# Patient Record
Sex: Female | Born: 1940 | Race: White | Hispanic: No | Smoking: Never smoker
Health system: Southern US, Community
[De-identification: ages and names within clinical notes are randomized; demographics above are authoritative.]

## PROBLEM LIST (undated history)

## (undated) DIAGNOSIS — M1990 Unspecified osteoarthritis, unspecified site: Secondary | ICD-10-CM

## (undated) DIAGNOSIS — R519 Headache, unspecified: Secondary | ICD-10-CM

## (undated) DIAGNOSIS — M519 Unspecified thoracic, thoracolumbar and lumbosacral intervertebral disc disorder: Secondary | ICD-10-CM

## (undated) DIAGNOSIS — M5432 Sciatica, left side: Secondary | ICD-10-CM

## (undated) DIAGNOSIS — E041 Nontoxic single thyroid nodule: Secondary | ICD-10-CM

## (undated) DIAGNOSIS — J309 Allergic rhinitis, unspecified: Secondary | ICD-10-CM

## (undated) DIAGNOSIS — Z87440 Personal history of urinary (tract) infections: Secondary | ICD-10-CM

## (undated) DIAGNOSIS — Z8042 Family history of malignant neoplasm of prostate: Secondary | ICD-10-CM

## (undated) DIAGNOSIS — R0600 Dyspnea, unspecified: Secondary | ICD-10-CM

## (undated) DIAGNOSIS — E785 Hyperlipidemia, unspecified: Secondary | ICD-10-CM

## (undated) DIAGNOSIS — Z803 Family history of malignant neoplasm of breast: Secondary | ICD-10-CM

## (undated) DIAGNOSIS — K589 Irritable bowel syndrome without diarrhea: Secondary | ICD-10-CM

## (undated) DIAGNOSIS — G2581 Restless legs syndrome: Secondary | ICD-10-CM

## (undated) DIAGNOSIS — IMO0002 Reserved for concepts with insufficient information to code with codable children: Secondary | ICD-10-CM

## (undated) DIAGNOSIS — I8392 Asymptomatic varicose veins of left lower extremity: Secondary | ICD-10-CM

## (undated) DIAGNOSIS — G8929 Other chronic pain: Secondary | ICD-10-CM

## (undated) DIAGNOSIS — Z808 Family history of malignant neoplasm of other organs or systems: Secondary | ICD-10-CM

## (undated) DIAGNOSIS — M419 Scoliosis, unspecified: Secondary | ICD-10-CM

## (undated) DIAGNOSIS — J45909 Unspecified asthma, uncomplicated: Secondary | ICD-10-CM

## (undated) DIAGNOSIS — C801 Malignant (primary) neoplasm, unspecified: Secondary | ICD-10-CM

## (undated) DIAGNOSIS — M5431 Sciatica, right side: Secondary | ICD-10-CM

## (undated) DIAGNOSIS — M5136 Other intervertebral disc degeneration, lumbar region: Secondary | ICD-10-CM

## (undated) DIAGNOSIS — R51 Headache: Secondary | ICD-10-CM

## (undated) DIAGNOSIS — Z9109 Other allergy status, other than to drugs and biological substances: Secondary | ICD-10-CM

## (undated) DIAGNOSIS — M810 Age-related osteoporosis without current pathological fracture: Secondary | ICD-10-CM

## (undated) DIAGNOSIS — R42 Dizziness and giddiness: Secondary | ICD-10-CM

## (undated) DIAGNOSIS — F419 Anxiety disorder, unspecified: Secondary | ICD-10-CM

## (undated) DIAGNOSIS — K219 Gastro-esophageal reflux disease without esophagitis: Secondary | ICD-10-CM

## (undated) DIAGNOSIS — J189 Pneumonia, unspecified organism: Secondary | ICD-10-CM

## (undated) DIAGNOSIS — J40 Bronchitis, not specified as acute or chronic: Secondary | ICD-10-CM

## (undated) DIAGNOSIS — N189 Chronic kidney disease, unspecified: Secondary | ICD-10-CM

## (undated) DIAGNOSIS — I10 Essential (primary) hypertension: Secondary | ICD-10-CM

## (undated) DIAGNOSIS — R1310 Dysphagia, unspecified: Secondary | ICD-10-CM

## (undated) DIAGNOSIS — M4800 Spinal stenosis, site unspecified: Secondary | ICD-10-CM

## (undated) DIAGNOSIS — Z972 Presence of dental prosthetic device (complete) (partial): Secondary | ICD-10-CM

## (undated) DIAGNOSIS — R7881 Bacteremia: Secondary | ICD-10-CM

## (undated) DIAGNOSIS — M51369 Other intervertebral disc degeneration, lumbar region without mention of lumbar back pain or lower extremity pain: Secondary | ICD-10-CM

## (undated) DIAGNOSIS — Z923 Personal history of irradiation: Secondary | ICD-10-CM

## (undated) DIAGNOSIS — K635 Polyp of colon: Secondary | ICD-10-CM

## (undated) DIAGNOSIS — B9620 Unspecified Escherichia coli [E. coli] as the cause of diseases classified elsewhere: Secondary | ICD-10-CM

## (undated) DIAGNOSIS — Z77120 Contact with and (suspected) exposure to mold (toxic): Secondary | ICD-10-CM

## (undated) DIAGNOSIS — D649 Anemia, unspecified: Secondary | ICD-10-CM

## (undated) DIAGNOSIS — E039 Hypothyroidism, unspecified: Secondary | ICD-10-CM

## (undated) DIAGNOSIS — Z800 Family history of malignant neoplasm of digestive organs: Secondary | ICD-10-CM

## (undated) DIAGNOSIS — M549 Dorsalgia, unspecified: Secondary | ICD-10-CM

## (undated) DIAGNOSIS — Z8041 Family history of malignant neoplasm of ovary: Secondary | ICD-10-CM

## (undated) HISTORY — DX: Unspecified osteoarthritis, unspecified site: M19.90

## (undated) HISTORY — DX: Other chronic pain: G89.29

## (undated) HISTORY — DX: Nontoxic single thyroid nodule: E04.1

## (undated) HISTORY — DX: Age-related osteoporosis without current pathological fracture: M81.0

## (undated) HISTORY — DX: Family history of malignant neoplasm of ovary: Z80.41

## (undated) HISTORY — PX: BREAST CYST ASPIRATION: SHX578

## (undated) HISTORY — DX: Essential (primary) hypertension: I10

## (undated) HISTORY — PX: CARDIOVASCULAR STRESS TEST: SHX262

## (undated) HISTORY — DX: Family history of malignant neoplasm of other organs or systems: Z80.8

## (undated) HISTORY — DX: Reserved for concepts with insufficient information to code with codable children: IMO0002

## (undated) HISTORY — DX: Gastro-esophageal reflux disease without esophagitis: K21.9

## (undated) HISTORY — PX: BIOPSY THYROID: PRO38

## (undated) HISTORY — DX: Polyp of colon: K63.5

## (undated) HISTORY — DX: Family history of malignant neoplasm of prostate: Z80.42

## (undated) HISTORY — PX: OVARIAN CYST SURGERY: SHX726

## (undated) HISTORY — DX: Presence of dental prosthetic device (complete) (partial): Z97.2

## (undated) HISTORY — DX: Dorsalgia, unspecified: M54.9

## (undated) HISTORY — PX: TOTAL THYROIDECTOMY: SHX2547

## (undated) HISTORY — PX: APPENDECTOMY: SHX54

## (undated) HISTORY — DX: Allergic rhinitis, unspecified: J30.9

## (undated) HISTORY — PX: EYE SURGERY: SHX253

## (undated) HISTORY — DX: Personal history of urinary (tract) infections: Z87.440

## (undated) HISTORY — DX: Family history of malignant neoplasm of breast: Z80.3

## (undated) HISTORY — PX: BACK SURGERY: SHX140

## (undated) HISTORY — DX: Unspecified Escherichia coli (E. coli) as the cause of diseases classified elsewhere: B96.20

## (undated) HISTORY — DX: Irritable bowel syndrome, unspecified: K58.9

## (undated) HISTORY — DX: Unspecified asthma, uncomplicated: J45.909

## (undated) HISTORY — DX: Hyperlipidemia, unspecified: E78.5

## (undated) HISTORY — DX: Dizziness and giddiness: R42

## (undated) HISTORY — DX: Unspecified thoracic, thoracolumbar and lumbosacral intervertebral disc disorder: M51.9

## (undated) HISTORY — DX: Family history of malignant neoplasm of digestive organs: Z80.0

## (undated) HISTORY — DX: Bronchitis, not specified as acute or chronic: J40

## (undated) HISTORY — DX: Bacteremia: R78.81

---

## 1975-03-18 HISTORY — PX: ABDOMINAL HYSTERECTOMY: SHX81

## 1976-03-17 DIAGNOSIS — J189 Pneumonia, unspecified organism: Secondary | ICD-10-CM

## 1976-03-17 HISTORY — DX: Pneumonia, unspecified organism: J18.9

## 1992-03-17 HISTORY — PX: OTHER SURGICAL HISTORY: SHX169

## 2000-03-17 DIAGNOSIS — Z77120 Contact with and (suspected) exposure to mold (toxic): Secondary | ICD-10-CM

## 2000-03-17 HISTORY — DX: Contact with and (suspected) exposure to mold (toxic): Z77.120

## 2001-03-17 HISTORY — PX: ANKLE FRACTURE SURGERY: SHX122

## 2004-06-20 ENCOUNTER — Ambulatory Visit: Payer: Self-pay | Admitting: Unknown Physician Specialty

## 2005-07-24 ENCOUNTER — Ambulatory Visit: Payer: Self-pay | Admitting: Unknown Physician Specialty

## 2005-08-18 ENCOUNTER — Ambulatory Visit: Payer: Self-pay | Admitting: Gastroenterology

## 2006-08-13 ENCOUNTER — Ambulatory Visit: Payer: Self-pay | Admitting: Unknown Physician Specialty

## 2006-10-20 ENCOUNTER — Ambulatory Visit: Payer: Self-pay | Admitting: Internal Medicine

## 2006-12-23 ENCOUNTER — Ambulatory Visit: Payer: Self-pay | Admitting: Internal Medicine

## 2007-01-06 ENCOUNTER — Ambulatory Visit: Payer: Self-pay | Admitting: Physician Assistant

## 2007-01-14 ENCOUNTER — Ambulatory Visit: Payer: Self-pay | Admitting: Anesthesiology

## 2007-02-24 ENCOUNTER — Ambulatory Visit: Payer: Self-pay | Admitting: Anesthesiology

## 2007-04-13 ENCOUNTER — Ambulatory Visit: Payer: Self-pay | Admitting: Anesthesiology

## 2007-04-20 ENCOUNTER — Encounter: Payer: Self-pay | Admitting: General Practice

## 2007-05-26 ENCOUNTER — Ambulatory Visit: Payer: Self-pay | Admitting: Anesthesiology

## 2007-05-27 ENCOUNTER — Ambulatory Visit: Payer: Self-pay | Admitting: Unknown Physician Specialty

## 2007-08-02 ENCOUNTER — Ambulatory Visit: Payer: Self-pay | Admitting: Anesthesiology

## 2007-08-16 ENCOUNTER — Ambulatory Visit: Payer: Self-pay | Admitting: Unknown Physician Specialty

## 2007-10-07 ENCOUNTER — Ambulatory Visit: Payer: Self-pay | Admitting: Unknown Physician Specialty

## 2008-03-17 HISTORY — PX: TRANSTHORACIC ECHOCARDIOGRAM: SHX275

## 2008-03-17 HISTORY — PX: COLONOSCOPY: SHX174

## 2008-03-17 HISTORY — PX: OTHER SURGICAL HISTORY: SHX169

## 2008-07-20 ENCOUNTER — Ambulatory Visit: Payer: Self-pay | Admitting: Unknown Physician Specialty

## 2008-08-02 ENCOUNTER — Ambulatory Visit: Payer: Self-pay | Admitting: Unknown Physician Specialty

## 2008-08-24 ENCOUNTER — Ambulatory Visit: Payer: Self-pay | Admitting: Unknown Physician Specialty

## 2009-06-28 ENCOUNTER — Ambulatory Visit: Payer: Self-pay | Admitting: Unknown Physician Specialty

## 2009-08-20 ENCOUNTER — Ambulatory Visit (HOSPITAL_COMMUNITY): Admission: RE | Admit: 2009-08-20 | Discharge: 2009-08-21 | Payer: Self-pay | Admitting: Surgery

## 2009-08-20 ENCOUNTER — Encounter (INDEPENDENT_AMBULATORY_CARE_PROVIDER_SITE_OTHER): Payer: Self-pay | Admitting: Surgery

## 2009-10-25 ENCOUNTER — Ambulatory Visit: Payer: Self-pay | Admitting: Unknown Physician Specialty

## 2009-11-05 ENCOUNTER — Ambulatory Visit: Payer: Self-pay | Admitting: Internal Medicine

## 2009-11-13 ENCOUNTER — Ambulatory Visit: Payer: Self-pay | Admitting: Internal Medicine

## 2010-06-03 LAB — CBC
HCT: 42.3 % (ref 36.0–46.0)
Hemoglobin: 14.1 g/dL (ref 12.0–15.0)
MCHC: 33.4 g/dL (ref 30.0–36.0)
MCV: 88.1 fL (ref 78.0–100.0)
Platelets: 180 10*3/uL (ref 150–400)
RBC: 4.8 MIL/uL (ref 3.87–5.11)
RDW: 13.9 % (ref 11.5–15.5)
WBC: 5.6 10*3/uL (ref 4.0–10.5)

## 2010-06-03 LAB — URINALYSIS, ROUTINE W REFLEX MICROSCOPIC
Bilirubin Urine: NEGATIVE
Glucose, UA: NEGATIVE mg/dL
Hgb urine dipstick: NEGATIVE
Ketones, ur: NEGATIVE mg/dL
Nitrite: NEGATIVE
Protein, ur: NEGATIVE mg/dL
Specific Gravity, Urine: 1.012 (ref 1.005–1.030)
Urobilinogen, UA: 0.2 mg/dL (ref 0.0–1.0)
pH: 6 (ref 5.0–8.0)

## 2010-06-03 LAB — DIFFERENTIAL
Basophils Absolute: 0.1 10*3/uL (ref 0.0–0.1)
Basophils Relative: 1 % (ref 0–1)
Eosinophils Absolute: 0.2 10*3/uL (ref 0.0–0.7)
Eosinophils Relative: 3 % (ref 0–5)
Lymphocytes Relative: 30 % (ref 12–46)
Lymphs Abs: 1.7 10*3/uL (ref 0.7–4.0)
Monocytes Absolute: 0.5 10*3/uL (ref 0.1–1.0)
Monocytes Relative: 9 % (ref 3–12)
Neutro Abs: 3.2 10*3/uL (ref 1.7–7.7)
Neutrophils Relative %: 57 % (ref 43–77)

## 2010-06-03 LAB — BASIC METABOLIC PANEL
BUN: 19 mg/dL (ref 6–23)
CO2: 28 mEq/L (ref 19–32)
Calcium: 9.4 mg/dL (ref 8.4–10.5)
Chloride: 109 mEq/L (ref 96–112)
Creatinine, Ser: 0.83 mg/dL (ref 0.4–1.2)
GFR calc Af Amer: >60 mL/min (ref >60)
GFR calc non Af Amer: >60 mL/min (ref >60)
Glucose, Bld: 91 mg/dL (ref 70–99)
Potassium: 3.9 mEq/L (ref 3.5–5.1)
Sodium: 144 mEq/L (ref 135–145)

## 2010-06-03 LAB — PROTIME-INR
INR: 1.11 (ref 0.00–1.49)
Prothrombin Time: 14.2 seconds (ref 11.6–15.2)

## 2010-06-03 LAB — CALCIUM
Calcium: 8.3 mg/dL — ABNORMAL LOW (ref 8.4–10.5)
Calcium: 8.5 mg/dL (ref 8.4–10.5)

## 2010-07-05 ENCOUNTER — Ambulatory Visit: Payer: Self-pay | Admitting: Internal Medicine

## 2010-07-16 ENCOUNTER — Ambulatory Visit: Payer: Self-pay | Admitting: Internal Medicine

## 2010-08-22 ENCOUNTER — Encounter (INDEPENDENT_AMBULATORY_CARE_PROVIDER_SITE_OTHER): Payer: Self-pay | Admitting: Surgery

## 2010-10-31 ENCOUNTER — Ambulatory Visit: Payer: Self-pay | Admitting: Unknown Physician Specialty

## 2010-11-04 ENCOUNTER — Ambulatory Visit: Payer: Self-pay | Admitting: Internal Medicine

## 2010-12-05 ENCOUNTER — Ambulatory Visit: Payer: Self-pay | Admitting: Internal Medicine

## 2011-03-12 ENCOUNTER — Ambulatory Visit: Payer: Self-pay | Admitting: Internal Medicine

## 2011-05-01 ENCOUNTER — Encounter (INDEPENDENT_AMBULATORY_CARE_PROVIDER_SITE_OTHER): Payer: Self-pay | Admitting: Surgery

## 2011-08-13 ENCOUNTER — Ambulatory Visit: Payer: Self-pay | Admitting: Unknown Physician Specialty

## 2011-10-30 ENCOUNTER — Other Ambulatory Visit: Payer: Self-pay | Admitting: Neurosurgery

## 2011-11-04 ENCOUNTER — Encounter (HOSPITAL_COMMUNITY): Payer: Self-pay | Admitting: Respiratory Therapy

## 2011-11-07 ENCOUNTER — Ambulatory Visit: Payer: Self-pay | Admitting: Unknown Physician Specialty

## 2011-11-12 ENCOUNTER — Encounter (HOSPITAL_COMMUNITY)
Admission: RE | Admit: 2011-11-12 | Discharge: 2011-11-12 | Disposition: A | Discharge status: Home / Self Care | Payer: Medicare Other | Source: Ambulatory Visit | Attending: Neurosurgery | Admitting: Neurosurgery

## 2011-11-12 ENCOUNTER — Encounter (HOSPITAL_COMMUNITY): Payer: Self-pay

## 2011-11-12 HISTORY — DX: Hypothyroidism, unspecified: E03.9

## 2011-11-12 HISTORY — DX: Malignant (primary) neoplasm, unspecified: C80.1

## 2011-11-12 LAB — CBC
HCT: 39.9 % (ref 36.0–46.0)
Hemoglobin: 13.2 g/dL (ref 12.0–15.0)
MCH: 30.6 pg (ref 26.0–34.0)
MCHC: 33.1 g/dL (ref 30.0–36.0)
MCV: 92.6 fL (ref 78.0–100.0)
Platelets: 178 10*3/uL (ref 150–400)
RBC: 4.31 MIL/uL (ref 3.87–5.11)
RDW: 14.1 % (ref 11.5–15.5)
WBC: 6.9 10*3/uL (ref 4.0–10.5)

## 2011-11-12 LAB — BASIC METABOLIC PANEL
BUN: 17 mg/dL (ref 6–23)
CO2: 27 mEq/L (ref 19–32)
Calcium: 9.5 mg/dL (ref 8.4–10.5)
Chloride: 107 mEq/L (ref 96–112)
Creatinine, Ser: 0.83 mg/dL (ref 0.50–1.10)
GFR calc Af Amer: 82 mL/min — ABNORMAL LOW (ref >90)
GFR calc non Af Amer: 70 mL/min — ABNORMAL LOW (ref >90)
Glucose, Bld: 81 mg/dL (ref 70–99)
Potassium: 3.5 mEq/L (ref 3.5–5.1)
Sodium: 142 mEq/L (ref 135–145)

## 2011-11-12 LAB — SURGICAL PCR SCREEN
MRSA, PCR: NEGATIVE
Staphylococcus aureus: POSITIVE — AB

## 2011-11-12 NOTE — Pre-Procedure Instructions (Signed)
20 LAIBA FUERTE  11/12/2011   Your procedure is scheduled on:  Tuesday September 3  Report to [facility] Stay [facility] at 6:45 AM.  Call this number if you have problems the morning of surgery: [phone]   Remember:   Do not eat or drink:After Midnight.    Take these medicines the morning of surgery with A SIP OF WATER: Amlodipine (Norvasc), Cymbalta (duloxetine), Synthroid (levothyroxine), Robaxin (methocarbamol), Priloxec (omeprazole).   Do not wear jewelry, make-up or nail polish.  Do not wear lotions, powders, or perfumes. You may wear deodorant.  Do not shave 48 hours prior to surgery. Men may shave face and neck.  Do not bring valuables to the hospital.  Contacts, dentures or bridgework may not be worn into surgery.  Leave suitcase in the car. After surgery it may be brought to your room.  For patients admitted to the hospital, checkout time is 11:00 AM the day of discharge.   Patients discharged the day of surgery will not be allowed to drive home.  Name and phone number of your driver: NA  Special Instructions: CHG Shower Use Special Wash: 1/2 bottle night before surgery and 1/2 bottle morning of surgery.   Please read over the following fact sheets that you were given: Pain Booklet, Coughing and Deep Breathing and Surgical Site Infection Prevention

## 2011-11-16 NOTE — H&P (Signed)
NEUROSURGICAL CONSULTATION   Latoya Stevenson   DOB:  08-21-1940 #[id number]    October 30, 2011   HISTORY:     Latoya Stevenson is a 71 year old retired woman who says that she retired as of July of this year because of low back problems, who presents with low back pain and bilateral lower extremity pain.  She currently describes low back pain ranging from 5 to 7/10 in severity.  She notes that it goes into both of her legs.  She has the most pain in the mornings, but she says she has pain in both of her feet and she says that she has been through physical therapy three times without any sustained relief. She had a Worker's Comp injury in 2001 when she was thrown from a machine and had epidural steroid injections, and had an additional work-related injury in 2008 and also had epidural steroid injections and physical therapy.  She has recently had three epidural steroid injections and left hip injection scheduled for Monday.  She currently says that both legs bother her and they are achy and the pain alternates between the two legs.  She has been taking Nabumetone 500 mg. twice daily, Tylenol Arthritis 650 mg. two twice daily, Robaxin 500 mg. twice daily, Cymbalta 30 mg. daily. She is limited in terms of walking any distance and says that she gets cramps in her legs when she tries to do so.    REVIEW OF SYSTEMS:   A detailed Review of Systems sheet was reviewed with the patient.  Pertinent positives include under cardiovascular - she notes high blood pressure, high cholesterol, leg pain with walking, under musculoskeletal - she notes back pain, leg pain, joint pain and swelling, arthritis, under endocrine - she notes thyroid disease.  All other systems are negative; this includes Constitutional symptoms, Eyes, Ears, nose, mouth, throat, Respiratory, Gastrointestinal, Genitourinary, Integumentary & Breast, Neurologic, Psychiatric, Hematologic/Lymphatic, Allergic/Immunologic.    PAST MEDICAL HISTORY:      Current  Medical Conditions:    She has a history of hypertension, hypothyroidism, elevated cholesterol, and thyroid cancer.      Prior Operations and Hospitalizations:   She had a hysterectomy in 1977, thyroid surgery in 2011 and in the 1960s and 1970s she had surgery for ovarian cysts.      Medications and Allergies:  Current medications - Nabumetone 500 mg. b.i.d., Fluticasone qd, Omeprazole 20 mg. qd, Amlodipine 5 mg. qd, Atorvastatin 40 mg. qd, Irbesartan 300 mg. qd, Synthroid 137 mcg. qd, Allegra 180 mg. qd, Methocarbamol 500 mg. b.i.d., Cymbalta 30 mg. qd and 60 mg. next week, Calcium 1200 mg. qd, Glucosamine, and Vitamin E 400.  MEDICATION ALLERGIES - MACRODANTIN, PENICILLIN, FLAGYL, AND CELEBREX.      Height and Weight:     She is currently 5'4" tall, 170 lbs.    FAMILY HISTORY:    Both parents are deceased.  Her mother died at age 87 of cancer.  Her father died at age 9, cause unspecified.    SOCIAL HISTORY:    She denies tobacco, alcohol or drug use.    DIAGNOSTIC STUDIES:   She had an MRI of her lumbar spine which was performed through [facility] on 08/13/2011 which shows lumbar stenosis at L4-5 and L5-S1 levels.  Of note, she has a preserved S1-S2 disc space.    She has mild dextroconvex scoliosis on lumbar radiographs without spondylolisthesis and sacralized L5 vertebral body.    PHYSICAL EXAMINATION:  General Appearance:   On examination today, Latoya Stevenson is a pleasant and cooperative woman in no acute distress.      Blood Pressure, Pulse:     Her blood pressure is 140/84.  Heart rate is 76 and regular.  Respiratory rate is 18.      HEENT - normocephalic, atraumatic.  The pupils are equal, round and reactive to light.  The extraocular muscles are intact.  Sclerae - white.  Conjunctiva - pink.  Oropharynx benign.  Uvula midline.     Neck - there are no masses, meningismus, deformities, tracheal deviation, jugular vein distention or carotid bruits.  There is  normal cervical range of motion.  Spurlings' test is negative without reproducible radicular pain turning the patient's head to either side.  Lhermitte's sign is not present with axial compression.      Respiratory - there is normal respiratory effort with good intercostal function.  Lungs are clear to auscultation.  There are no rales, rhonchi or wheezes.      Cardiovascular - the heart has regular rate and rhythm to auscultation.  No murmurs are appreciated.  There is no extremity edema, cyanosis or clubbing.  There are palpable pedal pulses.      Abdomen - soft, nontender, no hepatosplenomegaly appreciated or masses.  There are active bowel sounds.  No guarding or rebound.      Musculoskeletal Examination - she has bunions on both great toes and significant arthritic changes in both hands.  She has pain at the lumbosacral junction to palpation and bilateral sciatic notch discomfort to palpation. She is able to walk on heels and toes. She is able to bend to touch her toes. She has a negative straight leg raise and negative Patrick's test bilaterally.    NEUROLOGICAL EXAMINATION: The patient is oriented to time, person and place and has good recall of both recent and remote memory with normal attention span and concentration.  The patient speaks with clear and fluent speech and exhibits normal language function and appropriate fund of knowledge.      Cranial Nerve Examination - pupils are equal, round and reactive to light.  Extraocular movements are full.  Visual fields are full to confrontational testing.  Facial sensation and facial movement are symmetric and intact.  Hearing is intact to finger rub.  Palate is upgoing.  Shoulder shrug is symmetric.  Tongue protrudes in the midline.      Motor Examination - motor strength is 5/5 in the bilateral deltoids, biceps, triceps, handgrips, wrist extensors, interosseous.  In the lower extremities motor strength is 5/5 in hip flexion, extension, quadriceps,  hamstrings, plantar flexion, dorsiflexion and extensor hallucis longus.      Sensory Examination - normal to light touch and pinprick sensation in the upper and lower extremities.     Deep Tendon Reflexes - 1 in the biceps, triceps, and brachioradialis, 1 in the knees, 1 in the ankles.  The great toes are downgoing to plantar stimulation.      Cerebellar Examination - normal coordination in upper and lower extremities and normal rapid alternating movements.  Romberg test is negative.    IMPRESSION AND RECOMMENDATIONS: Latoya Stevenson is a 71 year old woman with significant low back pain and lumbar stenosis with neurogenic claudication. She has lumbar stenosis most marked at the L3-4 and L4-5 levels.  I talked with her about treatment options.  She has already engaged in multiple courses of physical therapy without a great deal of relief and also has had injections,  and a variety of other treatments including greater than six weeks of anti-inflammatory medications, and despite these interventions she is no better. She wishes to go ahead with surgery. This will consist of an L3 through L5 decompressive lumbar laminectomy for spinal stenosis.  Risks and benefits were discussed in detail with the patient and she wishes to proceed.    [facility]    Danae Orleans. Venetia Maxon, M.D.  JDS:aft cc: Dr. Merri Ray   Dr. Silver Huguenin

## 2011-11-17 MED ORDER — VANCOMYCIN HCL IN DEXTROSE 1-5 GM/200ML-% IV SOLN
1000.0000 mg | INTRAVENOUS | Status: AC
  Administered 2011-11-18: 1000 mg via INTRAVENOUS
  Filled 2011-11-17: qty 200

## 2011-11-18 ENCOUNTER — Encounter (HOSPITAL_COMMUNITY): Payer: Self-pay | Admitting: Certified Registered"

## 2011-11-18 ENCOUNTER — Inpatient Hospital Stay (HOSPITAL_COMMUNITY)
Admission: RE | Admit: 2011-11-18 | Discharge: 2011-11-19 | DRG: 491 | Disposition: A | Discharge status: Home / Self Care | Payer: Medicare Other | Source: Ambulatory Visit | Attending: Neurosurgery | Admitting: Neurosurgery

## 2011-11-18 ENCOUNTER — Inpatient Hospital Stay (HOSPITAL_COMMUNITY): Payer: Medicare Other | Admitting: Certified Registered"

## 2011-11-18 ENCOUNTER — Inpatient Hospital Stay (HOSPITAL_COMMUNITY): Payer: Medicare Other

## 2011-11-18 ENCOUNTER — Encounter (HOSPITAL_COMMUNITY)
Admission: RE | Disposition: A | Discharge status: Home / Self Care | Payer: Self-pay | Source: Ambulatory Visit | Attending: Neurosurgery

## 2011-11-18 DIAGNOSIS — I10 Essential (primary) hypertension: Secondary | ICD-10-CM | POA: Diagnosis present

## 2011-11-18 DIAGNOSIS — Z79899 Other long term (current) drug therapy: Secondary | ICD-10-CM

## 2011-11-18 DIAGNOSIS — E7800 Pure hypercholesterolemia, unspecified: Secondary | ICD-10-CM | POA: Diagnosis present

## 2011-11-18 DIAGNOSIS — Z01812 Encounter for preprocedural laboratory examination: Secondary | ICD-10-CM

## 2011-11-18 DIAGNOSIS — M51379 Other intervertebral disc degeneration, lumbosacral region without mention of lumbar back pain or lower extremity pain: Secondary | ICD-10-CM | POA: Diagnosis present

## 2011-11-18 DIAGNOSIS — M5137 Other intervertebral disc degeneration, lumbosacral region: Secondary | ICD-10-CM | POA: Diagnosis present

## 2011-11-18 DIAGNOSIS — Z85850 Personal history of malignant neoplasm of thyroid: Secondary | ICD-10-CM

## 2011-11-18 DIAGNOSIS — E039 Hypothyroidism, unspecified: Secondary | ICD-10-CM | POA: Diagnosis present

## 2011-11-18 DIAGNOSIS — M48062 Spinal stenosis, lumbar region with neurogenic claudication: Secondary | ICD-10-CM | POA: Diagnosis present

## 2011-11-18 DIAGNOSIS — M47817 Spondylosis without myelopathy or radiculopathy, lumbosacral region: Principal | ICD-10-CM | POA: Diagnosis present

## 2011-11-18 HISTORY — PX: LUMBAR LAMINECTOMY/DECOMPRESSION MICRODISCECTOMY: SHX5026

## 2011-11-18 SURGERY — LUMBAR LAMINECTOMY/DECOMPRESSION MICRODISCECTOMY 2 LEVELS
Anesthesia: General | Wound class: Clean

## 2011-11-18 MED ORDER — LEVOTHYROXINE SODIUM 137 MCG PO TABS: 205.5000 ug | ORAL_TABLET | ORAL | Status: DC

## 2011-11-18 MED ORDER — PHENOL 1.4 % MT LIQD: 1.0000 | OROMUCOSAL | Status: DC | PRN

## 2011-11-18 MED ORDER — OXYCODONE-ACETAMINOPHEN 5-325 MG PO TABS
1.0000 | ORAL_TABLET | ORAL | Status: DC | PRN
  Administered 2011-11-18 – 2011-11-19 (×3): 2 via ORAL
  Filled 2011-11-18 (×3): qty 2

## 2011-11-18 MED ORDER — FLUTICASONE PROPIONATE 50 MCG/ACT NA SUSP
2.0000 | Freq: Every day | NASAL | Status: DC
  Filled 2011-11-18: qty 16

## 2011-11-18 MED ORDER — LEVOTHYROXINE SODIUM 137 MCG PO TABS: 137.0000 ug | ORAL_TABLET | ORAL | Status: DC

## 2011-11-18 MED ORDER — 0.9 % SODIUM CHLORIDE (POUR BTL) OPTIME
TOPICAL | Status: DC | PRN
  Administered 2011-11-18: 1000 mL

## 2011-11-18 MED ORDER — SODIUM CHLORIDE 0.9 % IR SOLN
Status: DC | PRN
  Administered 2011-11-18: 12:00:00

## 2011-11-18 MED ORDER — SENNA 8.6 MG PO TABS
1.0000 | ORAL_TABLET | Freq: Two times a day (BID) | ORAL | Status: DC
  Filled 2011-11-18 (×2): qty 1

## 2011-11-18 MED ORDER — DIPHENHYDRAMINE HCL (SLEEP) 25 MG PO TABS: 50.0000 mg | ORAL_TABLET | Freq: Every evening | ORAL | Status: DC | PRN

## 2011-11-18 MED ORDER — BUPIVACAINE HCL (PF) 0.25 % IJ SOLN
INTRAMUSCULAR | Status: DC | PRN
  Administered 2011-11-18: 5 mL

## 2011-11-18 MED ORDER — LEVOTHYROXINE SODIUM 137 MCG PO TABS: 137.0000 ug | ORAL_TABLET | Freq: Every day | ORAL | Status: DC

## 2011-11-18 MED ORDER — ACETAMINOPHEN 325 MG PO TABS: 650.0000 mg | ORAL_TABLET | ORAL | Status: DC | PRN

## 2011-11-18 MED ORDER — ZOLPIDEM TARTRATE 5 MG PO TABS: 5.0000 mg | ORAL_TABLET | Freq: Every evening | ORAL | Status: DC | PRN

## 2011-11-18 MED ORDER — ACETAMINOPHEN 325 MG PO TABS: 650.0000 mg | ORAL_TABLET | Freq: Four times a day (QID) | ORAL | Status: DC | PRN

## 2011-11-18 MED ORDER — MENTHOL 3 MG MT LOZG: 1.0000 | LOZENGE | OROMUCOSAL | Status: DC | PRN

## 2011-11-18 MED ORDER — GLYCOPYRROLATE 0.2 MG/ML IJ SOLN
INTRAMUSCULAR | Status: DC | PRN
  Administered 2011-11-18: 0.4 mg via INTRAVENOUS

## 2011-11-18 MED ORDER — HYDROCODONE-ACETAMINOPHEN 5-325 MG PO TABS: 1.0000 | ORAL_TABLET | ORAL | Status: DC | PRN

## 2011-11-18 MED ORDER — LACTATED RINGERS IV SOLN
INTRAVENOUS | Status: DC | PRN
  Administered 2011-11-18 (×2): via INTRAVENOUS

## 2011-11-18 MED ORDER — DIAZEPAM 5 MG PO TABS
5.0000 mg | ORAL_TABLET | Freq: Four times a day (QID) | ORAL | Status: DC | PRN
  Administered 2011-11-18: 5 mg via ORAL
  Filled 2011-11-18 (×2): qty 1

## 2011-11-18 MED ORDER — SODIUM CHLORIDE 0.9 % IJ SOLN
3.0000 mL | Freq: Two times a day (BID) | INTRAMUSCULAR | Status: DC
  Administered 2011-11-18 – 2011-11-19 (×3): 3 mL via INTRAVENOUS

## 2011-11-18 MED ORDER — IRBESARTAN 300 MG PO TABS
300.0000 mg | ORAL_TABLET | Freq: Every day | ORAL | Status: DC
  Filled 2011-11-18 (×2): qty 1

## 2011-11-18 MED ORDER — VITAMIN E 180 MG (400 UNIT) PO CAPS
400.0000 [IU] | ORAL_CAPSULE | Freq: Every day | ORAL | Status: DC
  Administered 2011-11-18 – 2011-11-19 (×2): 400 [IU] via ORAL
  Filled 2011-11-18 (×2): qty 1

## 2011-11-18 MED ORDER — ADULT MULTIVITAMIN W/MINERALS CH
1.0000 | ORAL_TABLET | Freq: Every day | ORAL | Status: DC
  Administered 2011-11-18 – 2011-11-19 (×2): 1 via ORAL
  Filled 2011-11-18 (×2): qty 1

## 2011-11-18 MED ORDER — METHOCARBAMOL 500 MG PO TABS
500.0000 mg | ORAL_TABLET | Freq: Every morning | ORAL | Status: DC
  Administered 2011-11-18 – 2011-11-19 (×2): 500 mg via ORAL
  Filled 2011-11-18 (×3): qty 1

## 2011-11-18 MED ORDER — PANTOPRAZOLE SODIUM 40 MG IV SOLR
40.0000 mg | Freq: Every day | INTRAVENOUS | Status: DC
  Filled 2011-11-18: qty 40

## 2011-11-18 MED ORDER — HYDROMORPHONE HCL PF 1 MG/ML IJ SOLN
INTRAMUSCULAR | Status: AC
  Filled 2011-11-18: qty 1

## 2011-11-18 MED ORDER — LEVOTHYROXINE SODIUM 137 MCG PO TABS
205.5000 ug | ORAL_TABLET | ORAL | Status: DC
  Administered 2011-11-19: 205.5 ug via ORAL
  Filled 2011-11-18: qty 1.5

## 2011-11-18 MED ORDER — DIPHENHYDRAMINE HCL 25 MG PO CAPS: 50.0000 mg | ORAL_CAPSULE | Freq: Every evening | ORAL | Status: DC | PRN

## 2011-11-18 MED ORDER — LIDOCAINE-EPINEPHRINE 1 %-1:100000 IJ SOLN
INTRAMUSCULAR | Status: DC | PRN
  Administered 2011-11-18: 5 mL

## 2011-11-18 MED ORDER — KCL IN DEXTROSE-NACL 20-5-0.45 MEQ/L-%-% IV SOLN
INTRAVENOUS | Status: DC
  Filled 2011-11-18 (×3): qty 1000

## 2011-11-18 MED ORDER — SODIUM CHLORIDE 0.9 % IJ SOLN: 3.0000 mL | INTRAMUSCULAR | Status: DC | PRN

## 2011-11-18 MED ORDER — DULOXETINE HCL 60 MG PO CPEP
60.0000 mg | ORAL_CAPSULE | Freq: Every day | ORAL | Status: DC
  Administered 2011-11-19: 60 mg via ORAL
  Filled 2011-11-18: qty 1

## 2011-11-18 MED ORDER — ROCURONIUM BROMIDE 100 MG/10ML IV SOLN
INTRAVENOUS | Status: DC | PRN
  Administered 2011-11-18: 50 mg via INTRAVENOUS

## 2011-11-18 MED ORDER — PHENYLEPHRINE HCL 10 MG/ML IJ SOLN
INTRAMUSCULAR | Status: DC | PRN
  Administered 2011-11-18: 40 ug via INTRAVENOUS
  Administered 2011-11-18 (×2): 80 ug via INTRAVENOUS
  Administered 2011-11-18: 40 ug via INTRAVENOUS
  Administered 2011-11-18 (×2): 80 ug via INTRAVENOUS

## 2011-11-18 MED ORDER — PROPOFOL 10 MG/ML IV EMUL
INTRAVENOUS | Status: DC | PRN
  Administered 2011-11-18: 150 mg via INTRAVENOUS

## 2011-11-18 MED ORDER — MORPHINE SULFATE 2 MG/ML IJ SOLN
1.0000 mg | INTRAMUSCULAR | Status: DC | PRN
  Administered 2011-11-18 – 2011-11-19 (×3): 2 mg via INTRAVENOUS
  Filled 2011-11-18 (×3): qty 1

## 2011-11-18 MED ORDER — DOCUSATE SODIUM 100 MG PO CAPS
100.0000 mg | ORAL_CAPSULE | Freq: Two times a day (BID) | ORAL | Status: DC
  Administered 2011-11-18 (×2): 100 mg via ORAL
  Filled 2011-11-18 (×2): qty 1

## 2011-11-18 MED ORDER — SODIUM CHLORIDE 0.9 % IV SOLN
INTRAVENOUS | Status: AC
  Filled 2011-11-18: qty 500

## 2011-11-18 MED ORDER — ONDANSETRON HCL 4 MG/2ML IJ SOLN
INTRAMUSCULAR | Status: DC | PRN
  Administered 2011-11-18: 4 mg via INTRAVENOUS

## 2011-11-18 MED ORDER — ACETAMINOPHEN 650 MG RE SUPP: 650.0000 mg | RECTAL | Status: DC | PRN

## 2011-11-18 MED ORDER — THROMBIN 5000 UNITS EX SOLR
CUTANEOUS | Status: DC | PRN
  Administered 2011-11-18 (×2): 5000 [IU] via TOPICAL

## 2011-11-18 MED ORDER — ATORVASTATIN CALCIUM 40 MG PO TABS
40.0000 mg | ORAL_TABLET | Freq: Every day | ORAL | Status: DC
  Administered 2011-11-18: 40 mg via ORAL
  Filled 2011-11-18 (×2): qty 1

## 2011-11-18 MED ORDER — LORATADINE 10 MG PO TABS
10.0000 mg | ORAL_TABLET | Freq: Every day | ORAL | Status: DC
  Administered 2011-11-18 – 2011-11-19 (×2): 10 mg via ORAL
  Filled 2011-11-18 (×2): qty 1

## 2011-11-18 MED ORDER — PANTOPRAZOLE SODIUM 40 MG PO TBEC
40.0000 mg | DELAYED_RELEASE_TABLET | Freq: Every day | ORAL | Status: DC
  Administered 2011-11-19: 40 mg via ORAL
  Filled 2011-11-18: qty 1

## 2011-11-18 MED ORDER — ONDANSETRON HCL 4 MG/2ML IJ SOLN: 4.0000 mg | INTRAMUSCULAR | Status: DC | PRN

## 2011-11-18 MED ORDER — AMLODIPINE BESYLATE 5 MG PO TABS
5.0000 mg | ORAL_TABLET | Freq: Every day | ORAL | Status: DC
  Administered 2011-11-19: 5 mg via ORAL
  Filled 2011-11-18: qty 1

## 2011-11-18 MED ORDER — VITAMIN D 1000 UNITS PO TABS
5000.0000 [IU] | ORAL_TABLET | Freq: Every day | ORAL | Status: DC
  Administered 2011-11-19: 5000 [IU] via ORAL
  Filled 2011-11-18: qty 5

## 2011-11-18 MED ORDER — LIDOCAINE HCL (CARDIAC) 20 MG/ML IV SOLN
INTRAVENOUS | Status: DC | PRN
  Administered 2011-11-18: 70 mg via INTRAVENOUS

## 2011-11-18 MED ORDER — HYDROMORPHONE HCL PF 1 MG/ML IJ SOLN
0.2500 mg | INTRAMUSCULAR | Status: DC | PRN
  Administered 2011-11-18 (×4): 0.25 mg via INTRAVENOUS

## 2011-11-18 MED ORDER — NABUMETONE 500 MG PO TABS
500.0000 mg | ORAL_TABLET | Freq: Two times a day (BID) | ORAL | Status: DC
  Administered 2011-11-18 – 2011-11-19 (×3): 500 mg via ORAL
  Filled 2011-11-18 (×4): qty 1

## 2011-11-18 MED ORDER — MIDAZOLAM HCL 5 MG/5ML IJ SOLN
INTRAMUSCULAR | Status: DC | PRN
  Administered 2011-11-18: 2 mg via INTRAVENOUS

## 2011-11-18 MED ORDER — BACITRACIN 50000 UNITS IM SOLR
INTRAMUSCULAR | Status: AC
  Filled 2011-11-18: qty 1

## 2011-11-18 MED ORDER — HEMOSTATIC AGENTS (NO CHARGE) OPTIME
TOPICAL | Status: DC | PRN
  Administered 2011-11-18: 1 via TOPICAL

## 2011-11-18 MED ORDER — FENTANYL CITRATE 0.05 MG/ML IJ SOLN
INTRAMUSCULAR | Status: DC | PRN
  Administered 2011-11-18: 100 ug via INTRAVENOUS

## 2011-11-18 MED ORDER — NEOSTIGMINE METHYLSULFATE 1 MG/ML IJ SOLN
INTRAMUSCULAR | Status: DC | PRN
  Administered 2011-11-18: 3 mg via INTRAVENOUS

## 2011-11-18 SURGICAL SUPPLY — 56 items
BAG DECANTER FOR FLEXI CONT (MISCELLANEOUS) ×2 IMPLANT
BENZOIN TINCTURE PRP APPL 2/3 (GAUZE/BANDAGES/DRESSINGS) IMPLANT
BIT DRILL NEURO 2X3.1 SFT TUCH (MISCELLANEOUS) ×1 IMPLANT
BLADE SURG ROTATE 9660 (MISCELLANEOUS) ×2 IMPLANT
BUR ROUND FLUTED 5 RND (BURR) ×2 IMPLANT
CANISTER SUCTION 2500CC (MISCELLANEOUS) ×2 IMPLANT
CLOTH BEACON ORANGE TIMEOUT ST (SAFETY) ×2 IMPLANT
CONT SPEC 4OZ CLIKSEAL STRL BL (MISCELLANEOUS) ×2 IMPLANT
DERMABOND ADVANCED (GAUZE/BANDAGES/DRESSINGS) ×1
DERMABOND ADVANCED .7 DNX12 (GAUZE/BANDAGES/DRESSINGS) ×1 IMPLANT
DRAPE LAPAROTOMY 100X72X124 (DRAPES) ×2 IMPLANT
DRAPE MICROSCOPE LEICA (MISCELLANEOUS) IMPLANT
DRAPE POUCH INSTRU U-SHP 10X18 (DRAPES) ×2 IMPLANT
DRAPE SURG 17X23 STRL (DRAPES) ×2 IMPLANT
DRESSING TELFA 8X3 (GAUZE/BANDAGES/DRESSINGS) IMPLANT
DRILL NEURO 2X3.1 SOFT TOUCH (MISCELLANEOUS) ×2
DURAPREP 26ML APPLICATOR (WOUND CARE) ×2 IMPLANT
ELECT REM PT RETURN 9FT ADLT (ELECTROSURGICAL) ×2
ELECTRODE REM PT RTRN 9FT ADLT (ELECTROSURGICAL) ×1 IMPLANT
GAUZE SPONGE 4X4 16PLY XRAY LF (GAUZE/BANDAGES/DRESSINGS) IMPLANT
GLOVE BIO SURGEON STRL SZ8 (GLOVE) ×4 IMPLANT
GLOVE BIOGEL PI IND STRL 8 (GLOVE) IMPLANT
GLOVE BIOGEL PI IND STRL 8.5 (GLOVE) ×2 IMPLANT
GLOVE BIOGEL PI INDICATOR 8 (GLOVE)
GLOVE BIOGEL PI INDICATOR 8.5 (GLOVE) ×2
GLOVE ECLIPSE 6.5 STRL STRAW (GLOVE) ×2 IMPLANT
GLOVE ECLIPSE 8.0 STRL XLNG CF (GLOVE) IMPLANT
GLOVE EXAM NITRILE LRG STRL (GLOVE) IMPLANT
GLOVE EXAM NITRILE MD LF STRL (GLOVE) IMPLANT
GLOVE EXAM NITRILE XL STR (GLOVE) IMPLANT
GLOVE EXAM NITRILE XS STR PU (GLOVE) IMPLANT
GLOVE SURG SS PI 8.0 STRL IVOR (GLOVE) ×4 IMPLANT
GOWN BRE IMP SLV AUR LG STRL (GOWN DISPOSABLE) ×2 IMPLANT
GOWN BRE IMP SLV AUR XL STRL (GOWN DISPOSABLE) ×2 IMPLANT
GOWN STRL REIN 2XL LVL4 (GOWN DISPOSABLE) ×2 IMPLANT
KIT BASIN OR (CUSTOM PROCEDURE TRAY) ×2 IMPLANT
KIT ROOM TURNOVER OR (KITS) ×2 IMPLANT
NEEDLE HYPO 18GX1.5 BLUNT FILL (NEEDLE) IMPLANT
NEEDLE HYPO 25X1 1.5 SAFETY (NEEDLE) ×2 IMPLANT
NS IRRIG 1000ML POUR BTL (IV SOLUTION) ×2 IMPLANT
PACK LAMINECTOMY NEURO (CUSTOM PROCEDURE TRAY) ×2 IMPLANT
PAD ARMBOARD 7.5X6 YLW CONV (MISCELLANEOUS) ×6 IMPLANT
RUBBERBAND STERILE (MISCELLANEOUS) IMPLANT
SPONGE GAUZE 4X4 12PLY (GAUZE/BANDAGES/DRESSINGS) IMPLANT
SPONGE SURGIFOAM ABS GEL SZ50 (HEMOSTASIS) ×2 IMPLANT
STAPLER SKIN PROX WIDE 3.9 (STAPLE) IMPLANT
STRIP CLOSURE SKIN 1/2X4 (GAUZE/BANDAGES/DRESSINGS) IMPLANT
SUT VIC AB 0 CT1 18XCR BRD8 (SUTURE) ×1 IMPLANT
SUT VIC AB 0 CT1 8-18 (SUTURE) ×1
SUT VIC AB 2-0 CT1 18 (SUTURE) ×2 IMPLANT
SUT VIC AB 3-0 SH 8-18 (SUTURE) ×4 IMPLANT
SYR 20ML ECCENTRIC (SYRINGE) ×2 IMPLANT
SYR 5ML LL (SYRINGE) IMPLANT
TOWEL OR 17X24 6PK STRL BLUE (TOWEL DISPOSABLE) ×2 IMPLANT
TOWEL OR 17X26 10 PK STRL BLUE (TOWEL DISPOSABLE) ×2 IMPLANT
WATER STERILE IRR 1000ML POUR (IV SOLUTION) ×2 IMPLANT

## 2011-11-18 NOTE — Anesthesia Procedure Notes (Signed)
Procedure Name: Intubation Date/Time: 11/18/2011 10:47 AM Performed by: Jefm Miles E Pre-anesthesia Checklist: Patient identified, Timeout performed, Emergency Drugs available, Suction available and Patient being monitored Patient Re-evaluated:Patient Re-evaluated prior to inductionOxygen Delivery Method: Circle system utilized Preoxygenation: Pre-oxygenation with 100% oxygen Intubation Type: IV induction Ventilation: Mask ventilation without difficulty Laryngoscope Size: Mac and 3 Grade View: Grade I Tube type: Oral Tube size: 7.0 mm Number of attempts: 1 Airway Equipment and Method: Stylet Placement Confirmation: ETT inserted through vocal cords under direct vision,  breath sounds checked- equal and bilateral and positive ETCO2 Secured at: 22 cm Tube secured with: Tape Dental Injury: Teeth and Oropharynx as per pre-operative assessment

## 2011-11-18 NOTE — Plan of Care (Signed)
Problem: Consults Goal: Diagnosis - Spinal Surgery Outcome: Completed/Met Date Met:  11/18/11 Lumbar Laminectomy (Complex)     

## 2011-11-18 NOTE — Anesthesia Postprocedure Evaluation (Signed)
  Anesthesia Post-op Note  Patient: Latoya Stevenson  Procedure(s) Performed: Procedure(s) (LRB) with comments: LUMBAR LAMINECTOMY/DECOMPRESSION MICRODISCECTOMY 2 LEVELS (N/A) - Lumbar Three-Four,  Lumbar Four-Five Decompressive Laminectomy  Patient Location: PACU  Anesthesia Type: General  Level of Consciousness: awake  Airway and Oxygen Therapy: Patient Spontanous Breathing  Post-op Pain: mild  Post-op Assessment: Post-op Vital signs reviewed  Post-op Vital Signs: Reviewed  Complications: No apparent anesthesia complications

## 2011-11-18 NOTE — Transfer of Care (Signed)
Immediate Anesthesia Transfer of Care Note  Patient: Latoya Stevenson  Procedure(s) Performed: Procedure(s) (LRB) with comments: LUMBAR LAMINECTOMY/DECOMPRESSION MICRODISCECTOMY 2 LEVELS (N/A) - Lumbar Three-Four,  Lumbar Four-Five Decompressive Laminectomy  Patient Location: PACU  Anesthesia Type: General  Level of Consciousness: awake  Airway & Oxygen Therapy: Patient Spontanous Breathing  Post-op Assessment: Report given to PACU RN  Post vital signs: Reviewed  Complications: No apparent anesthesia complications

## 2011-11-18 NOTE — Progress Notes (Signed)
Awake, alert, conversant.  MAEW with good strength bilateral PF, DF, EHL.  Doing well.

## 2011-11-18 NOTE — Interval H&P Note (Signed)
History and Physical Interval Note:  11/18/2011 6:57 AM  Latoya Stevenson  has presented today for surgery, with the diagnosis of lumbar stenosis lumbar spondylosis lumbar degenerative disc disease lumbar radiculopathy  The various methods of treatment have been discussed with the patient and family. After consideration of risks, benefits and other options for treatment, the patient has consented to  Procedure(s) (LRB): LUMBAR LAMINECTOMY/DECOMPRESSION MICRODISCECTOMY 2 LEVELS (N/A) as a surgical intervention .  [name] history has been reviewed, patient examined, no change in status, stable for surgery.  I have reviewed the patient's chart and labs.  Questions were answered to the patient's satisfaction.     Cathlene Gardella D  Date of Initial H&P: 11/16/2011  History reviewed, patient examined, no change in status, stable for surgery.

## 2011-11-18 NOTE — Op Note (Signed)
11/18/2011  12:46 PM  PATIENT:  Latoya Stevenson  71 y.o. 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 lumbar radiculopathy L3-5  POST-OPERATIVE DIAGNOSIS:  lumbar stenosis lumbar spondylosis lumbar degenerative disc disease lumbar radiculopathy L3-5  PROCEDURE:  Procedure(s) (LRB) with comments: LUMBAR LAMINECTOMY/DECOMPRESSION MICRODISCECTOMY 2 LEVELS (N/A) - Lumbar Three-Four,  Lumbar Four-Five Decompressive Laminectomy  SURGEON:  Surgeon(s) and Role:    * Maeola Harman, MD - Primary    * Carmela Hurt, MD - Assisting  PHYSICIAN ASSISTANT:   ASSISTANTS: Cabbell   ANESTHESIA:   general  EBL:  Total I/O In: 1000 [I.V.:1000] Out: 175 [Blood:175]  BLOOD ADMINISTERED:none  DRAINS: none   LOCAL MEDICATIONS USED:  LIDOCAINE   SPECIMEN:  No Specimen  DISPOSITION OF SPECIMEN:  N/A  COUNTS:  YES  TOURNIQUET:  * No tourniquets in log *  DICTATION: Patient has severe spinal stenosis at L 3/4 and L4-5 with significant bilateral leg weakness and intractable pain. It was elected to take her to surgery for L3 through L5 laminectomy.  Procedure: Patient was brought to the operating room and following the smooth and uncomplicated induction of general endotracheal anesthesia she was placed in a prone position on the Wilson frame. Low back was prepped and draped in the usual sterile fashion with DuraPrep. Area of planned incision was infiltrated with local lidocaine. Incision was made in the midline and carried to the lumbodorsal fascia which was incised bilaterally. Subperiosteal dissection was performed exposing what was felt to be L 34 and L45 levels. Intraoperative x-ray demonstrated marker probes at L 3-4 and L 4-5 levels. Subsequent X-ray was obtained with marker probes at the L3/4 and L4/5 levels. A total laminectomy of L3, L4 and L5 was performed with leksell, then a high-speed drill and completed with Kerrison rongeurs and  generous foraminotomies were performed to decompress the L3, L4, and L5 nerves bilaterally. Ligamentum flavum was detached and removed in a piecemeal fashion and the nerve roots were decompressed laterally with removal of the superior aspect of the facet and ligamentum causing nerve root compression. Angled curettes were used to detatch and then remove thickened compressive ligamentous material.  At this point it was felt that all neural elements were well decompressed. The wound was then irrigated with bacitracin saline. Hemostasis was assured with bipolar electrocautery. The lumbodorsal fascia was closed with 0 Vicryl sutures the subcutaneous tissues reapproximated 2-0 Vicryl inverted sutures and the skin edges were reapproximated with 3-0 Vicryl subcuticular stitch. The wound is dressed with Dermabond. Patient was extubated in the operating room and taken to recovery in stable and satisfactory condition having tolerated his operation well counts were correct at the end of the case.   PLAN OF CARE: Admit for overnight observation  PATIENT DISPOSITION:  PACU - hemodynamically stable.   Delay start of Pharmacological VTE agent (>24hrs) due to surgical blood loss or risk of bleeding: yes

## 2011-11-18 NOTE — Anesthesia Preprocedure Evaluation (Addendum)
Anesthesia Evaluation  Patient identified by MRN, date of birth, ID band Patient awake    Reviewed: Allergy & Precautions, H&P , NPO status   Airway Mallampati: II      Dental  (+) Edentulous Upper, Edentulous Lower and Dental Advisory Given   Pulmonary asthma ,  History of bronchitis breath sounds clear to auscultation        Cardiovascular hypertension, Rhythm:Regular Rate:Normal     Neuro/Psych    GI/Hepatic Neg liver ROS, GERD-  ,  Endo/Other  Hypothyroidism History of thyroid cancer.  Renal/GU negative Renal ROS     Musculoskeletal   Abdominal   Peds  Hematology   Anesthesia Other Findings   Reproductive/Obstetrics                         Anesthesia Physical Anesthesia Plan  ASA: III  Anesthesia Plan: General   Post-op Pain Management:    Induction: Intravenous  Airway Management Planned: Oral ETT  Additional Equipment:   Intra-op Plan:   Post-operative Plan: Extubation in OR  Informed Consent:   Plan Discussed with: Anesthesiologist, Surgeon and CRNA  Anesthesia Plan Comments:         Anesthesia Quick Evaluation

## 2011-11-18 NOTE — Transfer of Care (Signed)
Immediate Anesthesia Transfer of Care Note  Patient: Latoya Stevenson  Procedure(s) Performed: Procedure(s) (LRB) with comments: LUMBAR LAMINECTOMY/DECOMPRESSION MICRODISCECTOMY 2 LEVELS (N/A) - Lumbar Three-Four,  Lumbar Four-Five Decompressive Laminectomy  Patient Location: PACU  Anesthesia Type: General  Level of Consciousness: awake, oriented, lethargic and responds to stimulation  Airway & Oxygen Therapy: Patient Spontanous Breathing and Patient connected to nasal cannula oxygen  Post-op Assessment: Report given to PACU RN and Patient moving all extremities X 4  Post vital signs: Reviewed and stable  Complications: No apparent anesthesia complications

## 2011-11-18 NOTE — Preoperative (Signed)
Beta Blockers   Reason not to administer Beta Blockers:Not Applicable 

## 2011-11-19 ENCOUNTER — Encounter (HOSPITAL_COMMUNITY): Payer: Self-pay | Admitting: Neurosurgery

## 2011-11-19 NOTE — Progress Notes (Signed)
DC home

## 2011-11-19 NOTE — Progress Notes (Signed)
Alert, conversant, ambulating. Good strength BLE. Incision with Dermabond. No erythema, swelling, or drainage. VSS, AF. Pt verbalizes understanding of d/c instructions and agrees to call office to schedule 3-4wk f/u visit.   Georgiann Cocker, RN, BSN

## 2011-11-19 NOTE — Discharge Summary (Signed)
Physician Discharge Summary  Patient ID: DESIREE DAISE MRN: [id number] DOB/AGE: [birth year] 71 y.o.  Admit date: 11/18/2011 Discharge date: 11/19/2011  Admission Diagnoses:  Discharge Diagnoses:  Active Problems:  * No active hospital problems. *    Discharged Condition: good  Hospital Course: Uncomplicated decompression L3-5  Consults: None  Significant Diagnostic Studies: None  Treatments: surgery:  Uncomplicated decompression L3-5   Discharge Exam: Blood pressure 137/90, pulse 62, temperature 98.5 F (36.9 C), temperature source Oral, resp. rate 18, height 5\' 2"  (1.575 m), weight 77.111 kg (170 lb), SpO2 99.00%. Neurologic: Alert and oriented X 3, normal strength and tone. Normal symmetric reflexes. Normal coordination and gait Wound:CDI  Disposition: Home   Medication List  As of 11/19/2011  8:48 AM   TAKE these medications         acetaminophen 325 MG tablet   Commonly known as: TYLENOL   Take 650 mg by mouth every 6 (six) hours as needed. For pain      amLODipine 5 MG tablet   Commonly known as: NORVASC   Take 5 mg by mouth daily.      atorvastatin 40 MG tablet   Commonly known as: LIPITOR   Take 40 mg by mouth daily.      CALCIUM 1200 PO   Take 1 tablet by mouth daily.      diphenhydrAMINE 25 MG tablet   Commonly known as: SOMINEX   Take 50 mg by mouth at bedtime as needed. For sleep      DOXYLAMINE SUCCINATE PO   Take 25 mg by mouth at bedtime.      DULoxetine 60 MG capsule   Commonly known as: CYMBALTA   Take 60 mg by mouth daily.      fexofenadine 180 MG tablet   Commonly known as: ALLEGRA   Take 180 mg by mouth daily.      fluticasone 50 MCG/ACT nasal spray   Commonly known as: FLONASE   Place 2 sprays into the nose daily.      irbesartan 300 MG tablet   Commonly known as: AVAPRO   Take 300 mg by mouth at bedtime.      levothyroxine 137 MCG tablet   Commonly known as: SYNTHROID, LEVOTHROID   Take 137-205.5 mcg by mouth daily. Take 1.5  tablets on wednesdays and sundays. Take 1 tablet the rest of the week      methocarbamol 500 MG tablet   Commonly known as: ROBAXIN   Take 500 mg by mouth every morning.      multivitamin with minerals Tabs   Take 1 tablet by mouth daily.      nabumetone 500 MG tablet   Commonly known as: RELAFEN   Take 500 mg by mouth 2 (two) times daily.      omeprazole 20 MG capsule   Commonly known as: PRILOSEC   Take 20 mg by mouth daily.      OSTEO BI-FLEX REGULAR STRENGTH PO   Take 1 tablet by mouth daily.      Vitamin D-3 5000 UNITS Tabs   Take 5,000 Units by mouth daily.      vitamin E 400 UNIT capsule   Take 400 Units by mouth daily.             Signed: Dorian Heckle, MD 11/19/2011, 8:48 AM

## 2012-01-20 ENCOUNTER — Ambulatory Visit: Payer: Self-pay | Admitting: Ophthalmology

## 2012-01-20 DIAGNOSIS — I10 Essential (primary) hypertension: Secondary | ICD-10-CM

## 2012-01-28 ENCOUNTER — Ambulatory Visit: Payer: Self-pay | Admitting: Ophthalmology

## 2012-03-15 ENCOUNTER — Ambulatory Visit: Payer: Self-pay | Admitting: Ophthalmology

## 2012-11-08 ENCOUNTER — Ambulatory Visit: Payer: Self-pay | Admitting: Unknown Physician Specialty

## 2013-07-21 ENCOUNTER — Ambulatory Visit: Payer: Self-pay | Admitting: Gastroenterology

## 2013-07-21 HISTORY — PX: COLONOSCOPY: SHX174

## 2013-09-29 DIAGNOSIS — M5431 Sciatica, right side: Secondary | ICD-10-CM | POA: Insufficient documentation

## 2013-11-09 ENCOUNTER — Ambulatory Visit: Payer: Self-pay | Admitting: Internal Medicine

## 2013-11-11 DIAGNOSIS — G4700 Insomnia, unspecified: Secondary | ICD-10-CM | POA: Insufficient documentation

## 2013-11-11 DIAGNOSIS — J45909 Unspecified asthma, uncomplicated: Secondary | ICD-10-CM | POA: Insufficient documentation

## 2014-03-17 HISTORY — PX: POSTERIOR FUSION LUMBAR SPINE: SUR632

## 2014-06-29 DIAGNOSIS — E782 Mixed hyperlipidemia: Secondary | ICD-10-CM | POA: Insufficient documentation

## 2014-06-29 DIAGNOSIS — I10 Essential (primary) hypertension: Secondary | ICD-10-CM | POA: Insufficient documentation

## 2014-06-29 DIAGNOSIS — N1832 Chronic kidney disease, stage 3b: Secondary | ICD-10-CM | POA: Insufficient documentation

## 2014-07-04 NOTE — Op Note (Signed)
PATIENT NAME:  Latoya Stevenson, Latoya Stevenson MR#:  [id number] DATE OF BIRTH:  1940-11-13  DATE OF PROCEDURE:  01/28/2012  PREOPERATIVE DIAGNOSIS:  Cataract, left eye.    POSTOPERATIVE DIAGNOSIS:  Cataract, left eye.  PROCEDURE PERFORMED:  Extracapsular cataract extraction using phacoemulsification with placement of an Alcon SN60CWS, 22.5-diopter posterior chamber lens, serial # [id number].  SURGEON:  Latoya Back. Kein Carlberg, MD  ASSISTANT:  None.  ANESTHESIA:  4% lidocaine and 0.75% Marcaine in a 50/50 mixture with 10 units/mL of Hylenex added, given as a peribulbar.  ANESTHESIOLOGIST:  Vashti Hey, MD   COMPLICATIONS:  None.  ESTIMATED BLOOD LOSS:  Less than 1 mL.  DESCRIPTION OF PROCEDURE:  The patient was brought to the operating room and given a peribulbar block.  The patient was then prepped and draped in the usual fashion.  The vertical rectus muscles were imbricated using 5-0 silk sutures.  These sutures were then clamped to the sterile drapes as bridle sutures.  A limbal peritomy was performed extending two clock hours and hemostasis was obtained with cautery.  A partial thickness scleral groove was made at the surgical limbus and dissected anteriorly in a lamellar dissection using an Alcon crescent knife.  The anterior chamber was entered supero-temporally with a Superblade and through the lamellar dissection with a 2.6 mm keratome.  DisCoVisc was used to replace the aqueous and a continuous tear capsulorrhexis was carried out.  Hydrodissection and hydrodelineation were carried out with balanced salt and a 27 gauge canula.  The nucleus was rotated to confirm the effectiveness of the hydrodissection.  Phacoemulsification was carried out using a divide-and-conquer technique.  Total ultrasound time was 1 minute and 56 seconds with an average power of 19.9 percent. CDE 41.73.  Irrigation/aspiration was used to remove the residual cortex.  DisCoVisc was used to inflate the capsule and the internal  incision was enlarged to 3 mm with the crescent knife.  The intraocular lens was folded and inserted into the capsular bag using the AcrySert Delivery System.   Irrigation/aspiration was used to remove the residual DisCoVisc.  Miostat was injected into the anterior chamber through the paracentesis track to inflate the anterior chamber and induce miosis.  The wound was checked for leaks and none were found. The conjunctiva was closed with cautery and the bridle sutures were removed.  Two drops of 0.3% Vigamox were placed on the eye.   An eye shield was placed on the eye.  The patient was discharged to the recovery room in good condition.  ____________________________ Latoya Back Eamonn Sermeno, MD sad:cbb D: 01/28/2012 12:18:11 ET T: 01/28/2012 12:31:32 ET JOB#: [id number]  cc: Remo Lipps A. Kourtney Terriquez, MD, <Dictator> Martie Lee MD ELECTRONICALLY SIGNED 02/09/2012 14:40

## 2014-07-07 NOTE — Op Note (Signed)
PATIENT NAME:  Latoya Stevenson, Latoya Stevenson MR#:  [id number] DATE OF BIRTH:  03-Mar-1941  DATE OF PROCEDURE:  03/15/2012  PREOPERATIVE DIAGNOSIS: Cataract, right eye.   POSTOPERATIVE DIAGNOSIS: Cataract, right eye.   PROCEDURE PERFORMED: Extracapsular cataract extraction using phacoemulsification with placement Alcon [id number], 22.5 diopter posterior chamber lens, serial number [id number].  SURGEON: Loura Back. Jeb Schloemer, M.D.   ANESTHESIA: 4% lidocaine and 0.75% Marcaine, a 50-50 mixture with 10 units/mL of HyoMax added, given as a peribulbar.   ANESTHESIOLOGIST: Gjibertus F. Boston Service, MD.   COMPLICATIONS: None.   ESTIMATED BLOOD LOSS: Less than 1 mL.   PREOPERATIVE DIAGNOSIS:  Cataract, right eye.   POSTOPERATIVE DIAGNOSIS:  Cataract, right eye.  COMPLICATIONS:  None.  ESTIMATED BLOOD LOSS:  Less than 1 ml.  DESCRIPTION OF PROCEDURE:  The patient was brought to the operating room and given a peribulbar block.  The patient was then prepped and draped in the usual fashion.  The vertical rectus muscles were imbricated using 5-0 silk sutures.  These sutures were then clamped to the sterile drapes as bridle sutures.  A limbal peritomy was performed extending two clock hours and hemostasis was obtained with cautery.  A partial thickness scleral groove was made at the surgical limbus and dissected anteriorly in a lamellar dissection using an Alcon crescent knife.  The anterior chamber was entered superonasally with a Superblade and through the lamellar dissection with a 2.6 mm keratome.  DisCoVisc was used to replace the aqueous and a continuous tear capsulorrhexis was carried out.  Hydrodissection and hydrodelineation were carried out with balanced salt and a 27-gauge canula.  The nucleus was rotated to confirm the effectiveness of the hydrodissection.  Phacoemulsification was carried out using a divide-and-conquer technique.  Total ultrasound time was 1 minute and 50 seconds with an average power of  15.1%.  CDE 28.34.  Irrigation/aspiration was used to remove the residual cortex.  DisCoVisc was used to inflate the capsule and the internal incision was enlarged to 3 mm with the crescent knife.  The intraocular lens was folded and inserted into the capsular bag using the Alcon AcrySert delivery system.  Irrigation/aspiration was used to remove the residual DisCoVisc.  Miostat was injected into the anterior chamber through the paracentesis track to inflate the anterior chamber and induce miosis.  The wound was checked for leaks and none were found. The conjunctiva was closed with cautery and the bridle sutures were removed.  Two drops of 0.3% Vigamox were placed on the eye.   An eye shield was placed on the eye.  The patient was discharged to the recovery room in good condition.       ____________________________ Loura Back Cleaster Shiffer, MD sad:eg D: 03/15/2012 13:25:32 ET T: 03/15/2012 15:13:52 ET JOB#: [id number]  cc: Remo Lipps A. Beverley Allender, MD, <Dictator> Martie Lee MD ELECTRONICALLY SIGNED 03/22/2012 13:46

## 2014-08-09 ENCOUNTER — Ambulatory Visit
Admission: EM | Admit: 2014-08-09 | Discharge: 2014-08-09 | Disposition: A | Discharge status: Home / Self Care | Payer: Medicare Other | Attending: Family Medicine | Admitting: Family Medicine

## 2014-08-09 ENCOUNTER — Ambulatory Visit: Payer: Medicare Other

## 2014-08-09 DIAGNOSIS — Z86010 Personal history of colonic polyps: Secondary | ICD-10-CM | POA: Diagnosis not present

## 2014-08-09 DIAGNOSIS — S61002A Unspecified open wound of left thumb without damage to nail, initial encounter: Secondary | ICD-10-CM | POA: Diagnosis not present

## 2014-08-09 DIAGNOSIS — K219 Gastro-esophageal reflux disease without esophagitis: Secondary | ICD-10-CM | POA: Insufficient documentation

## 2014-08-09 DIAGNOSIS — K589 Irritable bowel syndrome without diarrhea: Secondary | ICD-10-CM | POA: Insufficient documentation

## 2014-08-09 DIAGNOSIS — E785 Hyperlipidemia, unspecified: Secondary | ICD-10-CM | POA: Diagnosis not present

## 2014-08-09 DIAGNOSIS — E039 Hypothyroidism, unspecified: Secondary | ICD-10-CM | POA: Insufficient documentation

## 2014-08-09 DIAGNOSIS — W298XXA Contact with other powered powered hand tools and household machinery, initial encounter: Secondary | ICD-10-CM | POA: Insufficient documentation

## 2014-08-09 DIAGNOSIS — Z85850 Personal history of malignant neoplasm of thyroid: Secondary | ICD-10-CM | POA: Insufficient documentation

## 2014-08-09 DIAGNOSIS — M1990 Unspecified osteoarthritis, unspecified site: Secondary | ICD-10-CM | POA: Diagnosis not present

## 2014-08-09 DIAGNOSIS — S61012A Laceration without foreign body of left thumb without damage to nail, initial encounter: Secondary | ICD-10-CM | POA: Insufficient documentation

## 2014-08-09 DIAGNOSIS — I10 Essential (primary) hypertension: Secondary | ICD-10-CM | POA: Insufficient documentation

## 2014-08-09 MED ORDER — TETANUS-DIPHTH-ACELL PERTUSSIS 5-2.5-18.5 LF-MCG/0.5 IM SUSP
0.5000 mL | Freq: Once | INTRAMUSCULAR | Status: AC
  Administered 2014-08-09: 0.5 mL via INTRAMUSCULAR

## 2014-08-09 MED ORDER — LIDOCAINE-EPINEPHRINE-TETRACAINE (LET) SOLUTION
3.0000 mL | Freq: Once | NASAL | Status: AC
  Administered 2014-08-09: 3 mL via TOPICAL

## 2014-08-09 MED ORDER — MUPIROCIN CALCIUM 2 % EX CREA: 1.0000 "application " | TOPICAL_CREAM | Freq: Every day | CUTANEOUS | Status: DC

## 2014-08-09 MED ORDER — TETANUS-DIPHTH-ACELL PERTUSSIS 5-2.5-18.5 LF-MCG/0.5 IM SUSP: 0.5000 mL | Freq: Once | INTRAMUSCULAR | Status: DC

## 2014-08-09 MED ORDER — CIPROFLOXACIN HCL 250 MG PO TABS: 250.0000 mg | ORAL_TABLET | Freq: Two times a day (BID) | ORAL | Status: DC

## 2014-08-09 NOTE — ED Provider Notes (Signed)
CSN: [id number]     Arrival date & time 08/09/14  1515 History   First MD Initiated Contact with Patient 08/09/14 1637     Chief Complaint  Patient presents with  . Extremity Laceration   (Consider location/radiation/quality/duration/timing/severity/associated sxs/prior Treatment) HPI 74 yo F doing yard project this afternoon.. Drilling cedar post when drill bit fractured and cut left thumb web. Momentary swelling initially -reduced with pressure. Bleeding initially but stopped with pressure. Unsure of tetanus Rx hx. Does many craft projects and is relieved that wound is small but admits to being a bit tense.  Past Medical History  Diagnosis Date  . Hypertension   . IBS (irritable bowel syndrome)   . Ulcer   . Colon polyp   . Arthritis   . Hyperlipidemia   . Bronchitis   . Wears dentures   . GERD (gastroesophageal reflux disease)   . Hypothyroidism   . Cancer     thyroid cancer   Past Surgical History  Procedure Laterality Date  . Abdominal hysterectomy  1977  . Broken arm  1994  . Colonoscopy  2010  . Upper and lower gi  2010  . Transthoracic echocardiogram  2010  . Cardiovascular stress test      2010  . Total thyroidectomy    . Lumbar laminectomy/decompression microdiscectomy  11/18/2011    Procedure: LUMBAR LAMINECTOMY/DECOMPRESSION MICRODISCECTOMY 2 LEVELS;  Surgeon: Erline Levine, MD;  Location: [facility];  Service: Neurosurgery;  Laterality: N/A;  Lumbar Three-Four,  Lumbar Four-Five Decompressive Laminectomy   Family History  Problem Relation Age of Onset  . Cancer Mother     breast  . Cancer Brother   . Cancer Sister    History  Substance Use Topics  . Smoking status: Never Smoker   . Smokeless tobacco: Not on file  . Alcohol Use: No   OB History    No data available     Review of Systems  Skin: Positive for wound.  All other systems reviewed and are negative. Constitutional -afebrile Eyes-denies visual changes ENT- normal voice,denies sore  throat CV-denies chest pain Resp-denies SOB GI- negative for nausea,vomiting, diarrhea GU- negative for dysuria MSK- negative for back pain, ambulatory Skin wound left thumb webspace; full function, no numbness Neuro- negative headache,focal weakness or numbness    Allergies  Celebrex; Flagyl; Macrodantin; and Penicillins  Home Medications   Prior to Admission medications   Medication Sig Start Date End Date Taking? Authorizing Provider  acetaminophen (TYLENOL) 325 MG tablet Take 650 mg by mouth every 6 (six) hours as needed. For pain   Yes Historical Provider, MD  amLODipine (NORVASC) 5 MG tablet Take 5 mg by mouth daily.    Yes Historical Provider, MD  atorvastatin (LIPITOR) 40 MG tablet Take 40 mg by mouth daily.   Yes Historical Provider, MD  Calcium Carbonate-Vit D-Min (CALCIUM 1200 PO) Take 1 tablet by mouth daily.    Yes Historical Provider, MD  Cholecalciferol (VITAMIN D-3) 5000 UNITS TABS Take 5,000 Units by mouth daily.    Yes Historical Provider, MD  DOXYLAMINE SUCCINATE PO Take 25 mg by mouth at bedtime.    Yes Historical Provider, MD  fexofenadine (ALLEGRA) 180 MG tablet Take 180 mg by mouth daily.     Yes Historical Provider, MD  fluticasone (FLONASE) 50 MCG/ACT nasal spray Place 2 sprays into the nose daily.   Yes Historical Provider, MD  Glucosamine-Chondroitin (OSTEO BI-FLEX REGULAR STRENGTH PO) Take 1 tablet by mouth daily.    Yes Historical  Provider, MD  irbesartan (AVAPRO) 300 MG tablet Take 300 mg by mouth at bedtime.     Yes Historical Provider, MD  levothyroxine (SYNTHROID, LEVOTHROID) 150 MCG tablet Take 150 mcg by mouth daily before breakfast.   Yes Historical Provider, MD  Multiple Vitamin (MULTIVITAMIN WITH MINERALS) TABS Take 1 tablet by mouth daily.   Yes Historical Provider, MD  omeprazole (PRILOSEC) 20 MG capsule Take 20 mg by mouth daily.    Yes Historical Provider, MD  vitamin E 400 UNIT capsule Take 400 Units by mouth daily.     Yes Historical  Provider, MD  ciprofloxacin (CIPRO) 250 MG tablet Take 1 tablet (250 mg total) by mouth 2 (two) times daily. 08/09/14   Jan Fireman, PA-C  diphenhydrAMINE (SOMINEX) 25 MG tablet Take 50 mg by mouth at bedtime as needed. For sleep    Historical Provider, MD  DULoxetine (CYMBALTA) 60 MG capsule Take 60 mg by mouth daily.    Historical Provider, MD  levothyroxine (SYNTHROID, LEVOTHROID) 137 MCG tablet Take 137-205.5 mcg by mouth daily. Take 1.5 tablets on wednesdays and sundays. Take 1 tablet the rest of the week    Historical Provider, MD  methocarbamol (ROBAXIN) 500 MG tablet Take 500 mg by mouth every morning.    Historical Provider, MD  mupirocin cream (BACTROBAN) 2 % Apply 1 application topically daily. 08/09/14   Jan Fireman, PA-C  nabumetone (RELAFEN) 500 MG tablet Take 500 mg by mouth 2 (two) times daily.      Historical Provider, MD   BP 153/89 mmHg  Pulse 80  Temp(Src) 96.4 F (35.8 C) (Tympanic)  Resp 16  Ht 5\' 3"  (1.6 m)  Wt 170 lb (77.111 kg)  BMI 30.12 kg/m2  SpO2 98% Physical Exam Constitutional -alert and oriented,well appearing and in no acute distress Head-atraumatic Eyes-,conjugate gaze Nose- no congestion or rhinorrhea Mouth/throat- normal voice Neck- supple CV- regular rate,  Resp-no distress,  Skin-warm,dry ,intact; no rash noted- laceration left thumb webspace-3 cm total -skin only but lifted reveals depth of about 1 cm. No bleeding. Full function. No numbness. Patient is wearing false nails that have fallen off and been re-glued, thus they project above the plane of the nail. Recommend they be removed as scenario susceptible to fungus Psych-mood and affect grossly normal; speech and behavior grossly normal  ED Course  Procedures (including critical care time)  Wound was soaked and then cleansed with saline and chlorhexidine after topical LET 2x2 had been in situ - patient tolerated well. Wound edges clean. Dressed with bulky dressing for comfort and wound  protection.  Labs Review Labs Reviewed - No data to display  Imaging Review Dg Finger Thumb Left  08/09/2014   CLINICAL DATA:  Status post puncture wound at the base of the left thumb by a drill today. Initial encounter.  EXAM: LEFT THUMB 2+V  COMPARISON:  None.  FINDINGS: No fracture or radiopaque foreign body is identified. There appears to be some gas in the soft tissues over the dorsum of the base of the thumb. The patient has advanced degenerative disease about the first [facility] joint and IP joint of the thumb. The nail of the left thumb appears lifted.  IMPRESSION: Negative for fracture or foreign body.  Abnormal appearance of the nail of the left thumb should be amenable to direct visual inspection.  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   Electronically Signed   By: Inge Rise M.D.   On: 08/09/2014  16:18   Medications  Tdap (BOOSTRIX) injection 0.5 mL (not administered)  lidocaine-EPINEPHrine-tetracaine (LET) solution (3 mLs Topical Given 08/09/14 1635)  Tdap (BOOSTRIX) injection 0.5 mL (0.5 mLs Intramuscular Given 08/09/14 1742)     MDM   1. Laceration of thumb, left, initial encounter     Xray negative for fracture- but gas noted on films. Discussed with Dr. Laqueta Carina Delma Freeze Felt secondary to the drill entry and air-do not close wound- soak daily and dress. Follow up for wound re-evaluation.  Discussed with Dr Zenda Alpers and he recommends covering with Cipro 250 mg BID x 1 week and I have given mupirocin topical to be used with dressing changes. Soak basin and supplies for start up.  Plan: 1. Test/x-ray results and diagnosis reviewed with patient 2. rx as per orders; risks, benefits, potential side effects reviewed with patient 3. Recommend supportive treatment with dressing changes daily 4. F/u prn if symptoms worsen or don't improve 5. Be seen on Saturday for wound check   Jan Fireman, PA-C 08/09/14 5217

## 2014-08-09 NOTE — ED Notes (Signed)
States a steel drill bit that was broken went into base of left thumb approx. 1 hour ago 1 cm laceration base of thumb

## 2014-08-09 NOTE — Discharge Instructions (Signed)
Please keep left hand clean and dry with secure bandage. Please soak once a day in warm water with a squirt of the medicated soap we are sending home ( chlorhexidene). You have an antibiotic pill for twice a day ( Cipro) and antibiotic ointment for hand. I sent supplies for your bandage change tomorrow but you will need more from the pharmacy for the next few days. Please report back to [place] if the area becomes swollen, red, hot , or draining- otherwise we would like to see you back on Saturday for re-evaluation.  SEEK MEDICAL CARE IF:   You have redness, swelling, or increasing pain in your wound.  A red streak or line extends away from the wound.  You have pus coming from the wound.  You notice a bad smell coming from thewound or dressing.  The wound breaks open (edges not staying together) after sutures have been removed.  You notice something coming out of the wound, such as a small piece of wood, glass, or metal.  You are unable to properly move a finger or toe if the wound is on your hand or foot.  You have severe swelling around the wound that causes pain and numbness.  Your arm, hand, leg, or foot changes color. SEEK IMMEDIATE MEDICAL CARE IF:   Your pain becomes severe or is not adequately relieved with pain medicine.  You have a fever.  You have nausea and vomiting for more than 24 hours.  You feel lightheaded, weak, or faint.  You develop chest pain or difficulty breathing. MAKE SURE YOU:   Understand these instructions.  Will watch your condition.  Will get help right away if you are not doing well or get worse. Document Released: 04/29/2006 Document Revised: 05/26/2011 Document Reviewed: 07/07/2010 [facility] Patient Information 2015 [place], [place]. This information is not intended to replace advice given to you by your health care provider. Make sure you discuss any questions you have with your health care provider.

## 2014-10-12 ENCOUNTER — Other Ambulatory Visit: Payer: Self-pay | Admitting: Neurosurgery

## 2014-10-18 ENCOUNTER — Encounter (HOSPITAL_COMMUNITY): Payer: Self-pay

## 2014-10-18 ENCOUNTER — Other Ambulatory Visit (HOSPITAL_COMMUNITY): Payer: Self-pay | Admitting: *Deleted

## 2014-10-18 ENCOUNTER — Encounter (HOSPITAL_COMMUNITY)
Admission: RE | Admit: 2014-10-18 | Discharge: 2014-10-18 | Disposition: A | Discharge status: Home / Self Care | Payer: Medicare Other | Source: Ambulatory Visit | Attending: Neurosurgery | Admitting: Neurosurgery

## 2014-10-18 DIAGNOSIS — I10 Essential (primary) hypertension: Secondary | ICD-10-CM | POA: Diagnosis not present

## 2014-10-18 DIAGNOSIS — Z01812 Encounter for preprocedural laboratory examination: Secondary | ICD-10-CM | POA: Diagnosis not present

## 2014-10-18 DIAGNOSIS — M4806 Spinal stenosis, lumbar region: Secondary | ICD-10-CM | POA: Insufficient documentation

## 2014-10-18 DIAGNOSIS — Z0183 Encounter for blood typing: Secondary | ICD-10-CM | POA: Insufficient documentation

## 2014-10-18 DIAGNOSIS — M4120 Other idiopathic scoliosis, site unspecified: Secondary | ICD-10-CM | POA: Diagnosis not present

## 2014-10-18 DIAGNOSIS — Z01810 Encounter for preprocedural cardiovascular examination: Secondary | ICD-10-CM | POA: Diagnosis not present

## 2014-10-18 HISTORY — DX: Pneumonia, unspecified organism: J18.9

## 2014-10-18 HISTORY — DX: Anemia, unspecified: D64.9

## 2014-10-18 HISTORY — DX: Scoliosis, unspecified: M41.9

## 2014-10-18 HISTORY — DX: Other allergy status, other than to drugs and biological substances: Z91.09

## 2014-10-18 HISTORY — DX: Headache: R51

## 2014-10-18 HISTORY — DX: Headache, unspecified: R51.9

## 2014-10-18 HISTORY — DX: Contact with and (suspected) exposure to mold (toxic): Z77.120

## 2014-10-18 LAB — CBC
HCT: 39.8 % (ref 36.0–46.0)
Hemoglobin: 13 g/dL (ref 12.0–15.0)
MCH: 29.4 pg (ref 26.0–34.0)
MCHC: 32.7 g/dL (ref 30.0–36.0)
MCV: 90 fL (ref 78.0–100.0)
Platelets: 170 10*3/uL (ref 150–400)
RBC: 4.42 MIL/uL (ref 3.87–5.11)
RDW: 13.9 % (ref 11.5–15.5)
WBC: 5 10*3/uL (ref 4.0–10.5)

## 2014-10-18 LAB — TYPE AND SCREEN
ABO/RH(D): A POS
Antibody Screen: NEGATIVE

## 2014-10-18 LAB — SURGICAL PCR SCREEN
MRSA, PCR: NEGATIVE
Staphylococcus aureus: NEGATIVE

## 2014-10-18 LAB — BASIC METABOLIC PANEL
Anion gap: 7 (ref 5–15)
BUN: 17 mg/dL (ref 6–20)
CO2: 25 mmol/L (ref 22–32)
Calcium: 9.2 mg/dL (ref 8.9–10.3)
Chloride: 108 mmol/L (ref 101–111)
Creatinine, Ser: 0.9 mg/dL (ref 0.44–1.00)
GFR calc Af Amer: >60 mL/min (ref >60)
GFR calc non Af Amer: >60 mL/min (ref >60)
Glucose, Bld: 90 mg/dL (ref 65–99)
Potassium: 4.2 mmol/L (ref 3.5–5.1)
Sodium: 140 mmol/L (ref 135–145)

## 2014-10-18 LAB — ABO/RH: ABO/RH(D): A POS

## 2014-10-18 NOTE — Pre-Procedure Instructions (Signed)
Latoya Stevenson  10/18/2014      Your procedure is scheduled on Friday, October 27, 2014 at 11:10 AM.   Report to [facility] Entrance "A" Admitting Office at 8:15 AM.   Call this number if you have problems the morning of surgery: [phone]              Any questions prior to day of surgery, please call [phone] between 8 & 4 PM.   Remember:  Do not eat food or drink liquids after midnight Thursday, 10/26/14.  Take these medicines the morning of surgery with A SIP OF WATER: Amlodipine (Norvasc), Duloxetine (Cymbalta), Allegra, Levothyroxine (Synthroid), Omeprazole (Prilosec), Methocarbamol (Robaxin), Oxycodone - if needed or Tylenol - if needed.  Stop Multivitamins, Vitamin E, Herbal Medications and NSAIDS (Relafen, Ibuprofen, Aleve, etc.) 7 days prior to surgery. Do not use any Aspirin products 7 days prior to surgery.   Do not wear jewelry, make-up or nail polish.  Do not wear lotions, powders, or perfumes.  You may wear deodorant.  Do not shave 48 hours prior to surgery.    Do not bring valuables to the hospital.  [facility] is not responsible for any belongings or valuables.  Contacts, dentures or bridgework may not be worn into surgery.  Leave your suitcase in the car.  After surgery it may be brought to your room.  For patients admitted to the hospital, discharge time will be determined by your treatment team.  Special instructions:  [facility] - Preparing for Surgery  Before surgery, you can play an important role.  Because skin is not sterile, your skin needs to be as free of germs as possible.  You can reduce the number of germs on you skin by washing with CHG (chlorahexidine gluconate) soap before surgery.  CHG is an antiseptic cleaner which kills germs and bonds with the skin to continue killing germs even after washing.  Please DO NOT use if you have an allergy to CHG or antibacterial soaps.  If your skin becomes reddened/irritated stop using the CHG and  inform your nurse when you arrive at Short Stay.  Do not shave (including legs and underarms) for at least 48 hours prior to the first CHG shower.  You may shave your face.  Please follow these instructions carefully:   1.  Shower with CHG Soap the night before surgery and the                                morning of Surgery.  2.  If you choose to wash your hair, wash your hair first as usual with your       normal shampoo.  3.  After you shampoo, rinse your hair and body thoroughly to remove the                      Shampoo.  4.  Use CHG as you would any other liquid soap.  You can apply chg directly       to the skin and wash gently with scrungie or a clean washcloth.  5.  Apply the CHG Soap to your body ONLY FROM THE NECK DOWN.        Do not use on open wounds or open sores.  Avoid contact with your eyes, ears, mouth and genitals (private parts).  Wash genitals (private parts) with your normal soap.  6.  Wash thoroughly, paying special attention to the area where your surgery        will be performed.  7.  Thoroughly rinse your body with warm water from the neck down.  8.  DO NOT shower/wash with your normal soap after using and rinsing off       the CHG Soap.  9.  Pat yourself dry with a clean towel.            10.  Wear clean pajamas.            11.  Place clean sheets on your bed the night of your first shower and do not        sleep with pets.  Day of Surgery  Do not apply any lotions the morning of surgery.  Please wear clean clothes to the hospital.    Please read over the following fact sheets that you were given. Pain Booklet, Coughing and Deep Breathing, Blood Transfusion Information, MRSA Information and Surgical Site Infection Prevention

## 2014-10-26 MED ORDER — VANCOMYCIN HCL IN DEXTROSE 1-5 GM/200ML-% IV SOLN
1000.0000 mg | INTRAVENOUS | Status: AC
  Administered 2014-10-27: 1000 mg via INTRAVENOUS
  Filled 2014-10-26: qty 200

## 2014-10-27 ENCOUNTER — Inpatient Hospital Stay (HOSPITAL_COMMUNITY): Payer: Medicare Other | Admitting: Anesthesiology

## 2014-10-27 ENCOUNTER — Inpatient Hospital Stay (HOSPITAL_COMMUNITY): Payer: Medicare Other

## 2014-10-27 ENCOUNTER — Encounter (HOSPITAL_COMMUNITY): Payer: Self-pay | Admitting: Anesthesiology

## 2014-10-27 ENCOUNTER — Encounter (HOSPITAL_COMMUNITY)
Admission: RE | Disposition: A | Discharge status: Skilled Nursing Facility | Payer: Self-pay | Source: Ambulatory Visit | Attending: Neurosurgery

## 2014-10-27 ENCOUNTER — Inpatient Hospital Stay (HOSPITAL_COMMUNITY)
Admission: RE | Admit: 2014-10-27 | Discharge: 2014-10-31 | DRG: 458 | Disposition: A | Discharge status: Skilled Nursing Facility | Payer: Medicare Other | Source: Ambulatory Visit | Attending: Neurosurgery | Admitting: Neurosurgery

## 2014-10-27 DIAGNOSIS — I10 Essential (primary) hypertension: Secondary | ICD-10-CM | POA: Diagnosis present

## 2014-10-27 DIAGNOSIS — M8580 Other specified disorders of bone density and structure, unspecified site: Secondary | ICD-10-CM | POA: Diagnosis present

## 2014-10-27 DIAGNOSIS — M4806 Spinal stenosis, lumbar region: Secondary | ICD-10-CM | POA: Diagnosis present

## 2014-10-27 DIAGNOSIS — Z419 Encounter for procedure for purposes other than remedying health state, unspecified: Secondary | ICD-10-CM

## 2014-10-27 DIAGNOSIS — M4310 Spondylolisthesis, site unspecified: Secondary | ICD-10-CM | POA: Diagnosis present

## 2014-10-27 DIAGNOSIS — M419 Scoliosis, unspecified: Secondary | ICD-10-CM | POA: Diagnosis present

## 2014-10-27 DIAGNOSIS — M5416 Radiculopathy, lumbar region: Secondary | ICD-10-CM | POA: Diagnosis present

## 2014-10-27 DIAGNOSIS — M4120 Other idiopathic scoliosis, site unspecified: Principal | ICD-10-CM | POA: Diagnosis present

## 2014-10-27 DIAGNOSIS — M549 Dorsalgia, unspecified: Secondary | ICD-10-CM | POA: Diagnosis present

## 2014-10-27 LAB — GLUCOSE, CAPILLARY: Glucose-Capillary: 95 mg/dL (ref 65–99)

## 2014-10-27 SURGERY — POSTERIOR LUMBAR FUSION 2 LEVEL
Anesthesia: General | Site: Back

## 2014-10-27 MED ORDER — EPHEDRINE SULFATE 50 MG/ML IJ SOLN
INTRAMUSCULAR | Status: DC | PRN
  Administered 2014-10-27: 5 mg via INTRAVENOUS
  Administered 2014-10-27 (×3): 10 mg via INTRAVENOUS

## 2014-10-27 MED ORDER — SODIUM CHLORIDE 0.9 % IR SOLN
Status: DC | PRN
  Administered 2014-10-27: 1000 mL

## 2014-10-27 MED ORDER — VITAMIN D3 25 MCG (1000 UNIT) PO TABS
5000.0000 [IU] | ORAL_TABLET | Freq: Every day | ORAL | Status: DC
  Administered 2014-10-28 – 2014-10-31 (×4): 5000 [IU] via ORAL
  Filled 2014-10-27 (×8): qty 5

## 2014-10-27 MED ORDER — OXYCODONE-ACETAMINOPHEN 5-325 MG PO TABS
1.0000 | ORAL_TABLET | ORAL | Status: DC | PRN
  Administered 2014-10-27: 1 via ORAL
  Administered 2014-10-28 – 2014-10-29 (×3): 2 via ORAL
  Filled 2014-10-27 (×3): qty 2

## 2014-10-27 MED ORDER — HYDROCODONE-ACETAMINOPHEN 5-325 MG PO TABS
1.0000 | ORAL_TABLET | ORAL | Status: DC | PRN
  Administered 2014-10-27 – 2014-10-29 (×4): 2 via ORAL
  Filled 2014-10-27 (×4): qty 2

## 2014-10-27 MED ORDER — OXYCODONE-ACETAMINOPHEN 5-325 MG PO TABS
ORAL_TABLET | ORAL | Status: AC
  Filled 2014-10-27: qty 1

## 2014-10-27 MED ORDER — LACTATED RINGERS IV SOLN
INTRAVENOUS | Status: DC
  Administered 2014-10-27 (×3): via INTRAVENOUS

## 2014-10-27 MED ORDER — OXYCODONE-ACETAMINOPHEN 5-325 MG PO TABS: 1.0000 | ORAL_TABLET | Freq: Three times a day (TID) | ORAL | Status: DC | PRN

## 2014-10-27 MED ORDER — FLUTICASONE PROPIONATE 50 MCG/ACT NA SUSP
2.0000 | Freq: Every evening | NASAL | Status: DC
  Administered 2014-10-27 – 2014-10-31 (×5): 2 via NASAL
  Filled 2014-10-27: qty 16

## 2014-10-27 MED ORDER — LIDOCAINE HCL (CARDIAC) 20 MG/ML IV SOLN
INTRAVENOUS | Status: AC
  Filled 2014-10-27: qty 5

## 2014-10-27 MED ORDER — MENTHOL 3 MG MT LOZG: 1.0000 | LOZENGE | OROMUCOSAL | Status: DC | PRN

## 2014-10-27 MED ORDER — THROMBIN 20000 UNITS EX SOLR
CUTANEOUS | Status: DC | PRN
  Administered 2014-10-27: 20 mL via TOPICAL

## 2014-10-27 MED ORDER — ONDANSETRON HCL 4 MG/2ML IJ SOLN
INTRAMUSCULAR | Status: AC
  Filled 2014-10-27: qty 2

## 2014-10-27 MED ORDER — FENTANYL CITRATE (PF) 100 MCG/2ML IJ SOLN
INTRAMUSCULAR | Status: AC
  Filled 2014-10-27: qty 2

## 2014-10-27 MED ORDER — DIPHENHYDRAMINE HCL 25 MG PO CAPS: 50.0000 mg | ORAL_CAPSULE | Freq: Every evening | ORAL | Status: DC | PRN

## 2014-10-27 MED ORDER — ONDANSETRON HCL 4 MG/2ML IJ SOLN
4.0000 mg | INTRAMUSCULAR | Status: DC | PRN
Start: 2014-10-27 — End: 2014-10-31
  Administered 2014-10-28: 4 mg via INTRAVENOUS
  Filled 2014-10-27: qty 2

## 2014-10-27 MED ORDER — SODIUM CHLORIDE 0.9 % IV SOLN
250.0000 mL | INTRAVENOUS | Status: DC
  Administered 2014-10-27: 250 mL via INTRAVENOUS

## 2014-10-27 MED ORDER — MECLIZINE HCL 12.5 MG PO TABS: 12.5000 mg | ORAL_TABLET | Freq: Three times a day (TID) | ORAL | Status: DC | PRN

## 2014-10-27 MED ORDER — ARTIFICIAL TEARS OP OINT
TOPICAL_OINTMENT | OPHTHALMIC | Status: DC | PRN
  Administered 2014-10-27: 1 via OPHTHALMIC

## 2014-10-27 MED ORDER — METHOCARBAMOL 500 MG PO TABS
500.0000 mg | ORAL_TABLET | Freq: Four times a day (QID) | ORAL | Status: DC | PRN
  Administered 2014-10-27 – 2014-10-30 (×3): 500 mg via ORAL
  Filled 2014-10-27 (×2): qty 1

## 2014-10-27 MED ORDER — FENTANYL CITRATE (PF) 250 MCG/5ML IJ SOLN
INTRAMUSCULAR | Status: AC
  Filled 2014-10-27: qty 5

## 2014-10-27 MED ORDER — CIPROFLOXACIN HCL 250 MG PO TABS: 250.0000 mg | ORAL_TABLET | Freq: Two times a day (BID) | ORAL | Status: DC

## 2014-10-27 MED ORDER — ADULT MULTIVITAMIN W/MINERALS CH
1.0000 | ORAL_TABLET | Freq: Every day | ORAL | Status: DC
  Administered 2014-10-28 – 2014-10-31 (×4): 1 via ORAL
  Filled 2014-10-27 (×4): qty 1

## 2014-10-27 MED ORDER — DOCUSATE SODIUM 100 MG PO CAPS
100.0000 mg | ORAL_CAPSULE | Freq: Two times a day (BID) | ORAL | Status: DC
  Administered 2014-10-27 – 2014-10-31 (×8): 100 mg via ORAL
  Filled 2014-10-27 (×8): qty 1

## 2014-10-27 MED ORDER — METHOCARBAMOL 500 MG PO TABS
ORAL_TABLET | ORAL | Status: AC
  Filled 2014-10-27: qty 1

## 2014-10-27 MED ORDER — DEXAMETHASONE SODIUM PHOSPHATE 4 MG/ML IJ SOLN
INTRAMUSCULAR | Status: AC
  Filled 2014-10-27: qty 2

## 2014-10-27 MED ORDER — PHENYLEPHRINE HCL 10 MG/ML IJ SOLN
10.0000 mg | INTRAVENOUS | Status: DC | PRN
  Administered 2014-10-27: 15 ug/min via INTRAVENOUS

## 2014-10-27 MED ORDER — PHENOL 1.4 % MT LIQD: 1.0000 | OROMUCOSAL | Status: DC | PRN

## 2014-10-27 MED ORDER — METHOCARBAMOL 1000 MG/10ML IJ SOLN
500.0000 mg | Freq: Four times a day (QID) | INTRAVENOUS | Status: DC | PRN
  Filled 2014-10-27: qty 5

## 2014-10-27 MED ORDER — PHENYLEPHRINE 40 MCG/ML (10ML) SYRINGE FOR IV PUSH (FOR BLOOD PRESSURE SUPPORT)
PREFILLED_SYRINGE | INTRAVENOUS | Status: AC
  Filled 2014-10-27: qty 10

## 2014-10-27 MED ORDER — BUPIVACAINE HCL (PF) 0.5 % IJ SOLN
INTRAMUSCULAR | Status: DC | PRN
  Administered 2014-10-27: 10 mL

## 2014-10-27 MED ORDER — HYDROMORPHONE HCL 1 MG/ML IJ SOLN
0.5000 mg | INTRAMUSCULAR | Status: DC | PRN
  Administered 2014-10-27 – 2014-10-28 (×4): 1 mg via INTRAVENOUS
  Filled 2014-10-27 (×4): qty 1

## 2014-10-27 MED ORDER — POLYETHYLENE GLYCOL 3350 17 G PO PACK
17.0000 g | PACK | Freq: Every day | ORAL | Status: DC | PRN
  Administered 2014-10-29 – 2014-10-31 (×3): 17 g via ORAL
  Filled 2014-10-27 (×3): qty 1

## 2014-10-27 MED ORDER — SODIUM CHLORIDE 0.9 % IJ SOLN: 3.0000 mL | INTRAMUSCULAR | Status: DC | PRN

## 2014-10-27 MED ORDER — SUCCINYLCHOLINE CHLORIDE 20 MG/ML IJ SOLN
INTRAMUSCULAR | Status: DC | PRN
  Administered 2014-10-27: 50 mg via INTRAVENOUS

## 2014-10-27 MED ORDER — LEVOTHYROXINE SODIUM 50 MCG PO TABS: 150.0000 ug | ORAL_TABLET | Freq: Every day | ORAL | Status: DC

## 2014-10-27 MED ORDER — LIDOCAINE HCL (CARDIAC) 20 MG/ML IV SOLN
INTRAVENOUS | Status: DC | PRN
  Administered 2014-10-27: 80 mg via INTRAVENOUS

## 2014-10-27 MED ORDER — DIPHENHYDRAMINE HCL (SLEEP) 25 MG PO TABS: 50.0000 mg | ORAL_TABLET | Freq: Every evening | ORAL | Status: DC | PRN

## 2014-10-27 MED ORDER — VANCOMYCIN HCL 10 G IV SOLR
1250.0000 mg | Freq: Once | INTRAVENOUS | Status: AC
  Administered 2014-10-28: 1250 mg via INTRAVENOUS
  Filled 2014-10-27: qty 1250

## 2014-10-27 MED ORDER — ONDANSETRON HCL 4 MG/2ML IJ SOLN
INTRAMUSCULAR | Status: DC | PRN
  Administered 2014-10-27 (×2): 4 mg via INTRAVENOUS

## 2014-10-27 MED ORDER — PROPOFOL 10 MG/ML IV BOLUS
INTRAVENOUS | Status: AC
  Filled 2014-10-27: qty 20

## 2014-10-27 MED ORDER — DULOXETINE HCL 60 MG PO CPEP: 60.0000 mg | ORAL_CAPSULE | Freq: Every day | ORAL | Status: DC

## 2014-10-27 MED ORDER — BUPIVACAINE HCL (PF) 0.5 % IJ SOLN
10.0000 mL | INTRAMUSCULAR | Status: DC
  Filled 2014-10-27 (×2): qty 10

## 2014-10-27 MED ORDER — SODIUM CHLORIDE 0.9 % IJ SOLN
3.0000 mL | Freq: Two times a day (BID) | INTRAMUSCULAR | Status: DC
  Administered 2014-10-29 – 2014-10-31 (×5): 3 mL via INTRAVENOUS

## 2014-10-27 MED ORDER — ONDANSETRON HCL 4 MG/2ML IJ SOLN: 4.0000 mg | Freq: Once | INTRAMUSCULAR | Status: DC | PRN

## 2014-10-27 MED ORDER — PANTOPRAZOLE SODIUM 40 MG IV SOLR: 40.0000 mg | Freq: Every day | INTRAVENOUS | Status: DC

## 2014-10-27 MED ORDER — FLEET ENEMA 7-19 GM/118ML RE ENEM: 1.0000 | ENEMA | Freq: Once | RECTAL | Status: DC | PRN

## 2014-10-27 MED ORDER — FENTANYL CITRATE (PF) 100 MCG/2ML IJ SOLN
25.0000 ug | INTRAMUSCULAR | Status: DC | PRN
  Administered 2014-10-27 (×3): 50 ug via INTRAVENOUS

## 2014-10-27 MED ORDER — PHENYLEPHRINE HCL 10 MG/ML IJ SOLN
INTRAMUSCULAR | Status: DC | PRN
  Administered 2014-10-27: 80 ug via INTRAVENOUS

## 2014-10-27 MED ORDER — ROCURONIUM BROMIDE 50 MG/5ML IV SOLN
INTRAVENOUS | Status: AC
  Filled 2014-10-27: qty 1

## 2014-10-27 MED ORDER — ACETAMINOPHEN 325 MG PO TABS: 650.0000 mg | ORAL_TABLET | Freq: Four times a day (QID) | ORAL | Status: DC | PRN

## 2014-10-27 MED ORDER — PANTOPRAZOLE SODIUM 40 MG PO TBEC
40.0000 mg | DELAYED_RELEASE_TABLET | Freq: Every day | ORAL | Status: DC
  Administered 2014-10-28 – 2014-10-31 (×4): 40 mg via ORAL
  Filled 2014-10-27 (×4): qty 1

## 2014-10-27 MED ORDER — ALPRAZOLAM 0.25 MG PO TABS
0.2500 mg | ORAL_TABLET | Freq: Every evening | ORAL | Status: DC | PRN
  Administered 2014-10-29 – 2014-10-30 (×2): 0.25 mg via ORAL
  Filled 2014-10-27 (×2): qty 1

## 2014-10-27 MED ORDER — ALUM & MAG HYDROXIDE-SIMETH 200-200-20 MG/5ML PO SUSP
30.0000 mL | Freq: Four times a day (QID) | ORAL | Status: DC | PRN
  Administered 2014-10-28: 30 mL via ORAL
  Filled 2014-10-27: qty 30

## 2014-10-27 MED ORDER — VITAMIN E 180 MG (400 UNIT) PO CAPS
400.0000 [IU] | ORAL_CAPSULE | Freq: Every day | ORAL | Status: DC
  Administered 2014-10-28 – 2014-10-31 (×4): 400 [IU] via ORAL
  Filled 2014-10-27 (×4): qty 1

## 2014-10-27 MED ORDER — AMLODIPINE BESYLATE 5 MG PO TABS
5.0000 mg | ORAL_TABLET | Freq: Every day | ORAL | Status: DC
  Administered 2014-10-30 – 2014-10-31 (×2): 5 mg via ORAL
  Filled 2014-10-27 (×2): qty 1

## 2014-10-27 MED ORDER — LIDOCAINE-EPINEPHRINE 1 %-1:100000 IJ SOLN
INTRAMUSCULAR | Status: DC | PRN
  Administered 2014-10-27: 10 mL

## 2014-10-27 MED ORDER — METHOCARBAMOL 500 MG PO TABS: 500.0000 mg | ORAL_TABLET | Freq: Four times a day (QID) | ORAL | Status: DC | PRN

## 2014-10-27 MED ORDER — KCL IN DEXTROSE-NACL 20-5-0.45 MEQ/L-%-% IV SOLN
INTRAVENOUS | Status: DC
  Filled 2014-10-27: qty 1000

## 2014-10-27 MED ORDER — FENTANYL CITRATE (PF) 100 MCG/2ML IJ SOLN
INTRAMUSCULAR | Status: DC | PRN
  Administered 2014-10-27: 50 ug via INTRAVENOUS
  Administered 2014-10-27: 25 ug via INTRAVENOUS
  Administered 2014-10-27 (×2): 50 ug via INTRAVENOUS

## 2014-10-27 MED ORDER — PROPOFOL 10 MG/ML IV BOLUS
INTRAVENOUS | Status: DC | PRN
  Administered 2014-10-27 (×2): 50 mg via INTRAVENOUS

## 2014-10-27 MED ORDER — ACETAMINOPHEN 650 MG RE SUPP: 650.0000 mg | RECTAL | Status: DC | PRN

## 2014-10-27 MED ORDER — NABUMETONE 500 MG PO TABS
500.0000 mg | ORAL_TABLET | Freq: Two times a day (BID) | ORAL | Status: DC
  Filled 2014-10-27 (×4): qty 1

## 2014-10-27 MED ORDER — IRBESARTAN 300 MG PO TABS
300.0000 mg | ORAL_TABLET | Freq: Every day | ORAL | Status: DC
  Administered 2014-10-27 – 2014-10-30 (×3): 300 mg via ORAL
  Filled 2014-10-27 (×3): qty 1

## 2014-10-27 MED ORDER — BISACODYL 10 MG RE SUPP: 10.0000 mg | Freq: Every day | RECTAL | Status: DC | PRN

## 2014-10-27 MED ORDER — LEVOTHYROXINE SODIUM 137 MCG PO TABS
137.0000 ug | ORAL_TABLET | Freq: Every day | ORAL | Status: DC
  Administered 2014-10-28 – 2014-10-31 (×4): 137 ug via ORAL
  Filled 2014-10-27 (×8): qty 1

## 2014-10-27 MED ORDER — PROPOFOL 10 MG/ML IV BOLUS: INTRAVENOUS | Status: DC | PRN

## 2014-10-27 MED ORDER — ACETAMINOPHEN 325 MG PO TABS
650.0000 mg | ORAL_TABLET | ORAL | Status: DC | PRN
  Administered 2014-10-29 – 2014-10-31 (×3): 650 mg via ORAL
  Filled 2014-10-27 (×3): qty 2

## 2014-10-27 MED ORDER — ATORVASTATIN CALCIUM 40 MG PO TABS
40.0000 mg | ORAL_TABLET | Freq: Every day | ORAL | Status: DC
  Administered 2014-10-27 – 2014-10-30 (×4): 40 mg via ORAL
  Filled 2014-10-27 (×4): qty 1

## 2014-10-27 MED ORDER — LORATADINE 10 MG PO TABS
10.0000 mg | ORAL_TABLET | Freq: Every day | ORAL | Status: DC
  Administered 2014-10-28 – 2014-10-31 (×4): 10 mg via ORAL
  Filled 2014-10-27 (×4): qty 1

## 2014-10-27 SURGICAL SUPPLY — 85 items
BENZOIN TINCTURE PRP APPL 2/3 (GAUZE/BANDAGES/DRESSINGS) IMPLANT
BIT DRILL PLIF MAS 5.0MM DISP (DRILL) ×1 IMPLANT
BLADE CLIPPER SURG (BLADE) IMPLANT
BUR MATCHSTICK NEURO 3.0 LAGG (BURR) ×6 IMPLANT
BUR PRECISION FLUTE 5.0 (BURR) ×6 IMPLANT
CANISTER SUCT 3000ML PPV (MISCELLANEOUS) ×3 IMPLANT
CLIP NEUROVISION LG (CLIP) ×3 IMPLANT
CLOSURE WOUND 1/2 X4 (GAUZE/BANDAGES/DRESSINGS) ×1
CONT SPEC 4OZ CLIKSEAL STRL BL (MISCELLANEOUS) ×9 IMPLANT
COVER BACK TABLE 60X90IN (DRAPES) ×6 IMPLANT
DECANTER SPIKE VIAL GLASS SM (MISCELLANEOUS) ×3 IMPLANT
DERMABOND ADVANCED (GAUZE/BANDAGES/DRESSINGS) ×2
DERMABOND ADVANCED .7 DNX12 (GAUZE/BANDAGES/DRESSINGS) ×1 IMPLANT
DRAPE C-ARM 42X72 X-RAY (DRAPES) ×12 IMPLANT
DRAPE C-ARMOR (DRAPES) IMPLANT
DRAPE LAPAROTOMY 100X72X124 (DRAPES) ×3 IMPLANT
DRAPE POUCH INSTRU U-SHP 10X18 (DRAPES) ×6 IMPLANT
DRAPE SURG 17X23 STRL (DRAPES) ×3 IMPLANT
DRILL PLIF MAS 5.0MM DISP (DRILL) ×3
DRSG OPSITE POSTOP 4X8 (GAUZE/BANDAGES/DRESSINGS) ×3 IMPLANT
DURAPREP 26ML APPLICATOR (WOUND CARE) ×3 IMPLANT
ELECT BLADE 4.0 EZ CLEAN MEGAD (MISCELLANEOUS) ×3
ELECT REM PT RETURN 9FT ADLT (ELECTROSURGICAL) ×3
ELECTRODE BLDE 4.0 EZ CLN MEGD (MISCELLANEOUS) ×1 IMPLANT
ELECTRODE REM PT RTRN 9FT ADLT (ELECTROSURGICAL) ×1 IMPLANT
EVACUATOR 1/8 PVC DRAIN (DRAIN) IMPLANT
GAUZE SPONGE 4X4 12PLY STRL (GAUZE/BANDAGES/DRESSINGS) ×3 IMPLANT
GAUZE SPONGE 4X4 16PLY XRAY LF (GAUZE/BANDAGES/DRESSINGS) IMPLANT
GLOVE BIO SURGEON STRL SZ 6.5 (GLOVE) ×6 IMPLANT
GLOVE BIO SURGEON STRL SZ8 (GLOVE) ×9 IMPLANT
GLOVE BIO SURGEONS STRL SZ 6.5 (GLOVE) ×3
GLOVE BIOGEL PI IND STRL 6.5 (GLOVE) ×1 IMPLANT
GLOVE BIOGEL PI IND STRL 8 (GLOVE) ×3 IMPLANT
GLOVE BIOGEL PI IND STRL 8.5 (GLOVE) ×3 IMPLANT
GLOVE BIOGEL PI INDICATOR 6.5 (GLOVE) ×2
GLOVE BIOGEL PI INDICATOR 8 (GLOVE) ×6
GLOVE BIOGEL PI INDICATOR 8.5 (GLOVE) ×6
GLOVE ECLIPSE 8.0 STRL XLNG CF (GLOVE) ×9 IMPLANT
GLOVE EXAM NITRILE LRG STRL (GLOVE) IMPLANT
GLOVE EXAM NITRILE MD LF STRL (GLOVE) ×3 IMPLANT
GLOVE EXAM NITRILE XL STR (GLOVE) IMPLANT
GLOVE EXAM NITRILE XS STR PU (GLOVE) IMPLANT
GOWN STRL REUS W/ TWL LRG LVL3 (GOWN DISPOSABLE) ×2 IMPLANT
GOWN STRL REUS W/ TWL XL LVL3 (GOWN DISPOSABLE) ×6 IMPLANT
GOWN STRL REUS W/TWL 2XL LVL3 (GOWN DISPOSABLE) ×9 IMPLANT
GOWN STRL REUS W/TWL LRG LVL3 (GOWN DISPOSABLE) ×4
GOWN STRL REUS W/TWL XL LVL3 (GOWN DISPOSABLE) ×12
KIT BASIN OR (CUSTOM PROCEDURE TRAY) ×3 IMPLANT
KIT INFUSE SMALL (Orthopedic Implant) ×3 IMPLANT
KIT POSITION SURG JACKSON T1 (MISCELLANEOUS) ×3 IMPLANT
KIT ROOM TURNOVER OR (KITS) ×3 IMPLANT
MILL MEDIUM DISP (BLADE) ×6 IMPLANT
NEEDLE HYPO 25X1 1.5 SAFETY (NEEDLE) ×6 IMPLANT
NEEDLE SPNL 18GX3.5 QUINCKE PK (NEEDLE) ×6 IMPLANT
NS IRRIG 1000ML POUR BTL (IV SOLUTION) ×3 IMPLANT
PACK FOAM VITOSS 10CC (Orthopedic Implant) ×3 IMPLANT
PACK LAMINECTOMY NEURO (CUSTOM PROCEDURE TRAY) ×6 IMPLANT
PAD ARMBOARD 7.5X6 YLW CONV (MISCELLANEOUS) ×9 IMPLANT
PATTIES SURGICAL .5 X.5 (GAUZE/BANDAGES/DRESSINGS) IMPLANT
PATTIES SURGICAL .5 X1 (DISPOSABLE) IMPLANT
PATTIES SURGICAL 1X1 (DISPOSABLE) IMPLANT
PROBE BALL TIP NVM5 SNG USE (BALLOONS) ×3 IMPLANT
ROD 50MM LUMBAR (Rod) ×6 IMPLANT
SCREW LOCK (Screw) ×12 IMPLANT
SCREW LOCK FXNS SPNE MAS PL (Screw) ×6 IMPLANT
SCREW PLIF MAS 5.0X35 (Screw) ×12 IMPLANT
SCREW SHANK 5.0X30MM (Screw) ×3 IMPLANT
SCREW SHANK 5.0X35 (Screw) ×3 IMPLANT
SCREW TULIP 5.5 (Screw) ×6 IMPLANT
SPONGE LAP 4X18 X RAY DECT (DISPOSABLE) IMPLANT
SPONGE SURGIFOAM ABS GEL 100 (HEMOSTASIS) ×6 IMPLANT
STAPLER SKIN PROX WIDE 3.9 (STAPLE) IMPLANT
STRIP CLOSURE SKIN 1/2X4 (GAUZE/BANDAGES/DRESSINGS) ×2 IMPLANT
SUT VIC AB 1 CT1 18XBRD ANBCTR (SUTURE) ×3 IMPLANT
SUT VIC AB 1 CT1 8-18 (SUTURE) ×6
SUT VIC AB 2-0 CT1 18 (SUTURE) ×9 IMPLANT
SUT VIC AB 3-0 SH 8-18 (SUTURE) ×9 IMPLANT
SYR 20ML ECCENTRIC (SYRINGE) ×3 IMPLANT
SYR 3ML LL SCALE MARK (SYRINGE) ×12 IMPLANT
SYR 5ML LL (SYRINGE) IMPLANT
TOWEL OR 17X24 6PK STRL BLUE (TOWEL DISPOSABLE) ×3 IMPLANT
TOWEL OR 17X26 10 PK STRL BLUE (TOWEL DISPOSABLE) ×3 IMPLANT
TRAP SPECIMEN MUCOUS 40CC (MISCELLANEOUS) ×6 IMPLANT
TRAY FOLEY W/METER SILVER 14FR (SET/KITS/TRAYS/PACK) ×3 IMPLANT
WATER STERILE IRR 1000ML POUR (IV SOLUTION) ×3 IMPLANT

## 2014-10-27 NOTE — Interval H&P Note (Signed)
History and Physical Interval Note:  10/27/2014 7:29 AM  Latoya Stevenson  has presented today for surgery, with the diagnosis of Spinal stenosis, Lumbar region; Lumbar radiculopathy; Scoliosis, Idiopathic  The various methods of treatment have been discussed with the patient and family. After consideration of risks, benefits and other options for treatment, the patient has consented to  Procedure(s) with comments: L3-4 L4-5 Redo Laminectomy with Fusion, possibly interbody cages (N/A) - L3-4 L4-5 Redo Laminectomy with Fusion, possibly interbody cages as a surgical intervention .  [name] history has been reviewed, patient examined, no change in status, stable for surgery.  I have reviewed the patient's chart and labs.  Questions were answered to the patient's satisfaction.     Israel Wunder D

## 2014-10-27 NOTE — Anesthesia Postprocedure Evaluation (Signed)
  Anesthesia Post-op Note  Patient: Latoya Stevenson  Procedure(s) Performed: Procedure(s) (LRB): L3-4 L4-5 Redo Laminectomy with Fusion (N/A)  Patient Location: PACU  Anesthesia Type: General  Level of Consciousness: awake and alert   Airway and Oxygen Therapy: Patient Spontanous Breathing  Post-op Pain: mild  Post-op Assessment: Post-op Vital signs reviewed, Patient's Cardiovascular Status Stable, Respiratory Function Stable, Patent Airway and No signs of Nausea or vomiting  Last Vitals:  Filed Vitals:   10/27/14 1900  BP:   Pulse: 70  Temp:   Resp: 9    Post-op Vital Signs: stable   Complications: No apparent anesthesia complications

## 2014-10-27 NOTE — Anesthesia Preprocedure Evaluation (Addendum)
Anesthesia Evaluation  Patient identified by MRN, date of birth, ID band Patient awake    Reviewed: Allergy & Precautions, NPO status , Patient's Chart, lab work & pertinent test results  Airway Mallampati: II       Dental  (+) Upper Dentures, Lower Dentures   Pulmonary neg pulmonary ROS,  breath sounds clear to auscultation  Pulmonary exam normal       Cardiovascular Exercise Tolerance: Good hypertension, Pt. on medications negative cardio ROS Normal cardiovascular examRhythm:Regular Rate:Normal     Neuro/Psych  Headaches,    GI/Hepatic Neg liver ROS, GERD-  Medicated and Controlled,  Endo/Other  Hypothyroidism Obesity  Renal/GU negative Renal ROS     Musculoskeletal  (+) Arthritis -, Osteoarthritis,    Abdominal   Peds  Hematology  (+) Blood dyscrasia, anemia ,   Anesthesia Other Findings Day of surgery medications reviewed with the patient.  Reproductive/Obstetrics                            Anesthesia Physical Anesthesia Plan  ASA: II  Anesthesia Plan: General   Post-op Pain Management:    Induction: Intravenous  Airway Management Planned: Oral ETT  Additional Equipment:   Intra-op Plan:   Post-operative Plan: Extubation in OR  Informed Consent: I have reviewed the patients History and Physical, chart, labs and discussed the procedure including the risks, benefits and alternatives for the proposed anesthesia with the patient or authorized representative who has indicated his/her understanding and acceptance.   Dental advisory given  Plan Discussed with: CRNA and Anesthesiologist  Anesthesia Plan Comments: (Risks/benefits of general anesthesia discussed with patient including risk of damage to teeth, lips, gum, and tongue, nausea/vomiting, allergic reactions to medications, and the possibility of heart attack, stroke and death.  All patient questions answered.  Patient  wishes to proceed.)        Anesthesia Quick Evaluation

## 2014-10-27 NOTE — Op Note (Signed)
10/27/2014  6:26 PM  PATIENT:  Latoya Stevenson  74 y.o. 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  PROCEDURE:  Procedure(s) with comments: L3-4 L4-5 Redo Laminectomy with Fusion (N/A) - L3-4 L4-5 Redo Laminectomy with Fusion with pedicle screw fixation with posterolateral arthrodesis with Autograft, allograft, BMP    SURGEON:  Surgeon(s) and Role:    * Erline Levine, MD - Primary    * Consuella Lose, MD - Assisting  PHYSICIAN ASSISTANT:   ASSISTANTS: Poteat, RN   ANESTHESIA:   general  EBL:  Total I/O In: 1000 [I.V.:1000] Out: 650 [Urine:450; Blood:200]  BLOOD ADMINISTERED:none  DRAINS: none   LOCAL MEDICATIONS USED:  LIDOCAINE   SPECIMEN:  No Specimen  DISPOSITION OF SPECIMEN:  N/A  COUNTS:  YES  TOURNIQUET:  * No tourniquets in log *  DICTATION: Patient is 74 year old woman with spondylosis, scoliosis, recurrent stenosis, DDD, radiculopathy L34, L45. She has a severe right leg pain and weakness. It was elected to take her to surgery for redo decompression and fusion at L34 and L45 levels.    Procedure:   Following uncomplicated induction of GETA, and placement of electrodes for neural monitoring, patient was turned into a prone position on the Conner tableand using AP  fluoroscopy the area of planned incision was marked, prepped with betadine scrub and Duraprep, then draped. Exposure was performed of facet joint complex at L 34 and L  45  levels and the MAS retractor was placed.5.0 x 30 mm cortical Nuvasive screws were placed at L 3 bilaterally according to standard landmarks using neural monitoring.  A total redo laminectomy of L 3 and L 4 was then performed with disarticulation of facets.  Decompression was greater than for standard PLIF  procedure and thorough decompression of the thecal sac, bilateral L 3, L 4, L 5 nerve roots was performed with decompression of dense scar tissue from prior laminectomy. Along with foraminal and extraforaminal portions of these nerve roots.  This bone was saved for grafting and was run through bone mill and was placed in bone packing device.    It was elected not to place interbody cages because of the patient's advanced age and osteopenia.  Remaining screws were placed at L 4 and and L 5 and 55 mm rods were placed. And the screws were locked and torqued.Final Xrays showed well positioned implants and screw fixation. The posterolateral region bilaterally was packed with small BMP, Vitoss strip and autograft bilaterally. The wounds were irrigated and then closed with 1, 2-0 and 3-0 Vicryl stitches. 20 cc long-acting Marcaine was injected into the musculature.  Sterile occlusive dressing was placed with Dermabond and an occlusive dressing. The patient was then extubated in the operating room and taken to recovery in stable and satisfactory condition having tolerated her operation well. Counts were correct at the end of the case.  PLAN OF CARE: Admit to inpatient   PATIENT DISPOSITION:  PACU - hemodynamically stable.   Delay start of Pharmacological VTE agent (>24hrs) due to surgical blood loss or risk of bleeding: yes

## 2014-10-27 NOTE — OR Nursing (Signed)
Pt states she was fitted for a back brace 3 weeks ago. RN called [employer], they did not have an order for this patient.

## 2014-10-27 NOTE — Brief Op Note (Signed)
10/27/2014  6:26 PM  PATIENT:  Latoya Stevenson  74 y.o. 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  PROCEDURE:  Procedure(s) with comments: L3-4 L4-5 Redo Laminectomy with Fusion (N/A) - L3-4 L4-5 Redo Laminectomy with Fusion with pedicle screw fixation with posterolateral arthrodesis with Autograft, allograft, BMP    SURGEON:  Surgeon(s) and Role:    * Erline Levine, MD - Primary    * Consuella Lose, MD - Assisting  PHYSICIAN ASSISTANT:   ASSISTANTS: Poteat, RN   ANESTHESIA:   general  EBL:  Total I/O In: 1000 [I.V.:1000] Out: 650 [Urine:450; Blood:200]  BLOOD ADMINISTERED:none  DRAINS: none   LOCAL MEDICATIONS USED:  LIDOCAINE   SPECIMEN:  No Specimen  DISPOSITION OF SPECIMEN:  N/A  COUNTS:  YES  TOURNIQUET:  * No tourniquets in log *  DICTATION: Patient is 74 year old woman with spondylosis, scoliosis, recurrent stenosis, DDD, radiculopathy L34, L45. She has a severe right leg pain and weakness. It was elected to take her to surgery for redo decompression and fusion at L34 and L45 levels.    Procedure:   Following uncomplicated induction of GETA, and placement of electrodes for neural monitoring, patient was turned into a prone position on the [place] tableand using AP  fluoroscopy the area of planned incision was marked, prepped with betadine scrub and Duraprep, then draped. Exposure was performed of facet joint complex at L 34 and L  45  levels and the MAS retractor was placed.5.0 x 30 mm cortical Nuvasive screws were placed at L 3 bilaterally according to standard landmarks using neural monitoring.  A total redo laminectomy of L 3 and L 4 was then performed with disarticulation of facets.  Decompression was greater than for standard PLIF  procedure and thorough decompression of the thecal sac, bilateral L 3, L 4, L 5 nerve roots was performed with decompression of dense scar tissue from prior laminectomy. Along with foraminal and extraforaminal portions of these nerve roots.  This bone was saved for grafting and was run through bone mill and was placed in bone packing device.    It was elected not to place interbody cages because of the patient's advanced age and osteopenia.  Remaining screws were placed at L 4 and and L 5 and 55 mm rods were placed. And the screws were locked and torqued.Final Xrays showed well positioned implants and screw fixation. The posterolateral region bilaterally was packed with small BMP, Vitoss strip and autograft bilaterally. The wounds were irrigated and then closed with 1, 2-0 and 3-0 Vicryl stitches. 20 cc long-acting Marcaine was injected into the musculature.  Sterile occlusive dressing was placed with Dermabond and an occlusive dressing. The patient was then extubated in the operating room and taken to recovery in stable and satisfactory condition having tolerated her operation well. Counts were correct at the end of the case.  PLAN OF CARE: Admit to inpatient   PATIENT DISPOSITION:  PACU - hemodynamically stable.   Delay start of Pharmacological VTE agent (>24hrs) due to surgical blood loss or risk of bleeding: yes

## 2014-10-27 NOTE — Interval H&P Note (Signed)
History and Physical Interval Note:  10/27/2014 7:28 AM  Latoya Stevenson  has presented today for surgery, with the diagnosis of Spinal stenosis, Lumbar region; Lumbar radiculopathy; Scoliosis, Idiopathic  The various methods of treatment have been discussed with the patient and family. After consideration of risks, benefits and other options for treatment, the patient has consented to  Procedure(s) with comments: L3-4 L4-5 Redo Laminectomy with Fusion, possibly interbody cages (N/A) - L3-4 L4-5 Redo Laminectomy with Fusion, possibly interbody cages as a surgical intervention .  [name] history has been reviewed, patient examined, no change in status, stable for surgery.  I have reviewed the patient's chart and labs.  Questions were answered to the patient's satisfaction.     Lagina Reader D

## 2014-10-27 NOTE — Progress Notes (Signed)
ANTIBIOTIC CONSULT NOTE - INITIAL  Pharmacy Consult for Vancomycin x 1 Indication: Post-op surgical prophylaxis  Allergies  Allergen Reactions  . Celebrex [Celecoxib] Swelling  . Flagyl [Metronidazole Hcl] Swelling  . Macrodantin [Nitrofurantoin Macrocrystal] Swelling  . Penicillins Swelling   Patient Measurements: Height: 5' 3.5" (161.3 cm) Weight: 174 lb (78.926 kg) IBW/kg (Calculated) : 53.55  Vital Signs: Temp: 97.5 F (36.4 C) (08/12 1930) Temp Source: Oral (08/12 0832) BP: 105/61 mmHg (08/12 1912) Pulse Rate: 71 (08/12 1935)  Assessment: 74 yo female s/p laminectomy/fusion.  She received preop IV Vancomycin without noted complications.  We've been asked to provide one dose of IV Vancomycin post-op for ongoing surgical prophylaxis.  Her creatinine is normal with an estimated crcl of ~ 56ml/min.  Plan:  -Vancomycin 1250mg  IV x 1 (12 hours post-op) - Please let us know if we can provide further assistance in the care of this patient.  Rober Minion, PharmD., MS Clinical Pharmacist Pager:  [phone] Thank you for allowing pharmacy to be part of this patients care team. 10/27/2014,8:14 PM

## 2014-10-27 NOTE — Transfer of Care (Signed)
Immediate Anesthesia Transfer of Care Note  Patient: Latoya Stevenson  Procedure(s) Performed: Procedure(s) with comments: L3-4 L4-5 Redo Laminectomy with Fusion (N/A) - L3-4 L4-5 Redo Laminectomy with Fusion  Patient Location: PACU  Anesthesia Type:General  Level of Consciousness: awake, alert , oriented and patient cooperative  Airway & Oxygen Therapy: Patient Spontanous Breathing and Patient connected to nasal cannula oxygen  Post-op Assessment: Report given to RN and Post -op Vital signs reviewed and stable  Post vital signs: Reviewed and stable  Last Vitals:  Filed Vitals:   10/27/14 0832  BP: 136/81  Pulse: 73  Temp: 36.7 C  Resp: 20    Complications: No apparent anesthesia complications

## 2014-10-27 NOTE — H&P (Signed)
Patient ID:   [id number] Patient: Latoya Stevenson  Date of Birth: [birth year] Visit Type: Office Visit   Date: 10/11/2014 01:15 PM Provider: Marchia Meiers. Vertell Limber MD   This 74 year old female presents for back pain and Leg pain.  History of Present Illness: 1.  back pain  2.  Leg pain  Patient had previous lumbar laminectomy L3 through L5 in September 2013.  She is now complaining of low back and right leg pain.  MRI demonstrates severe right L4 foraminal stenosis along with scoliosis.  Her bone density studies show osteopenia according to WHO criteria  Based on my review the patient's imaging studies and her significant pain I have recommended that she undergo redo L3 L4 and L4 L5 laminectomy with fusion.  This may involve interbody prosthesis and also this may not be performed based on my assessment of the patient's bone quality during surgery.  Risks and benefits were discussed in detail with the patient and she wishes to proceed.  She will be fitted for an LSO brace.      Medical/Surgical/Interim History Reviewed, no change.  Last detailed document date:09/13/2014.   PAST MEDICAL HISTORY, SURGICAL HISTORY, FAMILY HISTORY, SOCIAL HISTORY AND REVIEW OF SYSTEMS I have reviewed the patient's past medical, surgical, family and social history as well as the comprehensive review of systems as included on the [facility] history form dated 09/13/2014, which I have signed.  Family History: Reviewed, no changes.  Last detailed document: 09/13/2014.   Social History: Tobacco use reviewed. Reviewed, no changes. Last detailed document date: 09/13/2014.      MEDICATIONS(added, continued or stopped this visit): Started Medication Directions Instruction Stopped   amlodipine 5 mg tablet Take as directed     atorvastatin 40 mg tablet Take as directed     calcium Take as directed     diphenhydramine 25 mg tablet Take as directed     fexofenadine 180 mg tablet Take as  directed     Flonase 50 mcg/actuation nasal spray,suspension Use as directed     irbesartan 300 mg tablet Take as directed     levothyroxine 150 mcg tablet Take as directed     multivitamin with minerals Take as directed     nabumetone 500 mg tablet take 1 tablet by oral route 2 times every day     omeprazole 20 mg capsule,delayed release Take as directed     Osteo Bi-Flex Take as directed    10/11/2014 Percocet 5 mg-325 mg tablet take 1 tablet by oral route 3 times every day as needed     Sleep Aid (doxylamine) 25 mg tablet Take as directed     Tylenol 325 mg tablet Take as directed     Vitamin D3 5,000 unit tablet Take as directed     vitamin E 400 unit capsule Take as directed       ALLERGIES: Ingredient Reaction Medication Name Comment  NITROFURANTOIN MACROCRYSTAL Swelling Macrodantin   CELECOXIB Swelling Celebrex   PENICILLINS Unknown    METRONIDAZOLE Swelling Flagyl    Reviewed, no changes.    Vitals Date Temp F BP Pulse Ht In Wt Lb BMI BSA Pain Score  10/11/2014  151/84 79 63 175 31  8/10      IMPRESSION Severe spinal stenosis with scoliosis L3 L4 and L4 L5 levels  Completed Orders (this encounter) Order Details Reason Side Interpretation Result Initial Treatment Date Region  Hypertension education Follow up with primary care physician.  Lifestyle education regarding diet Encouraged to eat a well balanced diet and follow up with primary care physician.         Assessment/Plan # Detail Type Description   1. 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       2. Assessment Spinal stenosis, lumbar region (M48.06).       3. Assessment Lumbar radiculopathy (M54.16).       4. Assessment Scoliosis (and kyphoscoliosis), idiopathic (M41.20).       5. Assessment Essential (primary) hypertension (I10).       6. Assessment Body mass index (BMI) 31.0-31.9, adult (Z68.31).   Plan Orders Today's instructions / counseling include(s) Lifestyle education  regarding diet.         Pain Assessment/Treatment Pain Scale: 8/10. Method: Numeric Pain Intensity Scale. Location: back/leg. Onset: 10/10/2013. Duration: varies. Quality: discomforting. Pain Assessment/Treatment follow-up plan of care: Patient is taking medications as prescribed..  Fall Risk Plan The patient has not fallen in the last year.  Redo Decompression L3 L4 and L4 L5 levels with fusion.  Orders: Diagnostic Procedures: Assessment Procedure  M48.06 L3-L4, L4-L5 redo laminectomy with fusion, possible interbody cages  M48.06 LSO Brace  M48.06 Lumbar Spine- AP/Lat  Instruction(s)/Education: Assessment Instruction  I10 Hypertension education  Z68.31 Lifestyle education regarding diet    MEDICATIONS PRESCRIBED TODAY    Rx Quantity Refills  PERCOCET 5 mg-325 mg  60 0            Provider:  Marchia Meiers. Vertell Limber MD  10/21/2014 04:30 PM Dictation edited by: Marchia Meiers. Vertell Limber    CC Providers: [place], [place] [id number]              Electronically signed by Marchia Meiers Vertell Limber MD on 10/21/2014 04:31 PM

## 2014-10-27 NOTE — Progress Notes (Signed)
Awake, alert, conversant.  Sore in back and right hip.  Strength full bilateral lower extremities.

## 2014-10-28 LAB — CBC
HCT: 35.3 % — ABNORMAL LOW (ref 36.0–46.0)
Hemoglobin: 11.4 g/dL — ABNORMAL LOW (ref 12.0–15.0)
MCH: 29.9 pg (ref 26.0–34.0)
MCHC: 32.3 g/dL (ref 30.0–36.0)
MCV: 92.7 fL (ref 78.0–100.0)
Platelets: 133 10*3/uL — ABNORMAL LOW (ref 150–400)
RBC: 3.81 MIL/uL — ABNORMAL LOW (ref 3.87–5.11)
RDW: 14.6 % (ref 11.5–15.5)
WBC: 9.7 10*3/uL (ref 4.0–10.5)

## 2014-10-28 MED ORDER — SODIUM CHLORIDE 0.9 % IV BOLUS (SEPSIS)
1000.0000 mL | Freq: Once | INTRAVENOUS | Status: AC
  Administered 2014-10-28: 1000 mL via INTRAVENOUS

## 2014-10-28 NOTE — Progress Notes (Addendum)
Patient trending down on her blood pressures; oob twice today with therapy; denies chest pain, shortness of breath, but is sleepy; reports pain 8/10 when moved; caution used with prn medications due to low blood pressures and lethargy post op;patient assisted back in bed; O2 at 2l; Dr.Nundkumar paged;orders received to check CBC and give 1062ml  IV NS fluid bolus; Patient without acute distress; arousable;follows commands. Will monitor vital signs.

## 2014-10-28 NOTE — Evaluation (Addendum)
Occupational Therapy Evaluation Patient Details Name: Latoya Stevenson MRN: [id number] DOB: 08-Aug-1940 Today's Date: 10/28/2014    History of Present Illness 74 y.o. s/p L3-4 L4-5 Redo Laminectomy with Fusion   Clinical Impression   Pt s/p above. Pt independent with ADLs, PTA. Feel pt will benefit from acute OT to increase independence prior to d/c. Recommending SNF for rehab as pt lives alone.    Follow Up Recommendations  SNF    Equipment Recommendations  Other (comment) (defer to next venue)    Recommendations for Other Services       Precautions / Restrictions Precautions Precautions: Back;Fall Precaution Booklet Issued: No Precaution Comments: educated on back precautions Required Braces or Orthoses: Spinal Brace Spinal Brace: Lumbar corset;Applied in sitting position Restrictions Weight Bearing Restrictions: No      Mobility Bed Mobility Overal bed mobility: Needs Assistance Bed Mobility: Rolling;Sidelying to Sit Rolling: Min assist Sidelying to sit: Min assist       General bed mobility comments: cues for technique. Assist to roll and to lift trunk to come to sitting position.  Transfers Overall transfer level: Needs assistance Equipment used: 1 person hand held assist (and she also held to IV pole for support) Transfers: Sit to/from [facility] Sit to Stand: Mod assist Stand pivot transfers: Min assist            Balance  Min assist for stand pivot; OT assisted with standing balance initially and pt also used IV pole.                                          ADL Overall ADL's : Needs assistance/impaired Eating/Feeding: Independent;Sitting               Upper Body Dressing : Minimal assistance;Sitting   Lower Body Dressing: Maximal assistance;Sit to/from stand   Toilet Transfer: Minimal assistance;Stand-pivot;Moderate assistance (from bed to chair; Mod assist for sit to stand)           Functional  mobility during ADLs: Minimal assistance (stand pivot) General ADL Comments: Educated on back brace.      Vision     Perception     Praxis      Pertinent Vitals/Pain Pain Assessment: 0-10 Pain Score:  (9.5) Pain Location: back Pain Descriptors / Indicators: Aching;Pressure Pain Intervention(s): Limited activity within patient's tolerance;Repositioned;Monitored during session;Premedicated before session     Hand Dominance     Extremity/Trunk Assessment Upper Extremity Assessment Upper Extremity Assessment: Overall WFL for tasks assessed   Lower Extremity Assessment Lower Extremity Assessment: Defer to PT evaluation       Communication Communication Communication: No difficulties   Cognition Arousal/Alertness: Lethargic;Suspect due to medications Behavior During Therapy: WFL for tasks assessed/performed Overall Cognitive Status: Within Functional Limits for tasks assessed                     General Comments       Exercises       Shoulder Instructions      Home Living Family/patient expects to be discharged to:: Skilled nursing facility Living Arrangements: Alone   Type of Home: House Home Access: Stairs to enter Entrance Stairs-Number of Steps: 4 Entrance Stairs-Rails: Right Home Layout: One level     Bathroom Shower/Tub: Tub/shower unit         Home Equipment: Environmental consultant - 2 wheels;Bedside commode  Prior Functioning/Environment Level of Independence: Independent             OT Diagnosis: Acute pain   OT Problem List: Pain;Decreased knowledge of precautions;Decreased knowledge of use of DME or AE;Impaired balance (sitting and/or standing);Decreased activity tolerance;Decreased strength;Decreased range of motion   OT Treatment/Interventions: Self-care/ADL training;DME and/or AE instruction;Therapeutic activities;Patient/family education;Balance training    OT Goals(Current goals can be found in the care plan section) Acute  Rehab OT Goals Patient Stated Goal: not stated OT Goal Formulation: With patient Time For Goal Achievement: 11/04/14 Potential to Achieve Goals: Good  OT Frequency: Min 2X/week   Barriers to D/C:            Co-evaluation              End of Session Equipment Utilized During Treatment: Gait belt;Back brace; Oxygen placed back on towards end of session Nurse Communication: Mobility status;Other (comment) (no walker in room; would be good to place another pillow behind pt's back)  Activity Tolerance: Patient limited by pain Patient left: in chair;with call bell/phone within reach;with chair alarm set   Time: 1142-1200 OT Time Calculation (min): 18 min Charges:  OT General Charges $OT Visit: 1 Procedure OT Evaluation $Initial OT Evaluation Tier I: 1 Procedure G-CodesBenito Mccreedy OTR/L [phone] 10/28/2014, 12:24 PM

## 2014-10-28 NOTE — Progress Notes (Signed)
Pt arrived on unit rm 4N14 2005 hrs A&O, no obvious distress, IV LR left hand KVO, Honeycomb dressing lower back  Intact, Pt oriented to room and equipment, transfer orders being implemented.

## 2014-10-28 NOTE — Evaluation (Signed)
Physical Therapy Evaluation Patient Details Name: Latoya Stevenson MRN: [id number] DOB: [birth year] Today's Date: 10/28/2014   History of Present Illness  74 y.o. s/p L3-4 L4-5 Redo Laminectomy with Fusion  Clinical Impression  Patient is s/p above procedure presenting with functional limitations due to the deficits listed below (see PT Problem List). Tolerated bed mobility, transfer, and gait training. Requires Min assist for most tasks. Tolerated exercises well. Lives alone with limited support. Will benefit from ST-SNF to regain independence and safety with mobility prior to returning home alone. Patient will benefit from skilled PT to increase their independence and safety with mobility to allow discharge to the venue listed below.       Follow Up Recommendations SNF;Supervision/Assistance - 24 hour    Equipment Recommendations  None recommended by PT    Recommendations for Other Services       Precautions / Restrictions Precautions Precautions: Back;Fall Precaution Booklet Issued: Yes (comment) Precaution Comments: educated on back precautions Required Braces or Orthoses: Spinal Brace Spinal Brace: Lumbar corset;Applied in sitting position Restrictions Weight Bearing Restrictions: No      Mobility  Bed Mobility Overal bed mobility: Needs Assistance Bed Mobility: Rolling;Sidelying to Sit Rolling: Min guard Sidelying to sit: Min assist       General bed mobility comments: Min guard to roll with cues for log roll technique. Min assist at end range for transition to seated position. Heavy use of rail.  Transfers Overall transfer level: Needs assistance Equipment used: Rolling walker (2 wheeled) Transfers: Sit to/from Stand Sit to Stand: Min assist         General transfer comment: Min assist for boost to stand from bed and recliner. VC for hand placement and to maintain back precautions.  Ambulation/Gait Ambulation/Gait assistance: Min guard Ambulation Distance  (Feet): 80 Feet Assistive device: Rolling walker (2 wheeled) Gait Pattern/deviations: Step-through pattern;Decreased stride length;Trunk flexed Gait velocity: slow Gait velocity interpretation: <1.8 ft/sec, indicative of risk for recurrent falls General Gait Details: Educated on safe DME use with a rolling walker. VC for back precautions and upright posture with forward gaze. No overt loss of balance noted, Close guard for safety. Pt slow and guarded.   Stairs            Wheelchair Mobility    Modified Rankin (Stroke Patients Only)       Balance Overall balance assessment: Needs assistance Sitting-balance support: No upper extremity supported;Feet supported Sitting balance-Leahy Scale: Fair     Standing balance support: Single extremity supported Standing balance-Leahy Scale: Poor                               Pertinent Vitals/Pain Pain Assessment: 0-10 Pain Score:  ("not as bad as earlier") Pain Location: back Pain Descriptors / Indicators: Aching Pain Intervention(s): Monitored during session;Repositioned    Home Living Family/patient expects to be discharged to:: Skilled nursing facility Living Arrangements: Alone   Type of Home: House Home Access: Stairs to enter Entrance Stairs-Rails: Right Entrance Stairs-Number of Steps: 4 Home Layout: One level Home Equipment: Walker - 2 wheels;Bedside commode      Prior Function Level of Independence: Independent               Hand Dominance        Extremity/Trunk Assessment   Upper Extremity Assessment: Defer to OT evaluation           Lower Extremity Assessment: Generalized weakness  Communication   Communication: No difficulties  Cognition Arousal/Alertness: Awake/alert Behavior During Therapy: WFL for tasks assessed/performed Overall Cognitive Status: Within Functional Limits for tasks assessed                      General Comments General comments (skin  integrity, edema, etc.): Reviewed application of brace, safety with mobility, and precautions.    Exercises General Exercises - Lower Extremity Ankle Circles/Pumps: AROM;Both;10 reps;Seated Long Arc Quad: Strengthening;Both;10 reps;Seated      Assessment/Plan    PT Assessment Patient needs continued PT services  PT Diagnosis Difficulty walking;Abnormality of gait;Generalized weakness;Acute pain   PT Problem List Decreased strength;Decreased range of motion;Decreased activity tolerance;Decreased balance;Decreased mobility;Decreased knowledge of use of DME;Decreased knowledge of precautions;Pain  PT Treatment Interventions DME instruction;Gait training;Functional mobility training;Therapeutic activities;Therapeutic exercise;Balance training;Neuromuscular re-education;Patient/family education;Modalities   PT Goals (Current goals can be found in the Care Plan section) Acute Rehab PT Goals Patient Stated Goal: Get therapy before I go home PT Goal Formulation: With patient Time For Goal Achievement: 11/11/14 Potential to Achieve Goals: Good    Frequency Min 5X/week   Barriers to discharge Decreased caregiver support lives alone    Co-evaluation               End of Session Equipment Utilized During Treatment: Back brace;Gait belt Activity Tolerance: Patient tolerated treatment well Patient left: in chair;with call bell/phone within reach;with chair alarm set;with family/visitor present Nurse Communication: Mobility status         Time: 1543-1611 PT Time Calculation (min) (ACUTE ONLY): 28 min   Charges:   PT Evaluation $Initial PT Evaluation Tier I: 1 Procedure PT Treatments $Gait Training: 8-22 mins $Therapeutic Activity: 8-22 mins   PT G CodesEllouise Stevenson 10/28/2014, 5:24 PM  Latoya Stevenson, Latoya Stevenson

## 2014-10-28 NOTE — Progress Notes (Signed)
Nauseated; medicated prn; BP improved at 104/60; patient is alert; son visiting at bedside. Continue to monitor intake and output and vital signs.

## 2014-10-28 NOTE — Progress Notes (Signed)
OT Cancellation Note  Patient Details Name: Latoya Stevenson MRN: [id number] DOB: 09-21-1940   Cancelled Treatment:    Reason Eval/Treat Not Completed: Other (comment) Spoke with nurse and she requested that OT return as pt has low BP.  Benito Mccreedy OTR/L [phone] 10/28/2014, 10:09 AM

## 2014-10-28 NOTE — Progress Notes (Signed)
Pt seen and examined. No issues overnight.   EXAM: Temp:  [97.4 F (36.3 C)-98.3 F (36.8 C)] 97.7 F (36.5 C) (08/13 0928) Pulse Rate:  [64-86] 85 (08/13 0928) Resp:  [12-29] 20 (08/13 0928) BP: (81-123)/(53-70) 90/58 mmHg (08/13 1006) SpO2:  [96 %-100 %] 98 % (08/13 0928) Intake/Output      08/12 0701 - 08/13 0700 08/13 0701 - 08/14 0700   I.V. (mL/kg) 1500 (19)    Total Intake(mL/kg) 1500 (19)    Urine (mL/kg/hr) 780    Blood 200    Total Output 980     Net +520           Awake, alert, oriented Motor 5/5 BLE Sensation intact to light touch Incision c/d/i with dressing  LABS: Lab Results  Component Value Date   CREATININE 0.90 10/18/2014   BUN 17 10/18/2014   NA 140 10/18/2014   K 4.2 10/18/2014   CL 108 10/18/2014   CO2 25 10/18/2014   Lab Results  Component Value Date   WBC 5.0 10/18/2014   HGB 13.0 10/18/2014   HCT 39.8 10/18/2014   MCV 90.0 10/18/2014   PLT 170 10/18/2014    IMPRESSION: - 74 y.o. female POD 1 s/p L3-4 and redo L4-5 fusion. Neurologically intact.  Doing well.  PLAN: - D/c foley, encourage ambulation, PT.

## 2014-10-28 NOTE — Care Management Note (Signed)
Case Management Note  Patient Details  Name: Latoya Stevenson MRN: [id number] Date of Birth: [birth year]  Subjective/Objective:                    Action/Plan:   Expected Discharge Date:                  Expected Discharge Plan:     In-House Referral:     Discharge planning Services     Post Acute Care Choice:    Choice offered to:     DME Arranged:    DME Agency:     HH Arranged:    [place] Agency:     Status of Service:     Medicare Important Message Given:   yes Date Medicare IM Given:   10/28/14 Medicare IM give by:   Frann Rider, RN, BSN Date Additional Medicare IM Given:    Additional Medicare Important Message give by:     If discussed at [place] of Stay Meetings, dates discussed:    Additional Comments:  Norina Buzzard, RN 10/28/2014, 3:07 PM

## 2014-10-29 MED ORDER — HYDROMORPHONE HCL 2 MG PO TABS
1.0000 mg | ORAL_TABLET | Freq: Four times a day (QID) | ORAL | Status: DC | PRN
  Administered 2014-10-29 – 2014-10-31 (×8): 1 mg via ORAL
  Filled 2014-10-29 (×8): qty 1

## 2014-10-29 NOTE — Progress Notes (Signed)
Physical Therapy Treatment Patient Details Name: Latoya Stevenson MRN: [id number] DOB: 1940-07-10 Today's Date: 10/29/2014    History of Present Illness 74 y.o. s/p L3-4 L4-5 Redo Laminectomy with Fusion    PT Comments    Progressing towards goals. Needs cues for safety, min assist for bed mobility and transfers. Tolerating gait training well. Encouraged to get OOB more frequently throughout the day. Patient will continue to benefit from skilled physical therapy services to further improve independence with functional mobility.   Follow Up Recommendations  SNF;Supervision/Assistance - 24 hour     Equipment Recommendations  None recommended by PT    Recommendations for Other Services       Precautions / Restrictions Precautions Precautions: Back;Fall Precaution Comments: Reviewed back precautions Required Braces or Orthoses: Spinal Brace Spinal Brace: Lumbar corset;Applied in sitting position Restrictions Weight Bearing Restrictions: No    Mobility  Bed Mobility Overal bed mobility: Needs Assistance Bed Mobility: Rolling;Sidelying to Sit Rolling: Min guard Sidelying to sit: Min assist       General bed mobility comments: Using rail to roll. VC for technique. Min assist to rise initially today. Cues to maintain precautions  Transfers Overall transfer level: Needs assistance Equipment used: Rolling walker (2 wheeled) Transfers: Sit to/from Stand Sit to Stand: Min assist         General transfer comment: Min assist for boost to stand from bed. VC for hand placement and to maintain back precautions  Ambulation/Gait Ambulation/Gait assistance: Min guard Ambulation Distance (Feet): 105 Feet Assistive device: Rolling walker (2 wheeled) Gait Pattern/deviations: Step-through pattern;Decreased stride length;Shuffle;Trunk flexed Gait velocity: slow Gait velocity interpretation: <1.8 ft/sec, indicative of risk for recurrent falls General Gait Details: VC for foot  clearance initially which she corrected for remainder of bout. Cues intermittently for upright posture. No loss of balance noted with RW for support. Slow and guarded.   Stairs            Wheelchair Mobility    Modified Rankin (Stroke Patients Only)       Balance                                    Cognition Arousal/Alertness: Awake/alert Behavior During Therapy: WFL for tasks assessed/performed Overall Cognitive Status: Within Functional Limits for tasks assessed                      Exercises      General Comments General comments (skin integrity, edema, etc.): Still requires cues for safety and precautions.      Pertinent Vitals/Pain Pain Assessment: 0-10 Pain Score:  ("It's okay right now" no value given) Pain Location: back Pain Descriptors / Indicators: Aching Pain Intervention(s): Monitored during session;Repositioned    Home Living                      Prior Function            PT Goals (current goals can now be found in the care plan section) Acute Rehab PT Goals Patient Stated Goal: Get therapy before I go home PT Goal Formulation: With patient Time For Goal Achievement: 11/11/14 Potential to Achieve Goals: Good Progress towards PT goals: Progressing toward goals    Frequency  Min 5X/week    PT Plan Current plan remains appropriate    Co-evaluation             End  of Session Equipment Utilized During Treatment: Back brace Activity Tolerance: Patient tolerated treatment well Patient left: in chair;with call bell/phone within reach;with chair alarm set     Time: 1131-1151 PT Time Calculation (min) (ACUTE ONLY): 20 min  Charges:  $Gait Training: 8-22 mins                    G Codes:      Ellouise Newer November 27, 2014, 12:33 PM  Camille Bal [facility], [place]

## 2014-10-29 NOTE — Progress Notes (Signed)
No issues overnight. Pt has back pain, says vicodin/norco is ineffective. She becomes somewhat sedated with IV dilaudid.  EXAM:  BP 109/55 mmHg  Pulse 85  Temp(Src) 98 F (36.7 C) (Oral)  Resp 20  Ht 5' 3.5" (1.613 m)  Wt 78.926 kg (174 lb)  BMI 30.34 kg/m2  SpO2 99%  Awake, alert, oriented  Speech fluent, appropriate  CN grossly intact  5/5 BUE/BLE  Wound c/d/i  IMPRESSION:  74 y.o. female POD#2 s/p L3-5 fusion, recovering slowly  PLAN: - D/C foley - Will change pain meds to dilaudid PO - Cont to mobilize

## 2014-10-30 NOTE — Progress Notes (Signed)
Physical Therapy Treatment Patient Details Name: Latoya Stevenson MRN: [id number] DOB: 04/25/1940 Today's Date: 10/30/2014    History of Present Illness 74 y.o. s/p L3-4 L4-5 Redo Laminectomy with Fusion    PT Comments    Patient progressing with mobility but continues to require physical assist for transfers and both verbal and tactile cues for safety and compliance PY:KDXI precautions. Patient tolerated increased distance with ambulation but did require several standing rest breaks. Continue to recommend ST SNF upon acute discharge, patient in agreement. Will continue to see and progress as tolerated.   Follow Up Recommendations  SNF;Supervision/Assistance - 24 hour     Equipment Recommendations  None recommended by PT    Recommendations for Other Services       Precautions / Restrictions Precautions Precautions: Back;Fall Precaution Comments: Reviewed back precautions Required Braces or Orthoses: Spinal Brace Spinal Brace: Lumbar corset;Applied in sitting position Restrictions Weight Bearing Restrictions: No    Mobility  Bed Mobility Overal bed mobility: Needs Assistance Bed Mobility: Rolling;Sit to Sidelying Rolling: Min guard       Sit to sidelying: Min assist General bed mobility comments: Assist to elevate LEs back to bed, VCs for positioning and cues for technique  Transfers Overall transfer level: Needs assistance Equipment used: Rolling walker (2 wheeled) Transfers: Sit to/from Stand Sit to Stand: Min assist         General transfer comment: Performed x3 during session with increased time to elevate, assist required to power up and maintain stability. VCs for hand placement and safety  Ambulation/Gait Ambulation/Gait assistance: Min guard Ambulation Distance (Feet): 140 Feet Assistive device: Rolling walker (2 wheeled) Gait Pattern/deviations: Step-through pattern;Decreased stride length;Shuffle;Trunk flexed Gait velocity: decreased Gait velocity  interpretation: Below normal speed for age/gender General Gait Details: VCs for increased cadence and step through gait, 3 standing rest breaks required with cues for relaxation.    Stairs            Wheelchair Mobility    Modified Rankin (Stroke Patients Only)       Balance   Sitting-balance support: Feet supported Sitting balance-Leahy Scale: Fair     Standing balance support: Bilateral upper extremity supported;During functional activity Standing balance-Leahy Scale: Poor                      Cognition Arousal/Alertness: Awake/alert Behavior During Therapy: WFL for tasks assessed/performed Overall Cognitive Status: Within Functional Limits for tasks assessed                      Exercises      General Comments General comments (skin integrity, edema, etc.): continues to requrie cues for compliance and safety.       Pertinent Vitals/Pain Pain Assessment: 0-10 Pain Score: 6  Pain Location: back Pain Descriptors / Indicators: Sore Pain Intervention(s): Monitored during session;Repositioned;Relaxation    Home Living                      Prior Function            PT Goals (current goals can now be found in the care plan section) Acute Rehab PT Goals Patient Stated Goal: Get therapy before I go home PT Goal Formulation: With patient Time For Goal Achievement: 11/11/14 Potential to Achieve Goals: Good Progress towards PT goals: Progressing toward goals    Frequency  Min 5X/week    PT Plan Current plan remains appropriate    Co-evaluation  End of Session Equipment Utilized During Treatment: Back brace Activity Tolerance: Patient tolerated treatment well Patient left: in bed;with call bell/phone within reach;with bed alarm set;with family/visitor present;with SCD's reapplied     Time: 0902-0927 PT Time Calculation (min) (ACUTE ONLY): 25 min  Charges:  $Gait Training: 8-22 mins $Therapeutic Activity:  8-22 mins                    G CodesDuncan Dull November 05, 2014, 10:32 AM Alben Deeds, PT DPT  [phone]

## 2014-10-30 NOTE — Clinical Social Work Note (Signed)
Patient has a bed at [facility]. CSW will continue to follow patient and pt's family for continued support and to facilitate with pt's discharge needs once medically stable.  FL-2 on chart for MD signature.   Glendon Axe, MSW, LCSWA [phone] 10/30/2014 2:41 PM

## 2014-10-30 NOTE — Progress Notes (Signed)
Utilization review completed.  

## 2014-10-30 NOTE — Progress Notes (Signed)
Subjective: Patient reports "I don't know why I just don't have any energy"  Objective: Vital signs in last 24 hours: Temp:  [97.8 F (36.6 C)-98.5 F (36.9 C)] 97.8 F (36.6 C) (08/15 0435) Pulse Rate:  [85-97] 97 (08/15 0435) Resp:  [18-20] 18 (08/15 0435) BP: (104-135)/(55-83) 135/62 mmHg (08/15 0435) SpO2:  [91 %-99 %] 96 % (08/15 0435)  Intake/Output from previous day: 08/14 0701 - 08/15 0700 In: -  Out: 300 [Urine:300] Intake/Output this shift:    Pt up in chair eating breakfast. Reports lumbar pain with position changes. Honeycomb drsg intact, without erythema, swelling, or drainage. Good strength BLE. Reports fatigue, and acknowledges issues over weekend MB:WGYK control vs hypotension. No buttock or leg pain.  Lab Results:  Recent Labs  10/28/14 1920  WBC 9.7  HGB 11.4*  HCT 35.3*  PLT 133*   BMET No results for input(s): NA, K, CL, CO2, GLUCOSE, BUN, CREATININE, CALCIUM in the last 72 hours.  Studies/Results: No results found.  Assessment/Plan: Improving slowly   LOS: 3 days  Hopeful of CIR. Pt requests [facility] in [place] if CIR not available.  Continue to mobilize in LSO with PT.    Latoya Stevenson 10/30/2014, 7:50 AM

## 2014-10-30 NOTE — Clinical Social Work Note (Signed)
Clinical Social Work Assessment  Patient Details  Name: CAMYLA CAMPOSANO MRN: [id number] Date of Birth: 11/12/1940  Date of referral:  10/30/14               Reason for consult:  Facility Placement                Permission sought to share information with:  Family Supports, Customer service manager, Case Optician, dispensing granted to share information::  Yes, Verbal Permission Granted  Name::      Jeanella Cara )  Agency::   (SNF's )  Relationship::   (Son)  Contact Information:   [phone])  Housing/Transportation Living arrangements for the past 2 months:  Single Family Home Source of Information:  Patient Patient Interpreter Needed:  None Criminal Activity/Legal Involvement Pertinent to Current Situation/Hospitalization:  No - Comment as needed Significant Relationships:  Adult Children, Other Family Members Lives with:  Self Do you feel safe going back to the place where you live?  No Need for family participation in patient care:  Yes (Comment)  Care giving concerns: Patient requiring short-term rehab within SNF.    Social Worker assessment / plan:  Holiday representative met with patient at bedside in reference to post-acute placement for SNF. CSW introduced CSW role and SNF process. CSW also reviewed and provided SNF list. Patient reported she is interested in placement at [facility] indicating she has worked there in the past. Patient further reported she is in worst share than she thought she would be. Patient acknowledged she would need assistance with ADL's etc before returning home alone. Patient she has good family support and gave CSW permission to contact son as needed. No further concerns reported at this time. CSW will continue to follow patient and pt's family for continued support and to facilitate pt's discharge needs once medically stable.   Employment status:  Retired Health visitor, Managed Care PT Recommendations:  [place] / Referral to community resources:  [place]  Patient/Family's Response to care: Patient alert and oriented x4. Patient agreeable to SNF placement and prefers [facility]. Pt indicated that family was involved in care and supportive of SNF placement. Pt pleasant and appreciated social work intervention.   Patient/Family's Understanding of and Emotional Response to Diagnosis, Current Treatment, and Prognosis:  Pt understanding of surgical procedure. CSW remains available as needed.   Emotional Assessment Appearance:  Appears stated age Attitude/Demeanor/Rapport:   (Pleasant ) Affect (typically observed):  Accepting, Appropriate, Pleasant Orientation:  Oriented to Self, Oriented to Place, Oriented to  Time, Oriented to Situation Alcohol / Substance use:  Not Applicable Psych involvement (Current and /or in the community):  No (Comment)  Discharge Needs  Concerns to be addressed:  Care Coordination, Home Safety Concerns Readmission within the last 30 days:  No Current discharge risk:  Dependent with Mobility Barriers to Discharge:  Continued Medical Work up   [employer], MSW, LCSWA [phone] 10/30/2014 10:46 AM

## 2014-10-30 NOTE — Care Management Important Message (Signed)
Important Message  Patient Details  Name: Latoya Stevenson MRN: [id number] Date of Birth: February 13, 1941   Medicare Important Message Given:  Yes-second notification given    Delorse Lek 10/30/2014, 3:52 PM

## 2014-10-30 NOTE — Clinical Social Work Placement (Signed)
   CLINICAL SOCIAL WORK PLACEMENT  NOTE  Date:  10/30/2014  Patient Details  Name: LULABELLE DESTA MRN: [id number] Date of Birth: 05-Nov-1940  Clinical Social Work is seeking post-discharge placement for this patient at the [place] level of care (*CSW will initial, date and re-position this form in  chart as items are completed):  Yes   Patient/family provided with [facility] Work Department's list of facilities offering this level of care within the geographic area requested by the patient (or if unable, by the patient's family).  Yes   Patient/family informed of their freedom to choose among providers that offer the needed level of care, that participate in Medicare, Medicaid or managed care program needed by the patient, have an available bed and are willing to accept the patient.  Yes   Patient/family informed of [facility]'s ownership interest in [facility] and [facility], as well as of the fact that they are under no obligation to receive care at these facilities.  PASRR submitted to EDS on 10/30/14     PASRR number received on 10/30/14     Existing PASRR number confirmed on       FL2 transmitted to all facilities in geographic area requested by pt/family on 10/30/14     FL2 transmitted to all facilities within larger geographic area on       Patient informed that his/her managed care company has contracts with or will negotiate with certain facilities, including the following:            Patient/family informed of bed offers received.  Patient chooses bed at       Physician recommends and patient chooses bed at      Patient to be transferred to   on  .  Patient to be transferred to facility by       Patient family notified on   of transfer.  Name of family member notified:        PHYSICIAN Please sign FL2     Additional Comment:    _______________________________________________ Glendon Axe, MSW, [place] [phone] 10/30/2014 10:55 AM

## 2014-10-31 ENCOUNTER — Encounter
Admission: RE | Admit: 2014-10-31 | Discharge: 2014-10-31 | Disposition: A | Discharge status: Home / Self Care | Payer: Medicare Other | Source: Ambulatory Visit | Attending: Internal Medicine | Admitting: Internal Medicine

## 2014-10-31 MED ORDER — BISACODYL 10 MG RE SUPP
10.0000 mg | Freq: Every day | RECTAL | Status: DC | PRN
  Administered 2014-10-31: 10 mg via RECTAL

## 2014-10-31 NOTE — Clinical Social Work Note (Signed)
Clinical Social Worker facilitated patient discharge including contacting patient family and facility to confirm patient discharge plans.  Clinical information faxed to facility and family agreeable with plan.  CSW arranged ambulance transport via [facility] to [facility].  RN to call report prior to discharge.  DC packet prepared and on chart for transport with number for report.   Clinical Social Worker will sign off for now as social work intervention is no longer needed. Please consult [place] again if new need arises.  Glendon Axe, MSW, LCSWA [phone] 10/31/2014 4:13 PM

## 2014-10-31 NOTE — Progress Notes (Signed)
Subjective: Patient reports "I just hurt at the incision when I turn or move. My hip and leg doesn't hurt anymore"  Objective: Vital signs in last 24 hours: Temp:  [97.4 F (36.3 C)-99.2 F (37.3 C)] 98.2 F (36.8 C) (08/16 0846) Pulse Rate:  [52-97] 52 (08/16 0846) Resp:  [18-20] 18 (08/16 0846) BP: (120-138)/(56-80) 138/70 mmHg (08/16 0846) SpO2:  [95 %-100 %] 95 % (08/16 0846)  Intake/Output from previous day:   Intake/Output this shift:    Alert, conversant. Incision with honeycomb drsg over Dermabond. No erythema, swelling, or drainage. Good strength BLE. Pain well controlled. No BM yet. Pt states she will request a suppository this morning.   Lab Results:  Recent Labs  10/28/14 1920  WBC 9.7  HGB 11.4*  HCT 35.3*  PLT 133*   BMET No results for input(s): NA, K, CL, CO2, GLUCOSE, BUN, CREATININE, CALCIUM in the last 72 hours.  Studies/Results: No results found.  Assessment/Plan: Improving   LOS: 4 days  Work on bowels this morning, with plan for SNF later today or tomorrow.  FL2 & Rx's for Dilaudid & Robaxin tubed to floor from OR.    Verdis Prime 10/31/2014, 8:52 AM

## 2014-10-31 NOTE — Progress Notes (Signed)
Physical Therapy Treatment Patient Details Name: Latoya Stevenson MRN: [id number] DOB: Nov 07, 1940 Today's Date: 10/31/2014    History of Present Illness 74 y.o. s/p L3-4 L4-5 Redo Laminectomy with Fusion    PT Comments    Pt is making progress towards PT goals. Pt showed good ambulation tolerence today with RW, requiring cues to for proper use of the RW and two standing static rest breaks for energy conservation. Pt was able to recall 3/3 back precautions. Continue to recommend DC destination to further improve on pt functional mobility.  Follow Up Recommendations  SNF;Supervision/Assistance - 24 hour     Equipment Recommendations  None recommended by PT    Recommendations for Other Services       Precautions / Restrictions Precautions Precautions: Back;Fall Precaution Comments: Reviewed back precautions Required Braces or Orthoses: Spinal Brace Spinal Brace: Lumbar corset;Applied in sitting position Restrictions Weight Bearing Restrictions: No    Mobility  Bed Mobility Overal bed mobility: Needs Assistance Bed Mobility: Supine to Sit     Supine to sit: Min assist     General bed mobility comments: pt in chair upon entering the room  Transfers Overall transfer level: Needs assistance Equipment used: Rolling walker (2 wheeled) Transfers: Sit to/from Stand Sit to Stand: Min assist         General transfer comment: vc for hand placement on chair, increased time to stand, min A for safety  Ambulation/Gait Ambulation/Gait assistance: Min guard Ambulation Distance (Feet): 200 Feet Assistive device: Rolling walker (2 wheeled) Gait Pattern/deviations: Step-through pattern;Decreased stride length;Narrow base of support;Antalgic Gait velocity: decreased Gait velocity interpretation: Below normal speed for age/gender General Gait Details: vc to stay within close proximity of the walk, cues to have pt look foward and not down when ambulating   Stairs             Wheelchair Mobility    Modified Rankin (Stroke Patients Only)       Balance Overall balance assessment: Needs assistance Sitting-balance support: No upper extremity supported;Feet supported Sitting balance-Leahy Scale: Good     Standing balance support: Bilateral upper extremity supported;During functional activity Standing balance-Leahy Scale: Fair Standing balance comment: pt is able to stand staticly with no AD however with any type of  dynamic movements patient becomes more unsteady                    Cognition Arousal/Alertness: Awake/alert Behavior During Therapy: WFL for tasks assessed/performed Overall Cognitive Status: Within Functional Limits for tasks assessed                      Exercises      General Comments General comments (skin integrity, edema, etc.): pt family member was present in the room during session      Pertinent Vitals/Pain Pain Assessment: 0-10 Pain Score: 6  Pain Location: surgical site in posterior back Pain Descriptors / Indicators: Aching;Sore Pain Intervention(s): Monitored during session    Home Living                      Prior Function            PT Goals (current goals can now be found in the care plan section) Acute Rehab PT Goals Patient Stated Goal: none stated Progress towards PT goals: Progressing toward goals    Frequency  Min 5X/week    PT Plan Current plan remains appropriate    Co-evaluation  End of Session Equipment Utilized During Treatment: Back brace Activity Tolerance: Patient tolerated treatment well Patient left: in chair;with call bell/phone within reach;with chair alarm set;with family/visitor present     Time: 1150-1205 PT Time Calculation (min) (ACUTE ONLY): 15 min  Charges:                       G Codes:      Latoya Stevenson 11/24/14, 2:27 PM   Latoya Stevenson (student physical therapy assistant) Acute Rehab [phone]

## 2014-10-31 NOTE — Progress Notes (Signed)
Occupational Therapy Treatment Patient Details Name: Latoya Stevenson MRN: [id number] DOB: 1940-03-28 Today's Date: 10/31/2014    History of present illness 74 y.o. s/p L3-4 L4-5 Redo Laminectomy with Fusion   OT comments  Pt completed adl at sink level and cues for back precautions. Pt able to don brace but required (A) to recall precautions for safety. Rn called to room to reinforce wound dressing.    Follow Up Recommendations  SNF    Equipment Recommendations  Other (comment)    Recommendations for Other Services      Precautions / Restrictions Precautions Precautions: Back;Fall Precaution Comments: Reviewed back precautions Required Braces or Orthoses: Spinal Brace Spinal Brace: Lumbar corset;Applied in sitting position       Mobility Bed Mobility Overal bed mobility: Needs Assistance Bed Mobility: Supine to Sit     Supine to sit: Min assist     General bed mobility comments: cues for sequence  Transfers Overall transfer level: Needs assistance Equipment used: Rolling walker (2 wheeled) Transfers: Sit to/from Stand Sit to Stand: Min guard         General transfer comment: cues for safety with RW    Balance Overall balance assessment: Needs assistance         Standing balance support: Single extremity supported;During functional activity Standing balance-Leahy Scale: Fair Standing balance comment: pt placing single ue on surface for support                   ADL Overall ADL's : Needs assistance/impaired     Grooming: Wash/dry face;Oral care;Applying deodorant;Brushing hair;Min guard;Standing Grooming Details (indicate cue type and reason): cues for back safety Upper Body Bathing: Min guard;Standing   Lower Body Bathing: Min guard;Sit to/from stand   Upper Body Dressing : Min guard;Standing Upper Body Dressing Details (indicate cue type and reason): able to don brace and doff brace mod I     Toilet Transfer: Minimal  assistance;Ambulation;RW           Functional mobility during ADLs: Min guard;Rolling walker General ADL Comments: pt needed encouraged to push RW and not pick up. Pt needs reinforcement of precautions.pt able to verbalize 2 out 3 ( fogetting arching). Pt required (A) to don bandaides with faux nail on thumbs due to nail damage      Vision                     Perception     Praxis      Cognition   Behavior During Therapy: [facility] for tasks assessed/performed Overall Cognitive Status: Within Functional Limits for tasks assessed                       Extremity/Trunk Assessment               Exercises     Shoulder Instructions       General Comments      Pertinent Vitals/ Pain          Home Living                                          Prior Functioning/Environment              Frequency Min 2X/week     Progress Toward Goals  OT Goals(current goals can now be found in the care plan section)  Progress towards OT goals: Progressing toward goals  Acute Rehab OT Goals Patient Stated Goal: Get therapy before I go home OT Goal Formulation: With patient Time For Goal Achievement: 11/04/14 Potential to Achieve Goals: Good ADL Goals Pt Will Perform Grooming: with set-up;standing;with supervision Pt Will Perform Lower Body Dressing: with set-up;with supervision;with adaptive equipment;sit to/from stand Pt Will Transfer to Toilet: with supervision;ambulating Pt Will Perform Toileting - Clothing Manipulation and hygiene: with supervision;sit to/from stand;with set-up Additional ADL Goal #1: Pt will independently verbalize 3/3 back precautions and maintain during session.  Plan Discharge plan remains appropriate    Co-evaluation                 End of Session Equipment Utilized During Treatment: Gait belt;Rolling walker;Back brace   Activity Tolerance Patient tolerated treatment well   Patient Left in chair;with  call bell/phone within reach;with chair alarm set;with nursing/sitter in room   Nurse Communication Mobility status;Precautions        Time: 2979-8921 OT Time Calculation (min): 47 min  Charges: OT General Charges $OT Visit: 1 Procedure OT Treatments $Self Care/Home Management : 38-52 mins  Parke Poisson B 10/31/2014, 2:20 PM    Jeri Modena   OTR/L Pager: [phone] Office: [phone] .

## 2014-10-31 NOTE — Clinical Social Work Placement (Signed)
   CLINICAL SOCIAL WORK PLACEMENT  NOTE  Date:  10/31/2014  Patient Details  Name: DELTHA BERNALES MRN: [id number] Date of Birth: April 26, 1940  Clinical Social Work is seeking post-discharge placement for this patient at the [place] level of care (*CSW will initial, date and re-position this form in  chart as items are completed):  Yes   Patient/family provided with [place] Work Department's list of facilities offering this level of care within the geographic area requested by the patient (or if unable, by the patient's family).  Yes   Patient/family informed of their freedom to choose among providers that offer the needed level of care, that participate in Medicare, Medicaid or managed care program needed by the patient, have an available bed and are willing to accept the patient.  Yes   Patient/family informed of [facility]'s ownership interest in [facility] and [facility], as well as of the fact that they are under no obligation to receive care at these facilities.  PASRR submitted to EDS on 10/30/14     PASRR number received on 10/30/14     Existing PASRR number confirmed on       FL2 transmitted to all facilities in geographic area requested by pt/family on 10/30/14     FL2 transmitted to all facilities within larger geographic area on       Patient informed that his/her managed care company has contracts with or will negotiate with certain facilities, including the following:        Yes   Patient/family informed of bed offers received.  Patient chooses bed at  [facility] )     Physician recommends and patient chooses bed at      Patient to be transferred to  [facility] ) on 10/31/14.  Patient to be transferred to facility by  [facility])     Patient family notified on 10/31/14 of transfer.  Name of family member notified:   (Pt's son)     PHYSICIAN Please sign FL2     Additional Comment:     _______________________________________________ Glendon Axe, MSW, LCSWA [phone] 10/31/2014 4:12 PM

## 2014-10-31 NOTE — Discharge Summary (Signed)
Physician Discharge Summary  Patient ID: Latoya Stevenson MRN: [id number] DOB/AGE: 73/29/1942 73 y.o.  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 Following uncomplicated redo decompression with L3-4, L4-5 fusion, she recovered well and transferred to the floor for nursing care and therapies.  She progressed slowly initially d/t pain control and hypotension, but normalized quickly.  Her leg pain has resolved and she is mobilizing better.  Consults: None  Significant Diagnostic Studies: radiology: X-Ray: intra-operative  Treatments: surgery: L3-4 L4-5 Redo Laminectomy with Fusion (N/A) - L3-4 L4-5 Redo Laminectomy with Fusion with pedicle screw fixation with posterolateral arthrodesis with Autograft, allograft, BMP     Discharge Exam: Blood pressure 140/83, pulse 88, temperature 98 F (36.7 C), temperature source Axillary, resp. rate 20, height 5' 3.5" (1.613 m), weight 78.926 kg (174 lb), SpO2 95 %. Alert, conversant. Incision with honeycomb drsg over Dermabond. No erythema, swelling, or drainage. Good strength BLE. Pain well controlled   Disposition: Discharge to SNF. [facility] in [place]. Rx's for Dilaudid and Robaxin to chart.  Pt may remove Honeycomb drsg at SNF and may shower. Dermabond will wash off in 2-3 weeks. Continue LSO when OOB x8weeks. F/U appt with DrStern in office in  3-4 weeks.      Medication List    STOP taking these medications        ciprofloxacin 250 MG tablet  Commonly known as:  CIPRO     nabumetone 500 MG tablet  Commonly known as:  RELAFEN     OSTEO BI-FLEX REGULAR STRENGTH PO      TAKE these medications        acetaminophen 325 MG tablet  Commonly known as:  TYLENOL  Take 650 mg by mouth every 6 (six) hours as needed. For pain     ALPRAZolam 0.25 MG tablet  Commonly known as:  XANAX  Take 0.25 mg by mouth at bedtime as needed for anxiety.     amLODipine 5 MG tablet  Commonly known as:  NORVASC  Take 5 mg by mouth daily.     atorvastatin 40 MG tablet  Commonly known as:  LIPITOR  Take 40 mg by mouth daily.     CALCIUM 1200 PO  Take 1 tablet by mouth daily.     diphenhydrAMINE 25 MG tablet  Commonly known as:  SOMINEX  Take 50 mg by mouth at bedtime as needed. For sleep     DOXYLAMINE SUCCINATE PO  Take 25 mg by mouth at bedtime.     DULoxetine 60 MG capsule  Commonly known as:  CYMBALTA  Take 60 mg by mouth daily.     fexofenadine 180 MG tablet  Commonly known as:  ALLEGRA  Take 180 mg by mouth daily.     fluticasone 50 MCG/ACT nasal spray  Commonly known as:  FLONASE  Place 2 sprays into the nose every evening.     irbesartan 300 MG tablet  Commonly known as:  AVAPRO  Take 300 mg by  mouth at bedtime.     levothyroxine 150 MCG tablet  Commonly known as:  SYNTHROID, LEVOTHROID  Take 150 mcg by mouth daily before breakfast.     levothyroxine 137 MCG tablet  Commonly known as:  SYNTHROID, LEVOTHROID  Take 137 mcg by mouth daily before breakfast.     meclizine 12.5 MG tablet  Commonly known as:  ANTIVERT  Take 12.5 mg by mouth 3 (three) times daily as needed for dizziness.     methocarbamol 500 MG tablet  Commonly known as:  ROBAXIN  Take 500 mg by mouth as needed.     multivitamin with minerals Tabs tablet  Take 1 tablet by mouth daily.     mupirocin cream 2 %  Commonly known as:  BACTROBAN   Apply 1 application topically daily.     omeprazole 20 MG capsule  Commonly known as:  PRILOSEC  Take 20 mg by mouth daily.     oxyCODONE-acetaminophen 5-325 MG per tablet  Commonly known as:  PERCOCET/ROXICET  Take 1 tablet by mouth every 8 (eight) hours as needed for severe pain.     Vitamin D-3 5000 UNITS Tabs  Take 5,000 Units by mouth daily.     vitamin E 400 UNIT capsule  Take 400 Units by mouth daily.         Signed: Verdis Prime 10/31/2014, 3:44 PM

## 2015-01-01 ENCOUNTER — Other Ambulatory Visit: Payer: Self-pay | Admitting: Internal Medicine

## 2015-01-01 DIAGNOSIS — Z1231 Encounter for screening mammogram for malignant neoplasm of breast: Secondary | ICD-10-CM

## 2015-01-03 ENCOUNTER — Ambulatory Visit: Payer: Medicare Other | Attending: Neurological Surgery | Admitting: Physical Therapy

## 2015-01-03 DIAGNOSIS — G8929 Other chronic pain: Secondary | ICD-10-CM | POA: Insufficient documentation

## 2015-01-03 DIAGNOSIS — R269 Unspecified abnormalities of gait and mobility: Secondary | ICD-10-CM | POA: Insufficient documentation

## 2015-01-03 DIAGNOSIS — M545 Low back pain: Secondary | ICD-10-CM | POA: Insufficient documentation

## 2015-01-03 DIAGNOSIS — R531 Weakness: Secondary | ICD-10-CM | POA: Insufficient documentation

## 2015-01-04 ENCOUNTER — Encounter: Payer: Self-pay | Admitting: Physical Therapy

## 2015-01-04 NOTE — Therapy (Signed)
[facility] [address]. [place], [place], [address] Phone: [phone]   Fax:  [phone]  Physical Therapy Evaluation  Patient Details  Name: Latoya Stevenson MRN: [id number] Date of Birth: Oct 13, 1940 Referring Provider: Nyoka Lint  Encounter Date: 01/03/2015      PT End of Session - 01/04/15 0935    Visit Number 1   Number of Visits 6   Date for PT Re-Evaluation 01/24/15   Authorization - Visit Number 1   Authorization - Number of Visits 10   PT Start Time 4034   PT Stop Time 1400   PT Time Calculation (min) 45 min   Activity Tolerance Patient tolerated treatment well   Behavior During Therapy [facility] for tasks assessed/performed      Past Medical History  Diagnosis Date  . Hypertension   . IBS (irritable bowel syndrome)   . Ulcer   . Colon polyp   . Arthritis   . Hyperlipidemia   . Bronchitis   . Wears dentures   . GERD (gastroesophageal reflux disease)   . Hypothyroidism   . Cancer [facility])     thyroid cancer  . Mold exposure 2002    hx of   . Environmental allergies   . Pneumonia 1978  . Headache     migraines as a young woman  . Scoliosis   . Anemia     low iron    Past Surgical History  Procedure Laterality Date  . Abdominal hysterectomy  1977  . Broken arm  1994    wrist fracture surgery, left  . Colonoscopy  2010  . Upper and lower gi  2010  . Transthoracic echocardiogram  2010  . Cardiovascular stress test      2010  . Total thyroidectomy    . Lumbar laminectomy/decompression microdiscectomy  11/18/2011    Procedure: LUMBAR LAMINECTOMY/DECOMPRESSION MICRODISCECTOMY 2 LEVELS;  Surgeon: Erline Levine, MD;  Location: [place] [facility];  Service: Neurosurgery;  Laterality: N/A;  Lumbar Three-Four,  Lumbar Four-Five Decompressive Laminectomy  . Eye surgery Bilateral     cataract surgery with lens implant  . Appendectomy    . Ovarian cyst surgery      There were no vitals filed for this visit.  Visit  Diagnosis:  Chronic midline low back pain without sciatica  Generalized weakness  Abnormality of gait      Subjective Assessment - 01/04/15 0930    Subjective Pt reports soreness at her site of incision but no pain. Pt arrives to PT tx session with her back brace donned and reports she is to wear it all the time when not in bed until 01/22/15 when she follows up with MD. Pt reports this as her second lumbar surgery. Pt reports lifting restriction of 10#.   Limitations Sitting;Walking;Standing;House hold activities   How long can you sit comfortably? 15-20 mins   How long can you stand comfortably? 10 mins   How long can you walk comfortably? 10 mins   Patient Stated Goals strengthen legs/return to gardening and wood working   Currently in Pain? No/denies            [facility] PT Assessment - 01/04/15 0001    Assessment   Medical Diagnosis M48.06   Referring Provider Nyoka Lint   Onset Date/Surgical Date 10/27/14   Next MD Visit 01/22/15      OBJECTIVE: Back brace left on for all exercises. There ex: supine TrA contraction: bicycle, bridging, marching, straight leg raise  x 5 each. Standing mini squats with B UE support 15 x 2 each. Standing hip 4 way x 20 each direction. Nu step L6 5 mins to end the treatment session.         PT Education - 01/04/15 0933    Education provided Yes   Education Details Pt educated on the importance of staying active and moving at home. Pt given new exercises and reviewed HEP from HHPT.    Person(s) Educated Patient   Methods Explanation;Demonstration;Handout   Comprehension Verbalized understanding;Returned demonstration             PT Long Term Goals - 01/04/15 1045    PT LONG TERM GOAL #1   Title Pt will complete ODI to determine self-perceived limitiations with regards to functional mobility.    Time 3   Period Weeks   Status New   PT LONG TERM GOAL #2   Title Pt will be independent with HEP in order to increase LE strength 1/2  MMT grade in order to return to woodworking.   Time 3   Period Weeks   Status New   PT LONG TERM GOAL #3   Title Pt will demonstrate proper lifting and squatting techniques for 10 repetitions in order to return to gardening.    Time 3   Period Weeks   Status New   PT LONG TERM GOAL #4   Title Pt will transition to independent walking program or Silver Sneakers to promote continued wellness in order to improve core stability.   Time 3   Period Weeks   Status New             Plan - 01/04/15 0935    Clinical Impression Statement Pt is a 74 y.o. F referred to PT following Lumbar laminetomy and fusion DOS: 10/27/14. Pt reports no pain but soreness at her incision site. Pt is dependent on back brace to maintain her posture and for support. Pt performs all standing exercises looking down at her feet and with a rounded kyhpotic posture. Pt is able to correct with verbal cues but unable to maintain correction. Pt ambulates with decreased arm swing bilaterally. Pt MMT is grossly 4/5 on the R and 4+/5 on the L. Pt is able to contract her TrA and perform a bridge but has difficulty maintaining a TrA contraction and performing bicycle. Pt will benefit from short term skilled PT in order to address functional deficits and regain strength in order to return to prior level of function.   Pt will benefit from skilled therapeutic intervention in order to improve on the following deficits Abnormal gait;Decreased activity tolerance;Decreased endurance;Decreased mobility;Decreased strength;Postural dysfunction;Improper body mechanics;Impaired flexibility;Difficulty walking;Decreased range of motion   Rehab Potential Good   PT Frequency 2x / week   PT Duration 3 weeks   PT Treatment/Interventions ADLs/Self Care Home Management;Moist Heat;Cryotherapy;Electrical Stimulation;Functional mobility training;Therapeutic activities;Therapeutic exercise;Balance training;Stair training;Gait training;Neuromuscular  re-education;Manual techniques;Patient/family education;Passive range of motion   PT Next Visit Plan more LE strengthening/gait endurance/education on why Nustep is not for strengthening.    PT Home Exercise Plan see handout above,   Recommended Other Services Silver Sneakers   Consulted and Agree with Plan of Care Patient         Problem List Patient Active Problem List   Diagnosis Date Noted  . Lumbar scoliosis 10/27/2014    Lavone Neri, SPT 01/04/2015, 10:53 AM  [facility] [address] [place], [place], [address] Phone:  [phone]   Fax:  [phone]  Name: Latoya Stevenson MRN: [id number] Date of Birth: 1940/12/03

## 2015-01-05 DIAGNOSIS — E039 Hypothyroidism, unspecified: Secondary | ICD-10-CM | POA: Insufficient documentation

## 2015-01-09 ENCOUNTER — Ambulatory Visit: Payer: Medicare Other | Admitting: Physical Therapy

## 2015-01-09 DIAGNOSIS — G8929 Other chronic pain: Secondary | ICD-10-CM

## 2015-01-09 DIAGNOSIS — R531 Weakness: Secondary | ICD-10-CM

## 2015-01-09 DIAGNOSIS — R269 Unspecified abnormalities of gait and mobility: Secondary | ICD-10-CM

## 2015-01-09 DIAGNOSIS — M545 Low back pain: Secondary | ICD-10-CM | POA: Diagnosis not present

## 2015-01-09 NOTE — Therapy (Addendum)
[facility] [address] [place], [place], [address] Phone: [phone]   Fax:  [phone]  Physical Therapy Treatment  Patient Details  Name: Latoya Stevenson MRN: [id number] Date of Birth: [birth year] Referring Provider: Nyoka Lint  Encounter Date: 01/09/2015      PT End of Session - 01/09/15 1514    Visit Number 2   Number of Visits 6   Date for PT Re-Evaluation 01/24/15   Authorization - Visit Number 2   Authorization - Number of Visits 10   PT Start Time 0859   PT Stop Time 0935   PT Time Calculation (min) 36 min   Activity Tolerance Patient tolerated treatment well   Behavior During Therapy [facility] for tasks assessed/performed      Past Medical History  Diagnosis Date  . Hypertension   . IBS (irritable bowel syndrome)   . Ulcer   . Colon polyp   . Arthritis   . Hyperlipidemia   . Bronchitis   . Wears dentures   . GERD (gastroesophageal reflux disease)   . Hypothyroidism   . Cancer [facility])     thyroid cancer  . Mold exposure 2002    hx of   . Environmental allergies   . Pneumonia 1978  . Headache     migraines as a young woman  . Scoliosis   . Anemia     low iron    Past Surgical History  Procedure Laterality Date  . Abdominal hysterectomy  1977  . Broken arm  1994    wrist fracture surgery, left  . Colonoscopy  2010  . Upper and lower gi  2010  . Transthoracic echocardiogram  2010  . Cardiovascular stress test      2010  . Total thyroidectomy    . Lumbar laminectomy/decompression microdiscectomy  11/18/2011    Procedure: LUMBAR LAMINECTOMY/DECOMPRESSION MICRODISCECTOMY 2 LEVELS;  Surgeon: Erline Levine, MD;  Location: [place] [facility]S;  Service: Neurosurgery;  Laterality: N/A;  Lumbar Three-Four,  Lumbar Four-Five Decompressive Laminectomy  . Eye surgery Bilateral     cataract surgery with lens implant  . Appendectomy    . Ovarian cyst surgery      There were no vitals filed for this visit.  Visit  Diagnosis:  Chronic midline low back pain without sciatica  Generalized weakness  Abnormality of gait      Subjective Assessment - 01/09/15 1513    Subjective Pt reports no pain but overall body soreness upon arrival. Pt forgot her brace at home today. Pt states she performed lots of yardwork yesterday and plans to finish raking leaves today.    Limitations Sitting;Walking;Standing;House hold activities   How long can you sit comfortably? 15-20 mins   How long can you stand comfortably? 10 mins   How long can you walk comfortably? 10 mins   Patient Stated Goals strengthen legs/return to gardening and wood working   Currently in Pain? No/denies         OBJECTIVE: There ex: Standing in // bars: marching/braiding/side stepping x 6 with B UE support. Attempted heel and toe walking but pt unable to maintain proper positioning while stepping even with cuing. Resisted gait all 4 planes with 2 BTB x 10 each. Step ups forwards and sideways x 20 on 6" step with B UE support. Supine: bridging x 20/marching with TrA control x10 with maximal verbal cuing. Cool down: 5 mins on Nustep L7.   Pt response to Tx for medical necessity:  Pt continues to benefit from core stability and encouragement to maintain active lifestyle. Pt requires constant cues for proper technique and progression of exercise.       PT Long Term Goals - 01/04/15 1045    PT LONG TERM GOAL #1   Title Pt will complete ODI to determine self-perceived limitiations with regards to functional mobility.    Time 3   Period Weeks   Status New   PT LONG TERM GOAL #2   Title Pt will be independent with HEP in order to increase LE strength 1/2 MMT grade in order to return to woodworking.   Time 3   Period Weeks   Status New   PT LONG TERM GOAL #3   Title Pt will demonstrate proper lifting and squatting techniques for 10 repetitions in order to return to gardening.    Time 3   Period Weeks   Status New   PT LONG TERM GOAL #4    Title Pt will transition to independent walking program or Silver Sneakers to promote continued wellness in order to improve core stability.   Time 3   Period Weeks   Status New            Plan - 01/09/15 1514    Clinical Impression Statement Pt is limited in acitivity by overall deconditioning and faituge with exercise. Pt is able to perform 20 reps of standing exercises with multiple standing rest breaks. Pt requires consistent verbal cuing for proper technique and pacing of exercise. Pt is able to maintain a TrA contraction with all activities but must re-set her contraction often.    Pt will benefit from skilled therapeutic intervention in order to improve on the following deficits Abnormal gait;Decreased activity tolerance;Decreased endurance;Decreased mobility;Decreased strength;Postural dysfunction;Improper body mechanics;Impaired flexibility;Difficulty walking;Decreased range of motion   Rehab Potential Good   PT Frequency 2x / week   PT Duration 3 weeks   PT Treatment/Interventions ADLs/Self Care Home Management;Moist Heat;Cryotherapy;Electrical Stimulation;Functional mobility training;Therapeutic activities;Therapeutic exercise;Balance training;Stair training;Gait training;Neuromuscular re-education;Manual techniques;Patient/family education;Passive range of motion   PT Next Visit Plan more LE strengthening/gait endurance/education on why Nustep is not for strengthening.    PT Home Exercise Plan see handout above,   Consulted and Agree with Plan of Care Patient        Problem List Patient Active Problem List   Diagnosis Date Noted  . Lumbar scoliosis 10/27/2014   Pura Spice, PT, DPT # [id number]   01/10/2015, 9:42 AM  [facility] [address] [place], [place], [address] Phone: [phone]   Fax:  [phone]  Name: Latoya Stevenson MRN: [id number] Date of Birth: 1941-03-14

## 2015-01-10 ENCOUNTER — Other Ambulatory Visit: Payer: Self-pay | Admitting: Internal Medicine

## 2015-01-10 ENCOUNTER — Ambulatory Visit
Admission: RE | Admit: 2015-01-10 | Discharge: 2015-01-10 | Disposition: A | Discharge status: Home / Self Care | Payer: Medicare Other | Source: Ambulatory Visit | Attending: Internal Medicine | Admitting: Internal Medicine

## 2015-01-10 DIAGNOSIS — Z1231 Encounter for screening mammogram for malignant neoplasm of breast: Secondary | ICD-10-CM | POA: Insufficient documentation

## 2015-01-11 ENCOUNTER — Ambulatory Visit: Payer: Medicare Other | Admitting: Physical Therapy

## 2015-01-11 ENCOUNTER — Encounter: Payer: Self-pay | Admitting: Physical Therapy

## 2015-01-11 DIAGNOSIS — R531 Weakness: Secondary | ICD-10-CM

## 2015-01-11 DIAGNOSIS — G8929 Other chronic pain: Secondary | ICD-10-CM

## 2015-01-11 DIAGNOSIS — M545 Low back pain: Secondary | ICD-10-CM | POA: Diagnosis not present

## 2015-01-11 DIAGNOSIS — R269 Unspecified abnormalities of gait and mobility: Secondary | ICD-10-CM

## 2015-01-11 NOTE — Therapy (Signed)
[facility] [address] [place], [place], [address] Phone: [phone]   Fax:  [phone]  Physical Therapy Treatment  Patient Details  Name: TARRI GUILFOIL MRN: [id number] Date of Birth: 28-Sep-1940 Referring Provider: Nyoka Lint  Encounter Date: 01/11/2015      PT End of Session - 01/11/15 1754    Visit Number 3   Number of Visits 6   Date for PT Re-Evaluation 01/24/15   Authorization - Visit Number 3   Authorization - Number of Visits 10   PT Start Time 0938   PT Stop Time 1829   PT Time Calculation (min) 54 min   Activity Tolerance Patient tolerated treatment well;No increased pain   Behavior During Therapy [facility] for tasks assessed/performed      Past Medical History  Diagnosis Date  . Hypertension   . IBS (irritable bowel syndrome)   . Ulcer   . Colon polyp   . Arthritis   . Hyperlipidemia   . Bronchitis   . Wears dentures   . GERD (gastroesophageal reflux disease)   . Hypothyroidism   . Cancer [facility])     thyroid cancer  . Mold exposure 2002    hx of   . Environmental allergies   . Pneumonia 1978  . Headache     migraines as a young woman  . Scoliosis   . Anemia     low iron    Past Surgical History  Procedure Laterality Date  . Abdominal hysterectomy  1977  . Broken arm  1994    wrist fracture surgery, left  . Colonoscopy  2010  . Upper and lower gi  2010  . Transthoracic echocardiogram  2010  . Cardiovascular stress test      2010  . Total thyroidectomy    . Lumbar laminectomy/decompression microdiscectomy  11/18/2011    Procedure: LUMBAR LAMINECTOMY/DECOMPRESSION MICRODISCECTOMY 2 LEVELS;  Surgeon: Erline Levine, MD;  Location: [place] [facility];  Service: Neurosurgery;  Laterality: N/A;  Lumbar Three-Four,  Lumbar Four-Five Decompressive Laminectomy  . Eye surgery Bilateral     cataract surgery with lens implant  . Appendectomy    . Ovarian cyst surgery    . Breast cyst aspiration  1970's     There were no vitals filed for this visit.  Visit Diagnosis:  Chronic midline low back pain without sciatica  Generalized weakness  Abnormality of gait      Subjective Assessment - 01/11/15 1751    Subjective Pt reports soreness in her back from leaf-blowing yesterday. Pt states she had a catching pain in her R hip and back for a little while last night but it went away.    Limitations Sitting;Walking;Standing;House hold activities   How long can you sit comfortably? 15-20 mins   How long can you stand comfortably? 10 mins   How long can you walk comfortably? 10 mins   Patient Stated Goals strengthen legs/return to gardening and wood working   Currently in Pain? No/denies      OBJECTIVE: There ex: In supine without back brace, TrA activation with 5 second holds x 10. Pt required consistent verbal cues for technique and proper amount of hold. Past 7 reps, pt had a challenge holding her TrA contraction and talking. TrA  Activation with: pelvic tilts/straight leg raise/marching/bridging x 20 each. In standing, resisted gait all 4 planes with 2 BTB x 10 each. Step ups onto 6" step with no UE support. 10 mins on Nustep L7 (  no charge, cool down). Manual therapy: In L sidelying, R piriformis STM and paraspinals. Supine stretching to B hamstrings with neural glides, piriformis and knees to chest PROM.   Pt response to Tx for medical necessity: Increased endurance noted with Nustep and yard work with strengthening exercises. Pt benefits from progressed core stability and verbal cuing for proper form and maintaining TrA activation with mobility.        PT Long Term Goals - 01/04/15 1045    PT LONG TERM GOAL #1   Title Pt will complete ODI to determine self-perceived limitiations with regards to functional mobility.    Time 3   Period Weeks   Status New   PT LONG TERM GOAL #2   Title Pt will be independent with HEP in order to increase LE strength 1/2 MMT grade in order to return to  woodworking.   Time 3   Period Weeks   Status New   PT LONG TERM GOAL #3   Title Pt will demonstrate proper lifting and squatting techniques for 10 repetitions in order to return to gardening.    Time 3   Period Weeks   Status New   PT LONG TERM GOAL #4   Title Pt will transition to independent walking program or Silver Sneakers to promote continued wellness in order to improve core stability.   Time 3   Period Weeks   Status New               Plan - 01/11/15 1756    Clinical Impression Statement Pt demonstrate good muscle endurance by being able to perform 20 reps of all core strengthening exercises. Pt requires constant verbal cues to maintain TrA contraction with movement and to perform movements slowly. Pt is able to maintain TrA contraction with standing strengthening on the Airex. Pt demonstrates appropriate balance strategies and no loss of balance on the Airex pad.    Pt will benefit from skilled therapeutic intervention in order to improve on the following deficits Abnormal gait;Decreased activity tolerance;Decreased endurance;Decreased mobility;Decreased strength;Postural dysfunction;Improper body mechanics;Impaired flexibility;Difficulty walking;Decreased range of motion   Rehab Potential Good   PT Frequency 2x / week   PT Duration 3 weeks   PT Treatment/Interventions ADLs/Self Care Home Management;Moist Heat;Cryotherapy;Electrical Stimulation;Functional mobility training;Therapeutic activities;Therapeutic exercise;Balance training;Stair training;Gait training;Neuromuscular re-education;Manual techniques;Patient/family education;Passive range of motion   PT Next Visit Plan continue core strengthening without brace/increase standing time/walking/woodworking task simulation   PT Home Exercise Plan continue current plan   Recommended Other Services aquatic   Consulted and Agree with Plan of Care Patient        Problem List Patient Active Problem List   Diagnosis Date  Noted  . Lumbar scoliosis 10/27/2014    Lavone Neri, SPT 01/11/2015, 9:40 PM  [place] [facility] [address] [place], [place], [address] Phone: [phone]   Fax:  [phone]  Name: KASHAWN MANZANO MRN: [id number] Date of Birth: 1941/01/07

## 2015-01-12 ENCOUNTER — Other Ambulatory Visit: Payer: Self-pay | Admitting: Internal Medicine

## 2015-01-12 DIAGNOSIS — R928 Other abnormal and inconclusive findings on diagnostic imaging of breast: Secondary | ICD-10-CM

## 2015-01-16 ENCOUNTER — Ambulatory Visit: Payer: Medicare Other | Attending: Neurological Surgery | Admitting: Physical Therapy

## 2015-01-16 ENCOUNTER — Encounter: Payer: Self-pay | Admitting: Physical Therapy

## 2015-01-16 DIAGNOSIS — G8929 Other chronic pain: Secondary | ICD-10-CM | POA: Insufficient documentation

## 2015-01-16 DIAGNOSIS — M545 Low back pain: Secondary | ICD-10-CM | POA: Insufficient documentation

## 2015-01-16 DIAGNOSIS — R531 Weakness: Secondary | ICD-10-CM | POA: Insufficient documentation

## 2015-01-16 DIAGNOSIS — R269 Unspecified abnormalities of gait and mobility: Secondary | ICD-10-CM | POA: Insufficient documentation

## 2015-01-16 NOTE — Therapy (Signed)
[place] [facility] [address] [place], [place], [address] Phone: [phone]   Fax:  [phone]  Physical Therapy Treatment  Patient Details  Name: Latoya Stevenson MRN: [id number] Date of Birth: 12/20/1940 Referring Provider: Nyoka Lint  Encounter Date: 01/16/2015      PT End of Session - 01/16/15 1711    Visit Number 4   Number of Visits 6   Date for PT Re-Evaluation 01/24/15   Authorization - Visit Number 4   Authorization - Number of Visits 10   PT Start Time 0950   PT Stop Time 5170   PT Time Calculation (min) 48 min   Activity Tolerance Patient tolerated treatment well;No increased pain   Behavior During Therapy [facility] for tasks assessed/performed      Past Medical History  Diagnosis Date  . Hypertension   . IBS (irritable bowel syndrome)   . Ulcer   . Colon polyp   . Arthritis   . Hyperlipidemia   . Bronchitis   . Wears dentures   . GERD (gastroesophageal reflux disease)   . Hypothyroidism   . Cancer [facility])     thyroid cancer  . Mold exposure 2002    hx of   . Environmental allergies   . Pneumonia 1978  . Headache     migraines as a young woman  . Scoliosis   . Anemia     low iron    Past Surgical History  Procedure Laterality Date  . Abdominal hysterectomy  1977  . Broken arm  1994    wrist fracture surgery, left  . Colonoscopy  2010  . Upper and lower gi  2010  . Transthoracic echocardiogram  2010  . Cardiovascular stress test      2010  . Total thyroidectomy    . Lumbar laminectomy/decompression microdiscectomy  11/18/2011    Procedure: LUMBAR LAMINECTOMY/DECOMPRESSION MICRODISCECTOMY 2 LEVELS;  Surgeon: Erline Levine, MD;  Location: [place] [facility];  Service: Neurosurgery;  Laterality: N/A;  Lumbar Three-Four,  Lumbar Four-Five Decompressive Laminectomy  . Eye surgery Bilateral     cataract surgery with lens implant  . Appendectomy    . Ovarian cyst surgery    . Breast cyst aspiration  1970's     There were no vitals filed for this visit.  Visit Diagnosis:  Chronic midline low back pain without sciatica  Generalized weakness  Abnormality of gait      Subjective Assessment - 01/16/15 1710    Subjective Pt reports muscle soreness after last PT tx session but did not carry over into the weekend. Pt states she has returned to woodworking without any complaints. Pt states her MD f/u is being changed but is unsure of date.    Limitations Sitting;Walking;Standing;House hold activities   How long can you sit comfortably? 15-20 mins   How long can you stand comfortably? 10 mins   How long can you walk comfortably? 10 mins   Patient Stated Goals strengthen legs/return to gardening and wood working   Currently in Pain? No/denies          OBJECTIVE: There ex: Nustep L7 10 mins (no charge). Supine reassessment of flexibility( pt continues to maintain good flexibility). Supine bridging and bicycle x 20 each (pt requires verbal cues for slow and controlled movements. Planks on elbows and toes 4 x 10 second holds. Planks on elbows and knees 6 x 30 second holds. Quadraped bird dog x 10 each leg. Pt requires consistent verbal cuing  to keep neutral pelvic alignment and to perform slow and controlled movements. Reviewed and discussed changes to HEP (see handouts).  Pt response to Tx for medical necessity: Pt benefits from core stability to activate TrA with all mobility and hobbies to decrease dependency on her back brace.            PT Education - 01/16/15 1711    Education provided Yes   Education Details Pt given core stability progression of plank on elbows and knees/bridging/and advanced bicycle and told to not perform SLR/marching and tilts.   Person(s) Educated Patient   Methods Explanation;Demonstration;Handout   Comprehension Returned demonstration;Verbalized understanding             PT Long Term Goals - 01/04/15 1045    PT LONG TERM GOAL #1   Title Pt will  complete ODI to determine self-perceived limitiations with regards to functional mobility.    Time 3   Period Weeks   Status New   PT LONG TERM GOAL #2   Title Pt will be independent with HEP in order to increase LE strength 1/2 MMT grade in order to return to woodworking.   Time 3   Period Weeks   Status New   PT LONG TERM GOAL #3   Title Pt will demonstrate proper lifting and squatting techniques for 10 repetitions in order to return to gardening.    Time 3   Period Weeks   Status New   PT LONG TERM GOAL #4   Title Pt will transition to independent walking program or Silver Sneakers to promote continued wellness in order to improve core stability.   Time 3   Period Weeks   Status New               Plan - 01/16/15 1712    Clinical Impression Statement Pt's core strength has improved since initial evaluation as noted by her progression to quadraped and prone planks on elbows. Pt is unsteady with quadraped and demonstrates increased trunk rotation but is able to correct with verbal cuing. Pt is able to maintain a plank on elbows and knees for 30 seconds and a plank on elbows and hands for 10 seconds. Pt has returned to all yardwork and woodworking. Will reassess next visit and discuss decreasing frequency to once a week to help transition to hobbies without use of back brace.   Pt will benefit from skilled therapeutic intervention in order to improve on the following deficits Abnormal gait;Decreased activity tolerance;Decreased endurance;Decreased mobility;Decreased strength;Postural dysfunction;Improper body mechanics;Impaired flexibility;Difficulty walking;Decreased range of motion   Rehab Potential Good   PT Frequency 2x / week   PT Duration 3 weeks   PT Treatment/Interventions ADLs/Self Care Home Management;Moist Heat;Cryotherapy;Electrical Stimulation;Functional mobility training;Therapeutic activities;Therapeutic exercise;Balance training;Stair training;Gait  training;Neuromuscular re-education;Manual techniques;Patient/family education;Passive range of motion   PT Next Visit Plan reassess quadraped/goals/discuss less use of back brace   PT Home Exercise Plan changed to core stability: bicycle/bridiging/planks on elbows   Consulted and Agree with Plan of Care Patient        Problem List Patient Active Problem List   Diagnosis Date Noted  . Lumbar scoliosis 10/27/2014   Pura Spice, PT, DPT # [id number]   01/16/2015, 5:30 PM   [facility] [address] [place], [place], [address] Phone: [phone]   Fax:  [phone]  Name: Latoya Stevenson MRN: [id number] Date of Birth: 1940/07/05

## 2015-01-18 ENCOUNTER — Ambulatory Visit: Payer: Medicare Other | Admitting: Physical Therapy

## 2015-01-18 ENCOUNTER — Encounter: Payer: Self-pay | Admitting: Physical Therapy

## 2015-01-18 DIAGNOSIS — R269 Unspecified abnormalities of gait and mobility: Secondary | ICD-10-CM

## 2015-01-18 DIAGNOSIS — M545 Low back pain: Principal | ICD-10-CM

## 2015-01-18 DIAGNOSIS — R531 Weakness: Secondary | ICD-10-CM

## 2015-01-18 DIAGNOSIS — G8929 Other chronic pain: Secondary | ICD-10-CM

## 2015-01-18 NOTE — Therapy (Signed)
[facility] [address] [place], [place], [address] Phone: [phone]   Fax:  [phone]  Physical Therapy Treatment  Patient Details  Name: SATIVA GELLES MRN: [id number] Date of Birth: 1940-09-21 Referring Provider: Nyoka Lint  Encounter Date: 01/18/2015      PT End of Session - 01/18/15 0951    Visit Number 5   Number of Visits 6   Date for PT Re-Evaluation 01/24/15   Authorization - Visit Number 5   Authorization - Number of Visits 10   PT Start Time [phone]   Activity Tolerance Patient tolerated treatment well;No increased pain   Behavior During Therapy [facility] for tasks assessed/performed      Past Medical History  Diagnosis Date  . Hypertension   . IBS (irritable bowel syndrome)   . Ulcer   . Colon polyp   . Arthritis   . Hyperlipidemia   . Bronchitis   . Wears dentures   . GERD (gastroesophageal reflux disease)   . Hypothyroidism   . Cancer [facility])     thyroid cancer  . Mold exposure 2002    hx of   . Environmental allergies   . Pneumonia 1978  . Headache     migraines as a young woman  . Scoliosis   . Anemia     low iron    Past Surgical History  Procedure Laterality Date  . Abdominal hysterectomy  1977  . Broken arm  1994    wrist fracture surgery, left  . Colonoscopy  2010  . Upper and lower gi  2010  . Transthoracic echocardiogram  2010  . Cardiovascular stress test      2010  . Total thyroidectomy    . Lumbar laminectomy/decompression microdiscectomy  11/18/2011    Procedure: LUMBAR LAMINECTOMY/DECOMPRESSION MICRODISCECTOMY 2 LEVELS;  Surgeon: Erline Levine, MD;  Location: [place] [facility];  Service: Neurosurgery;  Laterality: N/A;  Lumbar Three-Four,  Lumbar Four-Five Decompressive Laminectomy  . Eye surgery Bilateral     cataract surgery with lens implant  . Appendectomy    . Ovarian cyst surgery    . Breast cyst aspiration  1970's    There were no vitals filed for this visit.  Visit  Diagnosis:  Chronic midline low back pain without sciatica  Generalized weakness  Abnormality of gait      Subjective Assessment - 01/18/15 0950    Subjective Pt reports muscle soreness after last session in her R hip/piriformis muscle. Pt states that she hasn't done much since last session. Pt reports MD F/U is Wednesday Nov 9.    Limitations Sitting;Walking;Standing;House hold activities   How long can you sit comfortably? 15-20 mins   How long can you stand comfortably? 10 mins   How long can you walk comfortably? 10 mins   Patient Stated Goals strengthen legs/return to gardening and wood working   Currently in Pain? No/denies      OBJECTIVE:  ODI: 12%, self-perceived minimal disability.  MMT: B 4+/5 There ex: Nustep L8 10 mins (warmup, no charge). Floor to waist lift with 15# to simulate gardening and wood working tasks. Resisted gait all 4 planes x 10 each. Supine knee to chest x 30. Supine  bridging x20. Supine core exercises: Reviewed HEP in detail and discussed how to progress and encouraged Pt to seek community wellness with Silver Sneakers program. Manual: stretching to bilateral LE focusing on piriformis/hamstring/hip flexion. R sidelying STM to L piriformis/glut/hip ER musculature. Noted tenderness to palpation  with this. Increased sensitivity with ischemic compression. ITB tightness noted mid thigh. Ice to end session in R sidelying on L hip area to decrease inflammation.  Pt response to tx for medical necessity: Pt progressed well with TrA contraction and incorporating it into all functional mobility. Pt is I with HEP and encouraged to join Silver Sneakers for continued exercise.         PT Long Term Goals - 01/04/15 1045    PT LONG TERM GOAL #1   Title Pt will complete ODI to determine self-perceived limitiations with regards to functional mobility.    Time 3   Period Weeks   Status New   PT LONG TERM GOAL #2   Title Pt will be independent with HEP in order to  increase LE strength 1/2 MMT grade in order to return to woodworking.   Time 3   Period Weeks   Status New   PT LONG TERM GOAL #3   Title Pt will demonstrate proper lifting and squatting techniques for 10 repetitions in order to return to gardening.    Time 3   Period Weeks   Status New   PT LONG TERM GOAL #4   Title Pt will transition to independent walking program or Silver Sneakers to promote continued wellness in order to improve core stability.   Time 3   Period Weeks   Status New         Problem List Patient Active Problem List   Diagnosis Date Noted  . Lumbar scoliosis 10/27/2014   Pura Spice, PT, DPT # [phone]   01/18/2015, 9:54 AM  [place] [facility] [address] [place], [place], [address] Phone: [phone]   Fax:  [phone]  Name: IRIA JAMERSON MRN: [id number] Date of Birth: [birth year]

## 2015-01-29 ENCOUNTER — Ambulatory Visit
Admission: RE | Admit: 2015-01-29 | Discharge: 2015-01-29 | Disposition: A | Discharge status: Home / Self Care | Payer: Medicare Other | Source: Ambulatory Visit | Attending: Internal Medicine | Admitting: Internal Medicine

## 2015-01-29 DIAGNOSIS — R928 Other abnormal and inconclusive findings on diagnostic imaging of breast: Secondary | ICD-10-CM | POA: Diagnosis not present

## 2015-07-27 DIAGNOSIS — R42 Dizziness and giddiness: Secondary | ICD-10-CM | POA: Insufficient documentation

## 2015-07-27 DIAGNOSIS — K219 Gastro-esophageal reflux disease without esophagitis: Secondary | ICD-10-CM | POA: Insufficient documentation

## 2015-07-27 DIAGNOSIS — J301 Allergic rhinitis due to pollen: Secondary | ICD-10-CM | POA: Insufficient documentation

## 2015-07-27 DIAGNOSIS — M810 Age-related osteoporosis without current pathological fracture: Secondary | ICD-10-CM | POA: Insufficient documentation

## 2016-01-03 ENCOUNTER — Other Ambulatory Visit: Payer: Self-pay | Admitting: Internal Medicine

## 2016-01-03 DIAGNOSIS — Z1231 Encounter for screening mammogram for malignant neoplasm of breast: Secondary | ICD-10-CM

## 2016-01-11 ENCOUNTER — Ambulatory Visit
Admission: RE | Admit: 2016-01-11 | Discharge: 2016-01-11 | Disposition: A | Discharge status: Home / Self Care | Payer: Medicare Other | Source: Ambulatory Visit | Attending: Internal Medicine | Admitting: Internal Medicine

## 2016-01-11 DIAGNOSIS — Z1231 Encounter for screening mammogram for malignant neoplasm of breast: Secondary | ICD-10-CM | POA: Insufficient documentation

## 2016-07-28 DIAGNOSIS — M5441 Lumbago with sciatica, right side: Secondary | ICD-10-CM | POA: Insufficient documentation

## 2016-08-29 ENCOUNTER — Other Ambulatory Visit: Payer: Self-pay | Admitting: Neurosurgery

## 2016-09-08 ENCOUNTER — Encounter (HOSPITAL_COMMUNITY): Payer: Self-pay | Admitting: *Deleted

## 2016-09-08 NOTE — Progress Notes (Signed)
Spoke with pt for pre-op call. Pt denies cardiac history, chest pain or sob. Pt states she is not a diabetic.

## 2016-09-09 ENCOUNTER — Inpatient Hospital Stay (HOSPITAL_COMMUNITY): Payer: Medicare Other

## 2016-09-09 ENCOUNTER — Inpatient Hospital Stay (HOSPITAL_COMMUNITY): Payer: Medicare Other | Admitting: Certified Registered Nurse Anesthetist

## 2016-09-09 ENCOUNTER — Inpatient Hospital Stay (HOSPITAL_COMMUNITY)
Admission: RE | Admit: 2016-09-09 | Discharge: 2016-09-10 | DRG: 460 | Disposition: A | Discharge status: Home / Self Care | Payer: Medicare Other | Source: Ambulatory Visit | Attending: Neurosurgery | Admitting: Neurosurgery

## 2016-09-09 ENCOUNTER — Encounter (HOSPITAL_COMMUNITY)
Admission: RE | Disposition: A | Discharge status: Home / Self Care | Payer: Self-pay | Source: Ambulatory Visit | Attending: Neurosurgery

## 2016-09-09 ENCOUNTER — Encounter (HOSPITAL_COMMUNITY): Payer: Self-pay | Admitting: General Practice

## 2016-09-09 DIAGNOSIS — W19XXXA Unspecified fall, initial encounter: Secondary | ICD-10-CM

## 2016-09-09 DIAGNOSIS — E039 Hypothyroidism, unspecified: Secondary | ICD-10-CM | POA: Diagnosis present

## 2016-09-09 DIAGNOSIS — K219 Gastro-esophageal reflux disease without esophagitis: Secondary | ICD-10-CM | POA: Diagnosis present

## 2016-09-09 DIAGNOSIS — M1990 Unspecified osteoarthritis, unspecified site: Secondary | ICD-10-CM | POA: Diagnosis present

## 2016-09-09 DIAGNOSIS — I10 Essential (primary) hypertension: Secondary | ICD-10-CM | POA: Diagnosis present

## 2016-09-09 DIAGNOSIS — M4316 Spondylolisthesis, lumbar region: Secondary | ICD-10-CM | POA: Diagnosis present

## 2016-09-09 DIAGNOSIS — M48062 Spinal stenosis, lumbar region with neurogenic claudication: Principal | ICD-10-CM | POA: Diagnosis present

## 2016-09-09 DIAGNOSIS — Z79899 Other long term (current) drug therapy: Secondary | ICD-10-CM | POA: Diagnosis not present

## 2016-09-09 DIAGNOSIS — M4126 Other idiopathic scoliosis, lumbar region: Secondary | ICD-10-CM | POA: Diagnosis present

## 2016-09-09 DIAGNOSIS — M5416 Radiculopathy, lumbar region: Secondary | ICD-10-CM | POA: Diagnosis present

## 2016-09-09 DIAGNOSIS — M5440 Lumbago with sciatica, unspecified side: Secondary | ICD-10-CM | POA: Diagnosis present

## 2016-09-09 DIAGNOSIS — R0781 Pleurodynia: Secondary | ICD-10-CM

## 2016-09-09 DIAGNOSIS — Z419 Encounter for procedure for purposes other than remedying health state, unspecified: Secondary | ICD-10-CM

## 2016-09-09 HISTORY — PX: ANTERIOR LAT LUMBAR FUSION: SHX1168

## 2016-09-09 HISTORY — DX: Restless legs syndrome: G25.81

## 2016-09-09 LAB — CBC
HCT: 43.2 % (ref 36.0–46.0)
Hemoglobin: 14 g/dL (ref 12.0–15.0)
MCH: 28.6 pg (ref 26.0–34.0)
MCHC: 32.4 g/dL (ref 30.0–36.0)
MCV: 88.3 fL (ref 78.0–100.0)
Platelets: 150 10*3/uL (ref 150–400)
RBC: 4.89 MIL/uL (ref 3.87–5.11)
RDW: 14.2 % (ref 11.5–15.5)
WBC: 5.8 10*3/uL (ref 4.0–10.5)

## 2016-09-09 LAB — BASIC METABOLIC PANEL
Anion gap: 11 (ref 5–15)
BUN: 19 mg/dL (ref 6–20)
CO2: 20 mmol/L — ABNORMAL LOW (ref 22–32)
Calcium: 9.2 mg/dL (ref 8.9–10.3)
Chloride: 107 mmol/L (ref 101–111)
Creatinine, Ser: 1.26 mg/dL — ABNORMAL HIGH (ref 0.44–1.00)
GFR calc Af Amer: 47 mL/min — ABNORMAL LOW (ref >60)
GFR calc non Af Amer: 41 mL/min — ABNORMAL LOW (ref >60)
Glucose, Bld: 100 mg/dL — ABNORMAL HIGH (ref 65–99)
Potassium: 5 mmol/L (ref 3.5–5.1)
Sodium: 138 mmol/L (ref 135–145)

## 2016-09-09 LAB — TYPE AND SCREEN
ABO/RH(D): A POS
Antibody Screen: NEGATIVE

## 2016-09-09 LAB — SURGICAL PCR SCREEN
MRSA, PCR: NEGATIVE
Staphylococcus aureus: NEGATIVE

## 2016-09-09 SURGERY — ANTERIOR LATERAL LUMBAR FUSION 1 LEVEL
Anesthesia: General | Laterality: Left

## 2016-09-09 MED ORDER — AMLODIPINE BESYLATE 5 MG PO TABS
5.0000 mg | ORAL_TABLET | Freq: Every day | ORAL | Status: DC
  Administered 2016-09-09: 5 mg via ORAL
  Filled 2016-09-09: qty 1

## 2016-09-09 MED ORDER — THROMBIN 5000 UNITS EX SOLR
CUTANEOUS | Status: AC
  Filled 2016-09-09: qty 10000

## 2016-09-09 MED ORDER — SODIUM CHLORIDE 0.9% FLUSH: 3.0000 mL | INTRAVENOUS | Status: DC | PRN

## 2016-09-09 MED ORDER — FLEET ENEMA 7-19 GM/118ML RE ENEM: 1.0000 | ENEMA | Freq: Once | RECTAL | Status: DC | PRN

## 2016-09-09 MED ORDER — KCL IN DEXTROSE-NACL 20-5-0.45 MEQ/L-%-% IV SOLN
INTRAVENOUS | Status: DC
Start: 2016-09-09 — End: 2016-09-10

## 2016-09-09 MED ORDER — VITAMIN E 180 MG (400 UNIT) PO CAPS
400.0000 [IU] | ORAL_CAPSULE | Freq: Every day | ORAL | Status: DC
  Administered 2016-09-09 – 2016-09-10 (×2): 400 [IU] via ORAL
  Filled 2016-09-09 (×2): qty 1

## 2016-09-09 MED ORDER — LACTATED RINGERS IV SOLN
INTRAVENOUS | Status: DC
  Administered 2016-09-09 (×2): via INTRAVENOUS

## 2016-09-09 MED ORDER — BUPIVACAINE HCL (PF) 0.5 % IJ SOLN
INTRAMUSCULAR | Status: AC
  Filled 2016-09-09: qty 30

## 2016-09-09 MED ORDER — FLUTICASONE PROPIONATE 50 MCG/ACT NA SUSP
2.0000 | Freq: Every day | NASAL | Status: DC
  Administered 2016-09-10: 2 via NASAL
  Filled 2016-09-09: qty 16

## 2016-09-09 MED ORDER — MENTHOL 3 MG MT LOZG: 1.0000 | LOZENGE | OROMUCOSAL | Status: DC | PRN

## 2016-09-09 MED ORDER — BISACODYL 10 MG RE SUPP: 10.0000 mg | Freq: Every day | RECTAL | Status: DC | PRN

## 2016-09-09 MED ORDER — SUCCINYLCHOLINE CHLORIDE 200 MG/10ML IV SOSY
PREFILLED_SYRINGE | INTRAVENOUS | Status: AC
  Filled 2016-09-09: qty 10

## 2016-09-09 MED ORDER — ALUM & MAG HYDROXIDE-SIMETH 200-200-20 MG/5ML PO SUSP: 30.0000 mL | Freq: Four times a day (QID) | ORAL | Status: DC | PRN

## 2016-09-09 MED ORDER — HEMOSTATIC AGENTS (NO CHARGE) OPTIME
TOPICAL | Status: DC | PRN
  Administered 2016-09-09: 1 via TOPICAL

## 2016-09-09 MED ORDER — FENTANYL CITRATE (PF) 250 MCG/5ML IJ SOLN
INTRAMUSCULAR | Status: AC
  Filled 2016-09-09: qty 5

## 2016-09-09 MED ORDER — VANCOMYCIN HCL IN DEXTROSE 750-5 MG/150ML-% IV SOLN
750.0000 mg | Freq: Once | INTRAVENOUS | Status: AC
Start: 2016-09-09 — End: 2016-09-09
  Administered 2016-09-09: 750 mg via INTRAVENOUS
  Filled 2016-09-09: qty 150

## 2016-09-09 MED ORDER — LORATADINE 10 MG PO TABS
10.0000 mg | ORAL_TABLET | Freq: Every day | ORAL | Status: DC
  Administered 2016-09-10: 10 mg via ORAL
  Filled 2016-09-09: qty 1

## 2016-09-09 MED ORDER — ZOLPIDEM TARTRATE 5 MG PO TABS: 5.0000 mg | ORAL_TABLET | Freq: Every evening | ORAL | Status: DC | PRN

## 2016-09-09 MED ORDER — PHENOL 1.4 % MT LIQD: 1.0000 | OROMUCOSAL | Status: DC | PRN

## 2016-09-09 MED ORDER — 0.9 % SODIUM CHLORIDE (POUR BTL) OPTIME
TOPICAL | Status: DC | PRN
  Administered 2016-09-09: 1000 mL

## 2016-09-09 MED ORDER — LIDOCAINE-EPINEPHRINE 1 %-1:100000 IJ SOLN
INTRAMUSCULAR | Status: DC | PRN
  Administered 2016-09-09: 5 mL

## 2016-09-09 MED ORDER — MEPERIDINE HCL 25 MG/ML IJ SOLN: 6.2500 mg | INTRAMUSCULAR | Status: DC | PRN

## 2016-09-09 MED ORDER — ONDANSETRON HCL 4 MG/2ML IJ SOLN: 4.0000 mg | Freq: Four times a day (QID) | INTRAMUSCULAR | Status: DC | PRN

## 2016-09-09 MED ORDER — OXYCODONE-ACETAMINOPHEN 5-325 MG PO TABS
1.0000 | ORAL_TABLET | ORAL | Status: DC | PRN
  Administered 2016-09-09 – 2016-09-10 (×4): 2 via ORAL
  Filled 2016-09-09 (×4): qty 2

## 2016-09-09 MED ORDER — PHENYLEPHRINE HCL 10 MG/ML IJ SOLN
INTRAVENOUS | Status: DC | PRN
  Administered 2016-09-09: 20 ug/min via INTRAVENOUS

## 2016-09-09 MED ORDER — SUCCINYLCHOLINE CHLORIDE 200 MG/10ML IV SOSY
PREFILLED_SYRINGE | INTRAVENOUS | Status: DC | PRN
Start: 2016-09-09 — End: 2016-09-09
  Administered 2016-09-09: 100 mg via INTRAVENOUS

## 2016-09-09 MED ORDER — PROMETHAZINE HCL 25 MG/ML IJ SOLN: 6.2500 mg | INTRAMUSCULAR | Status: DC | PRN

## 2016-09-09 MED ORDER — OXYCODONE HCL 5 MG PO TABS: 5.0000 mg | ORAL_TABLET | ORAL | Status: DC | PRN

## 2016-09-09 MED ORDER — LIDOCAINE HCL (CARDIAC) 20 MG/ML IV SOLN
INTRAVENOUS | Status: DC | PRN
  Administered 2016-09-09: 60 mg via INTRAVENOUS

## 2016-09-09 MED ORDER — ACETAMINOPHEN 650 MG RE SUPP: 650.0000 mg | RECTAL | Status: DC | PRN

## 2016-09-09 MED ORDER — ONDANSETRON HCL 4 MG PO TABS: 4.0000 mg | ORAL_TABLET | Freq: Four times a day (QID) | ORAL | Status: DC | PRN

## 2016-09-09 MED ORDER — DOXYLAMINE SUCCINATE (SLEEP) 25 MG PO TABS
25.0000 mg | ORAL_TABLET | Freq: Every day | ORAL | Status: DC
  Filled 2016-09-09: qty 1

## 2016-09-09 MED ORDER — SENNOSIDES-DOCUSATE SODIUM 8.6-50 MG PO TABS: 1.0000 | ORAL_TABLET | Freq: Every evening | ORAL | Status: DC | PRN

## 2016-09-09 MED ORDER — MORPHINE SULFATE (PF) 4 MG/ML IV SOLN
2.0000 mg | INTRAVENOUS | Status: DC | PRN
  Administered 2016-09-09: 2 mg via INTRAVENOUS
  Filled 2016-09-09: qty 1

## 2016-09-09 MED ORDER — MUPIROCIN 2 % EX OINT
1.0000 "application " | TOPICAL_OINTMENT | Freq: Once | CUTANEOUS | Status: AC
  Administered 2016-09-09: 1 via TOPICAL
  Filled 2016-09-09: qty 22

## 2016-09-09 MED ORDER — MORPHINE SULFATE (PF) 2 MG/ML IV SOLN: 2.0000 mg | INTRAVENOUS | Status: DC | PRN

## 2016-09-09 MED ORDER — BUPIVACAINE HCL (PF) 0.5 % IJ SOLN
INTRAMUSCULAR | Status: DC | PRN
  Administered 2016-09-09: 5 mL

## 2016-09-09 MED ORDER — PROPOFOL 10 MG/ML IV BOLUS
INTRAVENOUS | Status: AC
  Filled 2016-09-09: qty 20

## 2016-09-09 MED ORDER — ADULT MULTIVITAMIN W/MINERALS CH
1.0000 | ORAL_TABLET | Freq: Every day | ORAL | Status: DC
  Administered 2016-09-10: 1 via ORAL
  Filled 2016-09-09: qty 1

## 2016-09-09 MED ORDER — LEVOTHYROXINE SODIUM 150 MCG PO TABS
150.0000 ug | ORAL_TABLET | Freq: Every day | ORAL | Status: DC
  Administered 2016-09-10: 150 ug via ORAL
  Filled 2016-09-09: qty 1

## 2016-09-09 MED ORDER — SODIUM CHLORIDE 0.9% FLUSH
3.0000 mL | Freq: Two times a day (BID) | INTRAVENOUS | Status: DC
  Administered 2016-09-09: 3 mL via INTRAVENOUS

## 2016-09-09 MED ORDER — ATORVASTATIN CALCIUM 40 MG PO TABS
40.0000 mg | ORAL_TABLET | Freq: Every day | ORAL | Status: DC
  Administered 2016-09-09: 40 mg via ORAL
  Filled 2016-09-09: qty 1
  Filled 2016-09-09: qty 2

## 2016-09-09 MED ORDER — METHOCARBAMOL 500 MG PO TABS
500.0000 mg | ORAL_TABLET | Freq: Four times a day (QID) | ORAL | Status: DC | PRN
  Administered 2016-09-09 – 2016-09-10 (×3): 500 mg via ORAL
  Filled 2016-09-09 (×3): qty 1

## 2016-09-09 MED ORDER — SODIUM CHLORIDE 0.9 % IV SOLN: 250.0000 mL | INTRAVENOUS | Status: DC

## 2016-09-09 MED ORDER — VANCOMYCIN HCL IN DEXTROSE 1-5 GM/200ML-% IV SOLN
1000.0000 mg | INTRAVENOUS | Status: AC
  Administered 2016-09-09: 1000 mg via INTRAVENOUS
  Filled 2016-09-09: qty 200

## 2016-09-09 MED ORDER — VITAMIN D 1000 UNITS PO TABS
5000.0000 [IU] | ORAL_TABLET | Freq: Every day | ORAL | Status: DC
  Administered 2016-09-09: 5000 [IU] via ORAL
  Filled 2016-09-09: qty 5

## 2016-09-09 MED ORDER — PANTOPRAZOLE SODIUM 40 MG IV SOLR: 40.0000 mg | Freq: Every day | INTRAVENOUS | Status: DC

## 2016-09-09 MED ORDER — CHLORHEXIDINE GLUCONATE CLOTH 2 % EX PADS: 6.0000 | MEDICATED_PAD | Freq: Once | CUTANEOUS | Status: DC

## 2016-09-09 MED ORDER — LACTATED RINGERS IV SOLN: INTRAVENOUS | Status: DC

## 2016-09-09 MED ORDER — MECLIZINE HCL 12.5 MG PO TABS: 12.5000 mg | ORAL_TABLET | Freq: Three times a day (TID) | ORAL | Status: DC | PRN

## 2016-09-09 MED ORDER — ONDANSETRON HCL 4 MG/2ML IJ SOLN
INTRAMUSCULAR | Status: DC | PRN
  Administered 2016-09-09: 4 mg via INTRAVENOUS

## 2016-09-09 MED ORDER — DEXAMETHASONE SODIUM PHOSPHATE 10 MG/ML IJ SOLN
INTRAMUSCULAR | Status: DC | PRN
  Administered 2016-09-09: 4 mg via INTRAVENOUS

## 2016-09-09 MED ORDER — HYDROMORPHONE HCL 1 MG/ML IJ SOLN
INTRAMUSCULAR | Status: AC
  Administered 2016-09-09: 0.25 mg via INTRAVENOUS
  Filled 2016-09-09: qty 0.5

## 2016-09-09 MED ORDER — METHOCARBAMOL 1000 MG/10ML IJ SOLN: 500.0000 mg | Freq: Four times a day (QID) | INTRAMUSCULAR | Status: DC | PRN

## 2016-09-09 MED ORDER — DOCUSATE SODIUM 100 MG PO CAPS
100.0000 mg | ORAL_CAPSULE | Freq: Two times a day (BID) | ORAL | Status: DC
  Administered 2016-09-09 – 2016-09-10 (×2): 100 mg via ORAL
  Filled 2016-09-09 (×2): qty 1

## 2016-09-09 MED ORDER — METHOCARBAMOL 500 MG PO TABS: 500.0000 mg | ORAL_TABLET | ORAL | Status: DC | PRN

## 2016-09-09 MED ORDER — VITAMIN D-3 125 MCG (5000 UT) PO TABS: 5000.0000 [IU] | ORAL_TABLET | Freq: Every day | ORAL | Status: DC

## 2016-09-09 MED ORDER — DIPHENHYDRAMINE HCL (SLEEP) 25 MG PO TABS: 25.0000 mg | ORAL_TABLET | Freq: Every day | ORAL | Status: DC

## 2016-09-09 MED ORDER — FENTANYL CITRATE (PF) 100 MCG/2ML IJ SOLN
INTRAMUSCULAR | Status: DC | PRN
  Administered 2016-09-09 (×4): 50 ug via INTRAVENOUS

## 2016-09-09 MED ORDER — IRBESARTAN 300 MG PO TABS
300.0000 mg | ORAL_TABLET | Freq: Every day | ORAL | Status: DC
  Administered 2016-09-10: 300 mg via ORAL
  Filled 2016-09-09 (×2): qty 1

## 2016-09-09 MED ORDER — ACETAMINOPHEN 325 MG PO TABS: 650.0000 mg | ORAL_TABLET | ORAL | Status: DC | PRN

## 2016-09-09 MED ORDER — PANTOPRAZOLE SODIUM 40 MG PO TBEC
40.0000 mg | DELAYED_RELEASE_TABLET | Freq: Every day | ORAL | Status: DC
  Administered 2016-09-10: 40 mg via ORAL
  Filled 2016-09-09: qty 1

## 2016-09-09 MED ORDER — LIDOCAINE 2% (20 MG/ML) 5 ML SYRINGE
INTRAMUSCULAR | Status: AC
  Filled 2016-09-09: qty 5

## 2016-09-09 MED ORDER — LIDOCAINE-EPINEPHRINE 1 %-1:100000 IJ SOLN
INTRAMUSCULAR | Status: AC
  Filled 2016-09-09: qty 1

## 2016-09-09 MED ORDER — PROPOFOL 10 MG/ML IV BOLUS
INTRAVENOUS | Status: DC | PRN
  Administered 2016-09-09: 130 mg via INTRAVENOUS

## 2016-09-09 MED ORDER — HYDROMORPHONE HCL 1 MG/ML IJ SOLN
0.2500 mg | INTRAMUSCULAR | Status: DC | PRN
  Administered 2016-09-09 (×2): 0.25 mg via INTRAVENOUS

## 2016-09-09 MED ORDER — THROMBIN 5000 UNITS EX SOLR
CUTANEOUS | Status: DC | PRN
  Administered 2016-09-09: 10000 [IU] via TOPICAL

## 2016-09-09 SURGICAL SUPPLY — 59 items
BLADE CLIPPER SURG (BLADE) IMPLANT
BOLT 5.0X50 SPINAL (Bolt) ×6 IMPLANT
BOLT PLATE XLIF 5.0X60 ST (Bolt) ×2 IMPLANT
BOLT PLATE XLIF 5.0X60MM ST (Bolt) ×1 IMPLANT
CARTRIDGE OIL MAESTRO DRILL (MISCELLANEOUS) IMPLANT
COVER BACK TABLE 24X17X13 BIG (DRAPES) IMPLANT
DECANTER SPIKE VIAL GLASS SM (MISCELLANEOUS) ×3 IMPLANT
DERMABOND ADVANCED (GAUZE/BANDAGES/DRESSINGS) ×2
DERMABOND ADVANCED .7 DNX12 (GAUZE/BANDAGES/DRESSINGS) ×1 IMPLANT
DIFFUSER DRILL AIR PNEUMATIC (MISCELLANEOUS) IMPLANT
DRAPE C-ARM 42X72 X-RAY (DRAPES) ×3 IMPLANT
DRAPE C-ARMOR (DRAPES) ×3 IMPLANT
DRAPE LAPAROTOMY 100X72X124 (DRAPES) ×3 IMPLANT
DRAPE POUCH INSTRU U-SHP 10X18 (DRAPES) ×3 IMPLANT
DRSG OPSITE POSTOP 3X4 (GAUZE/BANDAGES/DRESSINGS) ×6 IMPLANT
DURAPREP 26ML APPLICATOR (WOUND CARE) ×3 IMPLANT
ELECT REM PT RETURN 9FT ADLT (ELECTROSURGICAL) ×3
ELECTRODE REM PT RTRN 9FT ADLT (ELECTROSURGICAL) ×1 IMPLANT
GAUZE SPONGE 4X4 16PLY XRAY LF (GAUZE/BANDAGES/DRESSINGS) IMPLANT
GLOVE BIO SURGEON STRL SZ8 (GLOVE) ×3 IMPLANT
GLOVE BIOGEL PI IND STRL 8 (GLOVE) ×1 IMPLANT
GLOVE BIOGEL PI IND STRL 8.5 (GLOVE) ×1 IMPLANT
GLOVE BIOGEL PI INDICATOR 8 (GLOVE) ×2
GLOVE BIOGEL PI INDICATOR 8.5 (GLOVE) ×2
GLOVE ECLIPSE 8.0 STRL XLNG CF (GLOVE) ×3 IMPLANT
GLOVE EXAM NITRILE LRG STRL (GLOVE) IMPLANT
GLOVE EXAM NITRILE XL STR (GLOVE) IMPLANT
GLOVE EXAM NITRILE XS STR PU (GLOVE) IMPLANT
GOWN STRL REUS W/ TWL LRG LVL3 (GOWN DISPOSABLE) IMPLANT
GOWN STRL REUS W/ TWL XL LVL3 (GOWN DISPOSABLE) ×2 IMPLANT
GOWN STRL REUS W/TWL 2XL LVL3 (GOWN DISPOSABLE) IMPLANT
GOWN STRL REUS W/TWL LRG LVL3 (GOWN DISPOSABLE)
GOWN STRL REUS W/TWL XL LVL3 (GOWN DISPOSABLE) ×4
KIT BASIN OR (CUSTOM PROCEDURE TRAY) ×3 IMPLANT
KIT DILATOR XLIF 5 (KITS) ×1 IMPLANT
KIT INFUSE XX SMALL 0.7CC (Orthopedic Implant) ×3 IMPLANT
KIT ROOM TURNOVER OR (KITS) ×3 IMPLANT
KIT SURGICAL ACCESS MAXCESS 4 (KITS) ×3 IMPLANT
KIT XLIF (KITS) ×2
MODULE NVM5 NEXT GEN EMG (NEEDLE) ×3 IMPLANT
MODULUS XLW 10X22X50MM 10DEG (Spine Construct) ×3 IMPLANT
NEEDLE HYPO 25X1 1.5 SAFETY (NEEDLE) ×3 IMPLANT
NS IRRIG 1000ML POUR BTL (IV SOLUTION) ×3 IMPLANT
OIL CARTRIDGE MAESTRO DRILL (MISCELLANEOUS)
PACK LAMINECTOMY NEURO (CUSTOM PROCEDURE TRAY) ×3 IMPLANT
PLATE 4H DECADE SPINAL (Plate) ×3 IMPLANT
PUTTY BONE ATTRAX 5CC STRIP (Putty) ×3 IMPLANT
SCREW DECADE 5.5X50 (Screw) ×6 IMPLANT
SPONGE LAP 4X18 X RAY DECT (DISPOSABLE) IMPLANT
SPONGE SURGIFOAM ABS GEL SZ50 (HEMOSTASIS) ×3 IMPLANT
STAPLER SKIN PROX WIDE 3.9 (STAPLE) ×3 IMPLANT
SUT VIC AB 1 CT1 18XBRD ANBCTR (SUTURE) ×1 IMPLANT
SUT VIC AB 1 CT1 8-18 (SUTURE) ×2
SUT VIC AB 2-0 CT1 18 (SUTURE) ×3 IMPLANT
SUT VIC AB 3-0 SH 8-18 (SUTURE) ×3 IMPLANT
TOWEL GREEN STERILE (TOWEL DISPOSABLE) ×3 IMPLANT
TOWEL GREEN STERILE FF (TOWEL DISPOSABLE) ×3 IMPLANT
TRAY FOLEY W/METER SILVER 16FR (SET/KITS/TRAYS/PACK) ×3 IMPLANT
WATER STERILE IRR 1000ML POUR (IV SOLUTION) ×3 IMPLANT

## 2016-09-09 NOTE — Brief Op Note (Signed)
09/09/2016  12:04 PM  PATIENT:  Latoya Stevenson  76 y.o. female  PRE-OPERATIVE DIAGNOSIS:  Spinal stenosis of lumbar region with neurogenic claudication, scoliosis, spondylolisthesis, radiculopathy, lumbago L 23  POST-OPERATIVE DIAGNOSIS:  Spinal stenosis of lumbar region with neurogenic claudication, scoliosis, spondylolisthesis, radiculopathy, lumbago L 23  PROCEDURE:  Procedure(s) with comments: Left Lumbar two-three Anterolateral lumbar interbody fusion with lateral plate (Left) - Left L3-7 Anterolateral lumbar interbody fusion with lateral plate  SURGEON:  Surgeon(s) and Role:    Erline Levine, MD - Primary    Kristeen Miss, MD - Assisting  PHYSICIAN ASSISTANT:   ASSISTANTS: [place], RN   ANESTHESIA:   general  EBL:  Total I/O In: 1000 [I.V.:1000] Out: -   BLOOD ADMINISTERED:none  DRAINS: none   LOCAL MEDICATIONS USED:  MARCAINE    and LIDOCAINE   SPECIMEN:  No Specimen  DISPOSITION OF SPECIMEN:  N/A  COUNTS:  YES  TOURNIQUET:  * No tourniquets in log *  DICTATION: Patient is a 76 year old with severe spondylosis stenosis and scoliosis of the lumbar spine with spodylolisthesis. It was elected to take her to surgery for anterolateral decompression and lateral plate fixation at the L 23 level.  She had previously undergone fusion L 3 - L 5  levels.  Procedure: Patient was brought to the operating room and placed in a right lateral decubitus position on the operative table and using orthogonally projected C-arm fluoroscopy the patient was placed so that the L 23  level was visualized in AP and lateral plane. The patient was then taped into position. The table was flexed so as to expose the L 23 level. Skin was marked along with a posterior finger dissection incision. Her flank was then prepped and draped in usual sterile fashion and incisions were made overlying the 12 th rib. Posterior finger dissection was made to enter the retroperitoneal space and then subsequently  the probe was inserted into the psoas muscle from the right side initially at the L23 level. After mapping the neural elements were able to dock the probe per the midpoint of this vertebral level and without indications electrically of too close proximity to the neural tissues. Subsequently the self-retaining tractor was.after sequential dilators were utilized the shim was employed and the interspace was cleared of psoas muscle and then incised. A thorough discectomy was performed. Instruments were used to clear the interspace of disc material. After thorough discectomy was performed and this was performed using AP and lateral fluoroscopy a 10 lordotic by 50 x 22 mm x 10 degree implant was packed with extra extra small BMP and Attrax. This was tamped into position using the slides and its position was confirmed on AP and lateral fluoroscopy. A Decade lateral plate (size 10) was affixed to the lateral spine with 5.0 x 50 mm screws posteriorly and 5.5 x 50 mm bolts anteriorly. All screws were torque locked and positioning was confirmed with AP and lateral fluoroscopy. . Hemostasis was assured the wounds were irrigated interrupted Vicryl sutures.Sterile occlusive dressing was placed with Dermabond. The patient was then extubated in the operating room and taken to recovery in stable and satisfactory condition having tolerated his operation well. Counts were correct at the end of the case.  PLAN OF CARE: Admit to inpatient   PATIENT DISPOSITION:  PACU - hemodynamically stable.   Delay start of Pharmacological VTE agent (>24hrs) due to surgical blood loss or risk of bleeding: yes

## 2016-09-09 NOTE — Anesthesia Postprocedure Evaluation (Signed)
Anesthesia Post Note  Patient: Latoya Stevenson  Procedure(s) Performed: Procedure(s) (LRB): Left Lumbar two-three Anterolateral lumbar interbody fusion with lateral plate (Left)     Patient location during evaluation: PACU Anesthesia Type: General Level of consciousness: awake and alert Pain management: pain level controlled Vital Signs Assessment: post-procedure vital signs reviewed and stable Respiratory status: spontaneous breathing, nonlabored ventilation, respiratory function stable and patient connected to nasal cannula oxygen Cardiovascular status: blood pressure returned to baseline and stable Postop Assessment: no signs of nausea or vomiting Anesthetic complications: no   Last Vitals:  Vitals:   09/09/16 1300 09/09/16 1332  BP: (!) 151/78 (!) 150/81  Pulse: (!) 57 (!) 59  Resp: 15 16  Temp: 36.2 C 36.2 C                  Effie Berkshire

## 2016-09-09 NOTE — H&P (Signed)
Patient ID:   [id number] Patient: Latoya Stevenson  Date of Birth: [birth year] Visit Type: Office Visit   Date: 08/27/2016 11:45 AM Provider: Marchia Meiers. Vertell Limber MD   This 76 year old female presents for back pain.   History of Present Illness: 1.  back pain  Patient reports worsening hip, leg pain (9-10/10 at night). She reports pain medication does not relieve the pain.  MRI 07/30/16: L5 is transitional as seen previously. Interval fusion from L3-L5 with pedicle screws and posterior rods. Resolution of the endplate edema and facet edema at L4-5. Mild stenosis of the lateral recesses at L3-4 but without definite neural compression in this segment. Worsening of adjacent segment degenerative disease at L2-3. Retrolisthesis of 4 mm. Endplate osteophytes and bulging of the disc more prominent towards the left. Bilateral facet arthropathy. Spinal stenosis which could cause neural compression on either side. Findings are somewhat worse on the left.           MEDICATIONS(added, continued or stopped this visit): Started Medication Directions Instruction Stopped   amlodipine 5 mg tablet Take as directed     atorvastatin 40 mg tablet Take as directed     calcium Take as directed    11/16/2014 Cymbalta 60 mg capsule,delayed release take 1 capsule by oral route  every day     diphenhydramine 25 mg tablet Take as directed     fexofenadine 180 mg tablet Take as directed     Flonase 50 mcg/actuation nasal spray,suspension Use as directed     irbesartan 300 mg tablet Take as directed     levothyroxine 150 mcg tablet Take as directed     multivitamin with minerals Take as directed     nabumetone 500 mg tablet take 1 tablet by oral route 2 times every day     omeprazole 20 mg capsule,delayed release Take as directed     Osteo Bi-Flex Take as directed    07/23/2016 Percocet 5 mg-325 mg tablet take 1 tablet by oral route 2 times every day as needed     Sleep Aid (doxylamine) 25 mg tablet Take as  directed     Tylenol 325 mg tablet Take as directed     Vitamin D3 5,000 unit tablet Take as directed     vitamin E 400 unit capsule Take as directed    11/16/2014 Xanax 0.25 mg tablet take 1 tablet by oral route  qHS prn       ALLERGIES: Ingredient Reaction Medication Name Comment  NITROFURANTOIN MACROCRYSTALLINE Swelling Macrodantin   CELECOXIB Swelling Celebrex   PENICILLINS Unknown    METRONIDAZOLE Swelling Flagyl       Vitals Date Temp F BP Pulse Ht In Wt Lb BMI BSA Pain Score  08/27/2016  165/87 94 63 177.2 31.39  5/10      IMPRESSION MRI shows spinal stenosis and retrolisthesis at L2-3     Pain Management Plan Pain Scale: 5/10. Method: Numeric Pain Intensity Scale. Location: front right hip down leg  foot. Onset: 05/29/2016. Duration: varies. Quality: discomforting. Pain management follow-up plan of care: Patient taking medication as prescribed..  Fall Risk Plan The patient has not fallen in the last year.  Patient will be scheduled for XLIF L2-3 with lateral plate from the left. Nurse education given.             Provider:  Vertell Limber MD, Marchia Meiers 08/27/2016 12:34 PM  Dictation edited by: Dionne Bucy    CC Providers: Glendon Axe [address]  [place],  [place]  [id number]-   Benjamin Chasnis  [place], [place] [address]-              Electronically signed by Marchia Meiers Vertell Limber MD on 08/30/2016 02:06 PM  Patient ID:   [id number] Patient: Latoya Stevenson  Date of Birth: 1941/01/14 Visit Type: Office Visit   Date: 01/24/2015 02:15 PM Provider: Marchia Meiers. Vertell Limber MD   This 76 year old female presents for back pain and Leg pain.  History of Present Illness: 1.  back pain  2.  Leg pain  10/27/14 L3-L4, L4-L5 redo laminectomy with fusion  Patient is pleased with her progress, stating "95% better".  She is increasing her activities, and taking Tylenol only as needed.  She reports no pain at present.  X-rays on  Canopy      Medical/Surgical/Interim History Reviewed, no change.  Last detailed document date:09/13/2014.   PAST MEDICAL HISTORY, SURGICAL HISTORY, FAMILY HISTORY, SOCIAL HISTORY AND REVIEW OF SYSTEMS I have reviewed the patient's past medical, surgical, family and social history as well as the comprehensive review of systems as included on the [facility] history form dated 09/13/2014, which I have signed.  Family History: Reviewed, no changes.  Last detailed document: 09/13/2014.  Patient reports there is no relevant family history.   Social History: Tobacco use reviewed. Reviewed, no changes. Last detailed document date: 09/13/2014.      MEDICATIONS(added, continued or stopped this visit): Started Medication Directions Instruction Stopped   amlodipine 5 mg tablet Take as directed     atorvastatin 40 mg tablet Take as directed     calcium Take as directed    11/16/2014 Cymbalta 60 mg capsule,delayed release take 1 capsule by oral route  every day     diphenhydramine 25 mg tablet Take as directed     fexofenadine 180 mg tablet Take as directed     Flonase 50 mcg/actuation nasal spray,suspension Use as directed     irbesartan 300 mg tablet Take as directed     levothyroxine 150 mcg tablet Take as directed     multivitamin with minerals Take as directed     nabumetone 500 mg tablet take 1 tablet by oral route 2 times every day     omeprazole 20 mg capsule,delayed release Take as directed     Osteo Bi-Flex Take as directed    10/11/2014 Percocet 5 mg-325 mg tablet take 1 tablet by oral route 3 times every day as needed     Sleep Aid (doxylamine) 25 mg tablet Take as directed     Tylenol 325 mg tablet Take as directed     Vitamin D3 5,000 unit tablet Take as directed     vitamin E 400 unit capsule Take as directed    11/16/2014 Xanax 0.25 mg tablet take 1 tablet by oral route  qHS prn       ALLERGIES: Ingredient Reaction Medication Name Comment   NITROFURANTOIN MACROCRYSTALLINE Swelling Macrodantin   CELECOXIB Swelling Celebrex   PENICILLINS Unknown    METRONIDAZOLE Swelling Flagyl    Reviewed, no changes.    Vitals Date Temp F BP Pulse Ht In Wt Lb BMI BSA Pain Score  01/24/2015  150/91 97 63 174 30.82  0/10     DIAGNOSTIC RESULTS AP and lateral lumbar radiographs reveal well-positioned screws and rods L3-L5 levels.    IMPRESSION Samaia Iwata is doing very well.  Assessment/Plan # Detail Type Description   1. 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       2. Assessment Scoliosis (and kyphoscoliosis), idiopathic (M41.20).       3. Assessment Lumbar radiculopathy (M54.16).       4. Assessment Low back pain, unspecified back pain laterality, with sciatica presence unspecified (M54.5).       5. Assessment Spinal stenosis, lumbar region (M48.06).         Pain Assessment/Treatment Pain Scale: 0/10. Method: Numeric Pain Intensity Scale. Location: back/leg. Onset: 10/10/2013. Duration: varies. Quality: discomforting. Pain Assessment/Treatment follow-up plan of care: Patient is taking medications as prescribed..  Fall Risk Plan The patient has not fallen in the last year.  She will continue to increase her activity gradually, wean out of her brace, and return on an as-needed basis.  Orders: Diagnostic Procedures: Assessment Procedure  M41.20 Return to Clinic             Provider:  Marchia Meiers. Vertell Limber MD  01/31/2015 08:09 AM Dictation edited by: Mike Craze. Poteat RN    CC Providers: Sharlet Sal[place], [place] [address]-              Electronically signed by Marchia Meiers Vertell Limber MD on 01/31/2015 05:41 PM

## 2016-09-09 NOTE — Anesthesia Preprocedure Evaluation (Addendum)
Anesthesia Evaluation  Patient identified by MRN, date of birth, ID band Patient awake    Reviewed: Allergy & Precautions, NPO status , Patient's Chart, lab work & pertinent test results  Airway Mallampati: I  TM Distance: >3 FB Neck ROM: Full    Dental  (+) Edentulous Upper, Edentulous Lower   Pulmonary neg pulmonary ROS,    breath sounds clear to auscultation       Cardiovascular hypertension, Pt. on medications  Rhythm:Regular Rate:Normal     Neuro/Psych  Headaches, negative psych ROS   GI/Hepatic Neg liver ROS, GERD  Medicated and Controlled,  Endo/Other  Hypothyroidism   Renal/GU negative Renal ROS  negative genitourinary   Musculoskeletal  (+) Arthritis , Osteoarthritis,    Abdominal Normal abdominal exam  (+)   Peds negative pediatric ROS (+)  Hematology negative hematology ROS (+)   Anesthesia Other Findings - HLD  Reproductive/Obstetrics negative OB ROS                           Lab Results  Component Value Date   WBC 5.8 09/09/2016   HGB 14.0 09/09/2016   HCT 43.2 09/09/2016   MCV 88.3 09/09/2016   PLT 150 09/09/2016   Lab Results  Component Value Date   CREATININE 1.26 (H) 09/09/2016   BUN 19 09/09/2016   NA 138 09/09/2016   K 5.0 09/09/2016   CL 107 09/09/2016   CO2 20 (L) 09/09/2016   Lab Results  Component Value Date   INR 1.11 08/16/2009   EKG: normal sinus rhythm.  Anesthesia Physical Anesthesia Plan  ASA: II  Anesthesia Plan: General   Post-op Pain Management:    Induction: Intravenous  PONV Risk Score and Plan: 4 or greater and Ondansetron, Dexamethasone, Propofol, Midazolam and Scopolamine patch - Pre-op  Airway Management Planned: Oral ETT  Additional Equipment:   Intra-op Plan:   Post-operative Plan: Extubation in OR  Informed Consent: I have reviewed the patients History and Physical, chart, labs and discussed the procedure including  the risks, benefits and alternatives for the proposed anesthesia with the patient or authorized representative who has indicated his/her understanding and acceptance.   Dental advisory given  Plan Discussed with: CRNA, Anesthesiologist and Surgeon  Anesthesia Plan Comments:        Anesthesia Quick Evaluation

## 2016-09-09 NOTE — Interval H&P Note (Signed)
History and Physical Interval Note:  09/09/2016 9:38 AM  Latoya Stevenson  has presented today for surgery, with the diagnosis of Spinal stenosis of lumbar region with neurogenic claudication  The various methods of treatment have been discussed with the patient and family. After consideration of risks, benefits and other options for treatment, the patient has consented to  Procedure(s) with comments: Left L2-3 Anterolateral lumbar interbody fusion with lateral plate (Left) - Left K1-0 Anterolateral lumbar interbody fusion with lateral plate as a surgical intervention .  [name] history has been reviewed, patient examined, no change in status, stable for surgery.  I have reviewed the patient's chart and labs.  Questions were answered to the patient's satisfaction.     Jamyah Folk D

## 2016-09-09 NOTE — Evaluation (Signed)
Physical Therapy Evaluation Patient Details Name: Latoya Stevenson MRN: [id number] DOB: 03-05-1941 Today's Date: 09/09/2016   History of Present Illness  Pt is 76 y/o female s/p L2-3 ALIF. PMH includes HTN, headaches, and previous back surgery.   Clinical Impression  Pt is s/p surgery above with deficits below. PTA, pt was independent with functional mobility. Upon evaluation, pt with increased pain, weakness, and decreased balance. Pt reports falling on way into hospital this morning so that may have contributed to increased pain. Pt required min A for steadying without AD and had R knee buckling. With AD decreased assist to min guard for safety. Recommending RW for home. Pt reports she has friends that will check on her intermittently. Will continue to follow acutely to maximize functional mobility independence.     Follow Up Recommendations No PT follow up;Supervision for mobility/OOB    Equipment Recommendations  Rolling walker with 5" wheels    Recommendations for Other Services       Precautions / Restrictions Precautions Precautions: Back Precaution Booklet Issued: Yes (comment) Precaution Comments: Reviewed back precaution handout with pt.  Required Braces or Orthoses: Spinal Brace Spinal Brace: Lumbar corset;Applied in sitting position Restrictions Weight Bearing Restrictions: No      Mobility  Bed Mobility Overal bed mobility: Needs Assistance Bed Mobility: Rolling;Sidelying to Sit Rolling: Min guard Sidelying to sit: Min guard       General bed mobility comments: Min guard to ensure appropriate log roll technique. Pt reporting increased pain with transition to sitting.   Transfers Overall transfer level: Needs assistance Equipment used: None;Rolling walker (2 wheeled) Transfers: Sit to/from Stand Sit to Stand: Min guard         General transfer comment: Min guard for safety. Sit<>Stand X 2 secondary to decreased balance during gait without use of AD.    Ambulation/Gait Ambulation/Gait assistance: Min assist;Min guard Ambulation Distance (Feet): 100 Feet Assistive device: Rolling walker (2 wheeled);None Gait Pattern/deviations: Step-through pattern;Decreased stride length;Antalgic;Decreased weight shift to right Gait velocity: Decreased Gait velocity interpretation: Below normal speed for age/gender General Gait Details: Attempted gait in the room without use of AD. Pt unsteady, with R knee buckling, and required min A for stability. With use of RW, able to decrease assist to min guard for safety. Distance limited secondary to pain.   Stairs            Wheelchair Mobility    Modified Rankin (Stroke Patients Only)       Balance Overall balance assessment: Needs assistance Sitting-balance support: No upper extremity supported;Feet supported Sitting balance-Leahy Scale: Good     Standing balance support: Bilateral upper extremity supported;During functional activity Standing balance-Leahy Scale: Poor Standing balance comment: Reliant on UE support for balance.                              Pertinent Vitals/Pain Pain Assessment: 0-10 Pain Score: 10-Worst pain ever Pain Location: back  Pain Descriptors / Indicators: Aching;Operative site guarding;Sore Pain Intervention(s): Limited activity within patient's tolerance;Monitored during session;Repositioned    Home Living Family/patient expects to be discharged to:: Private residence Living Arrangements: Alone Available Help at Discharge: Friend(s);Available PRN/intermittently Type of Home: House Home Access: Stairs to enter Entrance Stairs-Rails: Left;Right;Can reach both Entrance Stairs-Number of Steps: 2 Home Layout: One level Home Equipment: Walker - 4 wheels;Cane - single point;Bedside commode;Shower seat      Prior Function Level of Independence: Independent  Hand Dominance        Extremity/Trunk Assessment   Upper Extremity  Assessment Upper Extremity Assessment: Defer to OT evaluation    Lower Extremity Assessment Lower Extremity Assessment: Generalized weakness;RLE deficits/detail;LLE deficits/detail RLE Deficits / Details: Pain radiating into hips  LLE Deficits / Details: Pain raidiating into hip     Cervical / Trunk Assessment Cervical / Trunk Assessment: Other exceptions Cervical / Trunk Exceptions: s/p back surgery   Communication   Communication: No difficulties  Cognition Arousal/Alertness: Awake/alert Behavior During Therapy: WFL for tasks assessed/performed Overall Cognitive Status: Within Functional Limits for tasks assessed                                        General Comments General comments (skin integrity, edema, etc.): Educated about generalized exercise program to perform at home     Exercises     Assessment/Plan    PT Assessment Patient needs continued PT services  PT Problem List Decreased strength;Decreased activity tolerance;Decreased balance;Decreased mobility;Decreased knowledge of precautions;Pain       PT Treatment Interventions DME instruction;Gait training;Stair training;Balance training;Therapeutic exercise;Therapeutic activities;Functional mobility training;Neuromuscular re-education;Patient/family education    PT Goals (Current goals can be found in the Care Plan section)  Acute Rehab PT Goals Patient Stated Goal: to decrease pain  PT Goal Formulation: With patient Time For Goal Achievement: 09/16/16 Potential to Achieve Goals: Good    Frequency Min 5X/week   Barriers to discharge Decreased caregiver support Lives alone, but reports family can check in on pt     Co-evaluation               AM-PAC PT "6 Clicks" Daily Activity  Outcome Measure Difficulty turning over in bed (including adjusting bedclothes, sheets and blankets)?: Total Difficulty moving from lying on back to sitting on the side of the bed? : Total Difficulty  sitting down on and standing up from a chair with arms (e.g., wheelchair, bedside commode, etc,.)?: Total Help needed moving to and from a bed to chair (including a wheelchair)?: A Little Help needed walking in hospital room?: A Little Help needed climbing 3-5 steps with a railing? : A Lot 6 Click Score: 11    End of Session Equipment Utilized During Treatment: Gait belt;Back brace Activity Tolerance: Patient limited by pain Patient left: in chair;with call bell/phone within reach;with family/visitor present;with nursing/sitter in room Nurse Communication: Mobility status PT Visit Diagnosis: Other abnormalities of gait and mobility (R26.89);Pain Pain - part of body:  (back )    Time: 6415-8309 PT Time Calculation (min) (ACUTE ONLY): 20 min   Charges:   PT Evaluation $PT Eval Low Complexity: 1 Procedure PT Treatments $Gait Training: 8-22 mins   PT G Codes:        Leighton Ruff, PT, DPT  Acute Rehabilitation Services  Pager: [phone]   Rudean Hitt 09/09/2016, 3:07 PM

## 2016-09-09 NOTE — Anesthesia Procedure Notes (Signed)
Procedure Name: Intubation Date/Time: 09/09/2016 10:19 AM Performed by: Carney Living Pre-anesthesia Checklist: Patient identified, Emergency Drugs available, Suction available, Patient being monitored and Timeout performed Patient Re-evaluated:Patient Re-evaluated prior to inductionOxygen Delivery Method: Circle system utilized Preoxygenation: Pre-oxygenation with 100% oxygen Intubation Type: IV induction Ventilation: Mask ventilation without difficulty Laryngoscope Size: Mac and 4 Grade View: Grade I Tube type: Oral Tube size: 7.0 mm Number of attempts: 1 Airway Equipment and Method: Stylet Placement Confirmation: ETT inserted through vocal cords under direct vision,  positive ETCO2 and breath sounds checked- equal and bilateral Secured at: 20 cm Tube secured with: Tape Dental Injury: Teeth and Oropharynx as per pre-operative assessment

## 2016-09-09 NOTE — Progress Notes (Signed)
Patient ambulating without assistance to short stay from admissions and fell.   Per witnesses Control and instrumentation engineer and nurse tech), patient tripped over shoe and fell forward, landing on ribs.  Patient states that she tripped over shoe.  Patient able to move extremities after fall.  Patient assisted to stand and moved to stretcher.  Dr. Vertell Limber notified of patients fall and ordered CXR.  Patient states that she only has pain when touching/pressing on her rib cage.

## 2016-09-09 NOTE — Progress Notes (Signed)
   09/09/16 0809  What Happened  Was fall witnessed? Yes  Who witnessed fall? secretary  Patients activity before fall other (comment) (pt ambulating to short stay from admitting)  Point of contact other (comment) (right ribs)  Was patient injured? Unsure  Follow Up  MD notified Dr. Vertell Limber  Time MD notified [phone]  Family notified Yes-comment (family present on fall)  Time family notified [phone]  Additional tests Yes-comment (CXR)  Progress note created (see row info) Yes  Adult Fall Risk Assessment  Risk Factor Category (scoring not indicated) Fall has occurred during this admission (document High fall risk)  Patient's Fall Risk High Fall Risk (>13 points)  Adult Fall Risk Interventions  Required Bundle Interventions *See Row Information* High fall risk - low, moderate, and high requirements implemented  Screening for Fall Injury Risk  Risk For Fall Injury- See Row Information  None identified  Vitals  Temp 98.3 F (36.8 C)  Temp Source Oral  BP (!) 144/63  Pulse Rate 74  Pulse Rate Source Monitor  Resp 18  Oxygen Therapy  SpO2 100 %  O2 Device Room Air  Pain Assessment  Pain Assessment 0-10  Pain Score 3  Pain Type Acute pain  Pain Location Rib cage  Pain Orientation Right  Pain Descriptors / Indicators Sore  Pain Frequency Other (Comment) (when touched)  Pain Onset Other (Comment)  Patients Stated Pain Goal 2 (with touch)  Pain Intervention(s) RN made aware  Neurological  Neuro (WDL) WDL  Musculoskeletal  Musculoskeletal (WDL) X  Assistive Device None  Weight Bearing Restrictions No  Musculoskeletal Details  RLE Weakness  LLE Weakness  Integumentary  Integumentary (WDL) WDL  Pain Assessment  Result of Injury No

## 2016-09-09 NOTE — Op Note (Signed)
09/09/2016  12:04 PM  PATIENT:  Latoya Stevenson  76 y.o. female  PRE-OPERATIVE DIAGNOSIS:  Spinal stenosis of lumbar region with neurogenic claudication, scoliosis, spondylolisthesis, radiculopathy, lumbago L 23  POST-OPERATIVE DIAGNOSIS:  Spinal stenosis of lumbar region with neurogenic claudication, scoliosis, spondylolisthesis, radiculopathy, lumbago L 23  PROCEDURE:  Procedure(s) with comments: Left Lumbar two-three Anterolateral lumbar interbody fusion with lateral plate (Left) - Left F0-9 Anterolateral lumbar interbody fusion with lateral plate  SURGEON:  Surgeon(s) and Role:    Erline Levine, MD - Primary    Kristeen Miss, MD - Assisting  PHYSICIAN ASSISTANT:   ASSISTANTS: Poteat, RN   ANESTHESIA:   general  EBL:  Total I/O In: 1000 [I.V.:1000] Out: -   BLOOD ADMINISTERED:none  DRAINS: none   LOCAL MEDICATIONS USED:  MARCAINE    and LIDOCAINE   SPECIMEN:  No Specimen  DISPOSITION OF SPECIMEN:  N/A  COUNTS:  YES  TOURNIQUET:  * No tourniquets in log *  DICTATION: Patient is a 76 year old with severe spondylosis stenosis and scoliosis of the lumbar spine with spodylolisthesis. It was elected to take her to surgery for anterolateral decompression and lateral plate fixation at the L 23 level.  She had previously undergone fusion L 3 - L 5  levels.  Procedure: Patient was brought to the operating room and placed in a right lateral decubitus position on the operative table and using orthogonally projected C-arm fluoroscopy the patient was placed so that the L 23  level was visualized in AP and lateral plane. The patient was then taped into position. The table was flexed so as to expose the L 23 level. Skin was marked along with a posterior finger dissection incision. Her flank was then prepped and draped in usual sterile fashion and incisions were made overlying the 12 th rib. Posterior finger dissection was made to enter the retroperitoneal space and then subsequently  the probe was inserted into the psoas muscle from the right side initially at the L23 level. After mapping the neural elements were able to dock the probe per the midpoint of this vertebral level and without indications electrically of too close proximity to the neural tissues. Subsequently the self-retaining tractor was.after sequential dilators were utilized the shim was employed and the interspace was cleared of psoas muscle and then incised. A thorough discectomy was performed. Instruments were used to clear the interspace of disc material. After thorough discectomy was performed and this was performed using AP and lateral fluoroscopy a 10 lordotic by 50 x 22 mm x 10 degree implant was packed with extra extra small BMP and Attrax. This was tamped into position using the slides and its position was confirmed on AP and lateral fluoroscopy. A Decade lateral plate (size 10) was affixed to the lateral spine with 5.0 x 50 mm screws posteriorly and 5.5 x 50 mm bolts anteriorly. All screws were torque locked and positioning was confirmed with AP and lateral fluoroscopy. . Hemostasis was assured the wounds were irrigated interrupted Vicryl sutures.Sterile occlusive dressing was placed with Dermabond. The patient was then extubated in the operating room and taken to recovery in stable and satisfactory condition having tolerated his operation well. Counts were correct at the end of the case.  PLAN OF CARE: Admit to inpatient   PATIENT DISPOSITION:  PACU - hemodynamically stable.   Delay start of Pharmacological VTE agent (>24hrs) due to surgical blood loss or risk of bleeding: yes

## 2016-09-09 NOTE — Transfer of Care (Signed)
Immediate Anesthesia Transfer of Care Note  Patient: Latoya Stevenson  Procedure(s) Performed: Procedure(s) with comments: Left Lumbar two-three Anterolateral lumbar interbody fusion with lateral plate (Left) - Left E3-3 Anterolateral lumbar interbody fusion with lateral plate  Patient Location: PACU  Anesthesia Type:General  Level of Consciousness: awake, alert , oriented and patient cooperative  Airway & Oxygen Therapy: Patient Spontanous Breathing and Patient connected to nasal cannula oxygen  Post-op Assessment: Report given to RN, Post -op Vital signs reviewed and stable and Patient moving all extremities X 4  Post vital signs: Reviewed and stable  Last Vitals:  Vitals:   09/09/16 0809  BP: (!) 144/63  Pulse: 74  Resp: 18  Temp: 36.8 C    Last Pain:  Vitals:   09/09/16 1215  TempSrc:   PainSc: 7       Patients Stated Pain Goal: 2 (with touch) (29/51/88 4166)  Complications: No apparent anesthesia complications

## 2016-09-10 ENCOUNTER — Encounter (HOSPITAL_COMMUNITY): Payer: Self-pay | Admitting: Neurosurgery

## 2016-09-10 MED ORDER — METHOCARBAMOL 500 MG PO TABS: 500.0000 mg | ORAL_TABLET | Freq: Four times a day (QID) | ORAL | 1 refills | Status: DC | PRN

## 2016-09-10 NOTE — Progress Notes (Signed)
Physical Therapy Treatment Patient Details Name: Latoya Stevenson MRN: [id number] DOB: 1941-03-13 Today's Date: 09/10/2016    History of Present Illness Pt is 76 y/o female s/p L2-3 ALIF. PMH includes HTN, headaches, and previous back surgery.     PT Comments    Pt seen for mobility progression. Pt tolerated ambulating increased distance with decreased assistance this session. PT will continue to follow pt acutely to ensure a safe d/c home.   Follow Up Recommendations  No PT follow up;Supervision for mobility/OOB     Equipment Recommendations  Rolling walker with 5" wheels    Recommendations for Other Services       Precautions / Restrictions Precautions Precautions: Back Precaution Booklet Issued: No Precaution Comments: PT reviewed 3/3 back precautions with pt  Required Braces or Orthoses: Spinal Brace Spinal Brace: Lumbar corset;Applied in sitting position Restrictions Weight Bearing Restrictions: No    Mobility  Bed Mobility               General bed mobility comments: Pt OOB upon arrival  Transfers Overall transfer level: Needs assistance Equipment used: None Transfers: Sit to/from Stand Sit to Stand: Supervision         General transfer comment: for safety, no physical assist  Ambulation/Gait Ambulation/Gait assistance: Supervision Ambulation Distance (Feet): 300 Feet Assistive device: Rolling walker (2 wheeled) Gait Pattern/deviations: Step-through pattern;Decreased stride length;Trunk flexed Gait velocity: Decreased Gait velocity interpretation: Below normal speed for age/gender General Gait Details: no instability or LOB, supervision for safety with use of RW   Stairs            Wheelchair Mobility    Modified Rankin (Stroke Patients Only)       Balance Overall balance assessment: Needs assistance;History of Falls Sitting-balance support: Feet supported;No upper extremity supported Sitting balance-Leahy Scale: Good      Standing balance support: During functional activity;No upper extremity supported Standing balance-Leahy Scale: Fair                              Cognition Arousal/Alertness: Awake/alert Behavior During Therapy: WFL for tasks assessed/performed Overall Cognitive Status: Within Functional Limits for tasks assessed                                        Exercises      General Comments        Pertinent Vitals/Pain Pain Assessment: Faces Pain Score: 6  Faces Pain Scale: Hurts little more Pain Location: back Pain Descriptors / Indicators: Aching;Sore Pain Intervention(s): Monitored during session;Repositioned    Home Living Family/patient expects to be discharged to:: Private residence Living Arrangements: Alone Available Help at Discharge: Friend(s);Available PRN/intermittently Type of Home: House Home Access: Stairs to enter Entrance Stairs-Rails: Left;Right;Can reach both Home Layout: One level Home Equipment: Environmental consultant - 4 wheels;Cane - single point;Bedside commode;Shower seat;Grab bars - tub/shower      Prior Function Level of Independence: Independent          PT Goals (current goals can now be found in the care plan section) Acute Rehab PT Goals Patient Stated Goal: go home PT Goal Formulation: With patient Time For Goal Achievement: 09/16/16 Potential to Achieve Goals: Good Progress towards PT goals: Progressing toward goals    Frequency    Min 5X/week      PT Plan Current plan remains appropriate  Co-evaluation              AM-PAC PT "6 Clicks" Daily Activity  Outcome Measure  Difficulty turning over in bed (including adjusting bedclothes, sheets and blankets)?: A Little Difficulty moving from lying on back to sitting on the side of the bed? : A Little Difficulty sitting down on and standing up from a chair with arms (e.g., wheelchair, bedside commode, etc,.)?: A Little Help needed moving to and from a bed to  chair (including a wheelchair)?: None Help needed walking in hospital room?: None Help needed climbing 3-5 steps with a railing? : A Lot 6 Click Score: 19    End of Session Equipment Utilized During Treatment: Back brace Activity Tolerance: Patient tolerated treatment well Patient left: in chair;with call bell/phone within reach Nurse Communication: Mobility status PT Visit Diagnosis: Other abnormalities of gait and mobility (R26.89);Pain Pain - part of body:  (back)     Time: 6606-3016 PT Time Calculation (min) (ACUTE ONLY): 18 min  Charges:  $Gait Training: 8-22 mins                    G Codes:       [place], [place], [place] 09/10/2016, 10:32 AM

## 2016-09-10 NOTE — Progress Notes (Signed)
Pt received RW from [facility] prior to D/C. Holli Humbles, RN

## 2016-09-10 NOTE — Evaluation (Signed)
Occupational Therapy Evaluation and Discharge Patient Details Name: Latoya Stevenson MRN: [id number] DOB: 1940-07-26 Today's Date: 09/10/2016    History of Present Illness Pt is 76 y/o female s/p L2-3 ALIF. PMH includes HTN, headaches, and previous back surgery.    Clinical Impression   Pt reports she was independent with ADL PTA. Currently pt supervision with ADL and functional mobility. All back, safety, and ADL education completed with pt. Pt planning to d/c home with intermittent supervision from family. No further acute OT needs identified; signing off at this time. Please re-consult if needs change. Thank you for this referral.    Follow Up Recommendations  No OT follow up;Supervision - Intermittent    Equipment Recommendations  None recommended by OT    Recommendations for Other Services       Precautions / Restrictions Precautions Precautions: Back Precaution Booklet Issued: No Precaution Comments: Pt able to recall 3/3 back precautions Required Braces or Orthoses: Spinal Brace Spinal Brace: Lumbar corset;Applied in sitting position Restrictions Weight Bearing Restrictions: No      Mobility Bed Mobility               General bed mobility comments: Pt OOB upon arrival  Transfers Overall transfer level: Needs assistance Equipment used: Rolling walker (2 wheeled) Transfers: Sit to/from Stand Sit to Stand: Supervision         General transfer comment: for safety, no physical assist    Balance Overall balance assessment: Needs assistance;History of Falls Sitting-balance support: Feet supported;No upper extremity supported Sitting balance-Leahy Scale: Good     Standing balance support: Single extremity supported Standing balance-Leahy Scale: Fair                             ADL either performed or assessed with clinical judgement   ADL Overall ADL's : Needs assistance/impaired Eating/Feeding: Set up;Sitting   Grooming:  Supervision/safety;Standing Grooming Details (indicate cue type and reason): Educated on use of 2 cups for oral care Upper Body Bathing: Set up;Sitting   Lower Body Bathing: Supervison/ safety;Sit to/from stand   Upper Body Dressing : Set up;Sitting Upper Body Dressing Details (indicate cue type and reason): Pt reports no difficulties or questions with donning brace Lower Body Dressing: Supervision/safety;Sit to/from stand Lower Body Dressing Details (indicate cue type and reason): Pt able to cross foot over opposite knee. Educated on compensatory strategies for LB ADL. Toilet Transfer: Supervision/safety;Ambulation;BSC;RW Toilet Transfer Details (indicate cue type and reason): Simulated Toileting- Clothing Manipulation and Hygiene: Supervision/safety;Sit to/from stand Toileting - Clothing Manipulation Details (indicate cue type and reason): Educated on proper technique for peri care without twisting and use of wet wipes     Functional mobility during ADLs: Supervision/safety;Rolling walker General ADL Comments: Educated pt on maintaining back precuations during functional activites, keeping frequently used items at counter top height, frequent mobility throughout the day upon return home.     Vision         Perception     Praxis      Pertinent Vitals/Pain Pain Assessment: 0-10 Pain Score: 6  Pain Location: back Pain Descriptors / Indicators: Aching;Sore Pain Intervention(s): Monitored during session;Repositioned;Patient requesting pain meds-RN notified     Hand Dominance     Extremity/Trunk Assessment Upper Extremity Assessment Upper Extremity Assessment: Overall WFL for tasks assessed   Lower Extremity Assessment Lower Extremity Assessment: Defer to PT evaluation   Cervical / Trunk Assessment Cervical / Trunk Assessment: Other exceptions Cervical / Trunk  Exceptions: s/p spinal sx   Communication Communication Communication: No difficulties   Cognition  Arousal/Alertness: Awake/alert Behavior During Therapy: WFL for tasks assessed/performed Overall Cognitive Status: Within Functional Limits for tasks assessed                                     General Comments       Exercises     Shoulder Instructions      Home Living Family/patient expects to be discharged to:: Private residence Living Arrangements: Alone Available Help at Discharge: Friend(s);Available PRN/intermittently Type of Home: House Home Access: Stairs to enter [employer] of Steps: 2 Entrance Stairs-Rails: Left;Right;Can reach both Home Layout: One level     Bathroom Shower/Tub: Tub/shower unit;Curtain   Biochemist, clinical: Standard     Home Equipment: Environmental consultant - 4 wheels;Cane - single point;Bedside commode;Shower seat;Grab bars - tub/shower          Prior Functioning/Environment Level of Independence: Independent                 OT Problem List:        OT Treatment/Interventions:      OT Goals(Current goals can be found in the care plan section) Acute Rehab OT Goals Patient Stated Goal: go home OT Goal Formulation: All assessment and education complete, DC therapy  OT Frequency:     Barriers to D/C:            Co-evaluation              AM-PAC PT "6 Clicks" Daily Activity     Outcome Measure Help from another person eating meals?: None Help from another person taking care of personal grooming?: None Help from another person toileting, which includes using toliet, bedpan, or urinal?: A Little Help from another person bathing (including washing, rinsing, drying)?: A Little Help from another person to put on and taking off regular upper body clothing?: None Help from another person to put on and taking off regular lower body clothing?: A Little 6 Click Score: 21   End of Session Equipment Utilized During Treatment: Rolling walker Nurse Communication: Mobility status;Patient requests pain meds;Other (comment)  (no equipment or f/u needs)  Activity Tolerance: Patient tolerated treatment well Patient left: in chair;with call bell/phone within reach  OT Visit Diagnosis: Unsteadiness on feet (R26.81);Pain Pain - part of body:  (back)                Time: 5284-1324 OT Time Calculation (min): 14 min Charges:  OT General Charges $OT Visit: 1 Procedure OT Evaluation $OT Eval Moderate Complexity: 1 Procedure G-Codes:     Tamiyah Moulin A. Ulice Brilliant, M.S., OTR/L Pager: [place] 09/10/2016, 9:32 AM

## 2016-09-10 NOTE — Discharge Instructions (Signed)

## 2016-09-10 NOTE — Progress Notes (Addendum)
Subjective: Patient reports "I feel fine...haven't needed anything for pain in a long time"  Objective: Vital signs in last 24 hours: Temp:  [97.2 F (36.2 C)-98.3 F (36.8 C)] 98.3 F (36.8 C) (06/27 0434) Pulse Rate:  [55-75] 74 (06/27 0434) Resp:  [11-22] 18 (06/27 0434) BP: (117-151)/(63-87) 117/74 (06/27 0434) SpO2:  [96 %-100 %] 96 % (06/27 0434) Weight:  [79.8 kg (176 lb)] 79.8 kg (176 lb) (06/26 0809)  Intake/Output from previous day: 06/26 0701 - 06/27 0700 In: 2580 [P.O.:1280; I.V.:1300] Out: 1320 [Urine:1300; Blood:20] Intake/Output this shift: No intake/output data recorded.  Alert, conversant, reporting no pain at present. She noted some lumbar soreness overnight, but pre-op leg pain absent. Incisions without erythema, swelling, or drainage beneath honeycomb and Dermabond drsgs left side. Passing gas, but no BM yet. Reports no discomfort. Good strength BLE.   Lab Results:  Recent Labs  09/09/16 0836  WBC 5.8  HGB 14.0  HCT 43.2  PLT 150   BMET  Recent Labs  09/09/16 0836  NA 138  K 5.0  CL 107  CO2 20*  GLUCOSE 100*  BUN 19  CREATININE 1.26*  CALCIUM 9.2    Studies/Results: Dg Chest 2 View  Result Date: 09/09/2016 CLINICAL DATA:  Preop for lumbar spine surgery, fell today with right rib pain EXAM: CHEST  2 VIEW COMPARISON:  Chest x-ray of 11/11/2013 FINDINGS: No active infiltrate or effusion is seen. Mediastinal and hilar contours are unremarkable. The heart is within normal limits in size. No acute bony abnormality is seen. The area of pain overlies lower right ribs which are not well visualized on the frontal chest x-ray but no obvious fracture is seen. Hardware for fusion of the lower lumbar spine is noted. IMPRESSION: 1. No active cardiopulmonary disease. 2. No right rib fracture is evident on frontal chest x-ray. Electronically Signed   By: Ivar Drape M.D.   On: 09/09/2016 08:31   Dg Lumbar Spine 2-3 Views  Result Date: 09/09/2016 CLINICAL  DATA:  L2-3 lateral fusion EXAM: DG C-ARM 61-120 MIN; LUMBAR SPINE - 2-3 VIEW COMPARISON:  07/23/2016 FLUOROSCOPY TIME:  Fluoroscopy Time:  2 minutes 9 seconds Radiation Exposure Index (if provided by the fluoroscopic device): Not available Number of Acquired Spot Images: 2 FINDINGS: Interbody fusion is noted at L2-3 with fixation screws and lateral fixation. Changes of prior fusion at L3-4 and L4-5 are again seen. IMPRESSION: L2-3 lateral fusion Electronically Signed   By: Inez Catalina M.D.   On: 09/09/2016 12:01   Dg C-arm 1-60 Min  Result Date: 09/09/2016 CLINICAL DATA:  L2-3 lateral fusion EXAM: DG C-ARM 61-120 MIN; LUMBAR SPINE - 2-3 VIEW COMPARISON:  07/23/2016 FLUOROSCOPY TIME:  Fluoroscopy Time:  2 minutes 9 seconds Radiation Exposure Index (if provided by the fluoroscopic device): Not available Number of Acquired Spot Images: 2 FINDINGS: Interbody fusion is noted at L2-3 with fixation screws and lateral fixation. Changes of prior fusion at L3-4 and L4-5 are again seen. IMPRESSION: L2-3 lateral fusion Electronically Signed   By: Inez Catalina M.D.   On: 09/09/2016 12:01    Assessment/Plan: Improving  LOS: 1 day  Per DrStern, d/c IV, d/c to home. Pt verbalizes understanding of d/c instructions and has f/u appt scheduled. Rx for Robaxion will be eRx'ed from office. She already has sufficient Percocet 5/325 at home for post-op prn use.    Verdis Prime 09/10/2016, 7:53 AM  Patient is doing well.  Discharge home.

## 2016-09-10 NOTE — Discharge Summary (Signed)
Physician Discharge Summary  Patient ID: Latoya Stevenson MRN: [id number] DOB/AGE: 1940/10/06 76 y.o.  Admit date: 09/09/2016 Discharge date: 09/10/2016  Admission Diagnoses:Spinal stenosis of lumbar region with neurogenic claudication, scoliosis, spondylolisthesis, radiculopathy, lumbago L 23    Discharge Diagnoses: Spinal stenosis of lumbar region with neurogenic claudication, scoliosis, spondylolisthesis, radiculopathy, lumbago L 23 s/p Left Lumbar two-three Anterolateral lumbar interbody fusion with lateral plate (Left) - Left J2-4 Anterolateral lumbar interbody fusion with lateral plate   Active Problems:   Idiopathic scoliosis of lumbar region   Discharged Condition: good  Hospital Course: Latoya Stevenson was admitted for surgery with dx lumbar stenosis and radiculopathy. Following uncomplicated XLIF Q6-8 with lateral plate, she recovered well and transferred to [facility] for nursing care and therapies. She is mobilizing nicely with minimal pain.   Consults: None  Significant Diagnostic Studies: radiology: X-Ray: intra-op  Treatments: surgery: Left Lumbar two-three Anterolateral lumbar interbody fusion with lateral plate (Left) - Left T4-1 Anterolateral lumbar interbody fusion with lateral plate    Discharge Exam: Blood pressure 117/74, pulse 74, temperature 98.3 F (36.8 C), temperature source Oral, resp. rate 18, height 5' 3.5" (1.613 m), weight 79.8 kg (176 lb), SpO2 96 %. Alert, conversant, reporting no pain at present. She noted some lumbar soreness overnight, but pre-op leg pain absent. Incisions without erythema, swelling, or drainage beneath honeycomb and Dermabond drsgs left side. Passing gas, but no BM yet. Reports no discomfort. Good strength BLE.     Disposition: Discharge to home. Pt verbalizes understanding of d/c instructions and has f/u appt scheduled. Rx for Robaxion will be eRx'ed from office. She already has sufficient Percocet 5/325 at home for  post-op prn use.      Discharge Instructions    Diet - low sodium heart healthy    Complete by:  As directed    Increase activity slowly    Complete by:  As directed      Allergies as of 09/10/2016      Reactions   Celebrex [celecoxib] Swelling   SWELLING REACTION UNSPECIFIED    Flagyl [metronidazole Hcl] Swelling   SWELLING REACTION UNSPECIFIED    Macrodantin [nitrofurantoin Macrocrystal] Swelling   SWELLING REACTION UNSPECIFIED    Penicillins Swelling   PATIENT HAD A PCN REACTION WITH IMMEDIATE RASH, FACIAL/TONGUE/THROAT SWELLING, SOB, OR LIGHTHEADEDNESS WITH HYPOTENSION:  #  #  #  YES  #  #  #   Has patient had a PCN reaction causing severe rash involving mucus membranes or skin necrosis: No Has patient had a PCN reaction that required hospitalization: No Has patient had a PCN reaction occurring within the last 10 years: No If all of the above answers are "NO", then may proceed with Cephalosporin use.      Medication List    STOP taking these medications   diclofenac 50 MG tablet Commonly known as:  CATAFLAM     TAKE these medications   amLODipine 5 MG tablet Commonly known as:  NORVASC Take 5 mg by mouth at bedtime.   atorvastatin 40 MG tablet Commonly known as:  LIPITOR Take 40 mg by mouth at bedtime.   CALCIUM 600 + D PO Take 2 tablets by mouth daily.   diphenhydrAMINE 25 MG tablet Commonly known as:  SOMINEX Take 25 mg by mouth at bedtime. For sleep   doxylamine (Sleep) 25 MG tablet Commonly known as:  UNISOM Take 25 mg by mouth at bedtime.   fexofenadine 180 MG tablet Commonly known as:  ALLEGRA Take 180 mg  by mouth daily.   fluticasone 50 MCG/ACT nasal spray Commonly known as:  FLONASE Place 2 sprays into the nose at bedtime.   Glucosamine Chondroitin Triple Tabs Take 1 tablet by mouth daily.   irbesartan 300 MG tablet Commonly known as:  AVAPRO Take 300 mg by mouth daily.   levothyroxine 150 MCG tablet Commonly known as:  SYNTHROID,  LEVOTHROID Take 150 mcg by mouth daily before breakfast.   meclizine 12.5 MG tablet Commonly known as:  ANTIVERT Take 12.5 mg by mouth 3 (three) times daily as needed for dizziness.   methocarbamol 500 MG tablet Commonly known as:  ROBAXIN Take 500 mg by mouth as needed for muscle spasms. What changed:  Another medication with the same name was added. Make sure you understand how and when to take each.   methocarbamol 500 MG tablet Commonly known as:  ROBAXIN Take 1 tablet (500 mg total) by mouth every 6 (six) hours as needed for muscle spasms. What changed:  You were already taking a medication with the same name, and this prescription was added. Make sure you understand how and when to take each.   multivitamin with minerals Tabs tablet Take 1 tablet by mouth daily.   omeprazole 20 MG capsule Commonly known as:  PRILOSEC Take 20 mg by mouth daily.   oxyCODONE-acetaminophen 5-325 MG tablet Commonly known as:  PERCOCET/ROXICET Take 1 tablet by mouth 2 (two) times daily as needed for pain.   Vitamin D-3 5000 units Tabs Take 5,000 Units by mouth at bedtime.   vitamin E 400 UNIT capsule Take 400 Units by mouth daily.        Signed: Peggyann Shoals, MD 09/10/2016, 8:40 AM

## 2016-09-10 NOTE — Progress Notes (Signed)
Pt doing well. Pt and family given D/C instructions with Rx, verbal understanding was provided. Pt's incision is clean and dry with no sign of infection. Pt's IV was removed prior to D/C. Pt D/C'd home via wheelchair per MD order. Pt is stable @ D/C and has no other needs at this time. Yerlin Gasparyan, RN  

## 2016-11-20 ENCOUNTER — Other Ambulatory Visit: Payer: Self-pay | Admitting: Neurosurgery

## 2016-11-20 ENCOUNTER — Other Ambulatory Visit: Payer: Self-pay | Admitting: Internal Medicine

## 2016-11-20 DIAGNOSIS — Z1231 Encounter for screening mammogram for malignant neoplasm of breast: Secondary | ICD-10-CM

## 2016-11-26 NOTE — Pre-Procedure Instructions (Signed)
Latoya Stevenson  11/26/2016      [place], [address] [place] [address] Phone: [phone] Fax: [phone]    Your procedure is scheduled on September 18  Report to [place] at 1014 A.M.  Call this number if you have problems the morning of surgery:  [phone]   Remember:  Do not eat food or drink liquids after midnight.  Continue all other medications as directed by your physician except follow these medication instructions before surgery   Take these medicines the morning of surgery with A SIP OF WATER  levothyroxine (SYNTHROID, LEVOTHROID)  meclizine (ANTIVERT)  methocarbamol (ROBAXIN)  omeprazole (PRILOSEC) oxyCODONE-acetaminophen (PERCOCET/ROXICET)   Do not wear jewelry, make-up or nail polish.  Do not wear lotions, powders, or perfumes, or deoderant.  Do not shave 48 hours prior to surgery.  Men may shave face and neck.  Do not bring valuables to the hospital.  [facility] is not responsible for any belongings or valuables.  Contacts, dentures or bridgework may not be worn into surgery.  Leave your suitcase in the car.  After surgery it may be brought to your room.  For patients admitted to the hospital, discharge time will be determined by your treatment team.  Patients discharged the day of surgery will not be allowed to drive home.    Special instructions:   [place]- Preparing For Surgery  Before surgery, you can play an important role. Because skin is not sterile, your skin needs to be as free of germs as possible. You can reduce the number of germs on your skin by washing with CHG (chlorahexidine gluconate) Soap before surgery.  CHG is an antiseptic cleaner which kills germs and bonds with the skin to continue killing germs even after washing.  Please do not use if you have an allergy to CHG or antibacterial soaps. If your skin becomes reddened/irritated stop using the  CHG.  Do not shave (including legs and underarms) for at least 48 hours prior to first CHG shower. It is OK to shave your face.  Please follow these instructions carefully.   1. Shower the NIGHT BEFORE SURGERY and the MORNING OF SURGERY with CHG.   2. If you chose to wash your hair, wash your hair first as usual with your normal shampoo.  3. After you shampoo, rinse your hair and body thoroughly to remove the shampoo.  4. Use CHG as you would any other liquid soap. You can apply CHG directly to the skin and wash gently with a scrungie or a clean washcloth.   5. Apply the CHG Soap to your body ONLY FROM THE NECK DOWN.  Do not use on open wounds or open sores. Avoid contact with your eyes, ears, mouth and genitals (private parts). Wash genitals (private parts) with your normal soap.  6. Wash thoroughly, paying special attention to the area where your surgery will be performed.  7. Thoroughly rinse your body with warm water from the neck down.  8. DO NOT shower/wash with your normal soap after using and rinsing off the CHG Soap.  9. Pat yourself dry with a CLEAN TOWEL.   10. Wear CLEAN PAJAMAS   11. Place CLEAN SHEETS on your bed the night of your first shower and DO NOT SLEEP WITH PETS.    Day of Surgery: Do not apply any deodorants/lotions. Please wear clean clothes to the hospital/surgery center.  Please read over the following fact sheets that you were given.

## 2016-11-27 ENCOUNTER — Encounter (HOSPITAL_COMMUNITY): Payer: Self-pay

## 2016-11-27 ENCOUNTER — Encounter (HOSPITAL_COMMUNITY)
Admission: RE | Admit: 2016-11-27 | Discharge: 2016-11-27 | Disposition: A | Discharge status: Home / Self Care | Payer: Medicare Other | Source: Ambulatory Visit | Attending: Neurosurgery | Admitting: Neurosurgery

## 2016-11-27 ENCOUNTER — Other Ambulatory Visit: Payer: Self-pay | Admitting: Neurosurgery

## 2016-11-27 DIAGNOSIS — Z01812 Encounter for preprocedural laboratory examination: Secondary | ICD-10-CM | POA: Insufficient documentation

## 2016-11-27 LAB — BASIC METABOLIC PANEL
Anion gap: 7 (ref 5–15)
BUN: 18 mg/dL (ref 6–20)
CO2: 23 mmol/L (ref 22–32)
Calcium: 9 mg/dL (ref 8.9–10.3)
Chloride: 106 mmol/L (ref 101–111)
Creatinine, Ser: 0.9 mg/dL (ref 0.44–1.00)
GFR calc Af Amer: >60 mL/min (ref >60)
GFR calc non Af Amer: >60 mL/min (ref >60)
Glucose, Bld: 103 mg/dL — ABNORMAL HIGH (ref 65–99)
Potassium: 4 mmol/L (ref 3.5–5.1)
Sodium: 136 mmol/L (ref 135–145)

## 2016-11-27 LAB — CBC
HCT: 35.6 % — ABNORMAL LOW (ref 36.0–46.0)
Hemoglobin: 11.1 g/dL — ABNORMAL LOW (ref 12.0–15.0)
MCH: 27.1 pg (ref 26.0–34.0)
MCHC: 31.2 g/dL (ref 30.0–36.0)
MCV: 86.8 fL (ref 78.0–100.0)
Platelets: 224 10*3/uL (ref 150–400)
RBC: 4.1 MIL/uL (ref 3.87–5.11)
RDW: 15.3 % (ref 11.5–15.5)
WBC: 7.2 10*3/uL (ref 4.0–10.5)

## 2016-11-27 LAB — TYPE AND SCREEN
ABO/RH(D): A POS
Antibody Screen: NEGATIVE

## 2016-11-27 LAB — SURGICAL PCR SCREEN
MRSA, PCR: NEGATIVE
Staphylococcus aureus: NEGATIVE

## 2016-11-27 NOTE — Progress Notes (Signed)
PCP - Dr. Candiss Norse Cardiologist - Dr. Ubaldo Glassing  Chest x-ray - 09/06/2016 EKG - 09/09/2016 Stress Test - 2010 ECHO - 2010 Cardiac Cath - patient denies    Patient denies shortness of breath, fever, cough and chest pain at PAT appointment   Patient verbalized understanding of instructions that were given to them at the PAT appointment. Patient was also instructed that they will need to review over the PAT instructions again at home before surgery.

## 2016-12-01 MED ORDER — VANCOMYCIN HCL IN DEXTROSE 1-5 GM/200ML-% IV SOLN
1000.0000 mg | INTRAVENOUS | Status: AC
  Administered 2016-12-02: 1000 mg via INTRAVENOUS
  Filled 2016-12-01: qty 200

## 2016-12-02 ENCOUNTER — Inpatient Hospital Stay (HOSPITAL_COMMUNITY): Payer: Medicare Other | Admitting: Certified Registered"

## 2016-12-02 ENCOUNTER — Inpatient Hospital Stay (HOSPITAL_COMMUNITY)
Admission: RE | Admit: 2016-12-02 | Discharge: 2016-12-05 | DRG: 457 | Disposition: A | Discharge status: Skilled Nursing Facility | Payer: Medicare Other | Source: Ambulatory Visit | Attending: Neurosurgery | Admitting: Neurosurgery

## 2016-12-02 ENCOUNTER — Encounter (HOSPITAL_COMMUNITY)
Admission: RE | Disposition: A | Discharge status: Skilled Nursing Facility | Payer: Self-pay | Source: Ambulatory Visit | Attending: Neurosurgery

## 2016-12-02 ENCOUNTER — Inpatient Hospital Stay (HOSPITAL_COMMUNITY): Payer: Medicare Other

## 2016-12-02 DIAGNOSIS — M810 Age-related osteoporosis without current pathological fracture: Secondary | ICD-10-CM | POA: Diagnosis present

## 2016-12-02 DIAGNOSIS — M545 Low back pain: Secondary | ICD-10-CM | POA: Diagnosis present

## 2016-12-02 DIAGNOSIS — Z981 Arthrodesis status: Secondary | ICD-10-CM | POA: Diagnosis not present

## 2016-12-02 DIAGNOSIS — K219 Gastro-esophageal reflux disease without esophagitis: Secondary | ICD-10-CM | POA: Diagnosis present

## 2016-12-02 DIAGNOSIS — E039 Hypothyroidism, unspecified: Secondary | ICD-10-CM | POA: Diagnosis present

## 2016-12-02 DIAGNOSIS — Z791 Long term (current) use of non-steroidal anti-inflammatories (NSAID): Secondary | ICD-10-CM | POA: Diagnosis not present

## 2016-12-02 DIAGNOSIS — M4856XA Collapsed vertebra, not elsewhere classified, lumbar region, initial encounter for fracture: Secondary | ICD-10-CM | POA: Diagnosis present

## 2016-12-02 DIAGNOSIS — I10 Essential (primary) hypertension: Secondary | ICD-10-CM | POA: Diagnosis present

## 2016-12-02 DIAGNOSIS — Z419 Encounter for procedure for purposes other than remedying health state, unspecified: Secondary | ICD-10-CM

## 2016-12-02 DIAGNOSIS — S32000A Wedge compression fracture of unspecified lumbar vertebra, initial encounter for closed fracture: Secondary | ICD-10-CM | POA: Diagnosis present

## 2016-12-02 DIAGNOSIS — M4126 Other idiopathic scoliosis, lumbar region: Principal | ICD-10-CM | POA: Diagnosis present

## 2016-12-02 HISTORY — DX: Wedge compression fracture of unspecified lumbar vertebra, initial encounter for closed fracture: S32.000A

## 2016-12-02 SURGERY — POSTERIOR LUMBAR FUSION 2 LEVEL
Anesthesia: General | Site: Back

## 2016-12-02 MED ORDER — ADULT MULTIVITAMIN W/MINERALS CH
1.0000 | ORAL_TABLET | Freq: Every day | ORAL | Status: DC
  Administered 2016-12-02 – 2016-12-05 (×4): 1 via ORAL
  Filled 2016-12-02 (×4): qty 1

## 2016-12-02 MED ORDER — ROCURONIUM BROMIDE 100 MG/10ML IV SOLN
INTRAVENOUS | Status: DC | PRN
  Administered 2016-12-02: 30 mg via INTRAVENOUS
  Administered 2016-12-02: 50 mg via INTRAVENOUS

## 2016-12-02 MED ORDER — ACETAMINOPHEN 325 MG PO TABS
650.0000 mg | ORAL_TABLET | ORAL | Status: DC | PRN
  Administered 2016-12-04 – 2016-12-05 (×4): 650 mg via ORAL
  Filled 2016-12-02 (×5): qty 2

## 2016-12-02 MED ORDER — LIDOCAINE-EPINEPHRINE 1 %-1:100000 IJ SOLN
INTRAMUSCULAR | Status: AC
  Filled 2016-12-02: qty 1

## 2016-12-02 MED ORDER — DOCUSATE SODIUM 100 MG PO CAPS
100.0000 mg | ORAL_CAPSULE | Freq: Two times a day (BID) | ORAL | Status: DC
  Administered 2016-12-02 – 2016-12-05 (×7): 100 mg via ORAL
  Filled 2016-12-02 (×7): qty 1

## 2016-12-02 MED ORDER — ZOLPIDEM TARTRATE 5 MG PO TABS
5.0000 mg | ORAL_TABLET | Freq: Every evening | ORAL | Status: DC | PRN
  Filled 2016-12-02: qty 1

## 2016-12-02 MED ORDER — ATORVASTATIN CALCIUM 20 MG PO TABS
40.0000 mg | ORAL_TABLET | Freq: Every day | ORAL | Status: DC
  Administered 2016-12-02 – 2016-12-04 (×3): 40 mg via ORAL
  Filled 2016-12-02 (×3): qty 2

## 2016-12-02 MED ORDER — DOXYLAMINE SUCCINATE (SLEEP) 25 MG PO TABS
25.0000 mg | ORAL_TABLET | Freq: Every day | ORAL | Status: DC
  Filled 2016-12-02: qty 1

## 2016-12-02 MED ORDER — SODIUM CHLORIDE 0.9% FLUSH: 3.0000 mL | INTRAVENOUS | Status: DC | PRN

## 2016-12-02 MED ORDER — MENTHOL 3 MG MT LOZG: 1.0000 | LOZENGE | OROMUCOSAL | Status: DC | PRN

## 2016-12-02 MED ORDER — CHLORHEXIDINE GLUCONATE CLOTH 2 % EX PADS: 6.0000 | MEDICATED_PAD | Freq: Once | CUTANEOUS | Status: DC

## 2016-12-02 MED ORDER — THROMBIN 20000 UNITS EX SOLR
CUTANEOUS | Status: DC | PRN
  Administered 2016-12-02: 12:00:00 via TOPICAL

## 2016-12-02 MED ORDER — MORPHINE SULFATE (PF) 4 MG/ML IV SOLN
2.0000 mg | INTRAVENOUS | Status: DC | PRN
  Administered 2016-12-02: 2 mg via INTRAVENOUS
  Filled 2016-12-02: qty 1

## 2016-12-02 MED ORDER — ACETAMINOPHEN 650 MG RE SUPP: 650.0000 mg | RECTAL | Status: DC | PRN

## 2016-12-02 MED ORDER — OXYCODONE HCL 5 MG PO TABS
5.0000 mg | ORAL_TABLET | ORAL | Status: DC | PRN
  Administered 2016-12-02 (×2): 10 mg via ORAL
  Administered 2016-12-03: 5 mg via ORAL
  Administered 2016-12-03: 10 mg via ORAL
  Administered 2016-12-03 (×2): 5 mg via ORAL
  Administered 2016-12-04 (×2): 10 mg via ORAL
  Administered 2016-12-04: 5 mg via ORAL
  Administered 2016-12-04 – 2016-12-05 (×4): 10 mg via ORAL
  Filled 2016-12-02: qty 1
  Filled 2016-12-02: qty 2
  Filled 2016-12-02 (×2): qty 1
  Filled 2016-12-02 (×6): qty 2
  Filled 2016-12-02: qty 1
  Filled 2016-12-02 (×2): qty 2

## 2016-12-02 MED ORDER — PROPOFOL 10 MG/ML IV BOLUS
INTRAVENOUS | Status: DC | PRN
  Administered 2016-12-02: 20 mg via INTRAVENOUS
  Administered 2016-12-02: 130 mg via INTRAVENOUS

## 2016-12-02 MED ORDER — THROMBIN 20000 UNITS EX SOLR
CUTANEOUS | Status: AC
  Filled 2016-12-02: qty 20000

## 2016-12-02 MED ORDER — LEVOTHYROXINE SODIUM 100 MCG PO TABS
150.0000 ug | ORAL_TABLET | Freq: Every day | ORAL | Status: DC
  Administered 2016-12-03 – 2016-12-05 (×3): 150 ug via ORAL
  Filled 2016-12-02 (×3): qty 2

## 2016-12-02 MED ORDER — DEXAMETHASONE SODIUM PHOSPHATE 10 MG/ML IJ SOLN
INTRAMUSCULAR | Status: DC | PRN
  Administered 2016-12-02: 10 mg via INTRAVENOUS

## 2016-12-02 MED ORDER — SODIUM CHLORIDE 0.9% FLUSH: 3.0000 mL | Freq: Two times a day (BID) | INTRAVENOUS | Status: DC

## 2016-12-02 MED ORDER — FLUTICASONE PROPIONATE 50 MCG/ACT NA SUSP
2.0000 | Freq: Every day | NASAL | Status: DC
  Administered 2016-12-02 – 2016-12-03 (×2): 2 via NASAL
  Filled 2016-12-02: qty 16

## 2016-12-02 MED ORDER — BUPIVACAINE LIPOSOME 1.3 % IJ SUSP
20.0000 mL | INTRAMUSCULAR | Status: AC
  Administered 2016-12-02: 20 mL
  Filled 2016-12-02: qty 20

## 2016-12-02 MED ORDER — OXYCODONE HCL 5 MG PO TABS: 5.0000 mg | ORAL_TABLET | Freq: Once | ORAL | Status: DC | PRN

## 2016-12-02 MED ORDER — LACTATED RINGERS IV SOLN
INTRAVENOUS | Status: DC
  Administered 2016-12-02 (×2): via INTRAVENOUS

## 2016-12-02 MED ORDER — SENNOSIDES-DOCUSATE SODIUM 8.6-50 MG PO TABS: 1.0000 | ORAL_TABLET | Freq: Every evening | ORAL | Status: DC | PRN

## 2016-12-02 MED ORDER — VANCOMYCIN HCL IN DEXTROSE 1-5 GM/200ML-% IV SOLN
1000.0000 mg | Freq: Once | INTRAVENOUS | Status: AC
  Administered 2016-12-02: 1000 mg via INTRAVENOUS
  Filled 2016-12-02 (×2): qty 200

## 2016-12-02 MED ORDER — SUGAMMADEX SODIUM 200 MG/2ML IV SOLN
INTRAVENOUS | Status: DC | PRN
  Administered 2016-12-02: 200 mg via INTRAVENOUS

## 2016-12-02 MED ORDER — PHENYLEPHRINE 40 MCG/ML (10ML) SYRINGE FOR IV PUSH (FOR BLOOD PRESSURE SUPPORT)
PREFILLED_SYRINGE | INTRAVENOUS | Status: AC
  Filled 2016-12-02: qty 10

## 2016-12-02 MED ORDER — GLUCOSAMINE CHONDROITIN TRIPLE PO TABS: 1.0000 | ORAL_TABLET | Freq: Two times a day (BID) | ORAL | Status: DC

## 2016-12-02 MED ORDER — ONDANSETRON HCL 4 MG/2ML IJ SOLN
INTRAMUSCULAR | Status: DC | PRN
  Administered 2016-12-02: 4 mg via INTRAVENOUS

## 2016-12-02 MED ORDER — ACETAMINOPHEN 325 MG PO TABS: 325.0000 mg | ORAL_TABLET | ORAL | Status: DC | PRN

## 2016-12-02 MED ORDER — DEXAMETHASONE SODIUM PHOSPHATE 10 MG/ML IJ SOLN
INTRAMUSCULAR | Status: AC
  Filled 2016-12-02: qty 3

## 2016-12-02 MED ORDER — BISACODYL 10 MG RE SUPP: 10.0000 mg | Freq: Every day | RECTAL | Status: DC | PRN

## 2016-12-02 MED ORDER — LIDOCAINE HCL (CARDIAC) 20 MG/ML IV SOLN
INTRAVENOUS | Status: DC | PRN
  Administered 2016-12-02: 60 mg via INTRAVENOUS

## 2016-12-02 MED ORDER — PROPOFOL 10 MG/ML IV BOLUS
INTRAVENOUS | Status: AC
  Filled 2016-12-02: qty 20

## 2016-12-02 MED ORDER — DIPHENHYDRAMINE HCL 25 MG PO CAPS
25.0000 mg | ORAL_CAPSULE | Freq: Every day | ORAL | Status: DC
  Administered 2016-12-02 – 2016-12-04 (×3): 25 mg via ORAL
  Filled 2016-12-02 (×3): qty 1

## 2016-12-02 MED ORDER — ONDANSETRON HCL 4 MG PO TABS: 4.0000 mg | ORAL_TABLET | Freq: Four times a day (QID) | ORAL | Status: DC | PRN

## 2016-12-02 MED ORDER — THROMBIN 5000 UNITS EX SOLR
CUTANEOUS | Status: AC
  Filled 2016-12-02: qty 5000

## 2016-12-02 MED ORDER — AMLODIPINE BESYLATE 5 MG PO TABS
5.0000 mg | ORAL_TABLET | Freq: Every day | ORAL | Status: DC
  Administered 2016-12-03 – 2016-12-04 (×2): 5 mg via ORAL
  Filled 2016-12-02 (×2): qty 1

## 2016-12-02 MED ORDER — IRBESARTAN 300 MG PO TABS
300.0000 mg | ORAL_TABLET | Freq: Every day | ORAL | Status: DC
  Administered 2016-12-03 – 2016-12-05 (×3): 300 mg via ORAL
  Filled 2016-12-02 (×3): qty 1

## 2016-12-02 MED ORDER — PHENYLEPHRINE HCL 10 MG/ML IJ SOLN
INTRAVENOUS | Status: DC | PRN
  Administered 2016-12-02: 30 ug/min via INTRAVENOUS

## 2016-12-02 MED ORDER — METHOCARBAMOL 1000 MG/10ML IJ SOLN
500.0000 mg | Freq: Four times a day (QID) | INTRAVENOUS | Status: DC | PRN
  Filled 2016-12-02: qty 5

## 2016-12-02 MED ORDER — FENTANYL CITRATE (PF) 100 MCG/2ML IJ SOLN
INTRAMUSCULAR | Status: AC
  Filled 2016-12-02: qty 2

## 2016-12-02 MED ORDER — OXYCODONE HCL 5 MG/5ML PO SOLN: 5.0000 mg | Freq: Once | ORAL | Status: DC | PRN

## 2016-12-02 MED ORDER — FENTANYL CITRATE (PF) 100 MCG/2ML IJ SOLN
25.0000 ug | INTRAMUSCULAR | Status: DC | PRN
  Administered 2016-12-02 (×2): 50 ug via INTRAVENOUS

## 2016-12-02 MED ORDER — LIDOCAINE 2% (20 MG/ML) 5 ML SYRINGE
INTRAMUSCULAR | Status: AC
  Filled 2016-12-02: qty 5

## 2016-12-02 MED ORDER — PHENYLEPHRINE HCL 10 MG/ML IJ SOLN
INTRAMUSCULAR | Status: DC | PRN
  Administered 2016-12-02: 120 ug via INTRAVENOUS
  Administered 2016-12-02: 160 ug via INTRAVENOUS

## 2016-12-02 MED ORDER — VITAMIN E 180 MG (400 UNIT) PO CAPS
400.0000 [IU] | ORAL_CAPSULE | Freq: Every day | ORAL | Status: DC
  Administered 2016-12-02 – 2016-12-05 (×4): 400 [IU] via ORAL
  Filled 2016-12-02 (×4): qty 1

## 2016-12-02 MED ORDER — SODIUM CHLORIDE 0.9 % IV SOLN: 250.0000 mL | INTRAVENOUS | Status: DC

## 2016-12-02 MED ORDER — METHOCARBAMOL 500 MG PO TABS
500.0000 mg | ORAL_TABLET | Freq: Four times a day (QID) | ORAL | Status: DC | PRN
  Administered 2016-12-02 – 2016-12-05 (×8): 500 mg via ORAL
  Filled 2016-12-02 (×8): qty 1

## 2016-12-02 MED ORDER — LIDOCAINE-EPINEPHRINE 1 %-1:100000 IJ SOLN
INTRAMUSCULAR | Status: DC | PRN
  Administered 2016-12-02: 10 mL

## 2016-12-02 MED ORDER — METHOCARBAMOL 500 MG PO TABS: 500.0000 mg | ORAL_TABLET | Freq: Four times a day (QID) | ORAL | Status: DC | PRN

## 2016-12-02 MED ORDER — PANTOPRAZOLE SODIUM 40 MG IV SOLR: 40.0000 mg | Freq: Every day | INTRAVENOUS | Status: DC

## 2016-12-02 MED ORDER — PANTOPRAZOLE SODIUM 40 MG PO TBEC
40.0000 mg | DELAYED_RELEASE_TABLET | Freq: Every day | ORAL | Status: DC
  Administered 2016-12-03 – 2016-12-05 (×3): 40 mg via ORAL
  Filled 2016-12-02 (×3): qty 1

## 2016-12-02 MED ORDER — ACETAMINOPHEN 160 MG/5ML PO SOLN: 325.0000 mg | ORAL | Status: DC | PRN

## 2016-12-02 MED ORDER — MECLIZINE HCL 12.5 MG PO TABS: 12.5000 mg | ORAL_TABLET | Freq: Three times a day (TID) | ORAL | Status: DC | PRN

## 2016-12-02 MED ORDER — KCL IN DEXTROSE-NACL 20-5-0.45 MEQ/L-%-% IV SOLN: INTRAVENOUS | Status: DC

## 2016-12-02 MED ORDER — 0.9 % SODIUM CHLORIDE (POUR BTL) OPTIME
TOPICAL | Status: DC | PRN
  Administered 2016-12-02: 1000 mL

## 2016-12-02 MED ORDER — FENTANYL CITRATE (PF) 250 MCG/5ML IJ SOLN
INTRAMUSCULAR | Status: AC
  Filled 2016-12-02: qty 5

## 2016-12-02 MED ORDER — THROMBIN 5000 UNITS EX SOLR
OROMUCOSAL | Status: DC | PRN
  Administered 2016-12-02: 13:00:00 via TOPICAL

## 2016-12-02 MED ORDER — ONDANSETRON HCL 4 MG/2ML IJ SOLN
INTRAMUSCULAR | Status: AC
  Filled 2016-12-02: qty 2

## 2016-12-02 MED ORDER — SUGAMMADEX SODIUM 200 MG/2ML IV SOLN
INTRAVENOUS | Status: AC
  Filled 2016-12-02: qty 2

## 2016-12-02 MED ORDER — FLEET ENEMA 7-19 GM/118ML RE ENEM
1.0000 | ENEMA | Freq: Once | RECTAL | Status: DC | PRN
Start: 2016-12-02 — End: 2016-12-05

## 2016-12-02 MED ORDER — ONDANSETRON HCL 4 MG/2ML IJ SOLN: 4.0000 mg | Freq: Four times a day (QID) | INTRAMUSCULAR | Status: DC | PRN

## 2016-12-02 MED ORDER — ROCURONIUM BROMIDE 10 MG/ML (PF) SYRINGE
PREFILLED_SYRINGE | INTRAVENOUS | Status: AC
  Filled 2016-12-02: qty 5

## 2016-12-02 MED ORDER — LORATADINE 10 MG PO TABS
10.0000 mg | ORAL_TABLET | Freq: Every day | ORAL | Status: DC
  Administered 2016-12-02 – 2016-12-05 (×4): 10 mg via ORAL
  Filled 2016-12-02 (×4): qty 1

## 2016-12-02 MED ORDER — IBUPROFEN 200 MG PO TABS: 400.0000 mg | ORAL_TABLET | Freq: Three times a day (TID) | ORAL | Status: DC | PRN

## 2016-12-02 MED ORDER — ALUM & MAG HYDROXIDE-SIMETH 200-200-20 MG/5ML PO SUSP: 30.0000 mL | Freq: Four times a day (QID) | ORAL | Status: DC | PRN

## 2016-12-02 MED ORDER — PHENOL 1.4 % MT LIQD: 1.0000 | OROMUCOSAL | Status: DC | PRN

## 2016-12-02 MED ORDER — BUPIVACAINE HCL (PF) 0.5 % IJ SOLN
INTRAMUSCULAR | Status: DC | PRN
  Administered 2016-12-02: 10 mL

## 2016-12-02 MED ORDER — OXYCODONE-ACETAMINOPHEN 5-325 MG PO TABS
1.0000 | ORAL_TABLET | ORAL | Status: DC | PRN
  Administered 2016-12-03 – 2016-12-04 (×4): 1 via ORAL
  Filled 2016-12-02 (×4): qty 1

## 2016-12-02 MED ORDER — BUPIVACAINE HCL (PF) 0.5 % IJ SOLN
INTRAMUSCULAR | Status: AC
  Filled 2016-12-02: qty 30

## 2016-12-02 MED ORDER — VITAMIN D 1000 UNITS PO TABS
5000.0000 [IU] | ORAL_TABLET | Freq: Every day | ORAL | Status: DC
  Administered 2016-12-02 – 2016-12-05 (×4): 5000 [IU] via ORAL
  Filled 2016-12-02 (×4): qty 5

## 2016-12-02 MED ORDER — FENTANYL CITRATE (PF) 100 MCG/2ML IJ SOLN
INTRAMUSCULAR | Status: DC | PRN
  Administered 2016-12-02 (×3): 50 ug via INTRAVENOUS
  Administered 2016-12-02: 100 ug via INTRAVENOUS

## 2016-12-02 MED FILL — Sodium Chloride IV Soln 0.9%: INTRAVENOUS | Qty: 1000 | Status: AC

## 2016-12-02 MED FILL — Heparin Sodium (Porcine) Inj 1000 Unit/ML: INTRAMUSCULAR | Qty: 30 | Status: AC

## 2016-12-02 SURGICAL SUPPLY — 72 items
BASKET BONE COLLECTION (BASKET) ×3 IMPLANT
BLADE CLIPPER SURG (BLADE) IMPLANT
BONE CANC CHIPS 40CC CAN1/2 (Bone Implant) ×3 IMPLANT
BUR MATCHSTICK NEURO 3.0 LAGG (BURR) ×3 IMPLANT
BUR PRECISION FLUTE 5.0 (BURR) ×3 IMPLANT
CANISTER SUCT 3000ML PPV (MISCELLANEOUS) ×3 IMPLANT
CARTRIDGE OIL MAESTRO DRILL (MISCELLANEOUS) ×1 IMPLANT
CHIPS CANC BONE 40CC CAN1/2 (Bone Implant) ×1 IMPLANT
CONT SPEC 4OZ CLIKSEAL STRL BL (MISCELLANEOUS) ×3 IMPLANT
COVER BACK TABLE 60X90IN (DRAPES) ×3 IMPLANT
DECANTER SPIKE VIAL GLASS SM (MISCELLANEOUS) ×3 IMPLANT
DERMABOND ADVANCED (GAUZE/BANDAGES/DRESSINGS) ×4
DERMABOND ADVANCED .7 DNX12 (GAUZE/BANDAGES/DRESSINGS) ×2 IMPLANT
DIFFUSER DRILL AIR PNEUMATIC (MISCELLANEOUS) ×3 IMPLANT
DRAPE C-ARM 42X72 X-RAY (DRAPES) ×3 IMPLANT
DRAPE C-ARMOR (DRAPES) ×3 IMPLANT
DRAPE LAPAROTOMY 100X72X124 (DRAPES) ×3 IMPLANT
DRAPE POUCH INSTRU U-SHP 10X18 (DRAPES) ×3 IMPLANT
DRAPE SURG 17X23 STRL (DRAPES) ×3 IMPLANT
DRSG OPSITE POSTOP 4X10 (GAUZE/BANDAGES/DRESSINGS) ×3 IMPLANT
DURAPREP 26ML APPLICATOR (WOUND CARE) ×3 IMPLANT
ELECT REM PT RETURN 9FT ADLT (ELECTROSURGICAL) ×3
ELECTRODE REM PT RTRN 9FT ADLT (ELECTROSURGICAL) ×1 IMPLANT
EVACUATOR 1/8 PVC DRAIN (DRAIN) IMPLANT
GAUZE SPONGE 4X4 12PLY STRL (GAUZE/BANDAGES/DRESSINGS) ×3 IMPLANT
GAUZE SPONGE 4X4 16PLY XRAY LF (GAUZE/BANDAGES/DRESSINGS) IMPLANT
GLOVE BIO SURGEON STRL SZ8 (GLOVE) ×6 IMPLANT
GLOVE BIOGEL PI IND STRL 8 (GLOVE) ×2 IMPLANT
GLOVE BIOGEL PI IND STRL 8.5 (GLOVE) ×2 IMPLANT
GLOVE BIOGEL PI INDICATOR 8 (GLOVE) ×4
GLOVE BIOGEL PI INDICATOR 8.5 (GLOVE) ×4
GLOVE ECLIPSE 8.0 STRL XLNG CF (GLOVE) ×6 IMPLANT
GLOVE EXAM NITRILE LRG STRL (GLOVE) IMPLANT
GLOVE EXAM NITRILE XL STR (GLOVE) IMPLANT
GLOVE EXAM NITRILE XS STR PU (GLOVE) IMPLANT
GOWN STRL REUS W/ TWL LRG LVL3 (GOWN DISPOSABLE) IMPLANT
GOWN STRL REUS W/ TWL XL LVL3 (GOWN DISPOSABLE) ×3 IMPLANT
GOWN STRL REUS W/TWL 2XL LVL3 (GOWN DISPOSABLE) IMPLANT
GOWN STRL REUS W/TWL LRG LVL3 (GOWN DISPOSABLE)
GOWN STRL REUS W/TWL XL LVL3 (GOWN DISPOSABLE) ×6
HEMOSTAT POWDER KIT SURGIFOAM (HEMOSTASIS) ×3 IMPLANT
KIT BASIN OR (CUSTOM PROCEDURE TRAY) ×3 IMPLANT
KIT INFUSE X SMALL 1.4CC (Orthopedic Implant) ×3 IMPLANT
KIT POSITION SURG JACKSON T1 (MISCELLANEOUS) ×3 IMPLANT
KIT ROOM TURNOVER OR (KITS) ×3 IMPLANT
MILL MEDIUM DISP (BLADE) IMPLANT
NEEDLE HYPO 25X1 1.5 SAFETY (NEEDLE) ×3 IMPLANT
NEEDLE SPNL 18GX3.5 QUINCKE PK (NEEDLE) IMPLANT
NS IRRIG 1000ML POUR BTL (IV SOLUTION) ×3 IMPLANT
OIL CARTRIDGE MAESTRO DRILL (MISCELLANEOUS) ×3
PACK LAMINECTOMY NEURO (CUSTOM PROCEDURE TRAY) ×3 IMPLANT
PAD ARMBOARD 7.5X6 YLW CONV (MISCELLANEOUS) ×9 IMPLANT
PATTIES SURGICAL .5 X.5 (GAUZE/BANDAGES/DRESSINGS) IMPLANT
PATTIES SURGICAL .5 X1 (DISPOSABLE) IMPLANT
PATTIES SURGICAL 1X1 (DISPOSABLE) IMPLANT
PUTTY BONE ATTRAX 5CC STRIP (Putty) ×3 IMPLANT
ROD RELINE-O 5.5X300 STRT NS (Rod) ×2 IMPLANT
ROD RELINE-O 5.5X300MM STRT (Rod) ×4 IMPLANT
SCREW LOCK RELINE 5.5 TULIP (Screw) ×12 IMPLANT
SCREW RELINE-O POLY 5.5X45MM (Screw) ×12 IMPLANT
SPONGE LAP 4X18 X RAY DECT (DISPOSABLE) IMPLANT
SPONGE SURGIFOAM ABS GEL 100 (HEMOSTASIS) ×3 IMPLANT
STAPLER SKIN PROX WIDE 3.9 (STAPLE) IMPLANT
SUT VIC AB 1 CT1 18XBRD ANBCTR (SUTURE) ×2 IMPLANT
SUT VIC AB 1 CT1 8-18 (SUTURE) ×4
SUT VIC AB 2-0 CT1 18 (SUTURE) ×6 IMPLANT
SUT VIC AB 3-0 SH 8-18 (SUTURE) ×6 IMPLANT
SYR 5ML LL (SYRINGE) IMPLANT
TOWEL GREEN STERILE (TOWEL DISPOSABLE) ×3 IMPLANT
TOWEL GREEN STERILE FF (TOWEL DISPOSABLE) ×3 IMPLANT
TRAY FOLEY W/METER SILVER 16FR (SET/KITS/TRAYS/PACK) ×3 IMPLANT
WATER STERILE IRR 1000ML POUR (IV SOLUTION) ×3 IMPLANT

## 2016-12-02 NOTE — Anesthesia Procedure Notes (Signed)
Procedure Name: Intubation Date/Time: 12/02/2016 11:13 AM Performed by: Lance Coon Pre-anesthesia Checklist: Patient identified, Emergency Drugs available, Suction available, Patient being monitored and Timeout performed Patient Re-evaluated:Patient Re-evaluated prior to induction Oxygen Delivery Method: Circle system utilized Preoxygenation: Pre-oxygenation with 100% oxygen Induction Type: IV induction Ventilation: Mask ventilation without difficulty Laryngoscope Size: 3 and Miller Grade View: Grade I Tube type: Oral Tube size: 7.0 mm Number of attempts: 1 Airway Equipment and Method: Stylet Placement Confirmation: ETT inserted through vocal cords under direct vision,  positive ETCO2 and breath sounds checked- equal and bilateral Secured at: 21 cm Tube secured with: Tape Dental Injury: Teeth and Oropharynx as per pre-operative assessment

## 2016-12-02 NOTE — Progress Notes (Signed)
Pt states she would like to go to [facility] in [place] for Rehab following surgery. Awaiting PT/OT recommendations. Called SW to start the process for possible SNF placement. Holli Humbles, RN

## 2016-12-02 NOTE — Anesthesia Preprocedure Evaluation (Signed)
Anesthesia Evaluation  Patient identified by MRN, date of birth, ID band Patient awake    Reviewed: Allergy & Precautions, NPO status , Patient's Chart, lab work & pertinent test results  History of Anesthesia Complications Negative for: history of anesthetic complications  Airway Mallampati: II  TM Distance: >3 FB Neck ROM: Full    Dental  (+) Edentulous Upper, Edentulous Lower   Pulmonary neg pulmonary ROS,    breath sounds clear to auscultation       Cardiovascular hypertension, Pt. on medications (-) angina(-) Past MI and (-) CHF  Rhythm:Regular Rate:Normal     Neuro/Psych  Headaches,  Neuromuscular disease negative psych ROS   GI/Hepatic Neg liver ROS, GERD  Medicated and Controlled,  Endo/Other  Hypothyroidism   Renal/GU negative Renal ROS  negative genitourinary   Musculoskeletal  (+) Arthritis , Osteoarthritis,    Abdominal Normal abdominal exam  (+)   Peds negative pediatric ROS (+)  Hematology negative hematology ROS (+) anemia ,   Anesthesia Other Findings - HLD  Reproductive/Obstetrics negative OB ROS                             Anesthesia Physical Anesthesia Plan  ASA: II  Anesthesia Plan: General   Post-op Pain Management:    Induction:   PONV Risk Score and Plan: 3 and Ondansetron, Dexamethasone and Midazolam  Airway Management Planned: Oral ETT  Additional Equipment: None  Intra-op Plan:   Post-operative Plan: Extubation in OR  Informed Consent: I have reviewed the patients History and Physical, chart, labs and discussed the procedure including the risks, benefits and alternatives for the proposed anesthesia with the patient or authorized representative who has indicated his/her understanding and acceptance.   Dental advisory given  Plan Discussed with: CRNA and Surgeon  Anesthesia Plan Comments:         Anesthesia Quick Evaluation

## 2016-12-02 NOTE — Brief Op Note (Addendum)
12/02/2016  1:52 PM  PATIENT:  Latoya Stevenson  76 y.o. female  PRE-OPERATIVE DIAGNOSIS:  Closed compression fracture of L2 vertebra; Other Idiopathic scoliosis   POST-OPERATIVE DIAGNOSIS:  Closed compression fracture of L2 vertebra; Other Idiopathic scoliosis  PROCEDURE:  Procedure(s) with comments: Lumbar decompression/Fusion L1-L3 (possibly T12 to L5) with exploration of previous fusion L3-L5 (N/A) - Lumbar decompression/Fusion L1-L3 (possibly T12 to L5) with exploration of previous fusion L3-L5 with segmental instrumentation and posterolateral arthrodesis  Procedures performed:  1.  Exploration of fusion L 3 - L 5     2.  Pedicle screw fixation T 12 - L 5 levels     3.  Posterolateral arthrodesis T 12 - L 5 levels   SURGEON:  Surgeon(s) and Role:    * Erline Levine, MD - Primary    * Earnie Larsson, MD - Assisting  PHYSICIAN ASSISTANT:   ASSISTANTS: Poteat, RN   ANESTHESIA:   general  EBL:  Total I/O In: 1500 [I.V.:1500] Out: 400 [Urine:300; Blood:100]  BLOOD ADMINISTERED:none  DRAINS: none   LOCAL MEDICATIONS USED:  MARCAINE    and LIDOCAINE   SPECIMEN:  No Specimen  DISPOSITION OF SPECIMEN:  N/A  COUNTS:  YES  TOURNIQUET:  * No tourniquets in log *  DICTATION: DICTATION: Patient is 76 year old woman with lumbar scoliosis and previous fusion L 3-5 level with XLIF and lateral plate at  L 23, who developed painful compression fracture and progressive deformity.  It was elected to take her to surgery for exploration of previous fusion with  fusion from T 12 - L 5 levels.   Procedure: Patient was placed in a prone position on the [place] table after smooth and uncomplicated induction of general endotracheal anesthesia. Her low back was prepped and draped in usual sterile fashion with betadine scrub and DuraPrep. Area of incision was infiltrated with local lidocaine. Incision was made to the lumbodorsal fascia was incised and exposure was performed of the L 1 - L 5 spinous  processes laminae facet joint and transverse processes. Previous hardware was exposed.  This was covered with dense bone growth and required a chisel to remove the bone to expose the previously placed hardware. The bone scres were removed and appeared to be solidly in bone and there was dense bridging bone across the L 4 5 level.  Intraoperative x-ray was obtained which confirmed correct orientation with marker probes at L1  Through L 4 levels. All screws appeared firmly in bone and there was no suggestion of loosening.  The left L 3 screw was removed, and the rods were removed and all other screws left in place.  The posterolateral region was extensively decorticated and pedicle probes were placed at T 12 and L 1 levels bilaterally. Intraoperative fluoroscopy confirmed correct orientationin the AP and lateral plane. 45 x 5.5 mm pedicle screws were placed at L 1 bilaterally and T 12 bilaterally.  We placed rods from T 12 through L 5 levels and locked them down in situ.  Final x-rays demonstrated well-positioned interbody grafts and pedicle screw fixation. The posterolateral region was packed with extra small BMP with Attrax and 20 cc bone graft on the right and a similar amount on the left. The wound was irrigated.  Fascia was closed with 1 Vicryl sutures skin edges were reapproximated 2 and 3-0 Vicryl sutures. Long-acting Marcaine was injected in the deep musculature.  The wound was dressed with Dermabond and  an occlusive dressing the patient was extubated in the  operating room and taken to recovery in stable satisfactory condition she tolerated traction well counts were correct at the end of the case.   PLAN OF CARE: Admit to inpatient   PATIENT DISPOSITION:  PACU - hemodynamically stable.   Delay start of Pharmacological VTE agent (>24hrs) due to surgical blood loss or risk of bleeding: yes

## 2016-12-02 NOTE — Op Note (Addendum)
12/02/2016  1:52 PM  PATIENT:  Latoya Stevenson  76 y.o. female  PRE-OPERATIVE DIAGNOSIS:  Closed compression fracture of L2 vertebra; Other Idiopathic scoliosis   POST-OPERATIVE DIAGNOSIS:  Closed compression fracture of L2 vertebra; Other Idiopathic scoliosis  PROCEDURE:  Procedure(s) with comments: Lumbar decompression/Fusion L1-L3 (possibly T12 to L5) with exploration of previous fusion L3-L5 (N/A) - Lumbar decompression/Fusion L1-L3 (possibly T12 to L5) with exploration of previous fusion L3-L5 with segmental instrumentation and posterolateral arthrodesis  Procedures performed:  1.  Exploration of fusion L 3 - L 5     2.  Pedicle screw fixation T 12 - L 5 levels     3.  Posterolateral arthrodesis T 12 - L 5 levels  SURGEON:  Surgeon(s) and Role:    * Erline Levine, MD - Primary    * Earnie Larsson, MD - Assisting  PHYSICIAN ASSISTANT:   ASSISTANTS: Poteat, RN   ANESTHESIA:   general  EBL:  Total I/O In: 1500 [I.V.:1500] Out: 400 [Urine:300; Blood:100]  BLOOD ADMINISTERED:none  DRAINS: none   LOCAL MEDICATIONS USED:  MARCAINE    and LIDOCAINE   SPECIMEN:  No Specimen  DISPOSITION OF SPECIMEN:  N/A  COUNTS:  YES  TOURNIQUET:  * No tourniquets in log *  DICTATION: DICTATION: Patient is 76 year old woman with lumbar scoliosis and previous fusion L 3-5 level with XLIF and lateral plate at  L 23, who developed painful compression fracture and progressive deformity.  It was elected to take her to surgery for exploration of previous fusion with  fusion from T 12 - L 5 levels.   Procedure: Patient was placed in a prone position on the [place] table after smooth and uncomplicated induction of general endotracheal anesthesia. Her low back was prepped and draped in usual sterile fashion with betadine scrub and DuraPrep. Area of incision was infiltrated with local lidocaine. Incision was made to the lumbodorsal fascia was incised and exposure was performed of the L 1 - L 5 spinous  processes laminae facet joint and transverse processes. Previous hardware was exposed.  This was covered with dense bone growth and required a chisel to remove the bone to expose the previously placed hardware. The bone scres were removed and appeared to be solidly in bone and there was dense bridging bone across the L 4 5 level.  Intraoperative x-ray was obtained which confirmed correct orientation with marker probes at L1  Through L 4 levels. All screws appeared firmly in bone and there was no suggestion of loosening.  The left L 3 screw was removed, and the rods were removed and all other screws left in place.  The posterolateral region was extensively decorticated and pedicle probes were placed at T 12 and L 1 levels bilaterally. Intraoperative fluoroscopy confirmed correct orientationin the AP and lateral plane. 45 x 5.5 mm pedicle screws were placed at L 1 bilaterally and T 12 bilaterally.  We placed rods from T 12 through L 5 levels and locked them down in situ.  Final x-rays demonstrated well-positioned interbody grafts and pedicle screw fixation. The posterolateral region was packed with extra small BMP with Attrax and 20 cc bone graft on the right and a similar amount on the left. The wound was irrigated.  Fascia was closed with 1 Vicryl sutures skin edges were reapproximated 2 and 3-0 Vicryl sutures. Long-acting Marcaine was injected in the deep musculature.  The wound was dressed with Dermabond and  an occlusive dressing the patient was extubated in the operating  room and taken to recovery in stable satisfactory condition she tolerated traction well counts were correct at the end of the case.   PLAN OF CARE: Admit to inpatient   PATIENT DISPOSITION:  PACU - hemodynamically stable.   Delay start of Pharmacological VTE agent (>24hrs) due to surgical blood loss or risk of bleeding: yes

## 2016-12-02 NOTE — H&P (Signed)
Patient ID:   [id number] Patient: Latoya Stevenson  Date of Birth: July 11, 1940 Visit Type: Office Visit   Date: 11/19/2016 09:45 AM Provider: Marchia Meiers. Vertell Limber MD   This 76 year old female presents for back pain.   History of Present Illness: 1.  back pain  09/09/16: XLIF L2-3 with lateral plate from the left  Patient returns, reporting "more pain than I was in before the surgery". She notes she had a bone density test last year, and a history of osteoporosis. She has increased her Percocet 07/3250 q.i.d., continues Robaxin 500 t.i.d., and has begun ibuprofen 600 mg t.i.d.  X-rays on [place]         Medical/Surgical/Interim History Reviewed, no change.  Last detailed document date:09/13/2014.     Family History: Reviewed, no changes.  Last detailed document date:09/13/2014.   Social History: Reviewed, no changes. Last detailed document date: 09/13/2014.    MEDICATIONS(added, continued or stopped this visit): Started Medication Directions Instruction Stopped   amlodipine 5 mg tablet Take as directed     atorvastatin 40 mg tablet Take as directed     calcium Take as directed    11/16/2014 Cymbalta 60 mg capsule,delayed release take 1 capsule by oral route  every day     diphenhydramine 25 mg tablet Take as directed     fexofenadine 180 mg tablet Take as directed     Flonase 50 mcg/actuation nasal spray,suspension Use as directed     ibuprofen 200 mg capsule take 1 capsule by oral route  every 6 hours as needed     irbesartan 300 mg tablet Take as directed     levothyroxine 150 mcg tablet Take as directed     multivitamin with minerals Take as directed     nabumetone 500 mg tablet take 1 tablet by oral route 2 times every day     omeprazole 20 mg capsule,delayed release Take as directed     Osteo Bi-Flex Take as directed    10/06/2016 Percocet 5 mg-325 mg tablet take 1 tablet by oral route 2 times every day as needed     Sleep Aid (doxylamine) 25 mg tablet Take as directed      Tylenol 325 mg tablet Take as directed     Vitamin D3 5,000 unit tablet Take as directed     vitamin E 400 unit capsule Take as directed    11/16/2014 Xanax 0.25 mg tablet take 1 tablet by oral route  qHS prn       ALLERGIES: Ingredient Reaction Medication Name Comment  NITROFURANTOIN MACROCRYSTALLINE Swelling Macrodantin   CELECOXIB Swelling Celebrex   PENICILLINS Unknown    METRONIDAZOLE Swelling Flagyl    Reviewed, no changes.    Vitals Date Temp F BP Pulse Ht In Wt Lb BMI BSA Pain Score  11/19/2016  173/79 83 63 174.8 30.96  7/10      IMPRESSION Patient presents post-operatively. Patient reports back pain that is worse than before surgery. X-ray shows further compression fracture of L2. Schedule extension of fusion posteriorly.   Completed Orders (this encounter) Order Details Reason Side Interpretation Result Initial Treatment Date Region  Hypertension education Patient to follow up with primary care provider.        Dietary management education, guidance, and counseling patient encouraged to eat a well balanced diet         Assessment/Plan # Detail Type Description   1. Assessment Other idiopathic scoliosis, site unspecified (M41.20).       2. Assessment  Essential (primary) hypertension (I10).       3. Assessment Body mass index (BMI) 30.0-30.9, adult (Z68.30).           Pain Management Plan Pain Scale: 7/10. Method: Numeric Pain Intensity Scale. Location: back. Onset: 05/29/2016. Duration: varies. Quality: discomforting. Pain management follow-up plan of care: Patient taking medication as prescribed..  Schedule extension of fusion posteriorly. Nurse education given.  Orders: Instruction(s)/Education: Assessment Instruction  I10 Hypertension education  Z68.30 Dietary management education, guidance, and counseling             Provider:  Vertell Limber MD, Marchia Meiers 11/19/2016 9:58 AM  Dictation edited by: Lucita Lora    CC Providers: [address] [place],  [place]  [id number]-   Ben[address]  [place], [place] [id number]-              Electronically signed by Marchia Meiers. Vertell Limber MD on 11/20/2016 06:00 PM

## 2016-12-02 NOTE — Transfer of Care (Signed)
Immediate Anesthesia Transfer of Care Note  Patient: Latoya Stevenson  Procedure(s) Performed: Procedure(s) with comments: Lumbar decompression/Fusion L1-L3 (possibly T12 to L3) with exploration of previous fusion L2-L5 (N/A) - Lumbar decompression/Fusion L1-L3 (possibly T12 to L3) with exploration of previous fusion L2-L5  Patient Location: PACU  Anesthesia Type:General  Level of Consciousness: awake, alert  and oriented  Airway & Oxygen Therapy: Patient Spontanous Breathing and Patient connected to nasal cannula oxygen  Post-op Assessment: Report given to RN and Post -op Vital signs reviewed and stable  Post vital signs: Reviewed and stable  Last Vitals:  Vitals:   12/02/16 0946  BP: (!) 165/84  Pulse: 78  Resp: 18  Temp: 36.8 C  SpO2: 99%    Last Pain:  Vitals:   12/02/16 0946  TempSrc: Oral         Complications: No apparent anesthesia complications

## 2016-12-02 NOTE — Progress Notes (Signed)
Pharmacy Antibiotic Note  Latoya Stevenson is a 76 y.o. female admitted on 12/02/2016 for planned spinal surgery. Pharmacy consulted for Vancomycin for post-op surgical prophylaxis.  The patient does not have a drain in place per RN report. Pre-op dose given at 1117. CrCl~50-60 ml/min.   Plan: 1. Vancomycin 1g IV x 1 dose at 2300 today 2. Pharmacy will sign off as no further doses expected at this time  Height: 5' 3.5" (161.3 cm) Weight: 175 lb (79.4 kg) IBW/kg (Calculated) : 53.55  Temp (24hrs), Avg:97.7 F (36.5 C), Min:97.2 F (36.2 C), Max:98.3 F (36.8 C)   Recent Labs Lab 11/27/16 1132  WBC 7.2  CREATININE 0.90    Estimated Creatinine Clearance: 55.3 mL/min (by C-G formula based on SCr of 0.9 mg/dL).    Allergies  Allergen Reactions  . Macrodantin [Nitrofurantoin Macrocrystal] Anaphylaxis, Hives and Swelling  . Penicillins Anaphylaxis and Swelling    PATIENT HAD A PCN REACTION WITH IMMEDIATE RASH, FACIAL/TONGUE/THROAT SWELLING, SOB, OR LIGHTHEADEDNESS WITH HYPOTENSION:  #  #  #  YES  #  #  #   Has patient had a PCN reaction causing severe rash involving mucus membranes or skin necrosis: No Has patient had a PCN reaction that required hospitalization: No Has patient had a PCN reaction occurring within the last 10 years: No If all of the above answers are "NO", then may proceed with Cephalosporin use.   . Celebrex [Celecoxib] Swelling    SWELLING REACTION UNSPECIFIED   . Flagyl [Metronidazole Hcl] Swelling    SWELLING REACTION UNSPECIFIED     Thank you for allowing pharmacy to be a part of this patient's care.  Lawson Radar 12/02/2016 3:09 PM

## 2016-12-03 ENCOUNTER — Encounter (HOSPITAL_COMMUNITY): Payer: Self-pay | Admitting: *Deleted

## 2016-12-03 MED ORDER — MAGNESIUM HYDROXIDE 400 MG/5ML PO SUSP
30.0000 mL | Freq: Every day | ORAL | Status: DC
  Administered 2016-12-03 – 2016-12-04 (×2): 30 mL via ORAL
  Filled 2016-12-03 (×2): qty 30

## 2016-12-03 NOTE — Progress Notes (Addendum)
Subjective: Patient reports "My incision hurts. My legs don't hurt"  Objective: Vital signs in last 24 hours: Temp:  [97.2 F (36.2 C)-98.7 F (37.1 C)] 98.4 F (36.9 C) (09/19 0420) Pulse Rate:  [65-129] 90 (09/19 0420) Resp:  [12-19] 16 (09/19 0420) BP: (114-165)/(51-88) 137/83 (09/19 0420) SpO2:  [87 %-99 %] 97 % (09/19 0420) Weight:  [79.4 kg (175 lb)] 79.4 kg (175 lb) (09/18 0946)  Intake/Output from previous day: 09/18 0701 - 09/19 0700 In: 1710 [P.O.:210; I.V.:1500] Out: 1200 [Urine:1100; Blood:100] Intake/Output this shift: No intake/output data recorded.  Alert, conversant. Voices concern re: perceived low urine output. Good appetite. Voiding without difficulty. Bladder scan shows no retention. Afebrile. Incision without erythema, swelling, or drainage beneath honeycomb & Dermabond. Good strength BLE.   Lab Results: No results for input(s): WBC, HGB, HCT, PLT in the last 72 hours. BMET No results for input(s): NA, K, CL, CO2, GLUCOSE, BUN, CREATININE, CALCIUM in the last 72 hours.  Studies/Results: Dg Lumbar Spine 2-3 Views  Result Date: 12/02/2016 CLINICAL DATA:  Lumbar decompression and fusion from L1-L3 EXAM: LUMBAR SPINE - 2-3 VIEW COMPARISON:  Lumbar spine films of 11/19/2016 FINDINGS: Previous fusion at L2-3 laterally and posteriorly at L4-5 and L5-S1 is noted. Current C-arm images show placement of inter pedicular screws at T12 and L1 for posterior fusion. IMPRESSION: Screws placed at T12 and L1 for posterior fusion. Electronically Signed   By: Ivar Drape M.D.   On: 12/02/2016 13:11   Dg C-arm 1-60 Min  Result Date: 12/02/2016 CLINICAL DATA:  Lumbar compression and posterior fusion possibly from L1-2 film 3 EXAM: DG C-ARM 61-120 MIN COMPARISON:  Lumbar spine films of 11/19/2016 FINDINGS: For C-arm spot films were returned. Fluoroscopy time of 22 seconds was recorded. IMPRESSION: C-arm fluoroscopy provided during posterior fusion of the upper lumbar spine.  Electronically Signed   By: Ivar Drape M.D.   On: 12/02/2016 13:12    Assessment/Plan: Improving  LOS: 1 day  Mobilize in brace today. Reassured re: urine o/p  - will monitor.    Verdis Prime 12/03/2016, 7:34 AM  Patient is doing well.  Continue to mobilize today.

## 2016-12-03 NOTE — [place] FL2 (Signed)
[place] MEDICAID FL2 LEVEL OF CARE SCREENING TOOL     IDENTIFICATION  Patient Name: Latoya Stevenson Birthdate: 1940/07/28 Sex: female Admission Date (Current Location): 12/02/2016  [facility] and [place] Number:  Herbalist and Address:  [facility], [place] [address] [place],  [address]      Provider Number: [id number]  Attending Physician Name and Address:  Erline Levine, MD  Relative Name and Phone Number:  Jeanella Cara, son, [phone]    Current Level of Care: Hospital Recommended Level of Care: [place] Prior Approval Number:    Date Approved/Denied:   PASRR Number: [id number]  Discharge Plan: SNF    Current Diagnoses: Patient Active Problem List   Diagnosis Date Noted  . Lumbar compression fracture ([place]) 12/02/2016  . 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 Lumbar scoliosis 10/27/2014    Orientation RESPIRATION BLADDER Height & Weight     Self, Time, Situation, Place  Normal Continent Weight: 175 lb (79.4 kg) Height:  5' 3.5" (161.3 cm)  BEHAVIORAL SYMPTOMS/MOOD NEUROLOGICAL BOWEL NUTRITION STATUS      Continent Diet (please see DC summary)  AMBULATORY STATUS COMMUNICATION OF NEEDS Skin   Limited Assist Verbally Surgical wounds (closed incision on back; honeycomb dressing)                       Personal Care Assistance Level of Assistance  Bathing, Feeding, Dressing Bathing Assistance: Limited assistance Feeding assistance: Independent Dressing Assistance: Limited assistance     Functional Limitations Info  Sight, Hearing, Speech Sight Info: Adequate Hearing Info: Adequate Speech Info: Adequate    SPECIAL CARE FACTORS FREQUENCY  PT (By licensed PT), OT (By licensed OT)     PT Frequency: 5x/week OT Frequency: 5x/week            Contractures Contractures Info: Not present    Additional Factors Info  Code Status, Allergies Code Status Info: Full Allergies Info:  Macrodantin Nitrofurantoin Macrocrystal, Penicillins, Celebrex Celecoxib, Flagyl Metronidazole Hcl           Current Medications (12/03/2016):  This is the current hospital active medication list Current Facility-Administered Medications  Medication Dose Route Frequency Provider Last Rate Last Dose  . 0.9 %  sodium chloride infusion  250 mL Intravenous Continuous Erline Levine, MD      . acetaminophen (TYLENOL) tablet 650 mg  650 mg Oral Q4H PRN Erline Levine, MD       Or  . acetaminophen (TYLENOL) suppository 650 mg  650 mg Rectal Q4H PRN Erline Levine, MD      . alum & mag hydroxide-simeth (MAALOX/MYLANTA) 200-200-20 MG/5ML suspension 30 mL  30 mL Oral Q6H PRN Erline Levine, MD      . amLODipine (NORVASC) tablet 5 mg  5 mg Oral QHS Erline Levine, MD      . atorvastatin (LIPITOR) tablet 40 mg  40 mg Oral QHS Erline Levine, MD   40 mg at 12/02/16 2158  . bisacodyl (DULCOLAX) suppository 10 mg  10 mg Rectal Daily PRN Erline Levine, MD      . cholecalciferol (VITAMIN D) tablet 5,000 Units  5,000 Units Oral Daily Erline Levine, MD   5,000 Units at 12/03/16 [phone]  . dextrose 5 % and 0.45 % NaCl with KCl 20 mEq/L infusion   Intravenous Continuous Erline Levine, MD      . diphenhydrAMINE (BENADRYL) capsule 25 mg  25 mg Oral QHS Erline Levine, MD   25  mg at 12/02/16 2158  . docusate sodium (COLACE) capsule 100 mg  100 mg Oral BID Erline Levine, MD   100 mg at 12/03/16 0949  . fluticasone (FLONASE) 50 MCG/ACT nasal spray 2 spray  2 spray Each Nare QHS Erline Levine, MD   2 spray at 12/02/16 2158  . ibuprofen (ADVIL,MOTRIN) tablet 400 mg  400 mg Oral Q8H PRN Erline Levine, MD      . irbesartan Levy Sjogren) tablet 300 mg  300 mg Oral Daily Erline Levine, MD   300 mg at 12/03/16 0948  . levothyroxine (SYNTHROID, LEVOTHROID) tablet 150 mcg  150 mcg Oral QAC breakfast Erline Levine, MD   150 mcg at 12/03/16 0757  . loratadine (CLARITIN) tablet 10 mg  10 mg Oral Daily Erline Levine, MD   10 mg at 12/03/16 0949   . meclizine (ANTIVERT) tablet 12.5 mg  12.5 mg Oral TID PRN Erline Levine, MD      . menthol-cetylpyridinium (CEPACOL) lozenge 3 mg  1 lozenge Oral PRN Erline Levine, MD       Or  . phenol (CHLORASEPTIC) mouth spray 1 spray  1 spray Mouth/Throat PRN Erline Levine, MD      . methocarbamol (ROBAXIN) tablet 500 mg  500 mg Oral Q6H PRN Erline Levine, MD   500 mg at 12/03/16 0948   Or  . methocarbamol (ROBAXIN) 500 mg in dextrose 5 % 50 mL IVPB  500 mg Intravenous Q6H PRN Erline Levine, MD      . morphine 4 MG/ML injection 2 mg  2 mg Intravenous Q2H PRN Erline Levine, MD   2 mg at 12/02/16 1524  . multivitamin with minerals tablet 1 tablet  1 tablet Oral Daily Erline Levine, MD   1 tablet at 12/03/16 [phone]  . ondansetron (ZOFRAN) tablet 4 mg  4 mg Oral Q6H PRN Erline Levine, MD       Or  . ondansetron [facility]) injection 4 mg  4 mg Intravenous Q6H PRN Erline Levine, MD      . oxyCODONE (Oxy IR/ROXICODONE) immediate release tablet 5-10 mg  5-10 mg Oral Q3H PRN Erline Levine, MD   5 mg at 12/03/16 0949  . oxyCODONE-acetaminophen (PERCOCET/ROXICET) 5-325 MG per tablet 1 tablet  1 tablet Oral Q4H PRN Erline Levine, MD   1 tablet at 12/03/16 0949  . pantoprazole (PROTONIX) EC tablet 40 mg  40 mg Oral Daily Erline Levine, MD   40 mg at 12/03/16 0949  . senna-docusate (Senokot-S) tablet 1 tablet  1 tablet Oral QHS PRN Erline Levine, MD      . sodium chloride flush (NS) 0.9 % injection 3 mL  3 mL Intravenous Q12H Erline Levine, MD      . sodium chloride flush (NS) 0.9 % injection 3 mL  3 mL Intravenous PRN Erline Levine, MD      . sodium phosphate (FLEET) 7-19 GM/118ML enema 1 enema  1 enema Rectal Once PRN Erline Levine, MD      . vitamin E capsule 400 Units  400 Units Oral Daily Erline Levine, MD   400 Units at 12/03/16 [phone]  . zolpidem (AMBIEN) tablet 5 mg  5 mg Oral QHS PRN Erline Levine, MD         Discharge Medications: Please see discharge summary for a list of discharge medications.  Relevant  Imaging Results:  Relevant Lab Results:   Additional Information SSN: [id number]  Estanislado Emms, LCSW

## 2016-12-03 NOTE — Progress Notes (Signed)
Physical Therapy Evaluation Patient Details Name: SOPHINA MITTEN MRN: [id number] DOB: 05-Apr-1940 Today's Date: 12/03/2016   History of Present Illness  76 y.o. female s/p lumbar fusion; extension of a previous lumbar fusion (09/09/16).   Clinical Impression  Evaluated patient and educated her on surgical precautions, bed mobility, transfers, and car transfers. Patient lives alone and was previously modified independent with RW for functional mobility and self-care activities. Patient will benefit from continued skilled PT services in SNF setting upon d/c for maximal improvements of mobility and safe return home.     Follow Up Recommendations SNF    Equipment Recommendations  None recommended by PT    Recommendations for Other Services       Precautions / Restrictions Precautions Precautions: Back;Fall Precaution Booklet Issued: Yes (comment) Precaution Comments: neuro back handout reviewed and given to patient Required Braces or Orthoses: Spinal Brace Spinal Brace: Lumbar corset;Applied in sitting position Restrictions Weight Bearing Restrictions: No      Mobility  Bed Mobility Overal bed mobility: Needs Assistance Bed Mobility: Rolling;Sidelying to Sit Rolling: Mod assist Sidelying to sit: Mod assist       General bed mobility comments: verbal cues for log roll; pt hesitant and guarded with bed mobility required mod assist to roll and elevate trunk from sidelying to sit  Transfers Overall transfer level: Needs assistance Equipment used: Rolling walker (2 wheeled) Transfers: Sit to/from Stand Sit to Stand: Min assist         General transfer comment: increased time and verbal cues for proper UE placement; min assist for power up  Ambulation/Gait Ambulation/Gait assistance: Supervision Ambulation Distance (Feet): 45 Feet (2 bouts of 45 feet) Assistive device: Rolling walker (2 wheeled) Gait Pattern/deviations: Step-through pattern;Narrow base of  support;Decreased stride length Gait velocity: decreased Gait velocity interpretation: Below normal speed for age/gender General Gait Details: slow and cautious with complaints of pain throughout ambulation. No LOB noted.  Stairs            Wheelchair Mobility    Modified Rankin (Stroke Patients Only)       Balance Overall balance assessment: Needs assistance Sitting-balance support: Feet supported Sitting balance-Leahy Scale: Good     Standing balance support: During functional activity;No upper extremity supported Standing balance-Leahy Scale: Fair Standing balance comment: one standing rest break during ambulation; pt able to remove UE support from RW; no LOB noted                             Pertinent Vitals/Pain Pain Assessment: Faces Pain Score: 5  Faces Pain Scale: Hurts little more Pain Location: incision and buttocks Pain Descriptors / Indicators: Operative site guarding;Guarding;Grimacing;Sharp Pain Intervention(s): Monitored during session    Home Living Family/patient expects to be discharged to:: Skilled nursing facility Living Arrangements: Alone   Type of Home: House Home Access: Stairs to enter Entrance Stairs-Rails: Left;Right;Can reach both Entrance Stairs-Number of Steps: 3 Home Layout: One level Home Equipment: [place] - 2 wheels;Bedside commode;Shower seat      Prior Function Level of Independence: Independent with assistive device(s)         Comments: RW     Hand Dominance        Extremity/Trunk Assessment   Upper Extremity Assessment Upper Extremity Assessment: Defer to OT evaluation    Lower Extremity Assessment Lower Extremity Assessment: RLE deficits/detail;LLE deficits/detail RLE Deficits / Details: sensation intact to LT; strength 4-/5 LLE Deficits / Details: sensation intact to LT; strength  4-/5       Communication   Communication: No difficulties  Cognition Arousal/Alertness: Awake/alert Behavior  During Therapy: WFL for tasks assessed/performed Overall Cognitive Status: Within Functional Limits for tasks assessed                                        General Comments General comments (skin integrity, edema, etc.): Pt educated on surgical precautions, bed mobility, transfers and car transfers.    Exercises     Assessment/Plan    PT Assessment Patient needs continued PT services  PT Problem List Decreased balance;Decreased mobility;Decreased strength       PT Treatment Interventions Gait training;Stair training;Functional mobility training;Balance training;Neuromuscular re-education;Patient/family education    PT Goals (Current goals can be found in the Care Plan section)  Acute Rehab PT Goals Patient Stated Goal: go to rehab and then get back home PT Goal Formulation: With patient Time For Goal Achievement: 12/17/16 Potential to Achieve Goals: Good    Frequency Min 5X/week   Barriers to discharge        Co-evaluation               AM-PAC PT "6 Clicks" Daily Activity  Outcome Measure Difficulty turning over in bed (including adjusting bedclothes, sheets and blankets)?: Unable Difficulty moving from lying on back to sitting on the side of the bed? : Unable Difficulty sitting down on and standing up from a chair with arms (e.g., wheelchair, bedside commode, etc,.)?: A Lot Help needed moving to and from a bed to chair (including a wheelchair)?: A Lot Help needed walking in hospital room?: A Little Help needed climbing 3-5 steps with a railing? : A Lot 6 Click Score: 11    End of Session Equipment Utilized During Treatment: Gait belt Activity Tolerance: Patient tolerated treatment well Patient left: in chair;with call bell/phone within reach Nurse Communication: Mobility status PT Visit Diagnosis: Difficulty in walking, not elsewhere classified (R26.2)    Time: 4431-5400 PT Time Calculation (min) (ACUTE ONLY): 22 min   Charges:   PT  Evaluation $PT Eval Moderate Complexity: 1 Mod     PT G CodesSindy Guadeloupe, SPT [phone] office   Margarita Grizzle 12/03/2016, 8:52 AM

## 2016-12-03 NOTE — Therapy (Signed)
Occupational Therapy Evaluation Patient Details Name: Latoya Stevenson MRN: [id number] DOB: 10/19/1940 Today's Date: 12/03/2016    History of Present Illness 76 y.o. female s/p lumbar fusion; extension of a previous lumbar fusion (09/09/16).  No PMH included in file.    Clinical Impression   Pt reports using AE and requiring increased time to complete dressing tasks with use of RW for functional mobility. Currently pt requires min assist  for LB ADLs, transfers, and functional mobility and supervision for all other ADLs. Pt lives alone and states that no family is available to help at d/c. Pt would benefit from skilled rehab upon d/c to increase independence and promote a safe d/c home. OT will follow acutely to address established goals.     Follow Up Recommendations  SNF;Supervision/Assistance - 24 hour    Equipment Recommendations  Other (comment) (TBD at next venue of care)    Recommendations for Other Services       Precautions / Restrictions Precautions Precautions: Back;Fall Precaution Booklet Issued: Yes (comment) Precaution Comments: Reviewed back precautions with pt Required Braces or Orthoses: Spinal Brace Spinal Brace: Lumbar corset;Applied in sitting position Restrictions Weight Bearing Restrictions: No      Mobility Bed Mobility Overal bed mobility: Needs Assistance Bed Mobility: Sit to Sidelying;Rolling Rolling: Min assist Sidelying to sit: Min assist       General bed mobility comments: Verbal cues for technique. Physical assist for bilateral LEs. Pt reports concern about technique and being able to manage that on her own.   Transfers Overall transfer level: Needs assistance Equipment used: Rolling walker (2 wheeled) Transfers: Sit to/from Stand Sit to Stand: Min assist         General transfer comment: Verbal cues to maintain back precautions, verbal cues for hand placement, and increased time and effort to boost to standing.    Balance Overall  balance assessment: Needs assistance Sitting-balance support: No upper extremity supported;Feet supported Sitting balance-Leahy Scale: Good     Standing balance support: Bilateral upper extremity supported;During functional activity Standing balance-Leahy Scale: Fair Standing balance comment: Relies on RW for BUE support.                            ADL either performed or assessed with clinical judgement   ADL Overall ADL's : Needs assistance/impaired     Grooming: Supervision/safety;Set up;Standing   Upper Body Bathing: Supervision/ safety;Set up;Sitting   Lower Body Bathing: Minimal assistance;Sit to/from stand   Upper Body Dressing : Supervision/safety;Standing   Lower Body Dressing: Minimal assistance;Sit to/from stand   Toilet Transfer: Minimal assistance;Ambulation;Comfort height toilet;RW   Toileting- Clothing Manipulation and Hygiene: Minimal assistance;Sit to/from stand   Tub/ Shower Transfer: Minimal assistance;Ambulation;3 in 1;Rolling walker;Grab bars   Functional mobility during ADLs: Minimal assistance;Rolling walker General ADL Comments: Pt educated on compensatory techniques to complete ADLs with back precautions. Pt educated on use of AE to increase independence with LB ADLs. Pt required increased time and effort to complete ADLs.      Vision         Perception     Praxis      Pertinent Vitals/Pain Pain Assessment: 0-10 Pain Score: 10-Worst pain ever Faces Pain Scale: Hurts little more Pain Location: Operative site Pain Descriptors / Indicators: Operative site guarding;Guarding;Grimacing;Sharp Pain Intervention(s): Monitored during session     Hand Dominance     Extremity/Trunk Assessment Upper Extremity Assessment Upper Extremity Assessment: Overall WFL for tasks assessed  Lower Extremity Assessment Lower Extremity Assessment: Defer to PT evaluation RLE Deficits / Details: sensation intact to LT; strength 4-/5 LLE Deficits /  Details: sensation intact to LT; strength 4-/5       Communication Communication Communication: No difficulties   Cognition Arousal/Alertness: Awake/alert Behavior During Therapy: WFL for tasks assessed/performed Overall Cognitive Status: Within Functional Limits for tasks assessed                                     General Comments  Pt educated on surgical precautions, bed mobility, transfers and car transfers.    Exercises     Shoulder Instructions      Home Living Family/patient expects to be discharged to:: Skilled nursing facility Living Arrangements: Alone   Type of Home: House Home Access: Stairs to enter Entrance Stairs-Number of Steps: 3 Entrance Stairs-Rails: Left;Right;Can reach both Home Layout: One level     Bathroom Shower/Tub: Teacher, early years/pre: Standard Bathroom Accessibility: Yes How Accessible: Accessible via walker Home Equipment: [place] - 2 wheels;Bedside commode;Shower seat          Prior Functioning/Environment Level of Independence: Independent with assistive device(s)        Comments: Pt reports extended time needed to complete LB ADLs with use of AE. Use of RW for functional mobility.        OT Problem List: Decreased activity tolerance;Impaired balance (sitting and/or standing);Decreased safety awareness;Decreased knowledge of precautions;Decreased knowledge of use of DME or AE;Pain      OT Treatment/Interventions: Self-care/ADL training;Energy conservation;DME and/or AE instruction;Therapeutic activities;Cognitive remediation/compensation;Patient/family education;Balance training    OT Goals(Current goals can be found in the care plan section) Acute Rehab OT Goals Patient Stated Goal: To get better at rehab and then go home OT Goal Formulation: With patient Time For Goal Achievement: 12/17/16 Potential to Achieve Goals: Good  OT Frequency: Min 2X/week   Barriers to D/C:             Co-evaluation              AM-PAC PT "6 Clicks" Daily Activity     Outcome Measure Help from another person eating meals?: None Help from another person taking care of personal grooming?: A Little Help from another person toileting, which includes using toliet, bedpan, or urinal?: A Little Help from another person bathing (including washing, rinsing, drying)?: A Little Help from another person to put on and taking off regular upper body clothing?: None Help from another person to put on and taking off regular lower body clothing?: A Little 6 Click Score: 20   End of Session Equipment Utilized During Treatment: Gait belt;Rolling walker Nurse Communication: Mobility status  Activity Tolerance: Patient tolerated treatment well Patient left: in bed;with call bell/phone within reach  OT Visit Diagnosis: Unsteadiness on feet (R26.81);Pain Pain - part of body:  (Back)                Time: 8546-2703 OT Time Calculation (min): 29 min Charges:    OT Visit Charge  Mod Eval  Self care 1  G-Codes:     Boykin Peek, OTS [phone]   Boykin Peek 12/03/2016, 11:31 AM

## 2016-12-04 ENCOUNTER — Encounter
Admission: RE | Admit: 2016-12-04 | Discharge: 2016-12-04 | Disposition: A | Discharge status: Home / Self Care | Payer: Medicare Other | Source: Ambulatory Visit | Attending: Internal Medicine | Admitting: Internal Medicine

## 2016-12-04 NOTE — Clinical Social Work Note (Signed)
Clinical Social Work Assessment  Patient Details  Name: Latoya Stevenson MRN: [id number] Date of Birth: [birth year]  Date of referral:  12/04/16               Reason for consult:  Facility Placement                Permission sought to share information with:  Facility Sport and exercise psychologist, Family Supports Permission granted to share information::  Yes, Verbal Permission Granted  Name::     Jeanella Cara  Agency::  SNFs  Relationship::  son  Contact Information:  [phone]  Housing/Transportation Living arrangements for the past 2 months:  Single Family Home Source of Information:  Patient Patient Interpreter Needed:  None Criminal Activity/Legal Involvement Pertinent to Current Situation/Hospitalization:  No - Comment as needed Significant Relationships:  Friend, Adult Children Lives with:  Self Do you feel safe going back to the place where you live?  Yes Need for family participation in patient care:  No (Coment)  Care giving concerns: Patient from home alone. Has family in the area. PT recommending SNF.   Social Worker assessment / plan: CSW met with patient at bedside. Patient alert and oriented and pleasant. Patient prefers SNF at [facility] in [place]. CSW sent out referrals and patient accepted at [facility]. CSW to follow and support with discharge to SNF when ready.  Employment status:  Retired Forensic scientist:  [employer] PT Recommendations:  [place] / Referral to community resources:  [place]  Patient/Family's Response to care: Patient appreciative of care.  Patient/Family's Understanding of and Emotional Response to Diagnosis, Current Treatment, and Prognosis: Patient articulated good understanding of her medical condition. Patient hopeful for placement at preferred SNF and for mobility recovery during rehab so she can return home.  Emotional Assessment Appearance:  Appears stated  age Attitude/Demeanor/Rapport:  Unable to Assess (appropriate) Affect (typically observed):  Calm, Pleasant Orientation:  Oriented to Self, Oriented to Place, Oriented to  Time, Oriented to Situation Alcohol / Substance use:  Not Applicable Psych involvement (Current and /or in the community):  No (Comment)  Discharge Needs  Concerns to be addressed:  Discharge Planning Concerns Readmission within the last 30 days:  No Current discharge risk:  Physical Impairment Barriers to Discharge:  Continued Medical Work up   Estanislado Emms, LCSW 12/04/2016, 9:10 AM

## 2016-12-04 NOTE — Progress Notes (Signed)
Physical Therapy Treatment Patient Details Name: Latoya Stevenson MRN: [id number] DOB: 1940/12/28 Today's Date: 12/04/2016    History of Present Illness 76 y.o. female s/p lumbar fusion; extension of a previous lumbar fusion (09/09/16).  No PMH included in file.     PT Comments    Patient progressing towards goals. Patient increased distance of ambulation with supervision for safety. Patient continues to be limited by pain including when attempting to negotiate stairs. Patient expressed gratitude for therapy at end of session and is determined to improve. Will follow to maximize mobility. Patient eager to be transferred to SNF due to previous employment at [employer] and relationships she has built as a result. Recommendation of SNF remains due to above deficits and lack of supervision available to patient at this time.  Follow Up Recommendations  SNF     Equipment Recommendations  None recommended by PT    Recommendations for Other Services       Precautions / Restrictions Precautions Precautions: Back;Fall Precaution Booklet Issued: Yes (comment) Precaution Comments: Reviewed back precautions with pt Required Braces or Orthoses: Spinal Brace Spinal Brace: Lumbar corset;Applied in sitting position Restrictions Weight Bearing Restrictions: No    Mobility  Bed Mobility Overal bed mobility: Needs Assistance Bed Mobility: Sidelying to Sit (Patient was in sidelying upon arrival.)   Sidelying to sit: Min assist       General bed mobility comments: Patient required use of bed rail to pull self further into sidelying position. Patient was verbally cued to come up on elbow to transfer from sidelying to sitting with min assist at trunk for elevation into sitting position.  Transfers Overall transfer level: Needs assistance Equipment used: Rolling walker (2 wheeled) Transfers: Sit to/from Stand Sit to Stand: Min assist         General transfer comment: Patient received verbal  cues for hand placement on bed to boost up to walker. Patient required several effort to build momentum to boost to stand.  Ambulation/Gait Ambulation/Gait assistance: Min guard Ambulation Distance (Feet): 400 Feet Assistive device: Rolling walker (2 wheeled) Gait Pattern/deviations: Step-through pattern;Decreased step length - right;Decreased step length - left;Decreased stride length;Trunk flexed Gait velocity: decreased Gait velocity interpretation: Below normal speed for age/gender General Gait Details: Patient ambulated with R lateral lean and recieved tactile and verbal cuing to maintain neutral alignment. Patient referred to pain throughout session. Left Lateral hip lean when patient was in stance phase on L LE, trendelenburg occured. Patient verbally cued to relax shoulders, and maintain appropriate posture throughout gait, as well as maintaining close range with walker.   Stairs Stairs: Yes       General stair comments: Attempted single stair but limited secondary to pain. Patient attempted to step first with L LE and then R LE and required several attempts to shift weight up off of posterior LE but was unable to secondary to pain.  Wheelchair Mobility    Modified Rankin (Stroke Patients Only)       Balance Overall balance assessment: Needs assistance Sitting-balance support: No upper extremity supported;Feet supported Sitting balance-Leahy Scale: Good   Postural control: Right lateral lean (In sitting and ambulation for pain relief.) Standing balance support: Bilateral upper extremity supported;During functional activity Standing balance-Leahy Scale: Fair Standing balance comment: Relies on RW for BUE support.                             Cognition Arousal/Alertness: Awake/alert Behavior During Therapy: WFL for  tasks assessed/performed Overall Cognitive Status: Within Functional Limits for tasks assessed                                  General Comments: Patient was verbose.      Exercises      General Comments General comments (skin integrity, edema, etc.): Reviewed pt's precautions, bed mobility, stairs, and car transfers. Educated on general walking program.      Pertinent Vitals/Pain Pain Assessment: 0-10 Pain Score: 8  Pain Location: Operative site Pain Descriptors / Indicators: Operative site guarding;Guarding;Grimacing;Sharp Pain Intervention(s): Limited activity within patient's tolerance;Monitored during session;RN gave pain meds during session    Home Living                      Prior Function            PT Goals (current goals can now be found in the care plan section) Acute Rehab PT Goals Patient Stated Goal: To get better at rehab and then go home PT Goal Formulation: With patient Time For Goal Achievement: 12/17/16 Potential to Achieve Goals: Good Progress towards PT goals: Progressing toward goals    Frequency    Min 5X/week      PT Plan Current plan remains appropriate    Co-evaluation              AM-PAC PT "6 Clicks" Daily Activity  Outcome Measure  Difficulty turning over in bed (including adjusting bedclothes, sheets and blankets)?: A Lot Difficulty moving from lying on back to sitting on the side of the bed? : Unable Difficulty sitting down on and standing up from a chair with arms (e.g., wheelchair, bedside commode, etc,.)?: A Lot Help needed moving to and from a bed to chair (including a wheelchair)?: A Little Help needed walking in hospital room?: A Little Help needed climbing 3-5 steps with a railing? : Total 6 Click Score: 12    End of Session Equipment Utilized During Treatment: Gait belt (Back brace) Activity Tolerance: Patient limited by pain Patient left: in chair;with call bell/phone within reach   PT Visit Diagnosis: Difficulty in walking, not elsewhere classified (R26.2);Pain;Other abnormalities of gait and mobility (R26.89)     Time:  6073-7106 PT Time Calculation (min) (ACUTE ONLY): 36 min  Charges:                       G Codes:       Latoya Stevenson SPT   Latoya Stevenson 12/04/2016, 9:38 AM

## 2016-12-04 NOTE — Anesthesia Postprocedure Evaluation (Signed)
f Anesthesia Post Note  Patient: Latoya Stevenson  Procedure(s) Performed: Procedure(s) (LRB): Lumbar decompression/Fusion L1-L3 (possibly T12 to L3) with exploration of previous fusion L2-L5 (N/A)     Patient location during evaluation: PACU Anesthesia Type: General Level of consciousness: awake and alert Pain management: pain level controlled Vital Signs Assessment: post-procedure vital signs reviewed and stable Respiratory status: spontaneous breathing, nonlabored ventilation, respiratory function stable and patient connected to nasal cannula oxygen Cardiovascular status: blood pressure returned to baseline and stable Postop Assessment: no apparent nausea or vomiting Anesthetic complications: no    Last Vitals:  Vitals:   12/04/16 1247 12/04/16 1602  BP: 118/79 110/73  Pulse: 84 81  Resp: 18 18  Temp: 36.8 C 37 C  SpO2: 94% 99%    Last Pain:  Vitals:   12/04/16 1820  TempSrc:   PainSc: 8                  Blakeleigh Domek

## 2016-12-04 NOTE — Progress Notes (Addendum)
Subjective: Patient reports "I'm having pain...this lowest rib and sternum from my old injury, and my back"  Objective: Vital signs in last 24 hours: Temp:  [97.9 F (36.6 C)-99.7 F (37.6 C)] 99.7 F (37.6 C) (09/20 0400) Pulse Rate:  [52-91] 91 (09/20 0400) Resp:  [16-18] 18 (09/20 0400) BP: (116-142)/(67-99) 142/82 (09/20 0400) SpO2:  [95 %-100 %] 97 % (09/20 0400)  Intake/Output from previous day: 09/19 0701 - 09/20 0700 In: 360 [P.O.:360] Out: 300 [Urine:300] Intake/Output this shift: No intake/output data recorded.  Alert, conversant. Reports left sided rib pain and sternal pain "the same I've had from that injury" and lumbar incisional pain. Passing gas, but no BM yet. Nondistended and nontender. Incision without erythema, swelling or drainage beneath honeycomb & Dermabond. Good strength BLE.   Lab Results: No results for input(s): WBC, HGB, HCT, PLT in the last 72 hours. BMET No results for input(s): NA, K, CL, CO2, GLUCOSE, BUN, CREATININE, CALCIUM in the last 72 hours.  Studies/Results: Dg Lumbar Spine 2-3 Views  Result Date: 12/02/2016 CLINICAL DATA:  Lumbar decompression and fusion from L1-L3 EXAM: LUMBAR SPINE - 2-3 VIEW COMPARISON:  Lumbar spine films of 11/19/2016 FINDINGS: Previous fusion at L2-3 laterally and posteriorly at L4-5 and L5-S1 is noted. Current C-arm images show placement of inter pedicular screws at T12 and L1 for posterior fusion. IMPRESSION: Screws placed at T12 and L1 for posterior fusion. Electronically Signed   By: Ivar Drape M.D.   On: 12/02/2016 13:11   Dg C-arm 1-60 Min  Result Date: 12/02/2016 CLINICAL DATA:  Lumbar compression and posterior fusion possibly from L1-2 film 3 EXAM: DG C-ARM 61-120 MIN COMPARISON:  Lumbar spine films of 11/19/2016 FINDINGS: For C-arm spot films were returned. Fluoroscopy time of 22 seconds was recorded. IMPRESSION: C-arm fluoroscopy provided during posterior fusion of the upper lumbar spine. Electronically  Signed   By: Ivar Drape M.D.   On: 12/02/2016 13:12    Assessment/Plan: Improving  LOS: 2 days  Continue to mobilize in LSO. Work on bowels.    Verdis Prime 12/04/2016, 7:30 AM   Patient is making good progress.

## 2016-12-05 ENCOUNTER — Other Ambulatory Visit: Payer: Self-pay

## 2016-12-05 MED ORDER — OXYCODONE-ACETAMINOPHEN 5-325 MG PO TABS: 1.0000 | ORAL_TABLET | ORAL | 0 refills | Status: DC | PRN

## 2016-12-05 NOTE — Progress Notes (Signed)
Patient will discharge to [place]. Anticipated discharge date: 12/05/16 Family notified:  CSW offered to call patient's family and patient indicated she has talked to her son and explained her discharge plan. Transportation by: Corey Harold  Nurse to call report to [phone].   CSW signing off.  Estanislado Emms, [place]  Clinical Social Worker

## 2016-12-05 NOTE — Progress Notes (Signed)
Subjective: Patient reports "I'm feeling better! My pain is much less and I've been walking some"  Objective: Vital signs in last 24 hours: Temp:  [97.8 F (36.6 C)-98.6 F (37 C)] 97.8 F (36.6 C) (09/21 0719) Pulse Rate:  [72-86] 72 (09/21 0719) Resp:  [16-18] 18 (09/21 0719) BP: (107-137)/(62-80) 107/62 (09/21 0719) SpO2:  [94 %-99 %] 97 % (09/21 0719)  Intake/Output from previous day: 09/20 0701 - 09/21 0700 In: 480 [P.O.:480] Out: -  Intake/Output this shift: No intake/output data recorded.  Alert, sitting in chair. Reports decreased lumbar pain, no rib/sternal pain, and no leg pain. Incision without erythema, swelling, or drainage beneath honeycomb & Dermabond. Good strength BLE. Stands and ambulates with rolling walker. Brace in use.   Lab Results: No results for input(s): WBC, HGB, HCT, PLT in the last 72 hours. BMET No results for input(s): NA, K, CL, CO2, GLUCOSE, BUN, CREATININE, CALCIUM in the last 72 hours.  Studies/Results: No results found.  Assessment/Plan: Improving  LOS: 3 days  Per DrStern, d/c to SNF. Rx's Robaxin & Oxycodone to chart for SNF. Pt may remove honeycomb drsg. Dermabond will wash off in 2-3 weeks. Ok to shower today. She has f/u appt with DrStern in October.   Verdis Prime 12/05/2016, 10:00 AM

## 2016-12-05 NOTE — Discharge Instructions (Signed)

## 2016-12-05 NOTE — Discharge Summary (Signed)
Physician Discharge Summary  Patient ID: Latoya Stevenson MRN: [id number] DOB/AGE: Dec 21, 1940 76 y.o.  Admit date: 12/02/2016 Discharge date: 12/05/2016  Admission Diagnoses: Closed compression fracture of L2 vertebra; Other Idiopathic scoliosis     Discharge Diagnoses:  Active Problems:   Lumbar compression fracture [facility])   Discharged Condition: good  Hospital Course: Latoya Stevenson was admitted for surgery with dx L2 compression fracture and scoliosis. Following uncomplicated  exploration and revision with fixation T12-L5, she recovered well and transferred to [facility] for nursing care and therapies. She has progressed steadily.  Consults: None  Significant Diagnostic Studies: radiology: X-Ray: intra-op  Treatments: surgery: Lumbar decompression/Fusion L1-L3 (possibly T12 to L5) with exploration of previous fusion L3-L5 (N/A) - Lumbar decompression/Fusion L1-L3 (possibly T12 to L5) with exploration of previous fusion L3-L5 with segmental instrumentation and posterolateral arthrodesis     Discharge Exam: Blood pressure 107/62, pulse 72, temperature 97.8 F (36.6 C), temperature source Oral, resp. rate 18, height 5' 3.5" (1.613 m), weight 79.4 kg (175 lb), SpO2 97 %. Alert, sitting in chair. Reports decreased lumbar pain, no rib/sternal pain, and no leg pain. Incision without erythema, swelling, or drainage beneath honeycomb & Dermabond. Good strength BLE. Stands and ambulates with rolling walker. Brace in use.      Disposition: Discharge to SNF. Rx's Robaxin & Oxycodone to chart for SNF. Pt may remove honeycomb drsg. Dermabond will wash off in 2-3 weeks. Ok to shower today. She has f/u appt with DrStern in October.     Discharge Instructions    Diet - low sodium heart healthy    Complete by:  As directed    Increase activity slowly    Complete by:  As directed      Allergies as of 12/05/2016      Reactions   Macrodantin [nitrofurantoin Macrocrystal] Anaphylaxis, Hives, Swelling   Penicillins Anaphylaxis, Swelling   PATIENT HAD A PCN REACTION WITH IMMEDIATE RASH, FACIAL/TONGUE/THROAT SWELLING, SOB, OR LIGHTHEADEDNESS WITH HYPOTENSION:  #  #  #  YES  #  #  #   Has patient had a PCN reaction causing severe rash involving mucus membranes or skin necrosis: No Has patient had a PCN reaction that required hospitalization: No Has patient had a PCN reaction occurring within the last 10 years: No If all of the above answers are "NO", then may proceed with Cephalosporin use.   Celebrex [celecoxib] Swelling   SWELLING REACTION UNSPECIFIED    Flagyl [metronidazole Hcl] Swelling   SWELLING REACTION UNSPECIFIED       Medication List    STOP taking these medications   ibuprofen 200 MG tablet Commonly known as:  ADVIL,MOTRIN     TAKE these medications   amLODipine 5 MG tablet Commonly known as:  NORVASC Take 5 mg by mouth at bedtime.   atorvastatin 40 MG tablet Commonly known as:  LIPITOR Take 40 mg by mouth at bedtime.   CALCIUM 600 + D PO Take 2 tablets by mouth daily.   diphenhydrAMINE 25 MG tablet Commonly known as:  SOMINEX Take 25 mg by mouth at bedtime. For sleep   doxylamine (Sleep) 25 MG tablet Commonly known as:  UNISOM Take 25 mg by mouth at bedtime.   fexofenadine 180 MG tablet Commonly known as:  ALLEGRA Take 180 mg by mouth at bedtime.   fluticasone 50 MCG/ACT nasal spray Commonly known as:  FLONASE Place 2 sprays into both nostrils at bedtime.   Glucosamine Chondroitin Triple Tabs Take 1 capsule by mouth 2 (two)  times daily.   irbesartan 300 MG tablet Commonly known as:  AVAPRO Take 300 mg by mouth daily.   levothyroxine 150 MCG tablet Commonly known as:  SYNTHROID, LEVOTHROID Take 150 mcg by mouth daily before breakfast.   meclizine 12.5 MG tablet Commonly known as:  ANTIVERT Take 12.5 mg by mouth 3 (three) times daily as needed for dizziness.   methocarbamol 500 MG tablet Commonly known as:  ROBAXIN Take 1 tablet (500 mg total)  by mouth every 6 (six) hours as needed for muscle spasms.   multivitamin with minerals Tabs tablet Take 1 tablet by mouth daily.   omeprazole 20 MG capsule Commonly known as:  PRILOSEC Take 20 mg by mouth daily before breakfast.   oxyCODONE-acetaminophen 5-325 MG tablet Commonly known as:  PERCOCET/ROXICET Take 1 tablet by mouth every 4 (four) hours as needed for moderate pain or severe pain. What changed:  Another medication with the same name was added. Make sure you understand how and when to take each.   oxyCODONE-acetaminophen 5-325 MG tablet Commonly known as:  PERCOCET/ROXICET Take 1-2 tablets by mouth every 4 (four) hours as needed for moderate pain or severe pain. What changed:  You were already taking a medication with the same name, and this prescription was added. Make sure you understand how and when to take each.   Vitamin D-3 5000 units Tabs Take 5,000 Units by mouth daily.   vitamin E 400 UNIT capsule Take 400 Units by mouth daily.            Discharge Care Instructions        Start     Ordered   12/05/16 0000  oxyCODONE-acetaminophen (PERCOCET/ROXICET) 5-325 MG tablet  Every 4 hours PRN     12/05/16 0710   12/05/16 0000  Increase activity slowly     12/05/16 0710   12/05/16 0000  Diet - low sodium heart healthy     12/05/16 0710      Contact information for follow-up providers    Erline Levine, MD Follow up.   Specialty:  Neurosurgery Contact information: [address] [place] [address] [phone]            Contact information for after-discharge care    Destination    [facility] Follow up.   Specialty:  [place] information: [address] [place] [phone]                  Signed: Verdis Prime 12/05/2016, 10:05 AM

## 2016-12-05 NOTE — Clinical Social Work Placement (Signed)
   CLINICAL SOCIAL WORK PLACEMENT  NOTE  Date:  12/05/2016  Patient Details  Name: Latoya Stevenson MRN: [id number] Date of Birth: [birth year]  Clinical Social Work is seeking post-discharge placement for this patient at the [place] level of care (*CSW will initial, date and re-position this form in  chart as items are completed):  Yes   Patient/family provided with [place] Work Department's list of facilities offering this level of care within the geographic area requested by the patient (or if unable, by the patient's family).  Yes   Patient/family informed of their freedom to choose among providers that offer the needed level of care, that participate in Medicare, Medicaid or managed care program needed by the patient, have an available bed and are willing to accept the patient.  Yes   Patient/family informed of [facility]'s ownership interest in [facility] and [facility], as well as of the fact that they are under no obligation to receive care at these facilities.  PASRR submitted to EDS on 12/03/16     PASRR number received on 12/03/16     Existing PASRR number confirmed on       FL2 transmitted to all facilities in geographic area requested by pt/family on 12/03/16     FL2 transmitted to all facilities within larger geographic area on       Patient informed that his/her managed care company has contracts with or will negotiate with certain facilities, including the following:  [employer]     Yes   Patient/family informed of bed offers received.  Patient chooses bed at [facility]     Physician recommends and patient chooses bed at      Patient to be transferred to [facility] on 12/05/16.  Patient to be transferred to facility by [facility]     Patient family notified on 12/05/16 of transfer.  Name of family member notified:  patient indicated she already called her son and told him where she's going     PHYSICIAN    Additional Comment:    _______________________________________________ Estanislado Emms, LCSW 12/05/2016, 10:49 AM

## 2016-12-05 NOTE — Telephone Encounter (Signed)
Rx sent to [facility] phone : [phone] , fax : [phone]  

## 2016-12-05 NOTE — Progress Notes (Signed)
Patient is discharged from room 3C02 at this time. Alert and in stable condition. Report given to receiving nurse Melissa walker, LPN at [facility] with all questions answered. Left unit via stretcher by [facility] with all belongings at side.

## 2016-12-05 NOTE — Progress Notes (Signed)
Physical Therapy Treatment Patient Details Name: Latoya Stevenson MRN: [id number] DOB: 03/26/1940 Today's Date: 12/05/2016    History of Present Illness 76 y.o. female s/p lumbar fusion; extension of a previous lumbar fusion (09/09/16).  No PMH included in file.     PT Comments    Patient continues to progress towards goals. Patient reported less pain and was eager to ambulate with therapy. Patient reported that she was ambulating this morning in accordance to general walking program. Patient able to perform all bed mobility and ambulation with min guard for safety. SNF continues to be appropriate recommendation due to lack of supervision available at home to patient at this time. PT will continue to follow to maximize independence and mobility.  Follow Up Recommendations  SNF     Equipment Recommendations  None recommended by PT    Recommendations for Other Services       Precautions / Restrictions Precautions Precautions: Back;Fall Precaution Booklet Issued: Yes (comment) Precaution Comments: Reviewed back precautions with pt Required Braces or Orthoses: Spinal Brace Spinal Brace: Lumbar corset;Applied in sitting position Restrictions Weight Bearing Restrictions: No   Mobility  Bed Mobility Overal bed mobility: Needs Assistance Bed Mobility: Rolling;Sidelying to Sit Rolling: Min guard Sidelying to sit: Min guard       General bed mobility comments: Patient with VC's for proper hand placement for sidelying to sit.  Transfers Overall transfer level: Needs assistance Equipment used: Rolling walker (2 wheeled) Transfers: Sit to/from Stand Sit to Stand: Min guard (For safety.)         General transfer comment: VC's for hand placement when transferring from sit to stand. No assistance needed to boost up to standing but slowed velocity of boost to standing.  Ambulation/Gait Ambulation/Gait assistance: Min guard (For safety.) Ambulation Distance (Feet): 400  Feet Assistive device: Rolling walker (2 wheeled) Gait Pattern/deviations: Step-through pattern;Decreased step length - right;Decreased step length - left;Decreased stride length;Trunk flexed Gait velocity: decreased Gait velocity interpretation: Below normal speed for age/gender General Gait Details: Patient with tendency to ambulate with R lateral lean. VC's for posture and to keep RW in close proximity.VC's to hold onto walker when performing standing for further support from UE's.   Stairs            Wheelchair Mobility    Modified Rankin (Stroke Patients Only)       Balance Overall balance assessment: Needs assistance Sitting-balance support: No upper extremity supported;Feet supported Sitting balance-Leahy Scale: Good Sitting balance - Comments: Able to manage donning and doffing of brace and gown for changing, without breaking back precautions. Postural control: Right lateral lean Standing balance support: Bilateral upper extremity supported;During functional activity Standing balance-Leahy Scale: Good Standing balance comment: Stood in static standing without B UE support to adjust gown during gait.                            Cognition Arousal/Alertness: Awake/alert Behavior During Therapy: WFL for tasks assessed/performed Overall Cognitive Status: Within Functional Limits for tasks assessed                                 General Comments: Patient continued to be verbose.      Exercises      General Comments General comments (skin integrity, edema, etc.): Reviewed back precautions and bed mobility, provided with a new precaution sheet.      Pertinent Vitals/Pain  Pain Assessment: 0-10 Pain Score: 4  Pain Location: Operative site Pain Descriptors / Indicators: Aching;Discomfort;Sore Pain Intervention(s): Limited activity within patient's tolerance;Monitored during session;Premedicated before session (Pt received meds at 6am.)     Home Living                      Prior Function            PT Goals (current goals can now be found in the care plan section) Acute Rehab PT Goals Patient Stated Goal: To get better at rehab and then go home PT Goal Formulation: With patient Time For Goal Achievement: 12/17/16 Potential to Achieve Goals: Good Progress towards PT goals: Progressing toward goals    Frequency    Min 5X/week      PT Plan Current plan remains appropriate    Co-evaluation              AM-PAC PT "6 Clicks" Daily Activity  Outcome Measure  Difficulty turning over in bed (including adjusting bedclothes, sheets and blankets)?: A Little Difficulty moving from lying on back to sitting on the side of the bed? : A Little Difficulty sitting down on and standing up from a chair with arms (e.g., wheelchair, bedside commode, etc,.)?: A Little Help needed moving to and from a bed to chair (including a wheelchair)?: A Little Help needed walking in hospital room?: A Little Help needed climbing 3-5 steps with a railing? : A Lot 6 Click Score: 17    End of Session Equipment Utilized During Treatment: Gait belt;Back brace Activity Tolerance: Patient tolerated treatment well Patient left: in chair;with call bell/phone within reach Nurse Communication: Mobility status;Other (comment) (Request for breakfast.) PT Visit Diagnosis: Difficulty in walking, not elsewhere classified (R26.2);Pain;Other abnormalities of gait and mobility (R26.89) Pain - part of body:  (Back)     Time: 5883-2549 PT Time Calculation (min) (ACUTE ONLY): 34 min  Charges:  $Gait Training: 23-37 mins                    G Codes:       Jamai Dolce SPT   [place] 12/05/2016, 11:47 AM

## 2016-12-08 DIAGNOSIS — M48061 Spinal stenosis, lumbar region without neurogenic claudication: Secondary | ICD-10-CM | POA: Insufficient documentation

## 2016-12-15 ENCOUNTER — Encounter
Admission: RE | Admit: 2016-12-15 | Discharge: 2016-12-15 | Disposition: A | Discharge status: Home / Self Care | Payer: Medicare Other | Source: Ambulatory Visit | Attending: Internal Medicine | Admitting: Internal Medicine

## 2016-12-18 ENCOUNTER — Other Ambulatory Visit: Payer: Self-pay

## 2016-12-18 MED ORDER — OXYCODONE-ACETAMINOPHEN 5-325 MG PO TABS: 1.0000 | ORAL_TABLET | ORAL | 0 refills | Status: DC | PRN

## 2016-12-18 NOTE — Telephone Encounter (Signed)
Rx sent to [facility] phone : [phone] , fax : [phone]  

## 2016-12-19 ENCOUNTER — Encounter: Payer: Self-pay | Admitting: Gerontology

## 2016-12-19 ENCOUNTER — Non-Acute Institutional Stay (SKILLED_NURSING_FACILITY): Payer: Medicare Other | Admitting: Gerontology

## 2016-12-19 DIAGNOSIS — S32010D Wedge compression fracture of first lumbar vertebra, subsequent encounter for fracture with routine healing: Secondary | ICD-10-CM | POA: Diagnosis not present

## 2016-12-19 NOTE — Progress Notes (Signed)
Location:   [facility] Room Number: [place] of Service:  SNF [phone])  Provider: Toni Arthurs, NP-C  PCP: Glendon Axe, MD Patient Care Team: Glendon Axe, MD as PCP - General (Internal Medicine)  Extended Emergency Contact Information Primary Emergency Contact: Eldridge Dace [place] Phone: [phone] Relation: Son  Code Status: FULL Goals of care:  Advanced Directive information Advanced Directives 12/19/2016  Does Patient Have a Medical Advance Directive? No  Type of Advance Directive -  Does patient want to make changes to medical advance directive? -  Copy of [place] in Chart? -  Would patient like information on creating a medical advance directive? -  Pre-existing out of facility DNR order (yellow form or pink MOST form) -     Allergies  Allergen Reactions  . Macrodantin [Nitrofurantoin Macrocrystal] Anaphylaxis, Hives and Swelling  . Penicillins Anaphylaxis and Swelling    PATIENT HAD A PCN REACTION WITH IMMEDIATE RASH, FACIAL/TONGUE/THROAT SWELLING, SOB, OR LIGHTHEADEDNESS WITH HYPOTENSION:  #  #  #  YES  #  #  #   Has patient had a PCN reaction causing severe rash involving mucus membranes or skin necrosis: No Has patient had a PCN reaction that required hospitalization: No Has patient had a PCN reaction occurring within the last 10 years: No If all of the above answers are "NO", then may proceed with Cephalosporin use.   . Celebrex [Celecoxib] Swelling    SWELLING REACTION UNSPECIFIED   . Flagyl [Metronidazole Hcl] Swelling    SWELLING REACTION UNSPECIFIED     Chief Complaint  Patient presents with  . Discharge Note    Discharged from SNF    HPI:  76 y.o. female seen today for discharge evaluation.  Patient was admitted to the facility for rehab following lumbar decompression with instrumentation related to closed compression fracture of L2 lumbar vertebrae.  While here, patient  participated in PT and OT.  Progressed well and is now ready for discharge home.  Patient reports pain is well controlled current regimen.  Patient reports appetite is good and is having regular BMs.  Patient denies numbness or tingling of the feet and legs incision well approximated.  Vital signs stable.  No other complaints    Past Medical History:  Diagnosis Date  . Allergic rhinitis   . Anemia    low iron  . Arthritis   . Asthma without status asthmaticus    unspecified  . Bronchitis   . Cancer ([place])     follicular variant papillary thyroid cancer  . Chronic back pain    unspecified  . Colon polyp    adenomatous  . DJD (degenerative joint disease)   . Environmental allergies   . GERD (gastroesophageal reflux disease)   . Headache    migraines as a young woman  . History of recurrent UTIs   . Hyperlipidemia   . Hypertension   . Hypothyroidism   . IBS (irritable bowel syndrome)   . Lumbar disc disease   . Mold exposure 2002   hx of   . Osteoporosis, post-menopausal   . Pneumonia 1978  . Restless legs   . Scoliosis   . Thyroid nodule    aspirated in 1997 and 2003 which were negative  . Ulcer   . Vertigo   . Wears dentures     Past Surgical History:  Procedure Laterality Date  . ABDOMINAL HYSTERECTOMY  1977   Dr. Laverta Baltimore  . ANKLE  FRACTURE SURGERY Left 2003  . ANTERIOR LAT LUMBAR FUSION Left 09/09/2016   Procedure: Left Lumbar two-three Anterolateral lumbar interbody fusion with lateral plate;  Surgeon: Erline Levine, MD;  Location: [facility];  Service: Neurosurgery;  Laterality: Left;  Left L2-3 Anterolateral lumbar interbody fusion with lateral plate  . APPENDECTOMY    . BIOPSY THYROID     x2  thyroid nodule ; Dr. Sharlet Salina and one by Dr. Sabra Heck  . BREAST CYST ASPIRATION  1970's  . broken arm  1994   wrist fracture surgery, left  . CARDIOVASCULAR STRESS TEST     2010  . COLONOSCOPY  2010  . COLONOSCOPY  07/21/2013   PHX OF CP/REPEAT 40YRS/[place]  . EYE SURGERY  Bilateral    cataract surgery with lens implant  . LUMBAR LAMINECTOMY/DECOMPRESSION MICRODISCECTOMY  11/18/2011   Procedure: LUMBAR LAMINECTOMY/DECOMPRESSION MICRODISCECTOMY 2 LEVELS;  Surgeon: Erline Levine, MD;  Location: [facility];  Service: Neurosurgery;  Laterality: N/A;  Lumbar Three-Four,  Lumbar Four-Five Decompressive Laminectomy  . OVARIAN CYST SURGERY    . POSTERIOR FUSION LUMBAR SPINE  2016  . TOTAL THYROIDECTOMY    . TRANSTHORACIC ECHOCARDIOGRAM  2010  . upper and lower GI  2010      reports that she has never smoked. She has never used smokeless tobacco. She reports that she does not drink alcohol or use drugs. Social History   Social History  . Marital status: Widowed    Spouse name: N/A  . Number of children: N/A  . Years of education: N/A   Occupational History  . Not on file.   Social History Main Topics  . Smoking status: Never Smoker  . Smokeless tobacco: Never Used  . Alcohol use No     Comment: socially  . Drug use: No  . Sexual activity: Not on file   Other Topics Concern  . Not on file   Social History Narrative  . No narrative on file   Functional Status Survey:    Allergies  Allergen Reactions  . Macrodantin [Nitrofurantoin Macrocrystal] Anaphylaxis, Hives and Swelling  . Penicillins Anaphylaxis and Swelling    PATIENT HAD A PCN REACTION WITH IMMEDIATE RASH, FACIAL/TONGUE/THROAT SWELLING, SOB, OR LIGHTHEADEDNESS WITH HYPOTENSION:  #  #  #  YES  #  #  #   Has patient had a PCN reaction causing severe rash involving mucus membranes or skin necrosis: No Has patient had a PCN reaction that required hospitalization: No Has patient had a PCN reaction occurring within the last 10 years: No If all of the above answers are "NO", then may proceed with Cephalosporin use.   . Celebrex [Celecoxib] Swelling    SWELLING REACTION UNSPECIFIED   . Flagyl [Metronidazole Hcl] Swelling    SWELLING REACTION UNSPECIFIED     Pertinent  Health Maintenance Due    Topic Date Due  . COLONOSCOPY  03/09/1992  . DEXA SCAN  03/10/2007  . INFLUENZA VACCINE  10/15/2016  . PNA vac Low Risk Adult (2 of 2 - PPSV23) 07/31/2017  . MAMMOGRAM  01/10/2018    Medications: Allergies as of 12/19/2016      Reactions   Macrodantin [nitrofurantoin Macrocrystal] Anaphylaxis, Hives, Swelling   Penicillins Anaphylaxis, Swelling   PATIENT HAD A PCN REACTION WITH IMMEDIATE RASH, FACIAL/TONGUE/THROAT SWELLING, SOB, OR LIGHTHEADEDNESS WITH HYPOTENSION:  #  #  #  YES  #  #  #   Has patient had a PCN reaction causing severe rash involving mucus membranes or skin necrosis:  No Has patient had a PCN reaction that required hospitalization: No Has patient had a PCN reaction occurring within the last 10 years: No If all of the above answers are "NO", then may proceed with Cephalosporin use.   Celebrex [celecoxib] Swelling   SWELLING REACTION UNSPECIFIED    Flagyl [metronidazole Hcl] Swelling   SWELLING REACTION UNSPECIFIED       Medication List       Accurate as of 12/19/16 11:04 AM. Always use your most recent med list.          amLODipine 5 MG tablet Commonly known as:  NORVASC Take 5 mg by mouth at bedtime.   atorvastatin 40 MG tablet Commonly known as:  LIPITOR Take 40 mg by mouth at bedtime.   CALCIUM 600+D 600-400 MG-UNIT tablet Generic drug:  Calcium Carbonate-Vitamin D Take 2 tablets by mouth daily.   diphenhydrAMINE 25 MG tablet Commonly known as:  SOMINEX Take 25 mg by mouth at bedtime as needed for sleep. For sleep   doxylamine (Sleep) 25 MG tablet Commonly known as:  UNISOM Take 25 mg by mouth at bedtime.   fexofenadine 180 MG tablet Commonly known as:  ALLEGRA Take 180 mg by mouth at bedtime.   fluticasone 50 MCG/ACT nasal spray Commonly known as:  FLONASE Place 2 sprays into both nostrils at bedtime.   Glucosamine-Chondroitin 750-600 MG Tabs Take 1 tablet by mouth 2 (two) times daily.   irbesartan 300 MG tablet Commonly known as:   AVAPRO Take 300 mg by mouth daily.   levothyroxine 150 MCG tablet Commonly known as:  SYNTHROID, LEVOTHROID Take 150 mcg by mouth daily before breakfast.   meclizine 12.5 MG tablet Commonly known as:  ANTIVERT Take 12.5 mg by mouth 3 (three) times daily as needed for dizziness.   methocarbamol 500 MG tablet Commonly known as:  ROBAXIN Take 1 tablet (500 mg total) by mouth every 6 (six) hours as needed for muscle spasms.   multivitamin with minerals Tabs tablet Take 1 tablet by mouth daily.   omeprazole 20 MG capsule Commonly known as:  PRILOSEC Take 20 mg by mouth daily before breakfast.   oxyCODONE-acetaminophen 5-325 MG tablet Commonly known as:  PERCOCET/ROXICET Take 1-2 tablets by mouth every 4 (four) hours as needed for moderate pain or severe pain.   senna 8.6 MG tablet Commonly known as:  SENOKOT Take 2 tablets by mouth 2 (two) times daily.   Vitamin D-3 5000 units Tabs Take 5,000 Units by mouth daily.   vitamin E 400 UNIT capsule Take 400 Units by mouth daily.       Review of Systems  Constitutional: Negative for activity change, appetite change, chills, diaphoresis and fever.  HENT: Negative for congestion, sneezing, sore throat, trouble swallowing and voice change.   Respiratory: Negative for apnea, cough, choking, chest tightness, shortness of breath and wheezing.   Cardiovascular: Negative for chest pain, palpitations and leg swelling.  Gastrointestinal: Negative for abdominal distention, abdominal pain, constipation, diarrhea and nausea.  Genitourinary: Negative for difficulty urinating, dysuria, frequency and urgency.  Musculoskeletal: Positive for arthralgias (typical arthritis), back pain and gait problem. Negative for myalgias.  Skin: Positive for wound. Negative for color change, pallor and rash.  Neurological: Positive for weakness. Negative for dizziness, tremors, syncope, speech difficulty, numbness and headaches.  Psychiatric/Behavioral: Negative  for agitation and behavioral problems.  All other systems reviewed and are negative.   Vitals:   12/19/16 1050  BP: 135/66  Pulse: 100  Resp: 20  Temp:  98 F (36.7 C)  SpO2: 96%  Weight: 172 lb 1.6 oz (78.1 kg)  Height: 5' 3.5" (1.613 m)   Body mass index is 30.01 kg/m. Physical Exam  Constitutional: She is oriented to person, place, and time. Vital signs are normal. She appears well-developed and well-nourished. She is active and cooperative. She does not appear ill. No distress.  HENT:  Head: Normocephalic and atraumatic.  Mouth/Throat: Uvula is midline, oropharynx is clear and moist and mucous membranes are normal. Mucous membranes are not pale, not dry and not cyanotic.  Eyes: Pupils are equal, round, and reactive to light. Conjunctivae, EOM and lids are normal.  Neck: Trachea normal, normal range of motion and full passive range of motion without pain. Neck supple. No JVD present. No tracheal deviation, no edema and no erythema present. No thyromegaly present.  Cardiovascular: Normal rate, regular rhythm, normal heart sounds, intact distal pulses and normal pulses.  Exam reveals no gallop, no distant heart sounds and no friction rub.   No murmur heard. Pulses:      Dorsalis pedis pulses are 2+ on the right side, and 2+ on the left side.  Pulmonary/Chest: Effort normal and breath sounds normal. No accessory muscle usage. No respiratory distress. She has no decreased breath sounds. She has no wheezes. She has no rhonchi. She has no rales. She exhibits no tenderness.  Abdominal: Soft. Normal appearance and bowel sounds are normal. She exhibits no distension and no ascites. There is no tenderness.  Musculoskeletal: Normal range of motion. She exhibits no edema or tenderness.  Expected osteoarthritis, stiffness  Neurological: She is alert and oriented to person, place, and time. She has normal strength.  Skin: Skin is warm and dry. Laceration noted. She is not diaphoretic. No  cyanosis. No pallor. Nails show no clubbing.     Psychiatric: She has a normal mood and affect. Her speech is normal and behavior is normal. Judgment and thought content normal. Cognition and memory are normal.  Nursing note and vitals reviewed.   Labs reviewed: Basic Metabolic Panel:  Recent Labs  09/09/16 0836 11/27/16 1132  NA 138 136  K 5.0 4.0  CL 107 106  CO2 20* 23  GLUCOSE 100* 103*  BUN 19 18  CREATININE 1.26* 0.90  CALCIUM 9.2 9.0   Liver Function Tests: No results for input(s): AST, ALT, ALKPHOS, BILITOT, PROT, ALBUMIN in the last 8760 hours. No results for input(s): LIPASE, AMYLASE in the last 8760 hours. No results for input(s): AMMONIA in the last 8760 hours. CBC:  Recent Labs  09/09/16 0836 11/27/16 1132  WBC 5.8 7.2  HGB 14.0 11.1*  HCT 43.2 35.6*  MCV 88.3 86.8  PLT 150 224   Cardiac Enzymes: No results for input(s): CKTOTAL, CKMB, CKMBINDEX, TROPONINI in the last 8760 hours. BNP: Invalid input(s): POCBNP CBG: No results for input(s): GLUCAP in the last 8760 hours.  Procedures and Imaging Studies During Stay: Dg Lumbar Spine 2-3 Views  Result Date: 12/02/2016 CLINICAL DATA:  Lumbar decompression and fusion from L1-L3 EXAM: LUMBAR SPINE - 2-3 VIEW COMPARISON:  Lumbar spine films of 11/19/2016 FINDINGS: Previous fusion at L2-3 laterally and posteriorly at L4-5 and L5-S1 is noted. Current C-arm images show placement of inter pedicular screws at T12 and L1 for posterior fusion. IMPRESSION: Screws placed at T12 and L1 for posterior fusion. Electronically Signed   By: Ivar Drape M.D.   On: 12/02/2016 13:11   Dg C-arm 1-60 Min  Result Date: 12/02/2016 CLINICAL DATA:  Lumbar  compression and posterior fusion possibly from L1-2 film 3 EXAM: DG C-ARM 61-120 MIN COMPARISON:  Lumbar spine films of 11/19/2016 FINDINGS: For C-arm spot films were returned. Fluoroscopy time of 22 seconds was recorded. IMPRESSION: C-arm fluoroscopy provided during posterior fusion  of the upper lumbar spine. Electronically Signed   By: Ivar Drape M.D.   On: 12/02/2016 13:12    Assessment/Plan:   1.  Closed compression fracture of L2 lumbar vertebrae with routine healing, subsequent encounter  Continue PT/OT  Continue exercises as taught by PT/OT  Skin care per protocol  Wear back brace as instructed  Ice as needed for pain or edema  Continue oxycodone/acetaminophen 5/325 mg 1-2 tablets p.o. every 4 hours as needed pain #30, no refill  Continue Robaxin 500 mg p.o. every 6 hours as needed spasm/cramps  Follow-up with orthopedics/neurosurgeon as instructed  Patient is being discharged with the following home health services: Home health PT/OT  Patient is being discharged with the following durable medical equipment: No current DME needs  Patient has been advised to f/u with their PCP in 1-2 weeks to bring them up to date on their rehab stay.  Social services at facility was responsible for arranging this appointment.  Pt was provided with a 30 day supply of prescriptions for medications and refills must be obtained from their PCP.  For controlled substances, a more limited supply may be provided adequate until PCP appointment only.  Future labs/tests needed: None  Family/ staff Communication:   Total Time:  Documentation:  Face to Face:  Family/Phone:  Vikki Ports, NP-C [facility] [address], [place] [address] Cell Phone (Mon-Fri 8am-5pm):  [phone] On Call:  [phone] & follow prompts after 5pm & weekends Office Phone:  [phone] Office Fax:  [phone]

## 2017-01-13 ENCOUNTER — Ambulatory Visit
Admission: RE | Admit: 2017-01-13 | Discharge: 2017-01-13 | Disposition: A | Discharge status: Home / Self Care | Payer: Medicare Other | Source: Ambulatory Visit | Attending: Internal Medicine | Admitting: Internal Medicine

## 2017-01-13 DIAGNOSIS — Z1231 Encounter for screening mammogram for malignant neoplasm of breast: Secondary | ICD-10-CM | POA: Diagnosis present

## 2017-01-15 ENCOUNTER — Other Ambulatory Visit: Payer: Self-pay | Admitting: Internal Medicine

## 2017-01-15 DIAGNOSIS — N6489 Other specified disorders of breast: Secondary | ICD-10-CM

## 2017-01-15 DIAGNOSIS — R928 Other abnormal and inconclusive findings on diagnostic imaging of breast: Secondary | ICD-10-CM

## 2017-01-21 ENCOUNTER — Ambulatory Visit
Admission: RE | Admit: 2017-01-21 | Discharge: 2017-01-21 | Disposition: A | Discharge status: Home / Self Care | Payer: Medicare Other | Source: Ambulatory Visit | Attending: Internal Medicine | Admitting: Internal Medicine

## 2017-01-21 DIAGNOSIS — R928 Other abnormal and inconclusive findings on diagnostic imaging of breast: Secondary | ICD-10-CM

## 2017-01-21 DIAGNOSIS — N6489 Other specified disorders of breast: Secondary | ICD-10-CM

## 2017-04-30 DIAGNOSIS — R1312 Dysphagia, oropharyngeal phase: Secondary | ICD-10-CM | POA: Insufficient documentation

## 2017-04-30 DIAGNOSIS — M5136 Other intervertebral disc degeneration, lumbar region: Secondary | ICD-10-CM | POA: Insufficient documentation

## 2017-04-30 DIAGNOSIS — M8589 Other specified disorders of bone density and structure, multiple sites: Secondary | ICD-10-CM | POA: Insufficient documentation

## 2017-05-01 ENCOUNTER — Other Ambulatory Visit: Payer: Self-pay | Admitting: Internal Medicine

## 2017-05-01 DIAGNOSIS — R1312 Dysphagia, oropharyngeal phase: Secondary | ICD-10-CM

## 2017-05-18 ENCOUNTER — Ambulatory Visit
Admission: RE | Admit: 2017-05-18 | Discharge: 2017-05-18 | Disposition: A | Discharge status: Home / Self Care | Payer: Medicare Other | Source: Ambulatory Visit | Attending: Internal Medicine | Admitting: Internal Medicine

## 2017-05-18 DIAGNOSIS — R1312 Dysphagia, oropharyngeal phase: Secondary | ICD-10-CM | POA: Insufficient documentation

## 2017-05-18 DIAGNOSIS — R1310 Dysphagia, unspecified: Secondary | ICD-10-CM

## 2017-05-18 NOTE — Therapy (Signed)
[place], [place], [address] Phone: [phone]   Fax:     Modified Barium Swallow  Patient Details  Name: Latoya Stevenson MRN: [id number] Date of Birth: 08/20/1940 No Data Recorded  Encounter Date: 05/18/2017  End of Session - 05/18/17 1254    Visit Number  1    Number of Visits  1    Date for SLP Re-Evaluation  05/18/17    Activity Tolerance  Patient tolerated treatment well       Past Medical History:  Diagnosis Date  . Allergic rhinitis   . Anemia    low iron  . Arthritis   . Asthma without status asthmaticus    unspecified  . Bronchitis   . Cancer ([place])     follicular variant papillary thyroid cancer  . Chronic back pain    unspecified  . Colon polyp    adenomatous  . DJD (degenerative joint disease)   . Environmental allergies   . GERD (gastroesophageal reflux disease)   . Headache    migraines as a young woman  . History of recurrent UTIs   . Hyperlipidemia   . Hypertension   . Hypothyroidism   . IBS (irritable bowel syndrome)   . Lumbar disc disease   . Mold exposure 2002   hx of   . Osteoporosis, post-menopausal   . Pneumonia 1978  . Restless legs   . Scoliosis   . Thyroid nodule    aspirated in 1997 and 2003 which were negative  . Ulcer   . Vertigo   . Wears dentures     Past Surgical History:  Procedure Laterality Date  . ABDOMINAL HYSTERECTOMY  1977   Dr. Laverta Baltimore  . ANKLE FRACTURE SURGERY Left 2003  . ANTERIOR LAT LUMBAR FUSION Left 09/09/2016   Procedure: Left Lumbar two-three Anterolateral lumbar interbody fusion with lateral plate;  Surgeon: Erline Levine, MD;  Location: [facility];  Service: Neurosurgery;  Laterality: Left;  Left L2-3 Anterolateral lumbar interbody fusion with lateral plate  . APPENDECTOMY    . BIOPSY THYROID     x2  thyroid nodule ; Dr. Sharlet [place] and one by Dr. Sabra Heck  . BREAST CYST ASPIRATION  1970's  . broken arm  1994   wrist fracture  surgery, left  . CARDIOVASCULAR STRESS TEST     2010  . COLONOSCOPY  2010  . COLONOSCOPY  07/21/2013   PHX OF CP/REPEAT 55YRS/[place]  . EYE SURGERY Bilateral    cataract surgery with lens implant  . LUMBAR LAMINECTOMY/DECOMPRESSION MICRODISCECTOMY  11/18/2011   Procedure: LUMBAR LAMINECTOMY/DECOMPRESSION MICRODISCECTOMY 2 LEVELS;  Surgeon: Erline Levine, MD;  Location: [facility];  Service: Neurosurgery;  Laterality: N/A;  Lumbar Three-Four,  Lumbar Four-Five Decompressive Laminectomy  . OVARIAN CYST SURGERY    . POSTERIOR FUSION LUMBAR SPINE  2016  . TOTAL THYROIDECTOMY    . TRANSTHORACIC ECHOCARDIOGRAM  2010  . upper and lower GI  2010    There were no vitals filed for this visit.   Subjective: Patient behavior: (alertness, ability to follow instructions, etc.): Patient able to follow directions and verbalize swallowing difficulties  Chief complaint: breads and meats lodge mid-sternum   Objective:  Radiological Procedure: A videoflouroscopic evaluation of oral-preparatory, reflex initiation, and pharyngeal phases of the swallow was performed; as well as a screening of the upper esophageal phase.  I. POSTURE: Upright in MBS chair  II. VIEW: Lateral  III. COMPENSATORY STRATEGIES: N/A  IV. BOLUSES  ADMINISTERED:   Thin Liquid: 1 small, 3 large consecutive   Nectar-thick Liquid: 1 large   Honey-thick Liquid: DNT   Puree: 2 teaspoon presentations   Mechanical Soft: 1/4 graham cracker in applesauce  V. RESULTS OF EVALUATION: A. ORAL PREPARATORY PHASE: (The lips, tongue, and velum are observed for strength and coordination)       **Overall Severity Rating: Within normal limits  B. SWALLOW INITIATION/REFLEX: (The reflex is normal if "triggered" by the time the bolus reached the base of the tongue)  **Overall Severity Rating: Within normal limits  C. PHARYNGEAL PHASE: (Pharyngeal function is normal if the bolus shows rapid, smooth, and continuous transit through the pharynx and  there is no pharyngeal residue after the swallow)  **Overall Severity Rating: Within normal limits  D. LARYNGEAL PENETRATION: (Material entering into the laryngeal inlet/vestibule but not aspirated) None  E. ASPIRATION: None  F. ESOPHAGEAL PHASE: (Screening of the upper esophagus) No observed abnormality within the viewable cervical esophagus  ASSESSMENT: This 77 year old woman; with difficulty swallowing meat and bread; is presenting with normal oropharyngeal swallowing.  Oral control of the bolus including oral hold, rotary mastication, and anterior to posterior transfer is within normal limits. Timing of the pharyngeal swallow is within normal limits.  Aspects of the pharyngeal stage of swallowing including tongue base retraction, hyolaryngeal excursion, epiglottic inversion, and duration/amplitude of UES opening are within normal limits.  There is no observed pharyngeal residue, laryngeal penetration, or tracheal aspiration.  [name] complaints do not appear to be due to oropharyngeal swallow function.  The patient may benefit from further evaluation with GI to assess esophageal function.  PLAN/RECOMMENDATIONS:   A. Diet: Usual diet- soften and moisten as needed   B. Swallowing Precautions: Reflux precautions   C. Recommended consultation to: GI   D. Therapy recommendations: speech therapy is not indicated   E. Results and recommendations were discussed with the patient immediately following the study and the final report routed to the referring MD.   Dysphagia, unspecified type - Plan: DG OP Swallowing Func-Medicare/Speech Path, DG OP Swallowing Func-Medicare/Speech Path        Problem List Patient Active Problem List   Diagnosis Date Noted  . Lumbar compression fracture ([place]) 12/02/2016  . 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 Lumbar scoliosis 10/27/2014   Leroy Sea, MS/CCC- SLP  Lou Miner 05/18/2017, 12:55 PM  [facility] [place], [place], [address] Phone: [phone]   Fax:     Name: Latoya Stevenson MRN: [id number] Date of Birth: Aug 12, 1940

## 2017-07-29 DIAGNOSIS — J3089 Other allergic rhinitis: Secondary | ICD-10-CM | POA: Insufficient documentation

## 2017-07-29 DIAGNOSIS — I83892 Varicose veins of left lower extremities with other complications: Secondary | ICD-10-CM | POA: Insufficient documentation

## 2017-07-29 DIAGNOSIS — R0609 Other forms of dyspnea: Secondary | ICD-10-CM | POA: Insufficient documentation

## 2017-08-03 ENCOUNTER — Ambulatory Visit
Admission: EM | Admit: 2017-08-03 | Discharge: 2017-08-03 | Disposition: A | Discharge status: Home / Self Care | Payer: Medicare Other | Attending: Family Medicine | Admitting: Family Medicine

## 2017-08-03 ENCOUNTER — Other Ambulatory Visit: Payer: Self-pay

## 2017-08-03 ENCOUNTER — Encounter: Payer: Self-pay | Admitting: Emergency Medicine

## 2017-08-03 DIAGNOSIS — S80861A Insect bite (nonvenomous), right lower leg, initial encounter: Secondary | ICD-10-CM | POA: Diagnosis not present

## 2017-08-03 DIAGNOSIS — W57XXXA Bitten or stung by nonvenomous insect and other nonvenomous arthropods, initial encounter: Secondary | ICD-10-CM | POA: Diagnosis not present

## 2017-08-03 DIAGNOSIS — L03317 Cellulitis of buttock: Secondary | ICD-10-CM

## 2017-08-03 MED ORDER — DOXYCYCLINE HYCLATE 100 MG PO TABS: 100.0000 mg | ORAL_TABLET | Freq: Two times a day (BID) | ORAL | 0 refills | Status: DC

## 2017-08-03 NOTE — ED Triage Notes (Signed)
Patient in today c/o tick bites. She has 3 tick bites that she wants checked.

## 2017-08-03 NOTE — ED Provider Notes (Signed)
[facility]    CSN: [id number] Arrival date & time: 08/03/17  1154     History   Chief Complaint Chief Complaint  Patient presents with  . Tick Removal    HPI Latoya Stevenson is a 77 y.o. female.   77 yo female with a c/o tick bites (to right leg and left buttock) about 2 weeks ago with redness to left buttock bite area. States she was able to remove the ticks which were embedded, however were not engorged. Denies any fevers, chills, drainage.   The history is provided by the patient.    Past Medical History:  Diagnosis Date  . Allergic rhinitis   . Anemia    low iron  . Arthritis   . Asthma without status asthmaticus    unspecified  . Bronchitis   . Cancer ([place])     follicular variant papillary thyroid cancer  . Chronic back pain    unspecified  . Colon polyp    adenomatous  . DJD (degenerative joint disease)   . Environmental allergies   . GERD (gastroesophageal reflux disease)   . Headache    migraines as a young woman  . History of recurrent UTIs   . Hyperlipidemia   . Hypertension   . Hypothyroidism   . IBS (irritable bowel syndrome)   . Lumbar disc disease   . Mold exposure 2002   hx of   . Osteoporosis, post-menopausal   . Pneumonia 1978  . Restless legs   . Scoliosis   . Thyroid nodule    aspirated in 1997 and 2003 which were negative  . Ulcer   . Vertigo   . Wears dentures     Patient Active Problem List   Diagnosis Date Noted  . Lumbar compression fracture ([place]) 12/02/2016  . 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 Lumbar scoliosis 10/27/2014    Past Surgical History:  Procedure Laterality Date  . ABDOMINAL HYSTERECTOMY  1977   Dr. Laverta Baltimore  . ANKLE FRACTURE SURGERY Left 2003  . ANTERIOR LAT LUMBAR FUSION Left 09/09/2016   Procedure: Left Lumbar two-three Anterolateral lumbar interbody fusion with lateral plate;  Surgeon: Erline Levine, MD;  Location: [facility];  Service: Neurosurgery;  Laterality: Left;  Left L2-3  Anterolateral lumbar interbody fusion with lateral plate  . APPENDECTOMY    . BIOPSY THYROID     x2  thyroid nodule ; Dr. Sharlet Salina and one by Dr. Sabra Heck  . BREAST CYST ASPIRATION  1970's  . broken arm  1994   wrist fracture surgery, left  . CARDIOVASCULAR STRESS TEST     2010  . COLONOSCOPY  2010  . COLONOSCOPY  07/21/2013   PHX OF CP/REPEAT 15YRS/[place]  . EYE SURGERY Bilateral    cataract surgery with lens implant  . LUMBAR LAMINECTOMY/DECOMPRESSION MICRODISCECTOMY  11/18/2011   Procedure: LUMBAR LAMINECTOMY/DECOMPRESSION MICRODISCECTOMY 2 LEVELS;  Surgeon: Erline Levine, MD;  Location: [facility];  Service: Neurosurgery;  Laterality: N/A;  Lumbar Three-Four,  Lumbar Four-Five Decompressive Laminectomy  . OVARIAN CYST SURGERY    . POSTERIOR FUSION LUMBAR SPINE  2016  . TOTAL THYROIDECTOMY    . TRANSTHORACIC ECHOCARDIOGRAM  2010  . upper and lower GI  2010    OB History   None      Home Medications    Prior to Admission medications   Medication Sig Start Date End Date Taking? Authorizing Provider  amLODipine (NORVASC) 5 MG tablet Take 5 mg by mouth at bedtime.  Yes [provider]  atorvastatin (LIPITOR) 40 MG tablet Take 40 mg by mouth at bedtime.    Yes [provider]  Calcium Carbonate-Vitamin D (CALCIUM 600+D) 600-400 MG-UNIT tablet Take 2 tablets by mouth daily.   Yes [provider]  Cholecalciferol (VITAMIN D-3) 5000 UNITS TABS Take 5,000 Units by mouth daily.    Yes [provider]  diphenhydrAMINE (SOMINEX) 25 MG tablet Take 25 mg by mouth at bedtime as needed for sleep. For sleep   Yes [provider]  doxylamine, Sleep, (UNISOM) 25 MG tablet Take 25 mg by mouth at bedtime.    Yes [provider]  fexofenadine (ALLEGRA) 180 MG tablet Take 180 mg by mouth at bedtime.    Yes [provider]  fluticasone (FLONASE) 50 MCG/ACT nasal spray Place 2 sprays into both nostrils at bedtime.    Yes [provider]  Glucosamine-Chondroitin 750-600 MG TABS Take 1 tablet by mouth 2 (two) times daily.   Yes [provider]  irbesartan (AVAPRO) 300 MG tablet Take 300 mg by mouth daily.    Yes [provider]  levothyroxine (SYNTHROID, LEVOTHROID) 150 MCG tablet Take 150 mcg by mouth daily before breakfast.    Yes [provider]  Multiple Vitamin (MULTIVITAMIN WITH MINERALS) TABS Take 1 tablet by mouth daily.   Yes [provider]  omeprazole (PRILOSEC) 20 MG capsule Take 20 mg by mouth daily before breakfast.    Yes [provider]  vitamin E 400 UNIT capsule Take 400 Units by mouth daily.     Yes [provider]  doxycycline (VIBRA-TABS) 100 MG tablet Take 1 tablet (100 mg total) by mouth 2 (two) times daily. 08/03/17   Norval Gable, MD  meclizine (ANTIVERT) 12.5 MG tablet Take 12.5 mg by mouth 3 (three) times daily as needed for dizziness.    [provider]  methocarbamol (ROBAXIN) 500 MG tablet Take 1 tablet (500 mg total) by mouth every 6 (six) hours as needed for muscle spasms. 09/10/16   Erline Levine, MD  oxyCODONE-acetaminophen (PERCOCET/ROXICET) 5-325 MG tablet Take 1-2 tablets by mouth every 4 (four) hours as needed for moderate pain or severe pain. 12/18/16   Toni Arthurs, NP  senna (SENOKOT) 8.6 MG tablet Take 2 tablets by mouth 2 (two) times daily.    [provider]    Family History Family History  Problem Relation Age of Onset  . Cancer Mother        breast  . Breast cancer Mother 61  . Cancer Brother   . Cancer Sister        pancreatic  . Suicidality Father   . Breast cancer Sister        x 2 times  . Heart attack Sister     Social History Social History   Tobacco Use  . Smoking status: Never Smoker  . Smokeless tobacco: Never Used  Substance Use Topics  . Alcohol use: No    Comment: socially  . Drug use: No     Allergies   Macrodantin [nitrofurantoin macrocrystal]; Penicillins; Celebrex  [celecoxib]; and Flagyl [metronidazole hcl]   Review of Systems Review of Systems   Physical Exam Triage Vital Signs ED Triage Vitals  Enc Vitals Group     BP 08/03/17 1218 (!) 153/87     Pulse Rate 08/03/17 1218 75     Resp 08/03/17 1218 16     Temp 08/03/17 1218 98.1 F (36.7 C)  Temp Source 08/03/17 1218 Oral     SpO2 08/03/17 1218 100 %     Weight 08/03/17 1218 172 lb (78 kg)     Height 08/03/17 1218 5' 3.5" (1.613 m)     Head Circumference --      Peak Flow --      Pain Score 08/03/17 1217 0     Pain Loc --      Pain Edu? --      Excl. in [place]? --    No data found.  Updated Vital Signs BP (!) 153/87 (BP Location: Left Arm)   Pulse 75   Temp 98.1 F (36.7 C) (Oral)   Resp 16   Ht 5' 3.5" (1.613 m)   Wt 172 lb (78 kg)   SpO2 100%   BMI 29.99 kg/m   Visual Acuity Right Eye Distance:   Left Eye Distance:   Bilateral Distance:    Right Eye Near:   Left Eye Near:    Bilateral Near:     Physical Exam  Constitutional: She appears well-developed and well-nourished. No distress.  Skin: She is not diaphoretic. There is erythema (4x3cm oval, circumscribed skin area on left buttock with blanchanble erythema, warmth and tenderness; with pinpoint puncture wound).  Nursing note and vitals reviewed.    UC Treatments / Results  Labs (all labs ordered are listed, but only abnormal results are displayed) Labs Reviewed - No data to display  EKG None  Radiology No results found.  Procedures Procedures (including critical care time)  Medications Ordered in UC Medications - No data to display  Initial Impression / Assessment and Plan / UC Course  I have reviewed the triage vital signs and the nursing notes.  Pertinent labs & imaging results that were available during my care of the patient were reviewed by me and considered in my medical decision making (see chart for details).      Final Clinical Impressions(s) / UC Diagnoses   Final diagnoses:    Tick bite, initial encounter  Cellulitis of buttock     Discharge Instructions     Warm compresses to area    ED Prescriptions    Medication Sig Dispense Auth. Provider   doxycycline (VIBRA-TABS) 100 MG tablet Take 1 tablet (100 mg total) by mouth 2 (two) times daily. 20 tablet Norval Gable, MD      1. diagnosis reviewed with patient 2. rx as per orders above; reviewed possible side effects, interactions, risks and benefits  3. Recommend supportive treatment as above 4. Follow-up prn if symptoms worsen or don't improve   Controlled Substance Prescriptions [place] Controlled Substance Registry consulted? Not Applicable   Norval Gable, MD 08/03/17 1357

## 2017-08-03 NOTE — Discharge Instructions (Signed)
Warm compresses to area °

## 2017-08-18 ENCOUNTER — Encounter (INDEPENDENT_AMBULATORY_CARE_PROVIDER_SITE_OTHER): Payer: Self-pay | Admitting: Vascular Surgery

## 2017-08-18 ENCOUNTER — Encounter: Payer: Self-pay | Admitting: *Deleted

## 2017-08-18 ENCOUNTER — Ambulatory Visit (INDEPENDENT_AMBULATORY_CARE_PROVIDER_SITE_OTHER): Payer: Medicare Other | Admitting: Vascular Surgery

## 2017-08-18 VITALS — BP 146/82 | HR 72 | Resp 14 | Ht 63.5 in | Wt 174.0 lb

## 2017-08-18 DIAGNOSIS — M4126 Other idiopathic scoliosis, lumbar region: Secondary | ICD-10-CM

## 2017-08-18 DIAGNOSIS — M79604 Pain in right leg: Secondary | ICD-10-CM

## 2017-08-18 DIAGNOSIS — I10 Essential (primary) hypertension: Secondary | ICD-10-CM

## 2017-08-18 DIAGNOSIS — M7989 Other specified soft tissue disorders: Secondary | ICD-10-CM

## 2017-08-18 DIAGNOSIS — M79605 Pain in left leg: Secondary | ICD-10-CM

## 2017-08-18 NOTE — Progress Notes (Signed)
Patient ID: Latoya Stevenson, female   DOB: 08-20-1940, 77 y.o.   MRN: [id number]  Chief Complaint  Patient presents with  . New Patient (Initial Visit)    Varicose veins and Edema    HPI Latoya Stevenson is a 77 y.o. female.  I am asked to see the patient by Dr. Neldon Mc for evaluation of leg swelling and pain.  The patient presents with complaints of symptomatic varicosities of the legs for some time. The patient reports a long standing history of varicosities and they have become painful over time. There was no clear inciting event or causative factor that started the symptoms.  The right leg is slightly more severly affected. The patient elevates the legs for relief. The pain is described as heaviness and aching. The symptoms are generally most severe in the evening, particularly when they have been on their feet for long periods of time. Elevation has been used to try to improve the symptoms with limited success. The swelling is more prominent problem that the pain. The patient has no previous history of deep venous thrombosis or superficial thrombophlebitis to their knowledge.     Past Medical History:  Diagnosis Date  . Allergic rhinitis   . Anemia    low iron  . Arthritis   . Asthma without status asthmaticus    unspecified  . Bilateral sciatica   . Bronchitis   . Cancer ([place])     follicular variant papillary thyroid cancer  . Chronic back pain    unspecified  . Chronic kidney disease   . Colon polyp    adenomatous  . DDD (degenerative disc disease), lumbar   . DJD (degenerative joint disease)   . Dysphagia   . Dyspnea   . Environmental allergies   . GERD (gastroesophageal reflux disease)   . Headache    migraines as a young woman  . History of recurrent UTIs   . Hyperlipidemia   . Hypertension   . Hypothyroidism   . IBS (irritable bowel syndrome)   . Lumbar disc disease   . Mold exposure 2002   hx of   . Osteoporosis, post-menopausal   . Pneumonia 1978  .  Restless legs   . Scoliosis   . Spinal stenosis   . Thyroid nodule    aspirated in 1997 and 2003 which were negative  . Ulcer   . Varicose veins of left lower extremity   . Vertigo   . Wears dentures     Past Surgical History:  Procedure Laterality Date  . ABDOMINAL HYSTERECTOMY  1977   Dr. Laverta Baltimore  . ANKLE FRACTURE SURGERY Left 2003  . ANTERIOR LAT LUMBAR FUSION Left 09/09/2016   Procedure: Left Lumbar two-three Anterolateral lumbar interbody fusion with lateral plate;  Surgeon: Erline Levine, MD;  Location: [facility];  Service: Neurosurgery;  Laterality: Left;  Left L2-3 Anterolateral lumbar interbody fusion with lateral plate  . APPENDECTOMY    . BIOPSY THYROID     x2  thyroid nodule ; Dr. Sharlet Salina and one by Dr. Sabra Heck  . BREAST CYST ASPIRATION  1970's  . broken arm  1994   wrist fracture surgery, left  . CARDIOVASCULAR STRESS TEST     2010  . COLONOSCOPY  2010  . COLONOSCOPY  07/21/2013   PHX OF CP/REPEAT 77YRS/[place]  . EYE SURGERY Bilateral    cataract surgery with lens implant  . LUMBAR LAMINECTOMY/DECOMPRESSION MICRODISCECTOMY  11/18/2011   Procedure: LUMBAR LAMINECTOMY/DECOMPRESSION MICRODISCECTOMY 2 LEVELS;  Surgeon: Erline Levine, MD;  Location: [facility];  Service: Neurosurgery;  Laterality: N/A;  Lumbar Three-Four,  Lumbar Four-Five Decompressive Laminectomy  . OVARIAN CYST SURGERY    . POSTERIOR FUSION LUMBAR SPINE  2016  . TOTAL THYROIDECTOMY    . TRANSTHORACIC ECHOCARDIOGRAM  2010  . upper and lower GI  2010    Family History  Problem Relation Age of Onset  . Cancer Mother        breast  . Breast cancer Mother 17  . Cancer Brother   . Cancer Sister        pancreatic  . Suicidality Father   . Breast cancer Sister        x 2 times  . Heart attack Sister     Social History Social History   Tobacco Use  . Smoking status: Never Smoker  . Smokeless tobacco: Never Used  Substance Use Topics  . Alcohol use: No    Comment: socially  . Drug use: No     Allergies  Allergen Reactions  . Macrodantin [Nitrofurantoin Macrocrystal] Anaphylaxis, Hives and Swelling  . Penicillins Anaphylaxis and Swelling    PATIENT HAD A PCN REACTION WITH IMMEDIATE RASH, FACIAL/TONGUE/THROAT SWELLING, SOB, OR LIGHTHEADEDNESS WITH HYPOTENSION:  #  #  #  YES  #  #  #   Has patient had a PCN reaction causing severe rash involving mucus membranes or skin necrosis: No Has patient had a PCN reaction that required hospitalization: No Has patient had a PCN reaction occurring within the last 10 years: No If all of the above answers are "NO", then may proceed with Cephalosporin use.   . Celebrex [Celecoxib] Swelling    SWELLING REACTION UNSPECIFIED   . Flagyl [Metronidazole Hcl] Swelling    SWELLING REACTION UNSPECIFIED     Current Outpatient Medications  Medication Sig Dispense Refill  . amLODipine (NORVASC) 5 MG tablet Take 5 mg by mouth at bedtime.     Marland Kitchen atorvastatin (LIPITOR) 40 MG tablet Take 40 mg by mouth at bedtime.     . Calcium Carbonate-Vitamin D (CALCIUM 600+D) 600-400 MG-UNIT tablet Take 2 tablets by mouth daily.    . Cholecalciferol (VITAMIN D-3) 5000 UNITS TABS Take 5,000 Units by mouth daily.     . diphenhydrAMINE (SOMINEX) 25 MG tablet Take 25 mg by mouth at bedtime as needed for sleep. For sleep    . doxycycline (VIBRA-TABS) 100 MG tablet Take 1 tablet (100 mg total) by mouth 2 (two) times daily. 20 tablet 0  . doxylamine, Sleep, (UNISOM) 25 MG tablet Take 25 mg by mouth at bedtime.     . fexofenadine (ALLEGRA) 180 MG tablet Take 180 mg by mouth at bedtime.     . fluticasone (FLONASE) 50 MCG/ACT nasal spray Place 2 sprays into both nostrils at bedtime.     . Glucosamine-Chondroitin 750-600 MG TABS Take 1 tablet by mouth 2 (two) times daily.    . hydrochlorothiazide (HYDRODIURIL) 12.5 MG tablet Take by mouth.    . irbesartan (AVAPRO) 300 MG tablet Take 300 mg by mouth daily.     Marland Kitchen levothyroxine (SYNTHROID, LEVOTHROID) 150 MCG tablet Take 150 mcg  by mouth daily before breakfast.     . meclizine (ANTIVERT) 12.5 MG tablet Take 12.5 mg by mouth 3 (three) times daily as needed for dizziness.    . methocarbamol (ROBAXIN) 500 MG tablet Take 1 tablet (500 mg total) by mouth every 6 (six) hours as needed for muscle spasms. 60 tablet 1  .  Multiple Vitamin (MULTIVITAMIN WITH MINERALS) TABS Take 1 tablet by mouth daily.    . nabumetone (RELAFEN) 500 MG tablet TAKE 1 TABLET BY MOUTH TWICE DAILY    . omeprazole (PRILOSEC) 20 MG capsule Take 20 mg by mouth daily before breakfast.     . oxyCODONE-acetaminophen (PERCOCET/ROXICET) 5-325 MG tablet Take 1-2 tablets by mouth every 4 (four) hours as needed for moderate pain or severe pain. 120 tablet 0  . senna (SENOKOT) 8.6 MG tablet Take 2 tablets by mouth 2 (two) times daily.    . vitamin E 400 UNIT capsule Take 400 Units by mouth daily.       No current facility-administered medications for this visit.       REVIEW OF SYSTEMS (Negative unless checked)  Constitutional: [] Weight loss  [] Fever  [] Chills Cardiac: [] Chest pain   [] Chest pressure   [] Palpitations   [] Shortness of breath when laying flat   [] Shortness of breath at rest   [x] Shortness of breath with exertion. Vascular:  [] Pain in legs with walking   [] Pain in legs at rest   [] Pain in legs when laying flat   [] Claudication   [] Pain in feet when walking  [] Pain in feet at rest  [] Pain in feet when laying flat   [] History of DVT   [] Phlebitis   [x] Swelling in legs   [x] Varicose veins   [] Non-healing ulcers Pulmonary:   [] Uses home oxygen   [] Productive cough   [] Hemoptysis   [] Wheeze  [] COPD   [] Asthma Neurologic:  [] Dizziness  [] Blackouts   [] Seizures   [] History of stroke   [] History of TIA  [] Aphasia   [] Temporary blindness   [] Dysphagia   [] Weakness or numbness in arms   [] Weakness or numbness in legs Musculoskeletal:  [x] Arthritis   [] Joint swelling   [x] Joint pain   [x] Low back pain Hematologic:  [] Easy bruising  [] Easy bleeding    [] Hypercoagulable state   [] Anemic  [] Hepatitis Gastrointestinal:  [] Blood in stool   [] Vomiting blood  [x] Gastroesophageal reflux/heartburn   [] Abdominal pain Genitourinary:  [] Chronic kidney disease   [] Difficult urination  [] Frequent urination  [] Burning with urination   [] Hematuria Skin:  [] Rashes   [] Ulcers   [] Wounds Psychological:  [] History of anxiety   []  History of major depression.    Physical Exam BP (!) 146/82 (BP Location: Right Arm, Patient Position: Sitting)   Pulse 72   Resp 14   Ht 5' 3.5" (1.613 m)   Wt 174 lb (78.9 kg)   BMI 30.34 kg/m  Gen:  WD/WN, NAD Head: [place]/AT, No temporalis wasting.  Ear/Nose/Throat: Hearing grossly intact, dentition good Eyes: Sclera non-icteric. Conjunctiva clear Neck: Supple. Trachea midline Pulmonary:  Good air movement, no use of accessory muscles, respirations not labored.  Cardiac: RRR, No JVD Vascular: Varicosities diffuse and measuring up to 2 mm in the right lower extremity        Varicosities diffuse and measuring up to 2-3 mm in the left lower extremity Vessel Right Left  Radial Palpable Palpable                          PT 1+ Palpable 1+ Palpable  DP Palpable Palpable   Gastrointestinal: soft, non-tender/non-distended.  Musculoskeletal: M/S 5/5 throughout.   1 + RLE edema.  1 + LLE edema Neurologic: Sensation grossly intact in extremities.  Symmetrical.  Speech is fluent.  Psychiatric: Judgment intact, Mood & affect appropriate for pt's clinical situation. Dermatologic: No rashes or ulcers noted.  No cellulitis or open wounds.    Radiology No results found.  Labs No results found for this or any previous visit (from the past 2160 hour(s)).  Assessment/Plan:  Hypertension blood pressure control important in reducing the progression of atherosclerotic disease. On appropriate oral medications.   Idiopathic scoliosis of lumbar region Neuropathic symptoms from this could be contributing to lower extremity  pain  Swelling of limb Recommend use of compression stockings, elevation, and increasing her activity.  Venous work-up as below.  May also benefit from a lymphedema pump going forward.  Pain in limb   The patient has symptoms consistent with chronic venous insufficiency. We discussed the natural history and treatment options for venous disease. I recommended the regular use of 20 - 30 mm Hg compression stockings, and prescribed these today. I recommended leg elevation and anti-inflammatories as needed for pain. I have also recommended a complete venous duplex to assess the venous system for reflux or thrombotic issues. This can be done at the patient's convenience. I will see the patient back after their study to assess the response to conservative management, and determine further treatment options.  We discussed secondary options such as a lymphedema pump as adjuvant therapy as well.     Leotis Pain 08/19/2017, 3:12 PM   This note was created with Dragon medical transcription system.  Any errors from dictation are unintentional.

## 2017-08-19 ENCOUNTER — Ambulatory Visit
Admission: RE | Admit: 2017-08-19 | Payer: Medicare Other | Source: Ambulatory Visit | Admitting: Unknown Physician Specialty

## 2017-08-19 ENCOUNTER — Encounter: Payer: Self-pay | Admitting: Anesthesiology

## 2017-08-19 ENCOUNTER — Encounter: Admission: RE | Payer: Self-pay | Source: Ambulatory Visit

## 2017-08-19 DIAGNOSIS — M7989 Other specified soft tissue disorders: Secondary | ICD-10-CM | POA: Insufficient documentation

## 2017-08-19 DIAGNOSIS — I10 Essential (primary) hypertension: Secondary | ICD-10-CM | POA: Insufficient documentation

## 2017-08-19 DIAGNOSIS — M79609 Pain in unspecified limb: Secondary | ICD-10-CM | POA: Insufficient documentation

## 2017-08-19 HISTORY — DX: Other intervertebral disc degeneration, lumbar region without mention of lumbar back pain or lower extremity pain: M51.369

## 2017-08-19 HISTORY — DX: Other intervertebral disc degeneration, lumbar region: M51.36

## 2017-08-19 HISTORY — DX: Chronic kidney disease, unspecified: N18.9

## 2017-08-19 HISTORY — DX: Dysphagia, unspecified: R13.10

## 2017-08-19 HISTORY — DX: Sciatica, right side: M54.31

## 2017-08-19 HISTORY — DX: Asymptomatic varicose veins of left lower extremity: I83.92

## 2017-08-19 HISTORY — DX: Spinal stenosis, site unspecified: M48.00

## 2017-08-19 HISTORY — DX: Sciatica, right side: M54.32

## 2017-08-19 HISTORY — DX: Dyspnea, unspecified: R06.00

## 2017-08-19 SURGERY — ESOPHAGOGASTRODUODENOSCOPY (EGD) WITH PROPOFOL
Anesthesia: General

## 2017-08-19 MED ORDER — FENTANYL CITRATE (PF) 100 MCG/2ML IJ SOLN
INTRAMUSCULAR | Status: AC
  Filled 2017-08-19: qty 2

## 2017-08-19 MED ORDER — PROPOFOL 500 MG/50ML IV EMUL
INTRAVENOUS | Status: AC
Start: 2017-08-19 — End: ?
  Filled 2017-08-19: qty 50

## 2017-08-19 NOTE — Patient Instructions (Signed)
Edema Edema is an abnormal buildup of fluids in your bodytissues. Edema is somewhatdependent on gravity to pull the fluid to the lowest place in your body. That makes the condition more common in the legs and thighs (lower extremities). Painless swelling of the feet and ankles is common and becomes more likely as you get older. It is also common in looser tissues, like around your eyes. When the affected area is squeezed, the fluid may move out of that spot and leave a dent for a few moments. This dent is called pitting. What are the causes? There are many possible causes of edema. Eating too much salt and being on your feet or sitting for a long time can cause edema in your legs and ankles. Hot weather may make edema worse. Common medical causes of edema include:  Heart failure.  Liver disease.  Kidney disease.  Weak blood vessels in your legs.  Cancer.  An injury.  Pregnancy.  Some medications.  Obesity.  What are the signs or symptoms? Edema is usually painless.Your skin may look swollen or shiny. How is this diagnosed? Your health care provider may be able to diagnose edema by asking about your medical history and doing a physical exam. You may need to have tests such as X-rays, an electrocardiogram, or blood tests to check for medical conditions that may cause edema. How is this treated? Edema treatment depends on the cause. If you have heart, liver, or kidney disease, you need the treatment appropriate for these conditions. General treatment may include:  Elevation of the affected body part above the level of your heart.  Compression of the affected body part. Pressure from elastic bandages or support stockings squeezes the tissues and forces fluid back into the blood vessels. This keeps fluid from entering the tissues.  Restriction of fluid and salt intake.  Use of a water pill (diuretic). These medications are appropriate only for some types of edema. They pull fluid  out of your body and make you urinate more often. This gets rid of fluid and reduces swelling, but diuretics can have side effects. Only use diuretics as directed by your health care provider.  Follow these instructions at home:  Keep the affected body part above the level of your heart when you are lying down.  Do not sit still or stand for prolonged periods.  Do not put anything directly under your knees when lying down.  Do not wear constricting clothing or garters on your upper legs.  Exercise your legs to work the fluid back into your blood vessels. This may help the swelling go down.  Wear elastic bandages or support stockings to reduce ankle swelling as directed by your health care provider.  Eat a low-salt diet to reduce fluid if your health care provider recommends it.  Only take medicines as directed by your health care provider. Contact a health care provider if:  Your edema is not responding to treatment.  You have heart, liver, or kidney disease and notice symptoms of edema.  You have edema in your legs that does not improve after elevating them.  You have sudden and unexplained weight gain. Get help right away if:  You develop shortness of breath or chest pain.  You cannot breathe when you lie down.  You develop pain, redness, or warmth in the swollen areas.  You have heart, liver, or kidney disease and suddenly get edema.  You have a fever and your symptoms suddenly get worse. This information is   not intended to replace advice given to you by your health care provider. Make sure you discuss any questions you have with your health care provider. Document Released: 03/03/2005 Document Revised: 08/09/2015 Document Reviewed: 12/24/2012 Elsevier Interactive Patient Education  2017 [employer]  

## 2017-08-19 NOTE — Assessment & Plan Note (Signed)
Recommend use of compression stockings, elevation, and increasing her activity.  Venous work-up as below.  May also benefit from a lymphedema pump going forward.

## 2017-08-19 NOTE — Assessment & Plan Note (Signed)
Neuropathic symptoms from this could be contributing to lower extremity pain

## 2017-08-19 NOTE — Assessment & Plan Note (Signed)
blood pressure control important in reducing the progression of atherosclerotic disease. On appropriate oral medications.  

## 2017-10-14 ENCOUNTER — Encounter (INDEPENDENT_AMBULATORY_CARE_PROVIDER_SITE_OTHER): Payer: Self-pay | Admitting: Vascular Surgery

## 2017-10-14 ENCOUNTER — Ambulatory Visit (INDEPENDENT_AMBULATORY_CARE_PROVIDER_SITE_OTHER): Payer: Medicare Other

## 2017-10-14 ENCOUNTER — Ambulatory Visit (INDEPENDENT_AMBULATORY_CARE_PROVIDER_SITE_OTHER): Payer: Medicare Other | Admitting: Nurse Practitioner

## 2017-10-14 VITALS — BP 155/86 | HR 68 | Resp 13 | Ht 63.5 in | Wt 175.0 lb

## 2017-10-14 DIAGNOSIS — I890 Lymphedema, not elsewhere classified: Secondary | ICD-10-CM

## 2017-10-14 DIAGNOSIS — M79605 Pain in left leg: Secondary | ICD-10-CM

## 2017-10-14 DIAGNOSIS — M79604 Pain in right leg: Secondary | ICD-10-CM

## 2017-10-14 DIAGNOSIS — R600 Localized edema: Secondary | ICD-10-CM

## 2017-10-14 DIAGNOSIS — M7989 Other specified soft tissue disorders: Secondary | ICD-10-CM

## 2017-10-14 NOTE — Progress Notes (Signed)
Subjective:    Patient ID: Latoya Stevenson, female    DOB: Jun 21, 1940, 77 y.o.   MRN: [id number] Chief Complaint  Patient presents with  . Follow-up    Venous reflux f/u    HPI  The patient returns to the office for followup evaluation regarding leg swelling.  The swelling has persisted and the pain associated with swelling continues. There have not been any interval development of a ulcerations or wounds.  Since the previous visit the patient has not  been wearing graduated compression stockings as recommended and has noted little if any improvement in the lymphedema.  The patient also states elevation during the day and exercise is being done too.  The patient denies any nausea, fever, vomiting, and diarrhea.  She denies any ulcerations, wounds, or weeping from legs.  She does endorse a tightness feeling.  The patient underwent a reflux study of her bilateral lower extremities.  Her right leg found abnormal reflux times in the common femoral vein however there is no evidence of DVT as well as no evidence of reflux within the superficial system.  Her left however there is no evidence of DVT or evidence of reflux throughout her leg.     Constitutional: [] Weight loss  [] Fever  [] Chills Cardiac: [] Chest pain   [] Chest pressure   [] Palpitations   [] Shortness of breath when laying flat   [] Shortness of breath with exertion. Vascular:  [] Pain in legs with walking   [] Pain in legs with standing  [] History of DVT   [] Phlebitis   [x] Swelling in legs   [] Varicose veins   [] Non-healing ulcers Pulmonary:   [] Uses home oxygen   [] Productive cough   [] Hemoptysis   [] Wheeze  [] COPD   [] Asthma Neurologic:  [] Dizziness   [] Seizures   [] History of stroke   [] History of TIA  [] Aphasia   [] Vissual changes   [] Weakness or numbness in arm   [] Weakness or numbness in leg Musculoskeletal:   [] Joint swelling   [] Joint pain   [] Low back pain Hematologic:  [] Easy bruising  [] Easy bleeding   [] Hypercoagulable  state   [] Anemic Gastrointestinal:  [] Diarrhea   [] Vomiting  [] Gastroesophageal reflux/heartburn   [] Difficulty swallowing. Genitourinary:  [] Chronic kidney disease   [] Difficult urination  [] Frequent urination   [] Blood in urine Skin:  [] Rashes   [] Ulcers  Psychological:  [] History of anxiety   []  History of major depression.     Objective:   Physical Exam  BP (!) 155/86 (BP Location: Left Arm, Patient Position: Sitting)   Pulse 68   Resp 13   Ht 5' 3.5" (1.613 m)   Wt 175 lb (79.4 kg)   BMI 30.51 kg/m   Past Medical History:  Diagnosis Date  . Allergic rhinitis   . Anemia    low iron  . Arthritis   . Asthma without status asthmaticus    unspecified  . Bilateral sciatica   . Bronchitis   . Cancer ([place])     follicular variant papillary thyroid cancer  . Chronic back pain    unspecified  . Chronic kidney disease   . Colon polyp    adenomatous  . DDD (degenerative disc disease), lumbar   . DJD (degenerative joint disease)   . Dysphagia   . Dyspnea   . Environmental allergies   . GERD (gastroesophageal reflux disease)   . Headache    migraines as a young woman  . History of recurrent UTIs   . Hyperlipidemia   . Hypertension   .  Hypothyroidism   . IBS (irritable bowel syndrome)   . Lumbar disc disease   . Mold exposure 2002   hx of   . Osteoporosis, post-menopausal   . Pneumonia 1978  . Restless legs   . Scoliosis   . Spinal stenosis   . Thyroid nodule    aspirated in 1997 and 2003 which were negative  . Ulcer   . Varicose veins of left lower extremity   . Vertigo   . Wears dentures      Gen: WD/WN, NAD Head: [place]/AT, No temporalis wasting.  Ear/Nose/Throat: Hearing grossly intact, nares w/o erythema or drainage, poor dentition Eyes: PER, EOMI, sclera nonicteric.  Neck: Supple, no masses.  No bruit or JVD.  Pulmonary:  Good air movement, clear to auscultation bilaterally, no use of accessory muscles.  Cardiac: RRR, normal S1, S2, no Murmurs. Vascular:    Mild venous stasis changes to the legs bilaterally.  3+ pitting edema Vessel Right Left  Radial 2+ 2+  Brachial    Femoral    Popliteal    PT    DP    Carotid    Gastrointestinal: soft, non-distended. No guarding/no peritoneal signs.  Musculoskeletal: M/S 5/5 throughout.  No deformity or atrophy.  Neurologic: CN 2-12 intact. Pain and light touch intact in extremities.  Symmetrical.  Speech is fluent. Motor exam as listed above. Psychiatric: Judgment intact, Mood & affect appropriate for pt's clinical situation. Dermatologic: Venous rashes no ulcers noted.  No changes consistent with cellulitis. Lymph :  Evidence of stage 2 lymphedema, with pitting edema and dermal thickening   Social History   Socioeconomic History  . Marital status: Widowed    Spouse name: Not on file  . Number of children: Not on file  . Years of education: Not on file  . Highest education level: Not on file  Occupational History  . Not on file  Social Needs  . Financial resource strain: Not on file  . Food insecurity:    Worry: Not on file    Inability: Not on file  . Transportation needs:    Medical: Not on file    Non-medical: Not on file  Tobacco Use  . Smoking status: Never Smoker  . Smokeless tobacco: Never Used  Substance and Sexual Activity  . Alcohol use: No    Comment: socially  . Drug use: No  . Sexual activity: Not on file  Lifestyle  . Physical activity:    Days per week: Not on file    Minutes per session: Not on file  . Stress: Not on file  Relationships  . Social connections:    Talks on phone: Not on file    Gets together: Not on file    Attends religious service: Not on file    Active member of club or organization: Not on file    Attends meetings of clubs or organizations: Not on file    Relationship status: Not on file  . Intimate partner violence:    Fear of current or ex partner: Not on file    Emotionally abused: Not on file    Physically abused: Not on file     Forced sexual activity: Not on file  Other Topics Concern  . Not on file  Social History Narrative  . Not on file    Past Surgical History:  Procedure Laterality Date  . ABDOMINAL HYSTERECTOMY  1977   Dr. Laverta Baltimore  . ANKLE FRACTURE SURGERY Left 2003  . ANTERIOR LAT  LUMBAR FUSION Left 09/09/2016   Procedure: Left Lumbar two-three Anterolateral lumbar interbody fusion with lateral plate;  Surgeon: Erline Levine, MD;  Location: [facility];  Service: Neurosurgery;  Laterality: Left;  Left L2-3 Anterolateral lumbar interbody fusion with lateral plate  . APPENDECTOMY    . BIOPSY THYROID     x2  thyroid nodule ; Dr. Sharlet Salina and one by Dr. Sabra Heck  . BREAST CYST ASPIRATION  1970's  . broken arm  1994   wrist fracture surgery, left  . CARDIOVASCULAR STRESS TEST     2010  . COLONOSCOPY  2010  . COLONOSCOPY  07/21/2013   PHX OF CP/REPEAT 7YRS/[place]  . EYE SURGERY Bilateral    cataract surgery with lens implant  . LUMBAR LAMINECTOMY/DECOMPRESSION MICRODISCECTOMY  11/18/2011   Procedure: LUMBAR LAMINECTOMY/DECOMPRESSION MICRODISCECTOMY 2 LEVELS;  Surgeon: Erline Levine, MD;  Location: [facility];  Service: Neurosurgery;  Laterality: N/A;  Lumbar Three-Four,  Lumbar Four-Five Decompressive Laminectomy  . OVARIAN CYST SURGERY    . POSTERIOR FUSION LUMBAR SPINE  2016  . TOTAL THYROIDECTOMY    . TRANSTHORACIC ECHOCARDIOGRAM  2010  . upper and lower GI  2010    Family History  Problem Relation Age of Onset  . Cancer Mother        breast  . Breast cancer Mother 65  . Cancer Brother   . Cancer Sister        pancreatic  . Suicidality Father   . Breast cancer Sister        x 2 times  . Heart attack Sister     Allergies  Allergen Reactions  . Macrodantin [Nitrofurantoin Macrocrystal] Anaphylaxis, Hives and Swelling  . Penicillins Anaphylaxis and Swelling    PATIENT HAD A PCN REACTION WITH IMMEDIATE RASH, FACIAL/TONGUE/THROAT SWELLING, SOB, OR LIGHTHEADEDNESS WITH HYPOTENSION:  #  #  #  YES  #  #  #    Has patient had a PCN reaction causing severe rash involving mucus membranes or skin necrosis: No Has patient had a PCN reaction that required hospitalization: No Has patient had a PCN reaction occurring within the last 10 years: No If all of the above answers are "NO", then may proceed with Cephalosporin use.   . Celebrex [Celecoxib] Swelling    SWELLING REACTION UNSPECIFIED   . Flagyl [Metronidazole Hcl] Swelling    SWELLING REACTION UNSPECIFIED   . Furosemide Itching       Assessment & Plan:   The patient returns to the office for followup evaluation regarding leg swelling.  The swelling has persisted and the pain associated with swelling continues. There have not been any interval development of a ulcerations or wounds.  Since the previous visit the patient has not  been wearing graduated compression stockings as recommended and has noted little if any improvement in the lymphedema.  The patient also states elevation during the day and exercise is being done too.  The patient denies any nausea, fever, vomiting, and diarrhea.  She denies any ulcerations, wounds, or weeping from legs.  She does endorse a tightness feeling.  The patient underwent a reflux study of her bilateral lower extremities.  Her right leg found abnormal reflux times in the common femoral vein however there is no evidence of DVT as well as no evidence of reflux within the superficial system.  Her left however there is no evidence of DVT or evidence of reflux throughout her leg.  1. Lymphedema-new No surgery or intervention at this point in time.  I have had a long discussion with the patient regarding lymphedema and why it  causes symptoms, specifically venous ulceration . I have discussed with the patient the chronic skin changes that accompany lymphedema and the long term sequela such as infection and recurring  ulceration.  Patient's legs are extensively swollen and would not tolerate traditional medical grade  compression 1 stockings, so will place in UNNA boots to gain control of swelling, with intention of transitioning to compression stockings. 3 layer bilateral zinc oxide unna wrap to the bilateral lower extremity, changed weekly for one month.  In addition, behavioral modification including several periods of elevation of the lower extremities during the day will be continued. Achieving a position with the ankles at heart level was stressed to the patient  The patient is instructed to begin routine exercise, especially walking on a daily basis  Patient will be placed in unna boots for four weeks, at which time we will check on swelling progression.    2. Pain in both lower extremities-stable Likely caused by excessive swelling in BLE, see above.   However patient does have multiple risk factors for peripheral arterial disease, will check ABI when she returns in four weeks.    Current Outpatient Medications on File Prior to Visit  Medication Sig Dispense Refill  . amLODipine (NORVASC) 5 MG tablet Take 5 mg by mouth at bedtime.     Marland Kitchen atorvastatin (LIPITOR) 40 MG tablet Take 40 mg by mouth at bedtime.     . Calcium Carbonate-Vitamin D (CALCIUM 600+D) 600-400 MG-UNIT tablet Take 2 tablets by mouth daily.    . chlorthalidone (HYGROTON) 25 MG tablet Take 25 mg by mouth daily.  1  . Cholecalciferol (VITAMIN D-3) 5000 UNITS TABS Take 5,000 Units by mouth daily.     . diphenhydrAMINE (SOMINEX) 25 MG tablet Take 25 mg by mouth at bedtime as needed for sleep. For sleep    . doxylamine, Sleep, (UNISOM) 25 MG tablet Take 25 mg by mouth at bedtime.     . fexofenadine (ALLEGRA) 180 MG tablet Take 180 mg by mouth at bedtime.     . fluticasone (FLONASE) 50 MCG/ACT nasal spray Place 2 sprays into both nostrils at bedtime.     Marland Kitchen gentamicin ointment (GARAMYCIN) 0.1 % APPLY THIN LAYER TWICE DAILY TO HEALING WOUND WITH DRESSING CHANGES  0  . Glucosamine-Chondroitin 750-600 MG TABS Take 1 tablet by mouth 2 (two)  times daily.    . hydrochlorothiazide (HYDRODIURIL) 12.5 MG tablet Take by mouth.    . irbesartan (AVAPRO) 300 MG tablet Take 300 mg by mouth daily.     Marland Kitchen levothyroxine (SYNTHROID, LEVOTHROID) 150 MCG tablet Take 150 mcg by mouth daily before breakfast.     . meclizine (ANTIVERT) 12.5 MG tablet Take 12.5 mg by mouth 3 (three) times daily as needed for dizziness.    . methocarbamol (ROBAXIN) 500 MG tablet Take 1 tablet (500 mg total) by mouth every 6 (six) hours as needed for muscle spasms. 60 tablet 1  . Multiple Vitamin (MULTIVITAMIN WITH MINERALS) TABS Take 1 tablet by mouth daily.    . nabumetone (RELAFEN) 500 MG tablet TAKE 1 TABLET BY MOUTH TWICE DAILY    . omeprazole (PRILOSEC) 20 MG capsule Take 20 mg by mouth daily before breakfast.     . oxyCODONE (OXY IR/ROXICODONE) 5 MG immediate release tablet TAKE 1 TABLET BY MOUTH EVERY 6 TO 8 HOURS AS NEEDED FOR PAIN  0  . senna (SENOKOT) 8.6 MG tablet  Take 2 tablets by mouth 2 (two) times daily.    . Vitamin D, Ergocalciferol, (DRISDOL) 50000 units CAPS capsule Take by mouth.    . vitamin E 400 UNIT capsule Take 400 Units by mouth daily.      Marland Kitchen doxycycline (VIBRA-TABS) 100 MG tablet Take 1 tablet (100 mg total) by mouth 2 (two) times daily. (Patient not taking: Reported on 10/14/2017) 20 tablet 0  . furosemide (LASIX) 20 MG tablet Take 20 mg by mouth daily.  11   No current facility-administered medications on file prior to visit.     There are no Patient Instructions on file for this visit. No follow-ups on file.   Kris Hartmann, NP

## 2017-10-20 ENCOUNTER — Ambulatory Visit (INDEPENDENT_AMBULATORY_CARE_PROVIDER_SITE_OTHER): Payer: Medicare Other | Admitting: Nurse Practitioner

## 2017-10-20 ENCOUNTER — Encounter (INDEPENDENT_AMBULATORY_CARE_PROVIDER_SITE_OTHER): Payer: Self-pay

## 2017-10-20 VITALS — BP 129/81 | HR 81 | Resp 13 | Ht 62.0 in | Wt 174.0 lb

## 2017-10-20 DIAGNOSIS — I890 Lymphedema, not elsewhere classified: Secondary | ICD-10-CM | POA: Diagnosis not present

## 2017-10-20 NOTE — Progress Notes (Signed)
History of Present Illness  There is no documented history at this time  Assessments & Plan   There are no diagnoses linked to this encounter.    Additional instructions  Subjective:  Patient presents with venous ulcer of the Bilateral lower extremity.    Procedure:  3 layer unna wrap was placed Bilateral lower extremity.   Plan:   Follow up in one week.  

## 2017-10-21 ENCOUNTER — Encounter (INDEPENDENT_AMBULATORY_CARE_PROVIDER_SITE_OTHER): Payer: Medicare Other

## 2017-10-22 ENCOUNTER — Encounter (INDEPENDENT_AMBULATORY_CARE_PROVIDER_SITE_OTHER): Payer: Medicare Other

## 2017-10-28 ENCOUNTER — Ambulatory Visit (INDEPENDENT_AMBULATORY_CARE_PROVIDER_SITE_OTHER): Payer: Medicare Other | Admitting: Nurse Practitioner

## 2017-10-28 ENCOUNTER — Encounter (INDEPENDENT_AMBULATORY_CARE_PROVIDER_SITE_OTHER): Payer: Self-pay

## 2017-10-28 VITALS — BP 115/75 | HR 87 | Resp 16 | Ht 63.5 in | Wt 175.0 lb

## 2017-10-28 DIAGNOSIS — I890 Lymphedema, not elsewhere classified: Secondary | ICD-10-CM | POA: Diagnosis not present

## 2017-10-28 NOTE — Progress Notes (Signed)
History of Present Illness  There is no documented history at this time  Assessments & Plan   There are no diagnoses linked to this encounter.    Additional instructions  Subjective:  Patient presents with venous ulcer of the Bilateral lower extremity.    Procedure:  3 layer unna wrap was placed Bilateral lower extremity.   Plan:   Follow up in one week.  

## 2017-11-04 ENCOUNTER — Encounter (INDEPENDENT_AMBULATORY_CARE_PROVIDER_SITE_OTHER): Payer: Self-pay | Admitting: Nurse Practitioner

## 2017-11-04 ENCOUNTER — Encounter (INDEPENDENT_AMBULATORY_CARE_PROVIDER_SITE_OTHER): Payer: Medicare Other

## 2017-11-04 ENCOUNTER — Ambulatory Visit (INDEPENDENT_AMBULATORY_CARE_PROVIDER_SITE_OTHER): Payer: Medicare Other | Admitting: Nurse Practitioner

## 2017-11-04 VITALS — BP 135/81 | HR 79 | Resp 16 | Ht 63.5 in | Wt 175.4 lb

## 2017-11-04 DIAGNOSIS — I10 Essential (primary) hypertension: Secondary | ICD-10-CM

## 2017-11-04 DIAGNOSIS — M79604 Pain in right leg: Secondary | ICD-10-CM | POA: Diagnosis not present

## 2017-11-04 DIAGNOSIS — I890 Lymphedema, not elsewhere classified: Secondary | ICD-10-CM

## 2017-11-04 DIAGNOSIS — M79605 Pain in left leg: Secondary | ICD-10-CM

## 2017-11-04 NOTE — Progress Notes (Signed)
Subjective:    Patient ID: Latoya Stevenson, female    DOB: [birth year], 77 y.o.   MRN: [id number] Chief Complaint  Patient presents with  . Follow-up    unna check    HPI  Latoya Stevenson  returns to the office for followup evaluation regarding bilateral leg swelling.  The swelling has improved quite a bit and the pain associated with swelling has decreased substantially. There have not been any interval development of  ulcerations or wounds.  Since the previous visit the patient has been in wraps, and there has been improvement to the swelling.  We are a now to utilize medical grade 1 compression stockings.  The patient also states elevation during the day and exercise is being done too.  Patient denies any chest pain or shortness of breath.  She denies any fever, chills, nausea, vomiting, diarrhea.  The patient denies any TIA or amaurosis fugax.   Constitutional: [] Weight loss  [] Fever  [] Chills Cardiac: [] Chest pain   [] Chest pressure   [] Palpitations   [] Shortness of breath when laying flat   [] Shortness of breath with exertion. Vascular:  [] Pain in legs with walking   [] Pain in legs with standing  [] History of DVT   [] Phlebitis   [x] Swelling in legs   [] Varicose veins   [] Non-healing ulcers Pulmonary:   [] Uses home oxygen   [] Productive cough   [] Hemoptysis   [] Wheeze  [] COPD   [] Asthma Neurologic:  [] Dizziness   [] Seizures   [] History of stroke   [] History of TIA  [] Aphasia   [] Vissual changes   [] Weakness or numbness in arm   [] Weakness or numbness in leg Musculoskeletal:   [] Joint swelling   [] Joint pain   [] Low back pain Hematologic:  [] Easy bruising  [] Easy bleeding   [] Hypercoagulable state   [] Anemic Gastrointestinal:  [] Diarrhea   [] Vomiting  [] Gastroesophageal reflux/heartburn   [] Difficulty swallowing. Genitourinary:  [] Chronic kidney disease   [] Difficult urination  [] Frequent urination   [] Blood in urine Skin:  [] Rashes   [] Ulcers  Psychological:  [] History of anxiety   []   History of major depression.     Objective:   Physical Exam  BP 135/81 (BP Location: Right Arm)   Pulse 79   Resp 16   Ht 5' 3.5" (1.613 m)   Wt 175 lb 6.4 oz (79.6 kg)   BMI 30.58 kg/m   Past Medical History:  Diagnosis Date  . Allergic rhinitis   . Anemia    low iron  . Arthritis   . Asthma without status asthmaticus    unspecified  . Bilateral sciatica   . Bronchitis   . Cancer ([place])     follicular variant papillary thyroid cancer  . Chronic back pain    unspecified  . Chronic kidney disease   . Colon polyp    adenomatous  . DDD (degenerative disc disease), lumbar   . DJD (degenerative joint disease)   . Dysphagia   . Dyspnea   . Environmental allergies   . GERD (gastroesophageal reflux disease)   . Headache    migraines as a young woman  . History of recurrent UTIs   . Hyperlipidemia   . Hypertension   . Hypothyroidism   . IBS (irritable bowel syndrome)   . Lumbar disc disease   . Mold exposure 2002   hx of   . Osteoporosis, post-menopausal   . Pneumonia 1978  . Restless legs   . Scoliosis   . Spinal stenosis   . Thyroid  nodule    aspirated in 1997 and 2003 which were negative  . Ulcer   . Varicose veins of left lower extremity   . Vertigo   . Wears dentures      Gen: WD/WN, NAD Head: /AT, No temporalis wasting.  Ear/Nose/Throat: Hearing grossly intact, nares w/o erythema or drainage Eyes: PER, EOMI, sclera nonicteric.  Neck: Supple, no masses.  No bruit or JVD.  Pulmonary:  Good air movement, clear to auscultation bilaterally, no use of accessory muscles.  Cardiac: RRR Vascular:  Vessel Right Left  DP  palpable Palpable   Gastrointestinal: soft, non-distended. No guarding/no peritoneal signs.  Musculoskeletal: M/S 5/5 throughout.  No deformity or atrophy.  Neurologic: Pain and light touch intact in extremities.  Symmetrical.  Speech is fluent. Motor exam as listed above. Psychiatric: Judgment intact, Mood & affect appropriate for pt's  clinical situation. Dermatologic:  Mild stasis dermatitis no ulcers noted.  No changes consistent with cellulitis. Lymph : No Cervical lymphadenopathy, no lichenification .  Dermal thickening present 2 +with soft edema   Social History   Socioeconomic History  . Marital status: Widowed    Spouse name: Not on file  . Number of children: Not on file  . Years of education: Not on file  . Highest education level: Not on file  Occupational History  . Not on file  Social Needs  . Financial resource strain: Not on file  . Food insecurity:    Worry: Not on file    Inability: Not on file  . Transportation needs:    Medical: Not on file    Non-medical: Not on file  Tobacco Use  . Smoking status: Never Smoker  . Smokeless tobacco: Never Used  Substance and Sexual Activity  . Alcohol use: No    Comment: socially  . Drug use: No  . Sexual activity: Not on file  Lifestyle  . Physical activity:    Days per week: Not on file    Minutes per session: Not on file  . Stress: Not on file  Relationships  . Social connections:    Talks on phone: Not on file    Gets together: Not on file    Attends religious service: Not on file    Active member of club or organization: Not on file    Attends meetings of clubs or organizations: Not on file    Relationship status: Not on file  . Intimate partner violence:    Fear of current or ex partner: Not on file    Emotionally abused: Not on file    Physically abused: Not on file    Forced sexual activity: Not on file  Other Topics Concern  . Not on file  Social History Narrative  . Not on file    Past Surgical History:  Procedure Laterality Date  . ABDOMINAL HYSTERECTOMY  1977   Dr. Laverta Baltimore  . ANKLE FRACTURE SURGERY Left 2003  . ANTERIOR LAT LUMBAR FUSION Left 09/09/2016   Procedure: Left Lumbar two-three Anterolateral lumbar interbody fusion with lateral plate;  Surgeon: Erline Levine, MD;  Location: [facility];  Service: Neurosurgery;  Laterality:  Left;  Left L2-3 Anterolateral lumbar interbody fusion with lateral plate  . APPENDECTOMY    . BIOPSY THYROID     x2  thyroid nodule ; Dr. Sharlet Salina and one by Dr. Sabra Heck  . BREAST CYST ASPIRATION  1970's  . broken arm  1994   wrist fracture surgery, left  . CARDIOVASCULAR STRESS TEST  2010  . COLONOSCOPY  2010  . COLONOSCOPY  07/21/2013   PHX OF CP/REPEAT 37YRS/[place]  . EYE SURGERY Bilateral    cataract surgery with lens implant  . LUMBAR LAMINECTOMY/DECOMPRESSION MICRODISCECTOMY  11/18/2011   Procedure: LUMBAR LAMINECTOMY/DECOMPRESSION MICRODISCECTOMY 2 LEVELS;  Surgeon: Erline Levine, MD;  Location: [facility];  Service: Neurosurgery;  Laterality: N/A;  Lumbar Three-Four,  Lumbar Four-Five Decompressive Laminectomy  . OVARIAN CYST SURGERY    . POSTERIOR FUSION LUMBAR SPINE  2016  . TOTAL THYROIDECTOMY    . TRANSTHORACIC ECHOCARDIOGRAM  2010  . upper and lower GI  2010    Family History  Problem Relation Age of Onset  . Cancer Mother        breast  . Breast cancer Mother 23  . Cancer Brother   . Cancer Sister        pancreatic  . Suicidality Father   . Breast cancer Sister        x 2 times  . Heart attack Sister     Allergies  Allergen Reactions  . Macrodantin [Nitrofurantoin Macrocrystal] Anaphylaxis, Hives and Swelling  . Penicillins Anaphylaxis and Swelling    PATIENT HAD A PCN REACTION WITH IMMEDIATE RASH, FACIAL/TONGUE/THROAT SWELLING, SOB, OR LIGHTHEADEDNESS WITH HYPOTENSION:  #  #  #  YES  #  #  #   Has patient had a PCN reaction causing severe rash involving mucus membranes or skin necrosis: No Has patient had a PCN reaction that required hospitalization: No Has patient had a PCN reaction occurring within the last 10 years: No If all of the above answers are "NO", then may proceed with Cephalosporin use.   . Celebrex [Celecoxib] Swelling    SWELLING REACTION UNSPECIFIED   . Flagyl [Metronidazole Hcl] Swelling    SWELLING REACTION UNSPECIFIED   . Furosemide  Itching       Assessment & Plan:   1. Lymphedema I have had a discussion with the patient reiterating swelling and why it  causes symptoms.  Patient will begin wearing graduated compression stockings class 1 (20-30 mmHg) on a daily basis a prescription was given. The patient will  beginning wearing the stockings first thing in the morning and removing them in the evening. The patient is instructed specifically not to sleep in the stockings.   In addition, behavioral modification will be continued from what was done during unna wraps this will include frequent elevation, use of over the counter pain medications and exercise such as walking.  I have reviewed systemic causes for chronic edema such as liver, kidney and cardiac etiologies.  The patient denies problems with these organ systems.    Consideration for a lymph pump will also be made based upon the effectiveness of conservative therapy.  This would help to improve the edema control and prevent sequela such as ulcers and infections      2. Essential hypertension Continue antihypertensive medications as already ordered, these medications have been reviewed and there are no changes at this time.   3. Pain in both lower extremities See above   Current Outpatient Medications on File Prior to Visit  Medication Sig Dispense Refill  . amLODipine (NORVASC) 5 MG tablet Take 5 mg by mouth at bedtime.     Marland Kitchen atorvastatin (LIPITOR) 40 MG tablet Take 40 mg by mouth at bedtime.     . Calcium Carbonate-Vitamin D (CALCIUM 600+D) 600-400 MG-UNIT tablet Take 2 tablets by mouth daily.    . chlorthalidone (HYGROTON) 25 MG tablet  Take 25 mg by mouth daily.  1  . Cholecalciferol (VITAMIN D-3) 5000 UNITS TABS Take 5,000 Units by mouth daily.     . diphenhydrAMINE (SOMINEX) 25 MG tablet Take 25 mg by mouth at bedtime as needed for sleep. For sleep    . doxylamine, Sleep, (UNISOM) 25 MG tablet Take 25 mg by mouth at bedtime.     . fexofenadine  (ALLEGRA) 180 MG tablet Take 180 mg by mouth at bedtime.     . fluticasone (FLONASE) 50 MCG/ACT nasal spray Place 2 sprays into both nostrils at bedtime.     . furosemide (LASIX) 20 MG tablet Take 20 mg by mouth daily.  11  . gentamicin ointment (GARAMYCIN) 0.1 % APPLY THIN LAYER TWICE DAILY TO HEALING WOUND WITH DRESSING CHANGES  0  . Glucosamine-Chondroitin 750-600 MG TABS Take 1 tablet by mouth 2 (two) times daily.    . hydrochlorothiazide (HYDRODIURIL) 12.5 MG tablet Take by mouth.    . irbesartan (AVAPRO) 300 MG tablet Take 300 mg by mouth daily.     Marland Kitchen levothyroxine (SYNTHROID, LEVOTHROID) 150 MCG tablet Take 150 mcg by mouth daily before breakfast.     . meclizine (ANTIVERT) 12.5 MG tablet Take 12.5 mg by mouth 3 (three) times daily as needed for dizziness.    . methocarbamol (ROBAXIN) 500 MG tablet Take 1 tablet (500 mg total) by mouth every 6 (six) hours as needed for muscle spasms. 60 tablet 1  . Multiple Vitamin (MULTIVITAMIN WITH MINERALS) TABS Take 1 tablet by mouth daily.    . nabumetone (RELAFEN) 500 MG tablet TAKE 1 TABLET BY MOUTH TWICE DAILY    . omeprazole (PRILOSEC) 20 MG capsule Take 20 mg by mouth daily before breakfast.     . oxyCODONE (OXY IR/ROXICODONE) 5 MG immediate release tablet TAKE 1 TABLET BY MOUTH EVERY 6 TO 8 HOURS AS NEEDED FOR PAIN  0  . senna (SENOKOT) 8.6 MG tablet Take 2 tablets by mouth 2 (two) times daily.    . Vitamin D, Ergocalciferol, (DRISDOL) 50000 units CAPS capsule Take by mouth.    . vitamin E 400 UNIT capsule Take 400 Units by mouth daily.      Marland Kitchen doxycycline (VIBRA-TABS) 100 MG tablet Take 1 tablet (100 mg total) by mouth 2 (two) times daily. (Patient not taking: Reported on 10/14/2017) 20 tablet 0   No current facility-administered medications on file prior to visit.     There are no Patient Instructions on file for this visit. No follow-ups on file.   Kris Hartmann, NP

## 2017-11-11 ENCOUNTER — Ambulatory Visit (INDEPENDENT_AMBULATORY_CARE_PROVIDER_SITE_OTHER): Payer: Medicare Other | Admitting: Vascular Surgery

## 2017-11-18 ENCOUNTER — Ambulatory Visit (INDEPENDENT_AMBULATORY_CARE_PROVIDER_SITE_OTHER): Payer: Medicare Other | Admitting: Vascular Surgery

## 2017-12-17 ENCOUNTER — Other Ambulatory Visit: Payer: Self-pay | Admitting: Internal Medicine

## 2017-12-17 DIAGNOSIS — Z1231 Encounter for screening mammogram for malignant neoplasm of breast: Secondary | ICD-10-CM

## 2018-01-18 ENCOUNTER — Ambulatory Visit
Admission: RE | Admit: 2018-01-18 | Discharge: 2018-01-18 | Disposition: A | Discharge status: Home / Self Care | Payer: Medicare Other | Source: Ambulatory Visit | Attending: Internal Medicine | Admitting: Internal Medicine

## 2018-01-18 DIAGNOSIS — Z1231 Encounter for screening mammogram for malignant neoplasm of breast: Secondary | ICD-10-CM

## 2018-01-28 DIAGNOSIS — K222 Esophageal obstruction: Secondary | ICD-10-CM | POA: Insufficient documentation

## 2018-02-03 ENCOUNTER — Ambulatory Visit (INDEPENDENT_AMBULATORY_CARE_PROVIDER_SITE_OTHER): Payer: Medicare Other | Admitting: Vascular Surgery

## 2018-02-03 ENCOUNTER — Encounter (INDEPENDENT_AMBULATORY_CARE_PROVIDER_SITE_OTHER): Payer: Self-pay | Admitting: Vascular Surgery

## 2018-02-03 VITALS — BP 127/81 | HR 79 | Resp 16 | Ht 63.5 in | Wt 180.0 lb

## 2018-02-03 DIAGNOSIS — E785 Hyperlipidemia, unspecified: Secondary | ICD-10-CM | POA: Insufficient documentation

## 2018-02-03 DIAGNOSIS — I890 Lymphedema, not elsewhere classified: Secondary | ICD-10-CM

## 2018-02-03 DIAGNOSIS — I10 Essential (primary) hypertension: Secondary | ICD-10-CM | POA: Diagnosis not present

## 2018-02-03 NOTE — Progress Notes (Signed)
Subjective:    Patient ID: Latoya Stevenson, female    DOB: March 02, 1941, 77 y.o.   MRN: [id number] Chief Complaint  Patient presents with  . Follow-up    2-3 month follow up   Patient presents for a 68-month lymphedema follow-up.  The patient was last seen on November 04, 2017.  The patient presents today without complaint.  The patient denies any bilateral lower extremity claudication-like symptoms, rest pain or ulcer formation to the bilateral legs.  The patient reports an improvement continue improvement in the edema to her bilateral legs.  Since her initial visit, the patient has been engaging conservative therapy including wearing medical grade 1 compression socks, elevating her legs and remaining active.  This has provided a market improvement to the swelling noted in her bilateral legs.  And is pleased with the results.  Patient denies any recent bouts of cellulitis.  Patient denies any fever, nausea vomiting.  Review of Systems  Constitutional: Negative.   HENT: Negative.   Eyes: Negative.   Respiratory: Negative.   Cardiovascular: Positive for leg swelling.       Lymphedema  Gastrointestinal: Negative.   Endocrine: Negative.   Genitourinary: Negative.   Musculoskeletal: Negative.   Skin: Negative.   Allergic/Immunologic: Negative.   Neurological: Negative.   Hematological: Negative.   Psychiatric/Behavioral: Negative.       Objective:   Physical Exam  Constitutional: She is oriented to person, place, and time. She appears well-developed and well-nourished. No distress.  HENT:  Head: Normocephalic and atraumatic.  Right Ear: External ear normal.  Left Ear: External ear normal.  Eyes: Pupils are equal, round, and reactive to light. Conjunctivae and EOM are normal.  Neck: Normal range of motion.  Cardiovascular: Normal rate, regular rhythm and normal heart sounds.  Pulmonary/Chest: Effort normal and breath sounds normal.  Musculoskeletal: Normal range of motion. She exhibits  edema (Bilateral lower extremity edema is nonpitting and mild.).  Neurological: She is alert and oriented to person, place, and time.  Skin: Skin is warm and dry. She is not diaphoretic.  Psychiatric: She has a normal mood and affect. Her behavior is normal. Judgment and thought content normal.  Vitals reviewed.  BP 127/81 (BP Location: Left Arm, Patient Position: Sitting)   Pulse 79   Resp 16   Ht 5' 3.5" (1.613 m)   Wt 180 lb (81.6 kg)   BMI 31.39 kg/m   Past Medical History:  Diagnosis Date  . Allergic rhinitis   . Anemia    low iron  . Arthritis   . Asthma without status asthmaticus    unspecified  . Bilateral sciatica   . Bronchitis   . Cancer ([place])     follicular variant papillary thyroid cancer  . Chronic back pain    unspecified  . Chronic kidney disease   . Colon polyp    adenomatous  . DDD (degenerative disc disease), lumbar   . DJD (degenerative joint disease)   . Dysphagia   . Dyspnea   . Environmental allergies   . GERD (gastroesophageal reflux disease)   . Headache    migraines as a young woman  . History of recurrent UTIs   . Hyperlipidemia   . Hypertension   . Hypothyroidism   . IBS (irritable bowel syndrome)   . Lumbar disc disease   . Mold exposure 2002   hx of   . Osteoporosis, post-menopausal   . Pneumonia 1978  . Restless legs   . Scoliosis   .  Spinal stenosis   . Thyroid nodule    aspirated in 1997 and 2003 which were negative  . Ulcer   . Varicose veins of left lower extremity   . Vertigo   . Wears dentures    Social History   Socioeconomic History  . Marital status: Widowed    Spouse name: Not on file  . Number of children: Not on file  . Years of education: Not on file  . Highest education level: Not on file  Occupational History  . Not on file  Social Needs  . Financial resource strain: Not on file  . Food insecurity:    Worry: Not on file    Inability: Not on file  . Transportation needs:    Medical: Not on file     Non-medical: Not on file  Tobacco Use  . Smoking status: Never Smoker  . Smokeless tobacco: Never Used  Substance and Sexual Activity  . Alcohol use: No    Comment: socially  . Drug use: No  . Sexual activity: Not on file  Lifestyle  . Physical activity:    Days per week: Not on file    Minutes per session: Not on file  . Stress: Not on file  Relationships  . Social connections:    Talks on phone: Not on file    Gets together: Not on file    Attends religious service: Not on file    Active member of club or organization: Not on file    Attends meetings of clubs or organizations: Not on file    Relationship status: Not on file  . Intimate partner violence:    Fear of current or ex partner: Not on file    Emotionally abused: Not on file    Physically abused: Not on file    Forced sexual activity: Not on file  Other Topics Concern  . Not on file  Social History Narrative  . Not on file   Past Surgical History:  Procedure Laterality Date  . ABDOMINAL HYSTERECTOMY  1977   Dr. Laverta Baltimore  . ANKLE FRACTURE SURGERY Left 2003  . ANTERIOR LAT LUMBAR FUSION Left 09/09/2016   Procedure: Left Lumbar two-three Anterolateral lumbar interbody fusion with lateral plate;  Surgeon: Erline Levine, MD;  Location: [facility];  Service: Neurosurgery;  Laterality: Left;  Left L2-3 Anterolateral lumbar interbody fusion with lateral plate  . APPENDECTOMY    . BIOPSY THYROID     x2  thyroid nodule ; Dr. Sharlet Salina and one by Dr. Sabra Heck  . BREAST CYST ASPIRATION  1970's  . broken arm  1994   wrist fracture surgery, left  . CARDIOVASCULAR STRESS TEST     2010  . COLONOSCOPY  2010  . COLONOSCOPY  07/21/2013   PHX OF CP/REPEAT 86YRS/[place]  . EYE SURGERY Bilateral    cataract surgery with lens implant  . LUMBAR LAMINECTOMY/DECOMPRESSION MICRODISCECTOMY  11/18/2011   Procedure: LUMBAR LAMINECTOMY/DECOMPRESSION MICRODISCECTOMY 2 LEVELS;  Surgeon: Erline Levine, MD;  Location: [facility];  Service: Neurosurgery;   Laterality: N/A;  Lumbar Three-Four,  Lumbar Four-Five Decompressive Laminectomy  . OVARIAN CYST SURGERY    . POSTERIOR FUSION LUMBAR SPINE  2016  . TOTAL THYROIDECTOMY    . TRANSTHORACIC ECHOCARDIOGRAM  2010  . upper and lower GI  2010   Family History  Problem Relation Age of Onset  . Cancer Mother        breast  . Breast cancer Mother 70  . Cancer Brother   .  Cancer Sister        pancreatic  . Suicidality Father   . Breast cancer Sister        x 2 times  . Heart attack Sister    Allergies  Allergen Reactions  . Macrodantin [Nitrofurantoin Macrocrystal] Anaphylaxis, Hives and Swelling  . Penicillins Anaphylaxis and Swelling    PATIENT HAD A PCN REACTION WITH IMMEDIATE RASH, FACIAL/TONGUE/THROAT SWELLING, SOB, OR LIGHTHEADEDNESS WITH HYPOTENSION:  #  #  #  YES  #  #  #   Has patient had a PCN reaction causing severe rash involving mucus membranes or skin necrosis: No Has patient had a PCN reaction that required hospitalization: No Has patient had a PCN reaction occurring within the last 10 years: No If all of the above answers are "NO", then may proceed with Cephalosporin use.   . Celebrex [Celecoxib] Swelling    SWELLING REACTION UNSPECIFIED   . Flagyl [Metronidazole Hcl] Swelling    SWELLING REACTION UNSPECIFIED   . Furosemide Itching      Assessment & Plan:  Patient presents for a 64-month lymphedema follow-up.  The patient was last seen on November 04, 2017.  The patient presents today without complaint.  The patient denies any bilateral lower extremity claudication-like symptoms, rest pain or ulcer formation to the bilateral legs.  The patient reports an improvement continue improvement in the edema to her bilateral legs.  Since her initial visit, the patient has been engaging conservative therapy including wearing medical grade 1 compression socks, elevating her legs and remaining active.  This has provided a market improvement to the swelling noted in her bilateral legs.   And is pleased with the results.  Patient denies any recent bouts of cellulitis.  Patient denies any fever, nausea vomiting.  1. Lymphedema - Stable The patient has been engaging in conservative therapy including wearing medical grade one compression socks, elevating her legs and remaining active This has provided a market improvement in the edema and discomfort she was experiencing to her bilateral legs The patient is pleased with her improvement We had a discussion about possibly adding a lymphedema pump however at this time patient feels that her symptoms are controlled She understands that this is an option in the future I will see the patient back in 1 year unless she would like to be seen sooner The patient expresses understanding  2. Hyperlipidemia, unspecified hyperlipidemia type - Stable On Statin. Encouraged good control as its slows the progression of atherosclerotic disease.  3. Essential hypertension - Stable Encouraged good control as its slows the progression of atherosclerotic disease.  Current Outpatient Medications on File Prior to Visit  Medication Sig Dispense Refill  . amLODipine (NORVASC) 5 MG tablet Take 10 mg by mouth at bedtime.     Marland Kitchen atorvastatin (LIPITOR) 40 MG tablet Take 40 mg by mouth at bedtime.     . Calcium Carbonate-Vitamin D (CALCIUM 600+D) 600-400 MG-UNIT tablet Take 2 tablets by mouth daily.    . chlorthalidone (HYGROTON) 25 MG tablet Take 25 mg by mouth daily.  1  . Cholecalciferol (VITAMIN D-3) 5000 UNITS TABS Take 5,000 Units by mouth daily.     . diphenhydrAMINE (SOMINEX) 25 MG tablet Take 25 mg by mouth at bedtime as needed for sleep. For sleep    . doxylamine, Sleep, (UNISOM) 25 MG tablet Take 25 mg by mouth at bedtime.     . fexofenadine (ALLEGRA) 180 MG tablet Take 180 mg by mouth at bedtime.     Marland Kitchen  fluticasone (FLONASE) 50 MCG/ACT nasal spray Place 2 sprays into both nostrils at bedtime.     . furosemide (LASIX) 20 MG tablet Take 20 mg by mouth  daily.  11  . gentamicin ointment (GARAMYCIN) 0.1 % APPLY THIN LAYER TWICE DAILY TO HEALING WOUND WITH DRESSING CHANGES  0  . Glucosamine-Chondroitin 750-600 MG TABS Take 1 tablet by mouth 2 (two) times daily.    . hydrochlorothiazide (HYDRODIURIL) 12.5 MG tablet Take by mouth.    . irbesartan (AVAPRO) 300 MG tablet Take 300 mg by mouth daily.     Marland Kitchen levothyroxine (SYNTHROID, LEVOTHROID) 150 MCG tablet Take 150 mcg by mouth daily before breakfast.     . meclizine (ANTIVERT) 12.5 MG tablet Take 12.5 mg by mouth 3 (three) times daily as needed for dizziness.    . methocarbamol (ROBAXIN) 500 MG tablet Take 1 tablet (500 mg total) by mouth every 6 (six) hours as needed for muscle spasms. 60 tablet 1  . Misc Natural Products (GLUCOSAMINE CHONDROITIN ADV PO) Take by mouth.    . Multiple Vitamin (MULTIVITAMIN WITH MINERALS) TABS Take 1 tablet by mouth daily.    . nabumetone (RELAFEN) 500 MG tablet TAKE 1 TABLET BY MOUTH TWICE DAILY    . omeprazole (PRILOSEC) 20 MG capsule Take 20 mg by mouth daily before breakfast.     . oxyCODONE (OXY IR/ROXICODONE) 5 MG immediate release tablet TAKE 1 TABLET BY MOUTH EVERY 6 TO 8 HOURS AS NEEDED FOR PAIN  0  . senna (SENOKOT) 8.6 MG tablet Take 2 tablets by mouth 2 (two) times daily.    . Vitamin D, Ergocalciferol, (DRISDOL) 50000 units CAPS capsule Take by mouth.    . vitamin E 400 UNIT capsule Take 400 Units by mouth daily.       No current facility-administered medications on file prior to visit.    There are no Patient Instructions on file for this visit. No follow-ups on file.  Hillery Zachman A Lossie Kalp, PA-C

## 2018-08-16 DIAGNOSIS — E039 Hypothyroidism, unspecified: Secondary | ICD-10-CM | POA: Diagnosis not present

## 2018-08-16 DIAGNOSIS — I10 Essential (primary) hypertension: Secondary | ICD-10-CM | POA: Diagnosis not present

## 2018-08-18 DIAGNOSIS — M810 Age-related osteoporosis without current pathological fracture: Secondary | ICD-10-CM | POA: Diagnosis not present

## 2018-09-08 DIAGNOSIS — E782 Mixed hyperlipidemia: Secondary | ICD-10-CM | POA: Diagnosis not present

## 2018-09-08 DIAGNOSIS — E039 Hypothyroidism, unspecified: Secondary | ICD-10-CM | POA: Diagnosis not present

## 2018-09-08 DIAGNOSIS — M810 Age-related osteoporosis without current pathological fracture: Secondary | ICD-10-CM | POA: Diagnosis not present

## 2018-09-08 DIAGNOSIS — Z0001 Encounter for general adult medical examination with abnormal findings: Secondary | ICD-10-CM | POA: Diagnosis not present

## 2018-09-08 DIAGNOSIS — K222 Esophageal obstruction: Secondary | ICD-10-CM | POA: Diagnosis not present

## 2018-09-08 DIAGNOSIS — M5441 Lumbago with sciatica, right side: Secondary | ICD-10-CM | POA: Diagnosis not present

## 2018-09-08 DIAGNOSIS — Z0000 Encounter for general adult medical examination without abnormal findings: Secondary | ICD-10-CM | POA: Diagnosis not present

## 2018-09-08 DIAGNOSIS — I10 Essential (primary) hypertension: Secondary | ICD-10-CM | POA: Diagnosis not present

## 2018-09-08 DIAGNOSIS — K219 Gastro-esophageal reflux disease without esophagitis: Secondary | ICD-10-CM | POA: Diagnosis not present

## 2018-09-21 ENCOUNTER — Encounter: Payer: Self-pay | Admitting: *Deleted

## 2018-09-21 ENCOUNTER — Other Ambulatory Visit: Admission: RE | Admit: 2018-09-21 | Payer: Medicare HMO | Source: Ambulatory Visit

## 2018-09-22 ENCOUNTER — Ambulatory Visit: Payer: Medicare HMO | Admitting: Anesthesiology

## 2018-09-22 ENCOUNTER — Encounter
Admission: RE | Disposition: A | Discharge status: Home / Self Care | Payer: Self-pay | Source: Home / Self Care | Attending: Internal Medicine

## 2018-09-22 ENCOUNTER — Ambulatory Visit
Admission: RE | Admit: 2018-09-22 | Discharge: 2018-09-22 | Disposition: A | Discharge status: Home / Self Care | Payer: Medicare HMO | Attending: Internal Medicine | Admitting: Internal Medicine

## 2018-09-22 ENCOUNTER — Other Ambulatory Visit
Admission: RE | Admit: 2018-09-22 | Discharge: 2018-09-22 | Disposition: A | Discharge status: Home / Self Care | Payer: Medicare HMO | Source: Ambulatory Visit | Attending: Internal Medicine | Admitting: Internal Medicine

## 2018-09-22 ENCOUNTER — Other Ambulatory Visit: Payer: Self-pay

## 2018-09-22 ENCOUNTER — Encounter: Payer: Self-pay | Admitting: *Deleted

## 2018-09-22 DIAGNOSIS — I8392 Asymptomatic varicose veins of left lower extremity: Secondary | ICD-10-CM | POA: Insufficient documentation

## 2018-09-22 DIAGNOSIS — Z85850 Personal history of malignant neoplasm of thyroid: Secondary | ICD-10-CM | POA: Insufficient documentation

## 2018-09-22 DIAGNOSIS — K222 Esophageal obstruction: Secondary | ICD-10-CM | POA: Insufficient documentation

## 2018-09-22 DIAGNOSIS — M5430 Sciatica, unspecified side: Secondary | ICD-10-CM | POA: Insufficient documentation

## 2018-09-22 DIAGNOSIS — Z888 Allergy status to other drugs, medicaments and biological substances status: Secondary | ICD-10-CM | POA: Insufficient documentation

## 2018-09-22 DIAGNOSIS — K449 Diaphragmatic hernia without obstruction or gangrene: Secondary | ICD-10-CM | POA: Diagnosis not present

## 2018-09-22 DIAGNOSIS — R1310 Dysphagia, unspecified: Secondary | ICD-10-CM | POA: Diagnosis not present

## 2018-09-22 DIAGNOSIS — Z87440 Personal history of urinary (tract) infections: Secondary | ICD-10-CM | POA: Diagnosis not present

## 2018-09-22 DIAGNOSIS — R0600 Dyspnea, unspecified: Secondary | ICD-10-CM | POA: Insufficient documentation

## 2018-09-22 DIAGNOSIS — G2581 Restless legs syndrome: Secondary | ICD-10-CM | POA: Insufficient documentation

## 2018-09-22 DIAGNOSIS — K219 Gastro-esophageal reflux disease without esophagitis: Secondary | ICD-10-CM | POA: Diagnosis not present

## 2018-09-22 DIAGNOSIS — Z79899 Other long term (current) drug therapy: Secondary | ICD-10-CM | POA: Insufficient documentation

## 2018-09-22 DIAGNOSIS — I10 Essential (primary) hypertension: Secondary | ICD-10-CM | POA: Insufficient documentation

## 2018-09-22 DIAGNOSIS — G8929 Other chronic pain: Secondary | ICD-10-CM | POA: Diagnosis not present

## 2018-09-22 DIAGNOSIS — E039 Hypothyroidism, unspecified: Secondary | ICD-10-CM | POA: Diagnosis not present

## 2018-09-22 DIAGNOSIS — K208 Other esophagitis: Secondary | ICD-10-CM | POA: Diagnosis not present

## 2018-09-22 DIAGNOSIS — D631 Anemia in chronic kidney disease: Secondary | ICD-10-CM | POA: Insufficient documentation

## 2018-09-22 DIAGNOSIS — Z886 Allergy status to analgesic agent status: Secondary | ICD-10-CM | POA: Diagnosis not present

## 2018-09-22 DIAGNOSIS — Z881 Allergy status to other antibiotic agents status: Secondary | ICD-10-CM | POA: Insufficient documentation

## 2018-09-22 DIAGNOSIS — Z20828 Contact with and (suspected) exposure to other viral communicable diseases: Secondary | ICD-10-CM | POA: Insufficient documentation

## 2018-09-22 DIAGNOSIS — M549 Dorsalgia, unspecified: Secondary | ICD-10-CM | POA: Diagnosis not present

## 2018-09-22 DIAGNOSIS — B379 Candidiasis, unspecified: Secondary | ICD-10-CM | POA: Diagnosis not present

## 2018-09-22 DIAGNOSIS — N189 Chronic kidney disease, unspecified: Secondary | ICD-10-CM | POA: Insufficient documentation

## 2018-09-22 DIAGNOSIS — M810 Age-related osteoporosis without current pathological fracture: Secondary | ICD-10-CM | POA: Insufficient documentation

## 2018-09-22 DIAGNOSIS — Z86010 Personal history of colonic polyps: Secondary | ICD-10-CM | POA: Insufficient documentation

## 2018-09-22 DIAGNOSIS — I129 Hypertensive chronic kidney disease with stage 1 through stage 4 chronic kidney disease, or unspecified chronic kidney disease: Secondary | ICD-10-CM | POA: Insufficient documentation

## 2018-09-22 DIAGNOSIS — E785 Hyperlipidemia, unspecified: Secondary | ICD-10-CM | POA: Diagnosis not present

## 2018-09-22 DIAGNOSIS — K229 Disease of esophagus, unspecified: Secondary | ICD-10-CM | POA: Insufficient documentation

## 2018-09-22 DIAGNOSIS — Z791 Long term (current) use of non-steroidal anti-inflammatories (NSAID): Secondary | ICD-10-CM | POA: Insufficient documentation

## 2018-09-22 DIAGNOSIS — J45909 Unspecified asthma, uncomplicated: Secondary | ICD-10-CM | POA: Diagnosis not present

## 2018-09-22 DIAGNOSIS — Z880 Allergy status to penicillin: Secondary | ICD-10-CM | POA: Insufficient documentation

## 2018-09-22 DIAGNOSIS — R42 Dizziness and giddiness: Secondary | ICD-10-CM | POA: Insufficient documentation

## 2018-09-22 DIAGNOSIS — M419 Scoliosis, unspecified: Secondary | ICD-10-CM | POA: Insufficient documentation

## 2018-09-22 DIAGNOSIS — K589 Irritable bowel syndrome without diarrhea: Secondary | ICD-10-CM | POA: Insufficient documentation

## 2018-09-22 DIAGNOSIS — R51 Headache: Secondary | ICD-10-CM | POA: Insufficient documentation

## 2018-09-22 DIAGNOSIS — M5136 Other intervertebral disc degeneration, lumbar region: Secondary | ICD-10-CM | POA: Diagnosis not present

## 2018-09-22 HISTORY — PX: ESOPHAGOGASTRODUODENOSCOPY (EGD) WITH PROPOFOL: SHX5813

## 2018-09-22 HISTORY — PX: MALONEY DILATION: SHX5535

## 2018-09-22 LAB — KOH PREP: Special Requests: NORMAL

## 2018-09-22 LAB — SARS CORONAVIRUS 2 BY RT PCR (HOSPITAL ORDER, PERFORMED IN ~~LOC~~ HOSPITAL LAB): SARS Coronavirus 2: NEGATIVE

## 2018-09-22 SURGERY — ESOPHAGOGASTRODUODENOSCOPY (EGD) WITH PROPOFOL
Anesthesia: General

## 2018-09-22 MED ORDER — PROPOFOL 10 MG/ML IV BOLUS
INTRAVENOUS | Status: DC | PRN
  Administered 2018-09-22 (×2): 40 mg via INTRAVENOUS

## 2018-09-22 MED ORDER — LIDOCAINE HCL (PF) 2 % IJ SOLN
INTRAMUSCULAR | Status: AC
  Filled 2018-09-22: qty 10

## 2018-09-22 MED ORDER — PROPOFOL 500 MG/50ML IV EMUL
INTRAVENOUS | Status: DC | PRN
  Administered 2018-09-22: 100 ug/kg/min via INTRAVENOUS

## 2018-09-22 MED ORDER — LIDOCAINE HCL (CARDIAC) PF 100 MG/5ML IV SOSY
PREFILLED_SYRINGE | INTRAVENOUS | Status: DC | PRN
  Administered 2018-09-22: 60 mg via INTRAVENOUS

## 2018-09-22 MED ORDER — SODIUM CHLORIDE 0.9 % IV SOLN
INTRAVENOUS | Status: DC
  Administered 2018-09-22: 12:00:00 via INTRAVENOUS

## 2018-09-22 MED ORDER — PROPOFOL 10 MG/ML IV BOLUS
INTRAVENOUS | Status: AC
  Filled 2018-09-22: qty 40

## 2018-09-22 NOTE — Op Note (Signed)
[facility] Gastroenterology Patient Name: Latoya Stevenson Procedure Date: 09/22/2018 12:17 PM MRN: [id number] Account #: [id number] Date of Birth: Jun 13, 1940 Admit Type: Outpatient Age: 78 Room: [facility] ENDO ROOM 3 Gender: Female Note Status: Finalized Procedure:            Upper GI endoscopy Indications:          Dysphagia, Follow-up of esophageal reflux Providers:            Benay Pike. [name] MD, MD Referring MD:         Baxter Hire, MD (Referring MD) Medicines:            Propofol per Anesthesia Complications:        No immediate complications. Procedure:            Pre-Anesthesia Assessment:                       - The risks and benefits of the procedure and the                        sedation options and risks were discussed with the                        patient. All questions were answered and informed                        consent was obtained.                       - Patient identification and proposed procedure were                        verified prior to the procedure by the nurse. The                        procedure was verified in the procedure room.                       - ASA Grade Assessment: III - A patient with severe                        systemic disease.                       - After reviewing the risks and benefits, the patient                        was deemed in satisfactory condition to undergo the                        procedure.                       After obtaining informed consent, the endoscope was                        passed under direct vision. Throughout the procedure,                        the patient's blood pressure, pulse, and oxygen  saturations were monitored continuously. The Endoscope                        was introduced through the mouth, and advanced to the                        third part of duodenum. The upper GI endoscopy was                        accomplished without difficulty. The  patient tolerated                        the procedure well. Findings:      Diffuse, white plaques were found in the middle third of the esophagus       and in the lower third of the esophagus. Cells for cytology were       obtained by brushing.      One benign-appearing, intrinsic moderate stenosis was found in the       distal esophagus. This stenosis measured 1.3 cm (inner diameter) x less       than one cm (in length). The stenosis was traversed. The scope was       withdrawn. Dilation was performed with a Maloney dilator with mild       resistance at 77 Fr.      A 1 cm hiatal hernia was present.      The examined duodenum was normal.      The exam was otherwise without abnormality. Impression:           - Esophageal plaques were found, consistent with                        candidiasis. Cells for cytology obtained.                       - Benign-appearing esophageal stenosis. Dilated.                       - 1 cm hiatal hernia.                       - Normal examined duodenum.                       - The examination was otherwise normal. Recommendation:       - Patient has a contact number available for                        emergencies. The signs and symptoms of potential                        delayed complications were discussed with the patient.                        Return to normal activities tomorrow. Written discharge                        instructions were provided to the patient.                       - Resume previous diet.                       -  Continue present medications.                       - Diflucan (fluconazole) 100 mg PO daily for 1 week.                       - Return to physician assistant in 6 weeks.                       - The findings and recommendations were discussed with                        the patient. Procedure Code(s):    --- Professional ---                       [id number], Esophagogastroduodenoscopy, flexible, transoral;                         diagnostic, including collection of specimen(s) by                        brushing or washing, when performed (separate procedure)                       43450, Dilation of esophagus, by unguided sound or                        bougie, single or multiple passes Diagnosis Code(s):    --- Professional ---                       R13.10, Dysphagia, unspecified                       K21.9, Gastro-esophageal reflux disease without                        esophagitis                       K44.9, Diaphragmatic hernia without obstruction or                        gangrene                       K22.2, Esophageal obstruction                       K22.9, Disease of esophagus, unspecified CPT copyright 2019 American Medical Association. All rights reserved. The codes documented in this report are preliminary and upon coder review may  be revised to meet current compliance requirements. Efrain Sella MD, MD 09/22/2018 1:17:48 PM This report has been signed electronically. Number of Addenda: 0 Note Initiated On: 09/22/2018 12:17 PM      [facility]

## 2018-09-22 NOTE — H&P (Signed)
Outpatient short stay form Pre-procedure 09/22/2018 12:46 PM Latoya Stevenson K. Latoya Stevenson, M.D.  Primary Physician: Latoya Stevenson, M.D.  Reason for visit: Dysphagia  History of present illness: Patient is a pleasant 78 year old female with a history of intermittent solid food dysphagia.  This was last evaluated by modified barium swallow in March 2019.  The modified barium swallow showed normal oral pharyngeal swallowing function and no evidence of aspiration or laryngeal penetration.  At that time, the patient was advised to have a GI work-up endoscopically but this apparently did not occur. The patient called into our office the other day and spoke with Latoya Klippel, PA-C regarding dysphagia and was set up for EGD today.  Patient has had difficulty swallowing hashbrowns and other mildly solid food which appear to get hung up in the distal throat proximal sternum region.  There is been no weight loss or hematemesis.  No abdominal pain.    Current Facility-Administered Medications:  .  0.9 %  sodium chloride infusion, , Intravenous, Continuous, Maeser, Benay Pike, MD, Last Rate: 20 mL/hr at 09/22/18 1225  Medications Prior to Admission  Medication Sig Dispense Refill Last Dose  . amLODipine (NORVASC) 5 MG tablet Take 10 mg by mouth at bedtime.    09/21/2018 at Unknown time  . atorvastatin (LIPITOR) 40 MG tablet Take 40 mg by mouth at bedtime.    09/21/2018 at Unknown time  . Calcium Carbonate-Vitamin D (CALCIUM 600+D) 600-400 MG-UNIT tablet Take 2 tablets by mouth daily.   09/21/2018 at Unknown time  . chlorthalidone (HYGROTON) 25 MG tablet Take 25 mg by mouth daily.  1 09/21/2018 at Unknown time  . Cholecalciferol (VITAMIN D-3) 5000 UNITS TABS Take 5,000 Units by mouth daily.    09/21/2018 at Unknown time  . diphenhydrAMINE (SOMINEX) 25 MG tablet Take 25 mg by mouth at bedtime as needed for sleep. For sleep   09/21/2018 at Unknown time  . doxylamine, Sleep, (UNISOM) 25 MG tablet Take 25 mg by mouth at bedtime.     09/21/2018 at Unknown time  . fexofenadine (ALLEGRA) 180 MG tablet Take 180 mg by mouth at bedtime.    Past Week at Unknown time  . fluticasone (FLONASE) 50 MCG/ACT nasal spray Place 2 sprays into both nostrils at bedtime.    09/21/2018 at Unknown time  . Glucosamine-Chondroitin 750-600 MG TABS Take 1 tablet by mouth 2 (two) times daily.   Past Week at Unknown time  . levothyroxine (SYNTHROID, LEVOTHROID) 150 MCG tablet Take 150 mcg by mouth daily before breakfast.    09/21/2018 at Unknown time  . meclizine (ANTIVERT) 12.5 MG tablet Take 12.5 mg by mouth 3 (three) times daily as needed for dizziness.   Past Month at Unknown time  . Misc Natural Products (GLUCOSAMINE CHONDROITIN ADV PO) Take by mouth.   09/21/2018 at Unknown time  . Multiple Vitamin (MULTIVITAMIN WITH MINERALS) TABS Take 1 tablet by mouth daily.   Past Week at Unknown time  . nabumetone (RELAFEN) 500 MG tablet TAKE 1 TABLET BY MOUTH TWICE DAILY   09/21/2018 at Unknown time  . omeprazole (PRILOSEC) 20 MG capsule Take 20 mg by mouth daily before breakfast.    09/21/2018 at Unknown time  . vitamin E 400 UNIT capsule Take 400 Units by mouth daily.     09/21/2018 at Unknown time  . furosemide (LASIX) 20 MG tablet Take 20 mg by mouth daily.  11   . hydrochlorothiazide (HYDRODIURIL) 12.5 MG tablet Take by mouth.     . irbesartan (  AVAPRO) 300 MG tablet Take 300 mg by mouth daily.      . Vitamin D, Ergocalciferol, (DRISDOL) 50000 units CAPS capsule Take by mouth.   Not Taking at Unknown time     Allergies  Allergen Reactions  . Macrodantin [Nitrofurantoin Macrocrystal] Anaphylaxis, Hives and Swelling  . Penicillins Anaphylaxis and Swelling    PATIENT HAD A PCN REACTION WITH IMMEDIATE RASH, FACIAL/TONGUE/THROAT SWELLING, SOB, OR LIGHTHEADEDNESS WITH HYPOTENSION:  #  #  #  YES  #  #  #   Has patient had a PCN reaction causing severe rash involving mucus membranes or skin necrosis: No Has patient had a PCN reaction that required hospitalization:  No Has patient had a PCN reaction occurring within the last 10 years: No If all of the above answers are "NO", then may proceed with Cephalosporin use.   . Celebrex [Celecoxib] Swelling    SWELLING REACTION UNSPECIFIED   . Flagyl [Metronidazole Hcl] Swelling    SWELLING REACTION UNSPECIFIED   . Furosemide Itching     Past Medical History:  Diagnosis Date  . Allergic rhinitis   . Anemia    low iron  . Arthritis   . Asthma without status asthmaticus    unspecified  . Bilateral sciatica   . Bronchitis   . Cancer ([place])     follicular variant papillary thyroid cancer  . Chronic back pain    unspecified  . Chronic kidney disease   . Colon polyp    adenomatous  . DDD (degenerative disc disease), lumbar   . DJD (degenerative joint disease)   . Dysphagia   . Dyspnea   . Environmental allergies   . GERD (gastroesophageal reflux disease)   . Headache    migraines as a young woman  . History of recurrent UTIs   . Hyperlipidemia   . Hypertension   . Hypothyroidism   . IBS (irritable bowel syndrome)   . Lumbar disc disease   . Mold exposure 2002   hx of   . Osteoporosis, post-menopausal   . Pneumonia 1978  . Restless legs   . Scoliosis   . Spinal stenosis   . Thyroid nodule    aspirated in 1997 and 2003 which were negative  . Ulcer   . Varicose veins of left lower extremity   . Vertigo   . Wears dentures     Review of systems:  Otherwise negative.    Physical Exam  Gen: Alert, oriented. Appears stated age.  HEENT: [place]/AT. PERRLA. Lungs: CTA, no wheezes. CV: RR nl S1, S2. Abd: soft, benign, no masses. BS+ Ext: No edema. Pulses 2+    Planned procedures: Proceed with EGD with possible biopsy and esophageal dilatation. The patient understands the nature of the planned procedure, indications, risks, alternatives and potential complications including but not limited to bleeding, infection, perforation, damage to internal organs and possible oversedation/side effects  from anesthesia. The patient agrees and gives consent to proceed.  Please refer to procedure notes for findings, recommendations and patient disposition/instructions.     Latoya Stevenson K. Latoya Stevenson, M.D. Gastroenterology 09/22/2018  12:46 PM

## 2018-09-22 NOTE — Anesthesia Preprocedure Evaluation (Signed)
Anesthesia Evaluation  Patient identified by MRN, date of birth, ID band Patient awake    Reviewed: Allergy & Precautions, H&P , NPO status , Patient's Chart, lab work & pertinent test results  History of Anesthesia Complications Negative for: history of anesthetic complications  Airway Mallampati: III  TM Distance: <3 FB Neck ROM: limited    Dental  (+) Upper Dentures, Lower Dentures   Pulmonary shortness of breath and with exertion, asthma , pneumonia,           Cardiovascular Exercise Tolerance: Good hypertension, (-) angina(-) Past MI      Neuro/Psych  Headaches,  Neuromuscular disease negative psych ROS   GI/Hepatic Neg liver ROS, GERD  Medicated and Controlled,  Endo/Other  Hypothyroidism   Renal/GU Renal disease  negative genitourinary   Musculoskeletal  (+) Arthritis ,   Abdominal   Peds  Hematology negative hematology ROS (+)   Anesthesia Other Findings Past Medical History: No date: Allergic rhinitis No date: Anemia     Comment:  low iron No date: Arthritis No date: Asthma without status asthmaticus     Comment:  unspecified No date: Bilateral sciatica No date: Bronchitis No date: Cancer ([place])     Comment:   follicular variant papillary thyroid cancer No date: Chronic back pain     Comment:  unspecified No date: Chronic kidney disease No date: Colon polyp     Comment:  adenomatous No date: DDD (degenerative disc disease), lumbar No date: DJD (degenerative joint disease) No date: Dysphagia No date: Dyspnea No date: Environmental allergies No date: GERD (gastroesophageal reflux disease) No date: Headache     Comment:  migraines as a young woman No date: History of recurrent UTIs No date: Hyperlipidemia No date: Hypertension No date: Hypothyroidism No date: IBS (irritable bowel syndrome) No date: Lumbar disc disease 2002: Mold exposure     Comment:  hx of  No date: Osteoporosis,  post-menopausal 1978: Pneumonia No date: Restless legs No date: Scoliosis No date: Spinal stenosis No date: Thyroid nodule     Comment:  aspirated in 1997 and 2003 which were negative No date: Ulcer No date: Varicose veins of left lower extremity No date: Vertigo No date: Wears dentures  Past Surgical History: 1977: ABDOMINAL HYSTERECTOMY     Comment:  Dr. Laverta [place] 2003: ANKLE FRACTURE SURGERY; Left 09/09/2016: ANTERIOR LAT LUMBAR FUSION; Left     Comment:  Procedure: Left Lumbar two-three Anterolateral lumbar               interbody fusion with lateral plate;  Surgeon: Erline Levine, MD;  Location: [place];  Service: Neurosurgery;                Laterality: Left;  Left L2-3 Anterolateral lumbar               interbody fusion with lateral plate No date: APPENDECTOMY No date: BIOPSY THYROID     Comment:  x2  thyroid nodule ; Dr. Sharlet Salina and one by Dr. Sabra Heck 1970's: BREAST CYST ASPIRATION 1994: broken arm     Comment:  wrist fracture surgery, left No date: CARDIOVASCULAR STRESS TEST     Comment:  2010 2010: COLONOSCOPY 07/21/2013: COLONOSCOPY     Comment:  PHX OF CP/REPEAT 66YRS/[place] No date: EYE SURGERY; Bilateral     Comment:  cataract surgery with lens implant 11/18/2011: LUMBAR LAMINECTOMY/DECOMPRESSION MICRODISCECTOMY     Comment:  Procedure: LUMBAR LAMINECTOMY/DECOMPRESSION  MICRODISCECTOMY 2 LEVELS;  Surgeon: Erline Levine, MD;                Location: [place] NEUR[facility]S;  Service: Neurosurgery;                Laterality: N/A;  Lumbar Three-Four,  Lumbar Four-Five               Decompressive Laminectomy No date: OVARIAN CYST SURGERY 2016: POSTERIOR FUSION LUMBAR SPINE No date: TOTAL THYROIDECTOMY 2010: TRANSTHORACIC ECHOCARDIOGRAM 2010: upper and lower GI  BMI    Body Mass Index: 31.71 kg/m      Reproductive/Obstetrics negative OB ROS                             Anesthesia Physical Anesthesia Plan  ASA: III  Anesthesia  Plan: General   Post-op Pain Management:    Induction: Intravenous  PONV Risk Score and Plan: Propofol infusion and TIVA  Airway Management Planned: Natural Airway and Nasal Cannula  Additional Equipment:   Intra-op Plan:   Post-operative Plan:   Informed Consent: I have reviewed the patients History and Physical, chart, labs and discussed the procedure including the risks, benefits and alternatives for the proposed anesthesia with the patient or authorized representative who has indicated his/her understanding and acceptance.     Dental Advisory Given  Plan Discussed with: Anesthesiologist, CRNA and Surgeon  Anesthesia Plan Comments: (Patient consented for risks of anesthesia including but not limited to:  - adverse reactions to medications - risk of intubation if required - damage to teeth, lips or other oral mucosa - sore throat or hoarseness - Damage to heart, brain, lungs or loss of life  Patient voiced understanding.)        Anesthesia Quick Evaluation

## 2018-09-22 NOTE — Transfer of Care (Signed)
Immediate Anesthesia Transfer of Care Note  Patient: Latoya Stevenson  Procedure(s) Performed: ESOPHAGOGASTRODUODENOSCOPY (EGD) WITH PROPOFOL (N/A ) MALONEY DILATION (N/A )  Patient Location: PACU  Anesthesia Type:General  Level of Consciousness: awake and drowsy  Airway & Oxygen Therapy: Patient Spontanous Breathing and Patient connected to nasal cannula oxygen  Post-op Assessment: Report given to RN and Post -op Vital signs reviewed and stable  Post vital signs: stable  Last Vitals:  Vitals Value Taken Time  BP 116/70 09/22/18 1318  Temp 36.1 C 09/22/18 1318  Pulse 75 09/22/18 1320  Resp 21 09/22/18 1320  SpO2 100 % 09/22/18 1320  Vitals shown include unvalidated device data.  Last Pain:  Vitals:   09/22/18 1318  TempSrc: Tympanic  PainSc: Asleep         Complications: No apparent anesthesia complications

## 2018-09-22 NOTE — Anesthesia Post-op Follow-up Note (Signed)
Anesthesia QCDR form completed.        

## 2018-09-22 NOTE — Interval H&P Note (Signed)
History and Physical Interval Note:  09/22/2018 12:47 PM  Latoya Stevenson  has presented today for surgery, with the diagnosis of Dysphagia.  The various methods of treatment have been discussed with the patient and family. After consideration of risks, benefits and other options for treatment, the patient has consented to  Procedure(s) with comments: ESOPHAGOGASTRODUODENOSCOPY (EGD) WITH PROPOFOL (N/A) - Patient to have Rapid COVID-19 day of procedure Kieth Brightly approved this) MALONEY DILATION (N/A) as a surgical intervention.  [name] history has been reviewed, patient examined, no change in status, stable for surgery.  I have reviewed the patient's chart and labs.  Questions were answered to the patient's satisfaction.     [place], [place]

## 2018-09-23 NOTE — Anesthesia Postprocedure Evaluation (Signed)
Anesthesia Post Note  Patient: LILLIS NUTTLE  Procedure(s) Performed: ESOPHAGOGASTRODUODENOSCOPY (EGD) WITH PROPOFOL (N/A ) MALONEY DILATION (N/A )  Patient location during evaluation: Endoscopy Anesthesia Type: General Level of consciousness: awake and alert Pain management: pain level controlled Vital Signs Assessment: post-procedure vital signs reviewed and stable Respiratory status: spontaneous breathing, nonlabored ventilation, respiratory function stable and patient connected to nasal cannula oxygen Cardiovascular status: blood pressure returned to baseline and stable Postop Assessment: no apparent nausea or vomiting Anesthetic complications: no     Last Vitals:  Vitals:   09/22/18 1328 09/22/18 1338  BP: 123/85 (!) 162/91  Pulse: 74 74  Resp: 16 (!) 23  Temp:    SpO2: 97% 97%    Last Pain:  Vitals:   09/22/18 1338  TempSrc:   PainSc: 0-No pain                 Precious Haws Priscella Donna

## 2018-10-13 DIAGNOSIS — I10 Essential (primary) hypertension: Secondary | ICD-10-CM | POA: Diagnosis not present

## 2018-10-13 DIAGNOSIS — I83892 Varicose veins of left lower extremities with other complications: Secondary | ICD-10-CM | POA: Diagnosis not present

## 2018-11-02 DIAGNOSIS — E039 Hypothyroidism, unspecified: Secondary | ICD-10-CM | POA: Diagnosis not present

## 2018-11-03 DIAGNOSIS — K449 Diaphragmatic hernia without obstruction or gangrene: Secondary | ICD-10-CM | POA: Diagnosis not present

## 2018-11-03 DIAGNOSIS — B3781 Candidal esophagitis: Secondary | ICD-10-CM | POA: Diagnosis not present

## 2018-11-03 DIAGNOSIS — Z8719 Personal history of other diseases of the digestive system: Secondary | ICD-10-CM | POA: Diagnosis not present

## 2018-11-03 DIAGNOSIS — K219 Gastro-esophageal reflux disease without esophagitis: Secondary | ICD-10-CM | POA: Diagnosis not present

## 2018-11-09 DIAGNOSIS — G4700 Insomnia, unspecified: Secondary | ICD-10-CM | POA: Diagnosis not present

## 2018-11-09 DIAGNOSIS — I10 Essential (primary) hypertension: Secondary | ICD-10-CM | POA: Diagnosis not present

## 2018-11-09 DIAGNOSIS — E782 Mixed hyperlipidemia: Secondary | ICD-10-CM | POA: Diagnosis not present

## 2018-11-09 DIAGNOSIS — E039 Hypothyroidism, unspecified: Secondary | ICD-10-CM | POA: Diagnosis not present

## 2018-11-09 DIAGNOSIS — K219 Gastro-esophageal reflux disease without esophagitis: Secondary | ICD-10-CM | POA: Diagnosis not present

## 2018-12-30 ENCOUNTER — Other Ambulatory Visit: Payer: Self-pay | Admitting: Internal Medicine

## 2018-12-30 DIAGNOSIS — Z1231 Encounter for screening mammogram for malignant neoplasm of breast: Secondary | ICD-10-CM

## 2019-01-20 ENCOUNTER — Ambulatory Visit
Admission: RE | Admit: 2019-01-20 | Discharge: 2019-01-20 | Disposition: A | Discharge status: Home / Self Care | Payer: Medicare HMO | Source: Ambulatory Visit | Attending: Internal Medicine | Admitting: Internal Medicine

## 2019-01-20 DIAGNOSIS — Z1231 Encounter for screening mammogram for malignant neoplasm of breast: Secondary | ICD-10-CM | POA: Diagnosis not present

## 2019-02-04 ENCOUNTER — Ambulatory Visit (INDEPENDENT_AMBULATORY_CARE_PROVIDER_SITE_OTHER): Payer: Medicare Other | Admitting: Vascular Surgery

## 2019-03-01 DIAGNOSIS — I10 Essential (primary) hypertension: Secondary | ICD-10-CM | POA: Diagnosis not present

## 2019-03-01 DIAGNOSIS — E039 Hypothyroidism, unspecified: Secondary | ICD-10-CM | POA: Diagnosis not present

## 2019-03-08 DIAGNOSIS — K219 Gastro-esophageal reflux disease without esophagitis: Secondary | ICD-10-CM | POA: Diagnosis not present

## 2019-03-08 DIAGNOSIS — I10 Essential (primary) hypertension: Secondary | ICD-10-CM | POA: Diagnosis not present

## 2019-03-08 DIAGNOSIS — E039 Hypothyroidism, unspecified: Secondary | ICD-10-CM | POA: Diagnosis not present

## 2019-03-08 DIAGNOSIS — E782 Mixed hyperlipidemia: Secondary | ICD-10-CM | POA: Diagnosis not present

## 2019-03-08 DIAGNOSIS — Z23 Encounter for immunization: Secondary | ICD-10-CM | POA: Diagnosis not present

## 2019-03-18 HISTORY — PX: BREAST LUMPECTOMY: SHX2

## 2019-06-03 ENCOUNTER — Ambulatory Visit: Payer: Medicare Other

## 2019-06-16 ENCOUNTER — Other Ambulatory Visit: Payer: Self-pay | Admitting: Internal Medicine

## 2019-06-16 DIAGNOSIS — N6452 Nipple discharge: Secondary | ICD-10-CM

## 2019-06-21 ENCOUNTER — Other Ambulatory Visit: Payer: Self-pay | Admitting: Internal Medicine

## 2019-06-21 DIAGNOSIS — N6452 Nipple discharge: Secondary | ICD-10-CM

## 2019-07-01 ENCOUNTER — Ambulatory Visit
Admission: RE | Admit: 2019-07-01 | Discharge: 2019-07-01 | Disposition: A | Discharge status: Home / Self Care | Payer: Medicare Other | Source: Ambulatory Visit | Attending: Internal Medicine | Admitting: Internal Medicine

## 2019-07-01 DIAGNOSIS — N6452 Nipple discharge: Secondary | ICD-10-CM

## 2019-07-11 ENCOUNTER — Other Ambulatory Visit: Payer: Self-pay | Admitting: Internal Medicine

## 2019-07-11 DIAGNOSIS — N6452 Nipple discharge: Secondary | ICD-10-CM

## 2019-07-16 DIAGNOSIS — M1990 Unspecified osteoarthritis, unspecified site: Secondary | ICD-10-CM

## 2019-07-16 DIAGNOSIS — C50919 Malignant neoplasm of unspecified site of unspecified female breast: Secondary | ICD-10-CM

## 2019-07-16 HISTORY — DX: Unspecified osteoarthritis, unspecified site: M19.90

## 2019-07-16 HISTORY — DX: Malignant neoplasm of unspecified site of unspecified female breast: C50.919

## 2019-07-18 ENCOUNTER — Other Ambulatory Visit
Admission: RE | Admit: 2019-07-18 | Discharge: 2019-07-18 | Disposition: A | Discharge status: Home / Self Care | Payer: Medicare Other | Source: Ambulatory Visit | Attending: Family Medicine | Admitting: Family Medicine

## 2019-07-18 DIAGNOSIS — M7989 Other specified soft tissue disorders: Secondary | ICD-10-CM | POA: Diagnosis present

## 2019-07-18 DIAGNOSIS — R0600 Dyspnea, unspecified: Secondary | ICD-10-CM | POA: Insufficient documentation

## 2019-07-18 LAB — BRAIN NATRIURETIC PEPTIDE: B Natriuretic Peptide: 59 pg/mL (ref 0.0–100.0)

## 2019-07-25 ENCOUNTER — Ambulatory Visit
Admission: RE | Admit: 2019-07-25 | Discharge: 2019-07-25 | Disposition: A | Discharge status: Home / Self Care | Payer: Medicare Other | Source: Ambulatory Visit | Attending: Internal Medicine | Admitting: Internal Medicine

## 2019-07-25 ENCOUNTER — Other Ambulatory Visit: Payer: Self-pay

## 2019-07-25 DIAGNOSIS — N6452 Nipple discharge: Secondary | ICD-10-CM

## 2019-07-25 MED ORDER — GADOBUTROL 1 MMOL/ML IV SOLN
8.0000 mL | Freq: Once | INTRAVENOUS | Status: AC | PRN
  Administered 2019-07-25: 09:00:00 8 mL via INTRAVENOUS

## 2019-07-27 ENCOUNTER — Other Ambulatory Visit: Payer: Self-pay | Admitting: Internal Medicine

## 2019-07-27 DIAGNOSIS — N6459 Other signs and symptoms in breast: Secondary | ICD-10-CM

## 2019-07-27 DIAGNOSIS — N6320 Unspecified lump in the left breast, unspecified quadrant: Secondary | ICD-10-CM

## 2019-08-02 ENCOUNTER — Ambulatory Visit
Admission: RE | Admit: 2019-08-02 | Discharge: 2019-08-02 | Disposition: A | Discharge status: Home / Self Care | Payer: Medicare Other | Source: Ambulatory Visit | Attending: Internal Medicine | Admitting: Internal Medicine

## 2019-08-02 DIAGNOSIS — N6459 Other signs and symptoms in breast: Secondary | ICD-10-CM

## 2019-08-02 DIAGNOSIS — N6320 Unspecified lump in the left breast, unspecified quadrant: Secondary | ICD-10-CM

## 2019-08-02 DIAGNOSIS — C50312 Malignant neoplasm of 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: Secondary | ICD-10-CM | POA: Diagnosis not present

## 2019-08-02 HISTORY — PX: BREAST BIOPSY: SHX20

## 2019-08-03 ENCOUNTER — Encounter: Payer: Self-pay | Admitting: *Deleted

## 2019-08-03 ENCOUNTER — Other Ambulatory Visit: Payer: Self-pay | Admitting: Anatomic Pathology & Clinical Pathology

## 2019-08-03 DIAGNOSIS — C50912 Malignant neoplasm of unspecified site of left female breast: Secondary | ICD-10-CM

## 2019-08-03 NOTE — Progress Notes (Signed)
Called patient today to establish navigation services.  She is newly diagnosed with invasive mammary carcinoma of the left breast.  Patient wanted to see Dr. Bary Castilla for medical oncology since her sister was seen by him, but he does not accept her insurance.  She has chosen to see Dr. Christian Mate for her surgical consult and is scheduled to see him tomorrow at 1:30.  She is scheduled to see Dr. Janese Banks on 5/25 for medical oncology consult.  Patient with significant family history of breast and pancreatic cancer.  History as follows: mom with breast cancer at 83, sister with breast cancer in her 32's, sister with pancreatic cancer at 17, brother with pancreatic cancer, brother with prostate and colon cancer.  I did discuss that Dr. Janese Banks may mention genetic testing to her.  Will meet with her next week and give educational literature at that time.  She is to call with any questions or needs.

## 2019-08-04 ENCOUNTER — Encounter: Payer: Self-pay | Admitting: Surgery

## 2019-08-04 ENCOUNTER — Ambulatory Visit: Payer: Self-pay | Admitting: Surgery

## 2019-08-04 ENCOUNTER — Ambulatory Visit: Payer: Medicare Other | Admitting: Surgery

## 2019-08-04 ENCOUNTER — Other Ambulatory Visit: Payer: Self-pay

## 2019-08-04 VITALS — BP 158/90 | HR 80 | Temp 96.6°F | Ht 63.0 in | Wt 179.0 lb

## 2019-08-04 DIAGNOSIS — C50312 Malignant neoplasm of 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: Secondary | ICD-10-CM | POA: Diagnosis not present

## 2019-08-04 NOTE — H&P (View-Only) (Signed)
Patient ID: Latoya Stevenson, female   DOB: 08/09/1940, 79 y.o.   MRN: [id number]  Chief Complaint: Left breast cancer.  History of Present Illness Latoya Stevenson is a 79 y.o. female with history of right nipple discharge, imaging work-up initiated.  MRI completed after nonspecific findings on mammography.  Follow-up focal ultrasound looked at MRI detected lesion on left breast, resulting in a core biopsy.  At 8:00 on the left breast about 3 cm from the nipple an 8 mm lesion was identified; core biopsy on May 18 revealed an invasive mammary carcinoma grade 2.  Prognostic indicators pending.  Ultrasound showed no abnormal lymph nodes of the left axilla.  She has a personal history of thyroid cancer and underwent radiation.  Her mother had breast cancer at age 4.  Her maternal aunt was diagnosed and died of ovarian cancer.  She took birth control for years and estrogen hormone therapy possibly decades.  She had 3 pregnancies having her first child at the age of 28.  She has had no breast masses pain or skin changes.  Past Medical History Past Medical History:  Diagnosis Date  . Allergic rhinitis   . Anemia    low iron  . Arthritis   . Asthma without status asthmaticus    unspecified  . Bilateral sciatica   . Bronchitis   . Cancer ([place])     follicular variant papillary thyroid cancer  . Chronic back pain    unspecified  . Chronic kidney disease   . Colon polyp    adenomatous  . DDD (degenerative disc disease), lumbar   . DJD (degenerative joint disease)   . Dysphagia   . Dyspnea   . Environmental allergies   . GERD (gastroesophageal reflux disease)   . Headache    migraines as a young woman  . History of recurrent UTIs   . Hyperlipidemia   . Hypertension   . Hypothyroidism   . IBS (irritable bowel syndrome)   . Lumbar disc disease   . Mold exposure 2002   hx of   . Osteoporosis, post-menopausal   . Pneumonia 1978  . Restless legs   . Scoliosis   . Spinal stenosis   . Thyroid  nodule    aspirated in 1997 and 2003 which were negative  . Ulcer   . Varicose veins of left lower extremity   . Vertigo   . Wears dentures       Past Surgical History:  Procedure Laterality Date  . ABDOMINAL HYSTERECTOMY  1977   Dr. Laverta Baltimore  . ANKLE FRACTURE SURGERY Left 2003  . ANTERIOR LAT LUMBAR FUSION Left 09/09/2016   Procedure: Left Lumbar two-three Anterolateral lumbar interbody fusion with lateral plate;  Surgeon: Erline Levine, MD;  Location: [facility];  Service: Neurosurgery;  Laterality: Left;  Left L2-3 Anterolateral lumbar interbody fusion with lateral plate  . APPENDECTOMY    . BIOPSY THYROID     x2  thyroid nodule ; Dr. Sharlet Salina and one by Dr. Sabra Heck  . BREAST BIOPSY Left 08/02/2019   coil clip path pending  . BREAST CYST ASPIRATION  1970's  . broken arm  1994   wrist fracture surgery, left  . CARDIOVASCULAR STRESS TEST     2010  . COLONOSCOPY  2010  . COLONOSCOPY  07/21/2013   PHX OF CP/REPEAT 82YRS/[place]  . ESOPHAGOGASTRODUODENOSCOPY (EGD) WITH PROPOFOL N/A 09/22/2018   Procedure: ESOPHAGOGASTRODUODENOSCOPY (EGD) WITH PROPOFOL;  Surgeon: Toledo, Benay Pike, MD;  Location: [facility];  Service:  Gastroenterology;  Laterality: N/A;  Patient to have Rapid COVID-19 day of procedure Kieth Brightly approved this)  . EYE SURGERY Bilateral    cataract surgery with lens implant  . LUMBAR LAMINECTOMY/DECOMPRESSION MICRODISCECTOMY  11/18/2011   Procedure: LUMBAR LAMINECTOMY/DECOMPRESSION MICRODISCECTOMY 2 LEVELS;  Surgeon: Erline Levine, MD;  Location: [place] [facility];  Service: Neurosurgery;  Laterality: N/A;  Lumbar Three-Four,  Lumbar Four-Five Decompressive Laminectomy  . MALONEY DILATION N/A 09/22/2018   Procedure: MALONEY DILATION;  Surgeon: Toledo, Benay Pike, MD;  Location: [facility];  Service: Gastroenterology;  Laterality: N/A;  . OVARIAN CYST SURGERY    . POSTERIOR FUSION LUMBAR SPINE  2016  . TOTAL THYROIDECTOMY    . TRANSTHORACIC ECHOCARDIOGRAM  2010  . upper and lower GI  2010     Allergies  Allergen Reactions  . Macrodantin [Nitrofurantoin Macrocrystal] Anaphylaxis, Hives and Swelling  . Penicillins Anaphylaxis and Swelling    PATIENT HAD A PCN REACTION WITH IMMEDIATE RASH, FACIAL/TONGUE/THROAT SWELLING, SOB, OR LIGHTHEADEDNESS WITH HYPOTENSION:  #  #  #  YES  #  #  #   Has patient had a PCN reaction causing severe rash involving mucus membranes or skin necrosis: No Has patient had a PCN reaction that required hospitalization: No Has patient had a PCN reaction occurring within the last 10 years: No If all of the above answers are "NO", then may proceed with Cephalosporin use.   . Celebrex [Celecoxib] Swelling    SWELLING REACTION UNSPECIFIED   . Flagyl [Metronidazole Hcl] Swelling    SWELLING REACTION UNSPECIFIED   . Furosemide Itching    Current Outpatient Medications  Medication Sig Dispense Refill  . amLODipine (NORVASC) 5 MG tablet Take 10 mg by mouth at bedtime.     Marland Kitchen atorvastatin (LIPITOR) 40 MG tablet Take 40 mg by mouth at bedtime.     . Calcium Carbonate-Vitamin D (CALCIUM 600+D) 600-400 MG-UNIT tablet Take 2 tablets by mouth daily.    . chlorthalidone (HYGROTON) 25 MG tablet Take 25 mg by mouth daily.  1  . Cholecalciferol (VITAMIN D-3) 5000 UNITS TABS Take 5,000 Units by mouth daily.     . diphenhydrAMINE (SOMINEX) 25 MG tablet Take 25 mg by mouth at bedtime as needed for sleep. For sleep    . doxylamine, Sleep, (UNISOM) 25 MG tablet Take 25 mg by mouth at bedtime.     . fexofenadine (ALLEGRA) 180 MG tablet Take 180 mg by mouth at bedtime.     . fluticasone (FLONASE) 50 MCG/ACT nasal spray Place 2 sprays into both nostrils at bedtime.     . Glucosamine-Chondroitin 750-600 MG TABS Take 1 tablet by mouth 2 (two) times daily.    . irbesartan (AVAPRO) 300 MG tablet Take 300 mg by mouth daily.     Marland Kitchen levothyroxine (SYNTHROID) 175 MCG tablet Take 175 mcg by mouth every morning.    . meclizine (ANTIVERT) 12.5 MG tablet Take 12.5 mg by mouth 3 (three)  times daily as needed for dizziness.    . Misc Natural Products (GLUCOSAMINE CHONDROITIN ADV PO) Take by mouth.    . Multiple Vitamin (MULTIVITAMIN WITH MINERALS) TABS Take 1 tablet by mouth daily.    . nabumetone (RELAFEN) 500 MG tablet TAKE 1 TABLET BY MOUTH TWICE DAILY    . omeprazole (PRILOSEC) 20 MG capsule Take 20 mg by mouth daily before breakfast.     . Vitamin D, Ergocalciferol, (DRISDOL) 50000 units CAPS capsule Take by mouth.    . vitamin E 400 UNIT capsule Take  400 Units by mouth daily.      . furosemide (LASIX) 20 MG tablet Take 20 mg by mouth daily.  11  . hydrochlorothiazide (HYDRODIURIL) 12.5 MG tablet Take by mouth.     No current facility-administered medications for this visit.    Family History Family History  Problem Relation Age of Onset  . Cancer Mother        breast  . Breast cancer Mother 7  . Cancer Brother   . Cancer Sister        pancreatic  . Suicidality Father   . Breast cancer Sister        x 2 times  . Heart attack Sister       Social History Social History   Tobacco Use  . Smoking status: Never Smoker  . Smokeless tobacco: Never Used  Substance Use Topics  . Alcohol use: No    Comment: socially  . Drug use: No        Review of Systems  Constitutional: Negative for weight loss.  HENT: Negative.   Eyes: Negative.   Respiratory: Negative for cough and shortness of breath.   Cardiovascular: Negative for chest pain.  Gastrointestinal: Negative.   Genitourinary: Negative.   Musculoskeletal: Negative for back pain, joint pain and neck pain.  Skin: Negative.   Neurological: Negative.   Endo/Heme/Allergies: Negative.      Physical Exam Blood pressure (!) 158/90, pulse 80, temperature (!) 96.6 F (35.9 C), temperature source Temporal, height 5' 3"  (1.6 m), weight 179 lb (81.2 kg), SpO2 95 %. Last Weight  Most recent update: 08/04/2019  1:55 PM   Weight  81.2 kg (179 lb)            CONSTITUTIONAL: Well developed, and nourished,  appropriately responsive and aware without distress.   EYES: Sclera non-icteric.   EARS, NOSE, MOUTH AND THROAT: Mask worn.  Hearing is intact to voice.  NECK: Trachea is midline, and there is no jugular venous distension.  LYMPH NODES:  Lymph nodes in the neck are not enlarged. RESPIRATORY:  Lungs are clear, and breath sounds are equal bilaterally. Normal respiratory effort without pathologic use of accessory muscles. CARDIOVASCULAR: Heart is regular in rate and rhythm. GI: The abdomen is soft, nontender, and nondistended. There were no palpable masses. I did not appreciate hepatosplenomegaly. There were normal bowel sounds. GU: Bilateral breast exam unremarkable, aside for ecchymosis and vague changes in the left breast. MUSCULOSKELETAL:  Symmetrical muscle tone appreciated in all four extremities.    SKIN: Skin turgor is normal. No pathologic skin lesions appreciated.  NEUROLOGIC:  Motor and sensation appear grossly normal.  Cranial nerves are grossly without defect. PSYCH:  Alert and oriented to person, place and time. Affect is appropriate for situation.  Data Reviewed I have personally reviewed what is currently available of the patient's imaging, recent labs and medical records.   Labs:  CBC Latest Ref Rng & Units 11/27/2016 09/09/2016 10/28/2014  WBC 4.0 - 10.5 K/uL 7.2 5.8 9.7  Hemoglobin 12.0 - 15.0 g/dL 11.1(L) 14.0 11.4(L)  Hematocrit 36.0 - 46.0 % 35.6(L) 43.2 35.3(L)  Platelets 150 - 400 K/uL 224 150 133(L)   CMP Latest Ref Rng & Units 11/27/2016 09/09/2016 10/18/2014  Glucose 65 - 99 mg/dL 103(H) 100(H) 90  BUN 6 - 20 mg/dL 18 19 17   Creatinine 0.44 - 1.00 mg/dL 0.90 1.26(H) 0.90  Sodium 135 - 145 mmol/L 136 138 140  Potassium 3.5 - 5.1 mmol/L 4.0 5.0 4.2  Chloride  101 - 111 mmol/L 106 107 108  CO2 22 - 32 mmol/L 23 20(L) 25  Calcium 8.9 - 10.3 mg/dL 9.0 9.2 9.2      Imaging: Radiology review: Images reviewed. Within last 24 hrs: No results found.  Assessment    Left  breast cancer.  Invasive mammary type grade 2.  Prognostic indicators pending. Patient Active Problem List   Diagnosis Date Noted  . Hyperlipidemia 02/03/2018  . Lymphedema 10/14/2017  . Hypertension 08/19/2017  . Swelling of limb 08/19/2017  . Pain in limb 08/19/2017  . Lumbar compression fracture ([place]) 12/02/2016  . 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 Lumbar scoliosis 10/27/2014    Plan    Should her prognostic indicators indicate estrogen receptor positivity with HER-2 negativity, I believe we can forego a sentinel lymph node biopsy.  Left RF ID localized breast lumpectomy.  I discussed the available options with the patient. The risk of recurrence is similar between mastectomy and lumpectomy with radiation.  I also discussed that given the small size of the cancer would recommend localization lumpectomy with radiation to follow.  I also discussed that we would need to do a sentinel lymph node biopsy to check the nodes, if her prognostic indicators were not favorable.   Explained to the patient that after her surgical treatment additional treatment will depend on her prognostic indicators and stage.    I discussed risks of bleeding, infection, damage to surrounding tissues, having positive margins, needing further resection, damage to nerves causing arm numbness or difficulty raising arm, causing lymphedema in the arm; as well as anesthesia risks of MI, stroke, prolonged ventilation, pulmonary embolism, thrombosis and even death.   Patient was given the opportunity to ask questions and have them answered.  They would like to proceed with left breast RFID localized lumpectomy with later determination of proceeding with the sentinel lymph node biopsy.   Face-to-face time spent with the patient and accompanying care providers(if present) was 35 minutes, with more than 50% of the time spent counseling, educating, and coordinating care of the patient.      Ronny Bacon  M.D., FACS 08/04/2019, 2:23 PM

## 2019-08-04 NOTE — Progress Notes (Signed)
Patient ID: Latoya Stevenson, female   DOB: 13-May-1940, 79 y.o.   MRN: [id number]  Chief Complaint: Left breast cancer.  History of Present Illness Latoya Stevenson is a 79 y.o. female with history of right nipple discharge, imaging work-up initiated.  MRI completed after nonspecific findings on mammography.  Follow-up focal ultrasound looked at MRI detected lesion on left breast, resulting in a core biopsy.  At 8:00 on the left breast about 3 cm from the nipple an 8 mm lesion was identified; core biopsy on May 18 revealed an invasive mammary carcinoma grade 2.  Prognostic indicators pending.  Ultrasound showed no abnormal lymph nodes of the left axilla.  She has a personal history of thyroid cancer and underwent radiation.  Her mother had breast cancer at age 91.  Her maternal aunt was diagnosed and died of ovarian cancer.  She took birth control for years and estrogen hormone therapy possibly decades.  She had 3 pregnancies having her first child at the age of 89.  She has had no breast masses pain or skin changes.  Past Medical History Past Medical History:  Diagnosis Date  . Allergic rhinitis   . Anemia    low iron  . Arthritis   . Asthma without status asthmaticus    unspecified  . Bilateral sciatica   . Bronchitis   . Cancer ([place])     follicular variant papillary thyroid cancer  . Chronic back pain    unspecified  . Chronic kidney disease   . Colon polyp    adenomatous  . DDD (degenerative disc disease), lumbar   . DJD (degenerative joint disease)   . Dysphagia   . Dyspnea   . Environmental allergies   . GERD (gastroesophageal reflux disease)   . Headache    migraines as a young woman  . History of recurrent UTIs   . Hyperlipidemia   . Hypertension   . Hypothyroidism   . IBS (irritable bowel syndrome)   . Lumbar disc disease   . Mold exposure 2002   hx of   . Osteoporosis, post-menopausal   . Pneumonia 1978  . Restless legs   . Scoliosis   . Spinal stenosis   . Thyroid  nodule    aspirated in 1997 and 2003 which were negative  . Ulcer   . Varicose veins of left lower extremity   . Vertigo   . Wears dentures       Past Surgical History:  Procedure Laterality Date  . ABDOMINAL HYSTERECTOMY  1977   Dr. Laverta Baltimore  . ANKLE FRACTURE SURGERY Left 2003  . ANTERIOR LAT LUMBAR FUSION Left 09/09/2016   Procedure: Left Lumbar two-three Anterolateral lumbar interbody fusion with lateral plate;  Surgeon: Erline Levine, MD;  Location: [place];  Service: Neurosurgery;  Laterality: Left;  Left L2-3 Anterolateral lumbar interbody fusion with lateral plate  . APPENDECTOMY    . BIOPSY THYROID     x2  thyroid nodule ; Dr. Sharlet Salina and one by Dr. Sabra Heck  . BREAST BIOPSY Left 08/02/2019   coil clip path pending  . BREAST CYST ASPIRATION  1970's  . broken arm  1994   wrist fracture surgery, left  . CARDIOVASCULAR STRESS TEST     2010  . COLONOSCOPY  2010  . COLONOSCOPY  07/21/2013   PHX OF CP/REPEAT 38YRS/[place]  . ESOPHAGOGASTRODUODENOSCOPY (EGD) WITH PROPOFOL N/A 09/22/2018   Procedure: ESOPHAGOGASTRODUODENOSCOPY (EGD) WITH PROPOFOL;  Surgeon: Toledo, Benay Pike, MD;  Location: [facility];  Service:  Gastroenterology;  Laterality: N/A;  Patient to have Rapid COVID-19 day of procedure Kieth Brightly approved this)  . EYE SURGERY Bilateral    cataract surgery with lens implant  . LUMBAR LAMINECTOMY/DECOMPRESSION MICRODISCECTOMY  11/18/2011   Procedure: LUMBAR LAMINECTOMY/DECOMPRESSION MICRODISCECTOMY 2 LEVELS;  Surgeon: Erline Levine, MD;  Location: [place] [facility];  Service: Neurosurgery;  Laterality: N/A;  Lumbar Three-Four,  Lumbar Four-Five Decompressive Laminectomy  . MALONEY DILATION N/A 09/22/2018   Procedure: MALONEY DILATION;  Surgeon: Toledo, Benay Pike, MD;  Location: [facility];  Service: Gastroenterology;  Laterality: N/A;  . OVARIAN CYST SURGERY    . POSTERIOR FUSION LUMBAR SPINE  2016  . TOTAL THYROIDECTOMY    . TRANSTHORACIC ECHOCARDIOGRAM  2010  . upper and lower GI  2010     Allergies  Allergen Reactions  . Macrodantin [Nitrofurantoin Macrocrystal] Anaphylaxis, Hives and Swelling  . Penicillins Anaphylaxis and Swelling    PATIENT HAD A PCN REACTION WITH IMMEDIATE RASH, FACIAL/TONGUE/THROAT SWELLING, SOB, OR LIGHTHEADEDNESS WITH HYPOTENSION:  #  #  #  YES  #  #  #   Has patient had a PCN reaction causing severe rash involving mucus membranes or skin necrosis: No Has patient had a PCN reaction that required hospitalization: No Has patient had a PCN reaction occurring within the last 10 years: No If all of the above answers are "NO", then may proceed with Cephalosporin use.   . Celebrex [Celecoxib] Swelling    SWELLING REACTION UNSPECIFIED   . Flagyl [Metronidazole Hcl] Swelling    SWELLING REACTION UNSPECIFIED   . Furosemide Itching    Current Outpatient Medications  Medication Sig Dispense Refill  . amLODipine (NORVASC) 5 MG tablet Take 10 mg by mouth at bedtime.     Marland Kitchen atorvastatin (LIPITOR) 40 MG tablet Take 40 mg by mouth at bedtime.     . Calcium Carbonate-Vitamin D (CALCIUM 600+D) 600-400 MG-UNIT tablet Take 2 tablets by mouth daily.    . chlorthalidone (HYGROTON) 25 MG tablet Take 25 mg by mouth daily.  1  . Cholecalciferol (VITAMIN D-3) 5000 UNITS TABS Take 5,000 Units by mouth daily.     . diphenhydrAMINE (SOMINEX) 25 MG tablet Take 25 mg by mouth at bedtime as needed for sleep. For sleep    . doxylamine, Sleep, (UNISOM) 25 MG tablet Take 25 mg by mouth at bedtime.     . fexofenadine (ALLEGRA) 180 MG tablet Take 180 mg by mouth at bedtime.     . fluticasone (FLONASE) 50 MCG/ACT nasal spray Place 2 sprays into both nostrils at bedtime.     . Glucosamine-Chondroitin 750-600 MG TABS Take 1 tablet by mouth 2 (two) times daily.    . irbesartan (AVAPRO) 300 MG tablet Take 300 mg by mouth daily.     Marland Kitchen levothyroxine (SYNTHROID) 175 MCG tablet Take 175 mcg by mouth every morning.    . meclizine (ANTIVERT) 12.5 MG tablet Take 12.5 mg by mouth 3 (three)  times daily as needed for dizziness.    . Misc Natural Products (GLUCOSAMINE CHONDROITIN ADV PO) Take by mouth.    . Multiple Vitamin (MULTIVITAMIN WITH MINERALS) TABS Take 1 tablet by mouth daily.    . nabumetone (RELAFEN) 500 MG tablet TAKE 1 TABLET BY MOUTH TWICE DAILY    . omeprazole (PRILOSEC) 20 MG capsule Take 20 mg by mouth daily before breakfast.     . Vitamin D, Ergocalciferol, (DRISDOL) 50000 units CAPS capsule Take by mouth.    . vitamin E 400 UNIT capsule Take  400 Units by mouth daily.      . furosemide (LASIX) 20 MG tablet Take 20 mg by mouth daily.  11  . hydrochlorothiazide (HYDRODIURIL) 12.5 MG tablet Take by mouth.     No current facility-administered medications for this visit.    Family History Family History  Problem Relation Age of Onset  . Cancer Mother        breast  . Breast cancer Mother 19  . Cancer Brother   . Cancer Sister        pancreatic  . Suicidality Father   . Breast cancer Sister        x 2 times  . Heart attack Sister       Social History Social History   Tobacco Use  . Smoking status: Never Smoker  . Smokeless tobacco: Never Used  Substance Use Topics  . Alcohol use: No    Comment: socially  . Drug use: No        Review of Systems  Constitutional: Negative for weight loss.  HENT: Negative.   Eyes: Negative.   Respiratory: Negative for cough and shortness of breath.   Cardiovascular: Negative for chest pain.  Gastrointestinal: Negative.   Genitourinary: Negative.   Musculoskeletal: Negative for back pain, joint pain and neck pain.  Skin: Negative.   Neurological: Negative.   Endo/Heme/Allergies: Negative.      Physical Exam Blood pressure (!) 158/90, pulse 80, temperature (!) 96.6 F (35.9 C), temperature source Temporal, height 5' 3"  (1.6 m), weight 179 lb (81.2 kg), SpO2 95 %. Last Weight  Most recent update: 08/04/2019  1:55 PM   Weight  81.2 kg (179 lb)            CONSTITUTIONAL: Well developed, and nourished,  appropriately responsive and aware without distress.   EYES: Sclera non-icteric.   EARS, NOSE, MOUTH AND THROAT: Mask worn.  Hearing is intact to voice.  NECK: Trachea is midline, and there is no jugular venous distension.  LYMPH NODES:  Lymph nodes in the neck are not enlarged. RESPIRATORY:  Lungs are clear, and breath sounds are equal bilaterally. Normal respiratory effort without pathologic use of accessory muscles. CARDIOVASCULAR: Heart is regular in rate and rhythm. GI: The abdomen is soft, nontender, and nondistended. There were no palpable masses. I did not appreciate hepatosplenomegaly. There were normal bowel sounds. GU: Bilateral breast exam unremarkable, aside for ecchymosis and vague changes in the left breast. MUSCULOSKELETAL:  Symmetrical muscle tone appreciated in all four extremities.    SKIN: Skin turgor is normal. No pathologic skin lesions appreciated.  NEUROLOGIC:  Motor and sensation appear grossly normal.  Cranial nerves are grossly without defect. PSYCH:  Alert and oriented to person, place and time. Affect is appropriate for situation.  Data Reviewed I have personally reviewed what is currently available of the patient's imaging, recent labs and medical records.   Labs:  CBC Latest Ref Rng & Units 11/27/2016 09/09/2016 10/28/2014  WBC 4.0 - 10.5 K/uL 7.2 5.8 9.7  Hemoglobin 12.0 - 15.0 g/dL 11.1(L) 14.0 11.4(L)  Hematocrit 36.0 - 46.0 % 35.6(L) 43.2 35.3(L)  Platelets 150 - 400 K/uL 224 150 133(L)   CMP Latest Ref Rng & Units 11/27/2016 09/09/2016 10/18/2014  Glucose 65 - 99 mg/dL 103(H) 100(H) 90  BUN 6 - 20 mg/dL 18 19 17   Creatinine 0.44 - 1.00 mg/dL 0.90 1.26(H) 0.90  Sodium 135 - 145 mmol/L 136 138 140  Potassium 3.5 - 5.1 mmol/L 4.0 5.0 4.2  Chloride  101 - 111 mmol/L 106 107 108  CO2 22 - 32 mmol/L 23 20(L) 25  Calcium 8.9 - 10.3 mg/dL 9.0 9.2 9.2      Imaging: Radiology review: Images reviewed. Within last 24 hrs: No results found.  Assessment    Left  breast cancer.  Invasive mammary type grade 2.  Prognostic indicators pending. Patient Active Problem List   Diagnosis Date Noted  . Hyperlipidemia 02/03/2018  . Lymphedema 10/14/2017  . Hypertension 08/19/2017  . Swelling of limb 08/19/2017  . Pain in limb 08/19/2017  . Lumbar compression fracture ([place]) 12/02/2016  . 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 Lumbar scoliosis 10/27/2014    Plan    Should her prognostic indicators indicate estrogen receptor positivity with HER-2 negativity, I believe we can forego a sentinel lymph node biopsy.  Left RF ID localized breast lumpectomy.  I discussed the available options with the patient. The risk of recurrence is similar between mastectomy and lumpectomy with radiation.  I also discussed that given the small size of the cancer would recommend localization lumpectomy with radiation to follow.  I also discussed that we would need to do a sentinel lymph node biopsy to check the nodes, if her prognostic indicators were not favorable.   Explained to the patient that after her surgical treatment additional treatment will depend on her prognostic indicators and stage.    I discussed risks of bleeding, infection, damage to surrounding tissues, having positive margins, needing further resection, damage to nerves causing arm numbness or difficulty raising arm, causing lymphedema in the arm; as well as anesthesia risks of MI, stroke, prolonged ventilation, pulmonary embolism, thrombosis and even death.   Patient was given the opportunity to ask questions and have them answered.  They would like to proceed with left breast RFID localized lumpectomy with later determination of proceeding with the sentinel lymph node biopsy.   Face-to-face time spent with the patient and accompanying care providers(if present) was 35 minutes, with more than 50% of the time spent counseling, educating, and coordinating care of the patient.      Ronny Bacon  M.D., FACS 08/04/2019, 2:23 PM

## 2019-08-04 NOTE — Patient Instructions (Addendum)
Our surgery scheduler Pamala Hurry will contact you within 24-48 hours to get schedule. During that call, she will discuss the preparation prior to surgery. She will also discuss the different dates and times for surgery. Please have the BLUE sheet available when she contacts you. If you have any questions or concerns, please feel free to contact our office.  Breast Cancer, Female  Breast cancer is a malignant growth of tissue (tumor) in the breast. Unlike noncancerous (benign) tumors, malignant tumors are cancerous and can spread to other parts of the body. The two most common types of breast cancer start in the milk ducts (ductal carcinoma) or in the lobules where milk is made in the breast (lobular carcinoma). Breast cancer is one of the most common types of cancer in women. What are the causes? The exact cause of female breast cancer is unknown. What increases the risk? The following factors may make you more likely to develop this condition:  Being older than 78 years of age.  Race and ethnicity. Caucasian women generally have an increased risk, but African-American women are more likely to develop the disease before age 79.  Having a family history of breast cancer.  Having had breast cancer in the past.  Having certain noncancerous conditions of the breast, such as dense breast tissue.  Having the BRCA1 and BRCA2 genes.  Having a history of radiation exposure.  Obesity.  Starting menopause after age 12.  Starting your menstrual periods before age 70.  Having never been pregnant or having your first child after age 36.  Having never breastfed.  Using hormone therapy after menopause.  Using birth control pills.  Drinking more than one alcoholic drink a day.  Exposure to the drug DES, which was given to pregnant women from the 1940s to the 1970s. What are the signs or symptoms? Symptoms of this condition include:  A painless lump or thickening in your breast.  Changes in  the size or shape of your breast.  Breast skin changes, such as puckering or dimpling.  Nipple abnormalities, such as scaling, crustiness, redness, or pulling in (retraction).  Nipple discharge that is bloody or clear. How is this diagnosed? This condition may be diagnosed by:  Taking your medical history and doing a physical exam. During the exam, your health care provider will feel the tissue around your breast and under your arms.  Taking a sample of nipple discharge. The sample will be examined under a microscope.  Performing imaging tests, such as breast X-rays (mammogram), breast ultrasound exams, or an MRI.  Taking a tissue sample (biopsy) from the breast. The sample will be examined under a microscope to look for cancer cells.  Taking a sample from the lymph nodes near the affected breast (sentinel node biopsy). Your cancer will be staged to determine its severity and extent. Staging is a careful attempt to find out the size of the tumor, whether the cancer has spread, and if so, to what parts of the body. Staging also includes testing your tumor for certain receptors, such as estrogen, progesterone, and human epidermal growth factor receptor 2 (HER2). This will help your cancer care team decide on a treatment that will work best for you. You may need to have more tests to determine the stage of your cancer. Stages include the following:  Stage 0--The tumor has not spread to other breast tissue.  Stage I--The cancer is only found in the breast or may be in the lymph nodes. The tumor may be  up to  in (2 cm) wide.  Stage II--The cancer has spread to nearby lymph nodes. The tumor may be up to 2 in (5 cm) wide.  Stage III--The cancer has spread to more distant lymph nodes. The tumor may be larger than 2 in (5 cm) wide.  Stage IV--The cancer has spread to other parts of the body, such as the bones, brain, liver, or lungs. How is this treated? Treatment for this condition depends on  the type and stage of the breast cancer. It may be treated with:  Surgery. This may involve breast-conserving surgery (lumpectomy or partial mastectomy) in which only the part of the breast containing the cancer is removed. Some normal tissue surrounding this area may also be removed. In some cases, surgery may be done to remove the entire breast (mastectomy) and nipple. Lymph nodes may also be removed.  Radiation therapy, which uses high-energy rays to kill cancer cells.  Chemotherapy, which is the use of drugs to kill cancer cells.  Hormone therapy, which involves taking medicine to adjust the hormone levels in your body. You may take medicine to decrease your estrogen levels. This can help stop cancer cells from growing.  Targeted therapy, in which drugs are used to block the growth and spread of cancer cells. These drugs target a specific part of the cancer cell and usually cause fewer side effects than chemotherapy. Targeted therapy may be used alone or in combination with chemotherapy.  A combination of surgery, radiation, chemotherapy, or hormone therapy may be needed to treat breast cancer. Follow these instructions at home:  Take over-the-counter and prescription medicines only as told by your health care provider.  Eat a healthy diet. A healthy diet includes lots of fruits and vegetables, low-fat dairy products, lean meats, and fiber. ? Make sure half your plate is filled with fruits or vegetables. ? Choose high-fiber foods such as whole-grain breads and cereals.  Consider joining a support group. This may help you learn to cope with the stress of having breast cancer.  Talk to your health care team about exercise and physical activity. The right exercise program can: ? Help prevent or reduce symptoms such as fatigue or depression. ? Improve overall health and survival rates.  Keep all follow-up visits as told by your health care provider. This is important. Where to find more  information  American Cancer Society: www.cancer.[place]: www.cancer.gov Contact a health care provider if:  You have a sudden increase in pain.  You have any symptoms or changes that concern you.  You lose weight without trying.  You notice a new lump in either breast or under your arm.  You develop swelling in either arm or hand.  You have a fever.  You notice new fatigue or weakness. Get help right away if:  You have chest pain or trouble breathing.  You faint. Summary  Breast cancer is a malignant growth of tissue (tumor) in the breast.  Your cancer will be staged to determine its severity and extent.  Treatment for this condition depends on the type and stage of the breast cancer. This information is not intended to replace advice given to you by your health care provider. Make sure you discuss any questions you have with your health care provider. Document Revised: 02/13/2017 Document Reviewed: 10/27/2016 Elsevier Patient Education  [place].

## 2019-08-05 ENCOUNTER — Telehealth: Payer: Self-pay | Admitting: Surgery

## 2019-08-05 ENCOUNTER — Other Ambulatory Visit: Payer: Self-pay | Admitting: Surgery

## 2019-08-05 DIAGNOSIS — Z853 Personal history of malignant neoplasm of breast: Secondary | ICD-10-CM

## 2019-08-05 NOTE — Telephone Encounter (Signed)
Pt has been advised of Pre-Admission date/time, COVID Testing date and Surgery date.  Surgery Date: 08/12/19 Preadmission Testing Date: 08/09/19 (phone 1p-5p) Covid Testing Date: 08/10/19 - patient advised to go to the [facility] ([place]) between 8a-1p  Patient has been made aware to call [phone], between 1-3:00pm the day before surgery, to find out what time to arrive for surgery.    Patient has also been made aware that I am trying desperately to get in touch with someone from [facility] to schedule RFID to be placed prior to her surgery.

## 2019-08-08 LAB — SURGICAL PATHOLOGY

## 2019-08-09 ENCOUNTER — Other Ambulatory Visit: Payer: Self-pay | Admitting: Surgery

## 2019-08-09 ENCOUNTER — Encounter
Admission: RE | Admit: 2019-08-09 | Discharge: 2019-08-09 | Disposition: A | Discharge status: Home / Self Care | Payer: Medicare Other | Source: Ambulatory Visit | Attending: Surgery | Admitting: Surgery

## 2019-08-09 ENCOUNTER — Encounter: Payer: Self-pay | Admitting: Oncology

## 2019-08-09 ENCOUNTER — Other Ambulatory Visit: Payer: Self-pay

## 2019-08-09 ENCOUNTER — Inpatient Hospital Stay: Payer: Medicare Other | Attending: Oncology | Admitting: Oncology

## 2019-08-09 ENCOUNTER — Inpatient Hospital Stay: Payer: Medicare Other

## 2019-08-09 ENCOUNTER — Encounter: Payer: Self-pay | Admitting: *Deleted

## 2019-08-09 ENCOUNTER — Telehealth: Payer: Self-pay | Admitting: *Deleted

## 2019-08-09 VITALS — BP 131/88 | HR 82 | Temp 98.3°F | Resp 16 | Ht 63.0 in | Wt 181.0 lb

## 2019-08-09 DIAGNOSIS — C50312 Malignant neoplasm of 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: Secondary | ICD-10-CM

## 2019-08-09 DIAGNOSIS — I10 Essential (primary) hypertension: Secondary | ICD-10-CM | POA: Insufficient documentation

## 2019-08-09 DIAGNOSIS — Z01812 Encounter for preprocedural laboratory examination: Secondary | ICD-10-CM | POA: Insufficient documentation

## 2019-08-09 DIAGNOSIS — C50912 Malignant neoplasm of unspecified site of left female breast: Secondary | ICD-10-CM

## 2019-08-09 DIAGNOSIS — Z7189 Other specified counseling: Secondary | ICD-10-CM

## 2019-08-09 DIAGNOSIS — Z170 Estrogen receptor positive status [ER+]: Secondary | ICD-10-CM | POA: Insufficient documentation

## 2019-08-09 HISTORY — DX: Anxiety disorder, unspecified: F41.9

## 2019-08-09 NOTE — Patient Instructions (Signed)
INSTRUCTIONS FOR SURGERY     Your surgery is scheduled for:   Friday, MAY 28TH     When you arrive for surgery, report to the [facility].       Someone will escort you to Mammography. Please be there by 7:15 am or whenever        THEY tell you.    REMEMBER: Instructions that are not followed completely may result in serious medical risk,  up to and including death, or upon the discretion of your surgeon and anesthesiologist,            your surgery may need to be rescheduled.  __X__ 1. Do not eat food after midnight the night before your procedure.                    No gum, candy, lozenger, tic tacs, tums or hard candies.                  ABSOLUTELY NOTHING SOLID IN YOUR MOUTH AFTER MIDNIGHT                    You may drink unlimited clear liquids up to 2 hours before you are scheduled to arrive for surgery.                   Do not drink anything within those 2 hours unless you need to take medicine, then take the                   smallest amount you need.  Clear liquids include:  water, apple juice without pulp,                   any flavor Gatorade, Black coffee, black tea.  Sugar may be added but no dairy/ honey /lemon.                        Broth and jello is not considered a clear liquid.  __x__  2. On the morning of surgery, please brush your teeth with toothpaste and water. You may rinse with                  mouthwash if you wish but DO NOT SWALLOW TOOTHPASTE OR MOUTHWASH  __X___3. NO alcohol for 24 hours before or after surgery.  __x___ 4.  Do NOT smoke or use e-cigarettes for 24 HOURS PRIOR TO SURGERY.                      DO NOT USE any chewable tobacco products for at least 6 hours prior to surgery.  __x___ 5. If you start any new medication after this appointment and prior to surgery, please                   Bring it with you on the day of surgery.  ___x__ 6. Notify your doctor if there is any change in your  medical condition, such as fever,                   infection, vomitting, diarrhea or  any open sores.  __x___ 7.  USE the CHG SOAP as instructed, the night before surgery and the day of surgery.                   Once you have washed with this soap, do NOT use any of the following: Powders, perfumes                    or lotions. Please do not wear make up, hairpins, clips or nail polish. You MAY NOT                    wear deodorant. Men may shave their face and neck.  Women need to shave 48 hours                   prior to surgery. DO NOT wear ANY jewelry on the day of surgery. If there are rings that are                    too tight to remove easily, please address this prior to the surgery day.                                                                    NO METAL ON YOUR BODY.                    Do NOT bring any valuables.  If you came to Pre-Admit testing then you will not need license,                     insurance card or credit card.  If you will be staying overnight, please either leave your things in                     the car or have your family be responsible for these items.                      IS NOT RESPONSIBLE FOR BELONGINGS OR VALUABLES.  ___X__ 8. DO NOT wear contact lenses on surgery day.  You may not have dentures,                     Hearing aides, contacts or glasses in the operating room. These items can be                    Placed in the Recovery Room to receive immediately after surgery.  __x___ 9. IF YOU ARE SCHEDULED TO GO HOME ON THE SAME DAY, YOU MUST                   Have someone to drive you home and to stay with you  for the first 24 hours.                    Have an arrangement prior to arriving on surgery day.  ___x__ 10. Take the following medications on the morning of surgery with a sip of water:  1. SYNTHROID                     2. PRILOSEC                     3. CHLORTHALIDONE                     4.                 __X__  12. STOP ALL ASPIRIN PRODUCTS AS OF Tuesday, MAY 25TH                        THIS INCLUDES BC POWDERS / GOODIES POWDER  __x___ 13. STOP Anti-inflammatories as of: Tuesday, MAY 25TH                     RELAFEN IS INCLUDED IN THE NSAIDS.                      This includes IBUPROFEN / MOTRIN / ADVIL / ALEVE/ NAPROXYN                    YOU MAY TAKE TYLENOL ANY TIME PRIOR TO SURGERY.  __X___ 49.  Stop supplements until after surgery.                     This includes: CALCIUM + D // MULTIVITAMINS // VITAMIN E // PROBIOTIC                           GLUCOSAMINE [place] may continue taking Vitamin D3 but do not take on the morning of surgery.  __X____17.  Continue to take the following medications but do not take on the morning of surgery:                          AVAPRO // HYDROCHLOROTHIAZIDE // VITAMIN D  __X____18. If staying overnight, please have appropriate shoes to wear to be able to walk around the unit.                   Wear clean and comfortable clothing to the hospital.  PLEASE BRING A COPY OF YOUR MEDICAL DIRECTIVES SO WE MAY MAKE A COPY    FOR YOUR CHART. BRING A SPORTS BRA OR A SNUG BRA THAT YOU CAN WEAR AROUND THE CLOCK FOR     THE FIRST WEEK AFTER SURGERY. HAVE A SMALL PILLOW AT HOME TO PLACE UNDER YOUR LEFT ARM TO KEEP YOUR ARM     OFF OF YOUR LEFT BREAST.

## 2019-08-09 NOTE — Progress Notes (Signed)
Pt is new breast cancer pt. Had bx . Pt scheduled for surgery 5/28. Here to discuss pathology

## 2019-08-09 NOTE — Progress Notes (Signed)
Met patient today during her initial medical oncology consult with Dr. Janese Banks.  She is scheduled for lumpectomy on Friday.  Gave patient breast cancer educational literature, "My Breast Cancer Treatment Handbook" by Josephine Igo, RN.   Referral sent to genetics due to family history of breast and pancreatic cancer.  She is to follow up in 2 weeks with Dr. Janese Banks.  She is to call with any questions or needs.

## 2019-08-09 NOTE — Telephone Encounter (Signed)
Called Dr. Candiss Norse office and the doctor left so Dr. Edwina Barth taking over her care. Pt. Had bone density 11/27/2018. At that time she had osteopenia on the report. Pt has now dx of breast cancer and will start on AI in next 2 months and we are asking for th doctor to do a bone density in 2 months as a baseline before starting her on med. Asked to have staff call me back and see if that is ok.

## 2019-08-10 ENCOUNTER — Ambulatory Visit: Payer: Medicare Other | Admitting: Hematology and Oncology

## 2019-08-10 ENCOUNTER — Ambulatory Visit
Admission: RE | Admit: 2019-08-10 | Discharge: 2019-08-10 | Disposition: A | Discharge status: Home / Self Care | Payer: Medicare Other | Source: Ambulatory Visit | Attending: Surgery | Admitting: Surgery

## 2019-08-10 ENCOUNTER — Other Ambulatory Visit
Admission: RE | Admit: 2019-08-10 | Discharge: 2019-08-10 | Disposition: A | Discharge status: Home / Self Care | Payer: Medicare Other | Source: Ambulatory Visit | Attending: Surgery | Admitting: Surgery

## 2019-08-10 DIAGNOSIS — Z853 Personal history of malignant neoplasm of breast: Secondary | ICD-10-CM | POA: Diagnosis not present

## 2019-08-10 DIAGNOSIS — Z20822 Contact with and (suspected) exposure to covid-19: Secondary | ICD-10-CM | POA: Insufficient documentation

## 2019-08-10 DIAGNOSIS — I10 Essential (primary) hypertension: Secondary | ICD-10-CM | POA: Insufficient documentation

## 2019-08-10 DIAGNOSIS — Z01818 Encounter for other preprocedural examination: Secondary | ICD-10-CM | POA: Insufficient documentation

## 2019-08-11 LAB — SARS CORONAVIRUS 2 (TAT 6-24 HRS): SARS Coronavirus 2: NEGATIVE

## 2019-08-11 MED ORDER — CIPROFLOXACIN IN D5W 400 MG/200ML IV SOLN
400.0000 mg | INTRAVENOUS | Status: AC
  Administered 2019-08-12: 400 mg via INTRAVENOUS

## 2019-08-12 ENCOUNTER — Ambulatory Visit: Payer: Medicare Other | Admitting: Anesthesiology

## 2019-08-12 ENCOUNTER — Ambulatory Visit
Admission: RE | Admit: 2019-08-12 | Discharge: 2019-08-12 | Disposition: A | Discharge status: Home / Self Care | Payer: Medicare Other | Attending: Surgery | Admitting: Surgery

## 2019-08-12 ENCOUNTER — Ambulatory Visit
Admission: RE | Admit: 2019-08-12 | Discharge: 2019-08-12 | Disposition: A | Discharge status: Home / Self Care | Payer: Medicare Other | Source: Ambulatory Visit | Attending: Surgery | Admitting: Surgery

## 2019-08-12 ENCOUNTER — Ambulatory Visit (HOSPITAL_BASED_OUTPATIENT_CLINIC_OR_DEPARTMENT_OTHER)
Admission: RE | Admit: 2019-08-12 | Discharge: 2019-08-12 | Disposition: A | Discharge status: Home / Self Care | Payer: Medicare Other | Source: Ambulatory Visit | Attending: Surgery | Admitting: Surgery

## 2019-08-12 ENCOUNTER — Other Ambulatory Visit: Payer: Self-pay

## 2019-08-12 ENCOUNTER — Encounter
Admission: RE | Disposition: A | Discharge status: Home / Self Care | Payer: Self-pay | Source: Home / Self Care | Attending: Surgery

## 2019-08-12 DIAGNOSIS — M810 Age-related osteoporosis without current pathological fracture: Secondary | ICD-10-CM | POA: Insufficient documentation

## 2019-08-12 DIAGNOSIS — I129 Hypertensive chronic kidney disease with stage 1 through stage 4 chronic kidney disease, or unspecified chronic kidney disease: Secondary | ICD-10-CM | POA: Insufficient documentation

## 2019-08-12 DIAGNOSIS — Z8041 Family history of malignant neoplasm of ovary: Secondary | ICD-10-CM | POA: Diagnosis not present

## 2019-08-12 DIAGNOSIS — M4800 Spinal stenosis, site unspecified: Secondary | ICD-10-CM | POA: Diagnosis not present

## 2019-08-12 DIAGNOSIS — G2581 Restless legs syndrome: Secondary | ICD-10-CM | POA: Insufficient documentation

## 2019-08-12 DIAGNOSIS — M5136 Other intervertebral disc degeneration, lumbar region: Secondary | ICD-10-CM | POA: Insufficient documentation

## 2019-08-12 DIAGNOSIS — Z79899 Other long term (current) drug therapy: Secondary | ICD-10-CM | POA: Diagnosis not present

## 2019-08-12 DIAGNOSIS — Z791 Long term (current) use of non-steroidal anti-inflammatories (NSAID): Secondary | ICD-10-CM | POA: Diagnosis not present

## 2019-08-12 DIAGNOSIS — C50912 Malignant neoplasm of unspecified site of left female breast: Secondary | ICD-10-CM | POA: Diagnosis present

## 2019-08-12 DIAGNOSIS — Z85850 Personal history of malignant neoplasm of thyroid: Secondary | ICD-10-CM | POA: Insufficient documentation

## 2019-08-12 DIAGNOSIS — Z881 Allergy status to other antibiotic agents status: Secondary | ICD-10-CM | POA: Diagnosis not present

## 2019-08-12 DIAGNOSIS — M1990 Unspecified osteoarthritis, unspecified site: Secondary | ICD-10-CM | POA: Diagnosis not present

## 2019-08-12 DIAGNOSIS — Z803 Family history of malignant neoplasm of breast: Secondary | ICD-10-CM | POA: Insufficient documentation

## 2019-08-12 DIAGNOSIS — Z7189 Other specified counseling: Secondary | ICD-10-CM | POA: Insufficient documentation

## 2019-08-12 DIAGNOSIS — Z170 Estrogen receptor positive status [ER+]: Secondary | ICD-10-CM | POA: Diagnosis not present

## 2019-08-12 DIAGNOSIS — Z886 Allergy status to analgesic agent status: Secondary | ICD-10-CM | POA: Insufficient documentation

## 2019-08-12 DIAGNOSIS — E785 Hyperlipidemia, unspecified: Secondary | ICD-10-CM | POA: Insufficient documentation

## 2019-08-12 DIAGNOSIS — Z880 Allergy status to penicillin: Secondary | ICD-10-CM | POA: Diagnosis not present

## 2019-08-12 DIAGNOSIS — J45909 Unspecified asthma, uncomplicated: Secondary | ICD-10-CM | POA: Diagnosis not present

## 2019-08-12 DIAGNOSIS — K219 Gastro-esophageal reflux disease without esophagitis: Secondary | ICD-10-CM | POA: Diagnosis not present

## 2019-08-12 DIAGNOSIS — Z888 Allergy status to other drugs, medicaments and biological substances status: Secondary | ICD-10-CM | POA: Insufficient documentation

## 2019-08-12 DIAGNOSIS — N1832 Chronic kidney disease, stage 3b: Secondary | ICD-10-CM | POA: Insufficient documentation

## 2019-08-12 DIAGNOSIS — Z79890 Hormone replacement therapy (postmenopausal): Secondary | ICD-10-CM | POA: Insufficient documentation

## 2019-08-12 DIAGNOSIS — Z853 Personal history of malignant neoplasm of breast: Secondary | ICD-10-CM

## 2019-08-12 DIAGNOSIS — E890 Postprocedural hypothyroidism: Secondary | ICD-10-CM | POA: Insufficient documentation

## 2019-08-12 DIAGNOSIS — C50312 Malignant neoplasm of 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: Secondary | ICD-10-CM | POA: Diagnosis not present

## 2019-08-12 DIAGNOSIS — K589 Irritable bowel syndrome without diarrhea: Secondary | ICD-10-CM | POA: Insufficient documentation

## 2019-08-12 DIAGNOSIS — Z923 Personal history of irradiation: Secondary | ICD-10-CM | POA: Diagnosis not present

## 2019-08-12 HISTORY — PX: BREAST LUMPECTOMY WITH SENTINEL LYMPH NODE BIOPSY: SHX5597

## 2019-08-12 HISTORY — PX: BREAST LUMPECTOMY WITH RADIOFREQUENCY TAG IDENTIFICATION: SHX6884

## 2019-08-12 SURGERY — BREAST LUMPECTOMY WITH RADIOFREQUENCY TAG IDENTIFICATION
Anesthesia: General | Laterality: Left

## 2019-08-12 MED ORDER — ONDANSETRON HCL 4 MG/2ML IJ SOLN: 4.0000 mg | Freq: Once | INTRAMUSCULAR | Status: DC | PRN

## 2019-08-12 MED ORDER — ORAL CARE MOUTH RINSE: 15.0000 mL | Freq: Once | OROMUCOSAL | Status: AC

## 2019-08-12 MED ORDER — HYDROCODONE-ACETAMINOPHEN 5-325 MG PO TABS: 1.0000 | ORAL_TABLET | Freq: Once | ORAL | Status: AC

## 2019-08-12 MED ORDER — PROPOFOL 10 MG/ML IV BOLUS
INTRAVENOUS | Status: AC
  Filled 2019-08-12: qty 20

## 2019-08-12 MED ORDER — PHENYLEPHRINE HCL (PRESSORS) 10 MG/ML IV SOLN
INTRAVENOUS | Status: DC | PRN
  Administered 2019-08-12: 100 ug via INTRAVENOUS

## 2019-08-12 MED ORDER — ACETAMINOPHEN 500 MG PO TABS
1000.0000 mg | ORAL_TABLET | ORAL | Status: AC
  Administered 2019-08-12: 1000 mg via ORAL

## 2019-08-12 MED ORDER — BUPIVACAINE LIPOSOME 1.3 % IJ SUSP: 20.0000 mL | Freq: Once | INTRAMUSCULAR | Status: DC

## 2019-08-12 MED ORDER — PROPOFOL 10 MG/ML IV BOLUS
INTRAVENOUS | Status: DC | PRN
  Administered 2019-08-12: 20 mg via INTRAVENOUS
  Administered 2019-08-12 (×2): 30 mg via INTRAVENOUS
  Administered 2019-08-12: 150 mg via INTRAVENOUS

## 2019-08-12 MED ORDER — LACTATED RINGERS IV SOLN: INTRAVENOUS | Status: DC

## 2019-08-12 MED ORDER — BUPIVACAINE-EPINEPHRINE (PF) 0.25% -1:200000 IJ SOLN
INTRAMUSCULAR | Status: AC
  Filled 2019-08-12: qty 20

## 2019-08-12 MED ORDER — BUPIVACAINE-EPINEPHRINE (PF) 0.25% -1:200000 IJ SOLN
INTRAMUSCULAR | Status: DC | PRN
  Administered 2019-08-12: 20 mL via PERINEURAL

## 2019-08-12 MED ORDER — DEXAMETHASONE SODIUM PHOSPHATE 10 MG/ML IJ SOLN
INTRAMUSCULAR | Status: DC | PRN
  Administered 2019-08-12: 8 mg via INTRAVENOUS

## 2019-08-12 MED ORDER — HYDROCODONE-ACETAMINOPHEN 5-325 MG PO TABS
ORAL_TABLET | ORAL | Status: AC
  Administered 2019-08-12: 1 via ORAL
  Filled 2019-08-12: qty 1

## 2019-08-12 MED ORDER — GABAPENTIN 300 MG PO CAPS
ORAL_CAPSULE | ORAL | Status: AC
  Administered 2019-08-12: 300 mg via ORAL
  Filled 2019-08-12: qty 1

## 2019-08-12 MED ORDER — GABAPENTIN 300 MG PO CAPS: 300.0000 mg | ORAL_CAPSULE | ORAL | Status: AC

## 2019-08-12 MED ORDER — LIDOCAINE HCL (CARDIAC) PF 100 MG/5ML IV SOSY
PREFILLED_SYRINGE | INTRAVENOUS | Status: DC | PRN
  Administered 2019-08-12: 80 mg via INTRAVENOUS

## 2019-08-12 MED ORDER — FENTANYL CITRATE (PF) 100 MCG/2ML IJ SOLN
25.0000 ug | INTRAMUSCULAR | Status: DC | PRN
  Administered 2019-08-12: 25 ug via INTRAVENOUS

## 2019-08-12 MED ORDER — ISOSULFAN BLUE 1 % ~~LOC~~ SOLN
SUBCUTANEOUS | Status: AC
  Filled 2019-08-12: qty 5

## 2019-08-12 MED ORDER — CHLORHEXIDINE GLUCONATE 0.12 % MT SOLN: 15.0000 mL | Freq: Once | OROMUCOSAL | Status: AC

## 2019-08-12 MED ORDER — LIDOCAINE HCL (PF) 2 % IJ SOLN
INTRAMUSCULAR | Status: AC
  Filled 2019-08-12: qty 5

## 2019-08-12 MED ORDER — BUPIVACAINE LIPOSOME 1.3 % IJ SUSP
INTRAMUSCULAR | Status: AC
  Filled 2019-08-12: qty 20

## 2019-08-12 MED ORDER — CHLORHEXIDINE GLUCONATE CLOTH 2 % EX PADS: 6.0000 | MEDICATED_PAD | Freq: Once | CUTANEOUS | Status: DC

## 2019-08-12 MED ORDER — CHLORHEXIDINE GLUCONATE 0.12 % MT SOLN
OROMUCOSAL | Status: AC
  Administered 2019-08-12: 15 mL via OROMUCOSAL
  Filled 2019-08-12: qty 15

## 2019-08-12 MED ORDER — ACETAMINOPHEN 500 MG PO TABS
ORAL_TABLET | ORAL | Status: AC
  Filled 2019-08-12: qty 2

## 2019-08-12 MED ORDER — MIDAZOLAM HCL 2 MG/2ML IJ SOLN
INTRAMUSCULAR | Status: AC
  Filled 2019-08-12: qty 2

## 2019-08-12 MED ORDER — FENTANYL CITRATE (PF) 100 MCG/2ML IJ SOLN
INTRAMUSCULAR | Status: AC
  Filled 2019-08-12: qty 2

## 2019-08-12 MED ORDER — FENTANYL CITRATE (PF) 100 MCG/2ML IJ SOLN
INTRAMUSCULAR | Status: AC
  Administered 2019-08-12: 25 ug via INTRAVENOUS
  Filled 2019-08-12: qty 2

## 2019-08-12 MED ORDER — CIPROFLOXACIN IN D5W 400 MG/200ML IV SOLN
INTRAVENOUS | Status: AC
  Filled 2019-08-12: qty 200

## 2019-08-12 MED ORDER — SODIUM CHLORIDE FLUSH 0.9 % IV SOLN
INTRAVENOUS | Status: AC
  Filled 2019-08-12: qty 10

## 2019-08-12 MED ORDER — TECHNETIUM TC 99M SULFUR COLLOID FILTERED
0.8540 | Freq: Once | INTRAVENOUS | Status: AC | PRN
  Administered 2019-08-12: 0.854 via INTRADERMAL

## 2019-08-12 MED ORDER — EPHEDRINE SULFATE 50 MG/ML IJ SOLN
INTRAMUSCULAR | Status: DC | PRN
  Administered 2019-08-12: 10 mg via INTRAVENOUS

## 2019-08-12 MED ORDER — ONDANSETRON HCL 4 MG/2ML IJ SOLN
INTRAMUSCULAR | Status: DC | PRN
  Administered 2019-08-12: 4 mg via INTRAVENOUS

## 2019-08-12 MED ORDER — ISOSULFAN BLUE 1 % ~~LOC~~ SOLN
SUBCUTANEOUS | Status: DC | PRN
  Administered 2019-08-12: 4 mL via SUBCUTANEOUS

## 2019-08-12 MED ORDER — HYDROCODONE-ACETAMINOPHEN 5-325 MG PO TABS: 1.0000 | ORAL_TABLET | Freq: Four times a day (QID) | ORAL | 0 refills | Status: DC | PRN

## 2019-08-12 MED ORDER — FENTANYL CITRATE (PF) 100 MCG/2ML IJ SOLN
INTRAMUSCULAR | Status: DC | PRN
  Administered 2019-08-12 (×5): 25 ug via INTRAVENOUS

## 2019-08-12 SURGICAL SUPPLY — 39 items
APPLIER CLIP 9.375 SM OPEN (CLIP)
BLADE SURG 15 STRL LF DISP TIS (BLADE) ×1 IMPLANT
BLADE SURG 15 STRL SS (BLADE) ×2
CANISTER SUCT 1200ML W/VALVE (MISCELLANEOUS) ×3 IMPLANT
CHLORAPREP W/TINT 26 (MISCELLANEOUS) ×3 IMPLANT
CLIP APPLIE 9.375 SM OPEN (CLIP) IMPLANT
CNTNR SPEC 2.5X3XGRAD LEK (MISCELLANEOUS)
CONT SPEC 4OZ STER OR WHT (MISCELLANEOUS)
CONTAINER SPEC 2.5X3XGRAD LEK (MISCELLANEOUS) IMPLANT
COVER WAND RF STERILE (DRAPES) ×1 IMPLANT
DECANTER SPIKE VIAL GLASS SM (MISCELLANEOUS) ×1 IMPLANT
DERMABOND ADVANCED (GAUZE/BANDAGES/DRESSINGS)
DERMABOND ADVANCED .7 DNX12 (GAUZE/BANDAGES/DRESSINGS) ×1 IMPLANT
DEVICE DUBIN SPECIMEN MAMMOGRA (MISCELLANEOUS) ×3 IMPLANT
DRAPE LAPAROTOMY TRNSV 106X77 (MISCELLANEOUS) ×3 IMPLANT
ELECT CAUTERY BLADE TIP 2.5 (TIP) ×3
ELECT REM PT RETURN 9FT ADLT (ELECTROSURGICAL) ×3
ELECTRODE CAUTERY BLDE TIP 2.5 (TIP) ×1 IMPLANT
ELECTRODE REM PT RTRN 9FT ADLT (ELECTROSURGICAL) ×1 IMPLANT
GLOVE BIOGEL PI IND STRL 7.0 (GLOVE) IMPLANT
GLOVE BIOGEL PI INDICATOR 7.0 (GLOVE) ×2
GLOVE ORTHO TXT STRL SZ7.5 (GLOVE) ×3 IMPLANT
GOWN STRL REUS W/ TWL LRG LVL3 (GOWN DISPOSABLE) ×2 IMPLANT
GOWN STRL REUS W/TWL LRG LVL3 (GOWN DISPOSABLE) ×4
KIT MARKER MARGIN INK (KITS) IMPLANT
KIT TURNOVER KIT A (KITS) ×3 IMPLANT
NEEDLE HYPO 22GX1.5 SAFETY (NEEDLE) ×3 IMPLANT
PACK BASIN MINOR (MISCELLANEOUS) ×3 IMPLANT
SET LOCALIZER 20 PROBE US (MISCELLANEOUS) ×3 IMPLANT
SET WALTER ACTIVATION W/DRAPE (SET/KITS/TRAYS/PACK) IMPLANT
SLEVE PROBE SENORX GAMMA FIND (MISCELLANEOUS) ×3 IMPLANT
SUT MNCRL 4-0 (SUTURE) ×2
SUT MNCRL 4-0 27XMFL (SUTURE) ×1
SUT VIC AB 3-0 SH 27 (SUTURE) ×2
SUT VIC AB 3-0 SH 27X BRD (SUTURE) ×1 IMPLANT
SUTURE MNCRL 4-0 27XMF (SUTURE) ×1 IMPLANT
SYR 10ML LL (SYRINGE) ×3 IMPLANT
SYR 20ML LL LF (SYRINGE) ×3 IMPLANT
WATER STERILE IRR 1000ML POUR (IV SOLUTION) ×3 IMPLANT

## 2019-08-12 NOTE — Transfer of Care (Signed)
Immediate Anesthesia Transfer of Care Note  Patient: HENA TALLO  Procedure(s) Performed: BREAST LUMPECTOMY WITH RADIOFREQUENCY TAG IDENTIFICATION (Left ) BREAST LUMPECTOMY WITH SENTINEL LYMPH NODE BX (Left )  Patient Location: PACU  Anesthesia Type:General  Level of Consciousness: sedated  Airway & Oxygen Therapy: Patient Spontanous Breathing and Patient connected to face mask oxygen  Post-op Assessment: Report given to RN and Post -op Vital signs reviewed and stable  Post vital signs: Reviewed and stable  Last Vitals:  Vitals Value Taken Time  BP 103/68 08/12/19 1125  Temp 36.1 C 08/12/19 1125  Pulse 71 08/12/19 1129  Resp 11 08/12/19 1129  SpO2 99 % 08/12/19 1129  Vitals shown include unvalidated device data.  Last Pain:  Vitals:   08/12/19 1125  TempSrc:   PainSc: Asleep         Complications: No apparent anesthesia complications

## 2019-08-12 NOTE — Anesthesia Preprocedure Evaluation (Signed)
Anesthesia Evaluation  Patient identified by MRN, date of birth, ID band Patient awake    Reviewed: Allergy & Precautions, NPO status , Patient's Chart, lab work & pertinent test results  History of Anesthesia Complications Negative for: history of anesthetic complications  Airway Mallampati: II  TM Distance: >3 FB Neck ROM: Full    Dental no notable dental hx.    Pulmonary asthma , neg sleep apnea,    breath sounds clear to auscultation- rhonchi (-) wheezing      Cardiovascular hypertension, Pt. on medications (-) CAD, (-) Past MI, (-) Cardiac Stents and (-) CABG  Rhythm:Regular Rate:Normal - Systolic murmurs and - Diastolic murmurs    Neuro/Psych  Headaches, neg Seizures Anxiety    GI/Hepatic Neg liver ROS, GERD  ,  Endo/Other  neg diabetesHypothyroidism   Renal/GU CRFRenal disease     Musculoskeletal  (+) Arthritis ,   Abdominal (+) + obese,   Peds  Hematology  (+) anemia ,   Anesthesia Other Findings Past Medical History: No date: Allergic rhinitis No date: Anemia     Comment:  low iron No date: Anxiety 07/2019: Arthritis     Comment:  osteoporosis No date: Asthma without status asthmaticus     Comment:  seasonal, not often No date: Bilateral sciatica 07/2019: Breast cancer ([facility])     Comment:  invasive mammary cancer , grade 2 No date: Bronchitis No date: Cancer ([facility])     Comment:   follicular variant papillary thyroid cancer No date: Chronic back pain     Comment:  unspecified No date: Chronic kidney disease     Comment:  ckd stage 3b No date: Colon polyp     Comment:  adenomatous No date: DDD (degenerative disc disease), lumbar No date: DJD (degenerative joint disease) No date: Dysphagia No date: Dyspnea No date: Environmental allergies No date: GERD (gastroesophageal reflux disease) No date: Headache     Comment:  migraines as a young woman No date: History of recurrent UTIs No date:  Hyperlipidemia No date: Hypertension No date: Hypothyroidism No date: IBS (irritable bowel syndrome) No date: Lumbar disc disease 2002: Mold exposure     Comment:  hx of  No date: Osteoporosis, post-menopausal 1978: Pneumonia No date: Restless legs No date: Scoliosis No date: Spinal stenosis No date: Thyroid nodule     Comment:  aspirated in 1997 and 2003 which were negative No date: Ulcer No date: Varicose veins of left lower extremity No date: Vertigo No date: Wears dentures   Reproductive/Obstetrics                             Anesthesia Physical Anesthesia Plan  ASA: III  Anesthesia Plan: General   Post-op Pain Management:    Induction: Intravenous  PONV Risk Score and Plan: 2 and Ondansetron, Dexamethasone and Treatment may vary due to age or medical condition  Airway Management Planned: LMA  Additional Equipment:   Intra-op Plan:   Post-operative Plan:   Informed Consent: I have reviewed the patients History and Physical, chart, labs and discussed the procedure including the risks, benefits and alternatives for the proposed anesthesia with the patient or authorized representative who has indicated his/her understanding and acceptance.     Dental advisory given  Plan Discussed with: CRNA and Anesthesiologist  Anesthesia Plan Comments:         Anesthesia Quick Evaluation

## 2019-08-12 NOTE — Discharge Instructions (Signed)
AMBULATORY SURGERY  °DISCHARGE INSTRUCTIONS ° ° °1) The drugs that you were given will stay in your system until tomorrow so for the next 24 hours you should not: ° °A) Drive an automobile °B) Make any legal decisions °C) Drink any alcoholic beverage ° ° °2) You may resume regular meals tomorrow.  Today it is better to start with liquids and gradually work up to solid foods. ° °You may eat anything you prefer, but it is better to start with liquids, then soup and crackers, and gradually work up to solid foods. ° ° °3) Please notify your doctor immediately if you have any unusual bleeding, trouble breathing, redness and pain at the surgery site, drainage, fever, or pain not relieved by medication. ° ° ° °4) Additional Instructions: ° ° ° ° ° ° ° °Please contact your physician with any problems or Same Day Surgery at [phone], Monday through Friday 6 am to 4 pm, or [facility] [place] Main number at [phone]. °

## 2019-08-12 NOTE — Interval H&P Note (Signed)
History and Physical Interval Note:  08/12/2019 9:44 AM  Latoya Stevenson  has presented today for surgery, with the diagnosis of Left breast cancer.  The various methods of treatment have been discussed with the patient and family. After consideration of risks, benefits and other options for treatment, the patient has consented to  Procedure(s): BREAST LUMPECTOMY WITH RADIOFREQUENCY TAG IDENTIFICATION (Left) and sentinel lymph node biopsy as a surgical intervention.  [name] history has been reviewed, patient examined, no change in status, stable for surgery.  I have reviewed the patient's chart and labs.  Questions were answered to the patient's satisfaction.   The left side is marked correct.   Ronny Bacon, M.D., [facility]  08/12/2019 ; 9:44 AM

## 2019-08-12 NOTE — Op Note (Signed)
  Pre-operative Diagnosis: Breast Cancer, left    Post-operative Diagnosis: Same   Surgeon: Ronny Bacon, M.D., FACS  Anesthesia: General LMA  Procedure: Left breast lumpectomy, RFID tag directed, sentinel node biopsy  Procedure Details  The patient was seen again in the Holding Room. The benefits, complications, treatment options, and expected outcomes were discussed with the patient. The risks of bleeding, infection, recurrence of symptoms, failure to resolve symptoms, hematoma, seroma, open wound, cosmetic deformity, and the need for further surgery were discussed.  The patient was taken to Operating Room, identified as Bayard Hugger and the procedure verified.  A Time Out was held and the above information confirmed.  Prior to the induction of general anesthesia, antibiotic prophylaxis was administered. VTE prophylaxis was in place. The LOCALizer is used to mark the skin for incision.  A visual dye Isosulfan Blue 4 ml was injected periareolar subdermally under aseptic conditions. Appropriate anesthesia was then administered and tolerated well. The chest was prepped with Chloraprep and draped in the sterile fashion. The patient was positioned in the supine position.  Then using the hand-held probe an area of high counts was identified in the axilla, an incision was made and direction by the probe aided in dissection of a blue lymph node with counts as high as 700 which was sent for permanent section.  Background counts were less than 70.  Therefore there was only 1 sentinel lymph node for biopsy.  Attention was turned to the RFID tag localization site where an incision was made. Dissection using the LOCALizer to perform a lumpectomy with adequate margins was performed. This was done with electrocautery and sharp dissection with Mayo scissors.  The localizer was freed and identified within the biopsy cavity.  It was presumed that the inferior margin was the site where the localizer was  closest, so an additional shave margin was taken of the inferior margin.  There was minimal bleeding, and the cavity packed.  The specimen was taken to the back table and painted to demarcate the 6 surfaces of potential margin.  There were 2 specimens 1 was identified with 5 surfaces and the additional shave margin was only identified on the inferior perimeter, thus completing all 6 surfaces.  I returned to the cavity to remove the packing, and hemostasis was confirmed with electrocautery.   Once assuring that hemostasis was adequate and checked multiple times the wound was closed with interrupted 3-0 Vicryl followed by 4-0 subcuticular Monocryl sutures.  The axillary wound was closed in a similar fashion. Dermabond is utilized to seal the incision.  Local anesthesia of quarter percent Marcaine with epinephrine is infiltrated into the cavity space.  Patient was taken to the recovery room in stable condition where a postoperative chest film has been ordered.   Findings: Faxitron imaging: Confirms the original marker within the tissue, the RF ID device accompanies the tissues, but is clearly free from the tissues.  Estimated Blood Loss: Minimal         Drains: None         Specimens: 2 breast tissue specimens from the medial left breast, one is marked only as the inferior margin, the other has the other 5 parameters readily identified with ink.       Complications: None                Condition: Stable   Ronny Bacon, M.D., [facility]  08/12/2019 ; 11:14 AM

## 2019-08-12 NOTE — Anesthesia Procedure Notes (Signed)
Procedure Name: LMA Insertion Date/Time: 08/12/2019 10:10 AM Performed by: Allean Found, CRNA Pre-anesthesia Checklist: Patient identified, Patient being monitored, Timeout performed, Emergency Drugs available and Suction available Patient Re-evaluated:Patient Re-evaluated prior to induction Oxygen Delivery Method: Circle system utilized Preoxygenation: Pre-oxygenation with 100% oxygen Induction Type: IV induction Ventilation: Mask ventilation without difficulty LMA: LMA inserted LMA Size: 3.0 Tube type: Oral Number of attempts: 2 Placement Confirmation: positive ETCO2 and breath sounds checked- equal and bilateral Tube secured with: Tape Dental Injury: Teeth and Oropharynx as per pre-operative assessment  Comments: LMA #4 decreased to #3
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
## 2019-08-12 NOTE — Anesthesia Postprocedure Evaluation (Signed)
Anesthesia Post Note  Patient: Latoya Stevenson  Procedure(s) Performed: BREAST LUMPECTOMY WITH RADIOFREQUENCY TAG IDENTIFICATION (Left ) BREAST LUMPECTOMY WITH SENTINEL LYMPH NODE BX (Left )  Patient location during evaluation: PACU Anesthesia Type: General Level of consciousness: awake and alert and oriented Pain management: pain level controlled Vital Signs Assessment: post-procedure vital signs reviewed and stable Respiratory status: spontaneous breathing, nonlabored ventilation and respiratory function stable Cardiovascular status: blood pressure returned to baseline and stable Postop Assessment: no signs of nausea or vomiting Anesthetic complications: no     Last Vitals:  Vitals:   08/12/19 1222 08/12/19 1240  BP: 125/75 137/71  Pulse: 67 66  Resp: 18 14  Temp: (!) 36.4 C (!) 36.2 C  SpO2: 98% 99%    Last Pain:  Vitals:   08/12/19 1240  TempSrc: Temporal  PainSc: 4                  [name]

## 2019-08-18 ENCOUNTER — Other Ambulatory Visit: Payer: Self-pay | Admitting: Anatomic Pathology & Clinical Pathology

## 2019-08-18 ENCOUNTER — Ambulatory Visit (INDEPENDENT_AMBULATORY_CARE_PROVIDER_SITE_OTHER): Payer: Self-pay | Admitting: Surgery

## 2019-08-18 ENCOUNTER — Other Ambulatory Visit: Payer: Self-pay

## 2019-08-18 ENCOUNTER — Encounter: Payer: Self-pay | Admitting: Licensed Clinical Social Worker

## 2019-08-18 ENCOUNTER — Inpatient Hospital Stay: Payer: Medicare Other | Attending: Oncology | Admitting: Licensed Clinical Social Worker

## 2019-08-18 ENCOUNTER — Encounter: Payer: Self-pay | Admitting: Surgery

## 2019-08-18 ENCOUNTER — Inpatient Hospital Stay: Payer: Medicare Other

## 2019-08-18 VITALS — BP 152/73 | HR 92 | Temp 97.9°F | Resp 12 | Ht 63.5 in | Wt 181.0 lb

## 2019-08-18 DIAGNOSIS — Z8042 Family history of malignant neoplasm of prostate: Secondary | ICD-10-CM

## 2019-08-18 DIAGNOSIS — C50312 Malignant neoplasm of 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: Secondary | ICD-10-CM | POA: Insufficient documentation

## 2019-08-18 DIAGNOSIS — Z8041 Family history of malignant neoplasm of ovary: Secondary | ICD-10-CM | POA: Diagnosis not present

## 2019-08-18 DIAGNOSIS — Z800 Family history of malignant neoplasm of digestive organs: Secondary | ICD-10-CM

## 2019-08-18 DIAGNOSIS — Z808 Family history of malignant neoplasm of other organs or systems: Secondary | ICD-10-CM | POA: Insufficient documentation

## 2019-08-18 DIAGNOSIS — Z170 Estrogen receptor positive status [ER+]: Secondary | ICD-10-CM | POA: Insufficient documentation

## 2019-08-18 DIAGNOSIS — Z803 Family history of malignant neoplasm of breast: Secondary | ICD-10-CM

## 2019-08-18 LAB — SURGICAL PATHOLOGY

## 2019-08-18 NOTE — Patient Instructions (Addendum)
Our office will contact you in October 2021 to schedule your mammogram in November 2021 and office visit in December 2021.   Please call the office if you have any questions or concerns.   Lumpectomy, Care After This sheet gives you information about how to care for yourself after your procedure. Your health care provider may also give you more specific instructions. If you have problems or questions, contact your health care provider. What can I expect after the procedure? After the procedure, it is common to have:  Breast swelling.  Breast tenderness.  Stiffness in your arm or shoulder.  A change in the shape and feel of your breast.  Scar tissue that feels hard to the touch in the area where the lump was removed. Follow these instructions at home: Medicines  Take over-the-counter and prescription medicines only as told by your health care provider.  If you were prescribed an antibiotic medicine, take it as told by your health care provider. Do not stop taking the antibiotic even if you start to feel better.  Ask your health care provider if the medicine prescribed to you: ? Requires you to avoid driving or using heavy machinery. ? Can cause constipation. You may need to take these actions to prevent or treat constipation:  Drink enough fluid to keep your urine pale yellow.  Take over-the-counter or prescription medicines.  Eat foods that are high in fiber, such as beans, whole grains, and fresh fruits and vegetables.  Limit foods that are high in fat and processed sugars, such as fried or sweet foods. Incision care      Follow instructions from your health care provider about how to take care of your incision. Make sure you: ? Wash your hands with soap and water before and after you change your bandage (dressing). If soap and water are not available, use hand sanitizer. ? Change your dressing as told by your health care provider. ? Leave stitches (sutures), skin glue,  or adhesive strips in place. These skin closures may need to stay in place for 2 weeks or longer. If adhesive strip edges start to loosen and curl up, you may trim the loose edges. Do not remove adhesive strips completely unless your health care provider tells you to do that.  Check your incision area every day for signs of infection. Check for: ? More redness, swelling, or pain. ? Fluid or blood. ? Warmth. ? Pus or a bad smell.  Keep your dressing clean and dry.  If you were sent home with a surgical drain in place, follow instructions from your health care provider about emptying it. Bathing  Do not take baths, swim, or use a hot tub until your health care provider approves.  Ask your health care provider if you may take showers. You may only be allowed to take sponge baths. Activity  Rest as told by your health care provider.  Avoid sitting for a long time without moving. Get up to take short walks every 1-2 hours. This is important to improve blood flow and breathing. Ask for help if you feel weak or unsteady.  Return to your normal activities as told by your health care provider. Ask your health care provider what activities are safe for you.  Be careful to avoid any activities that could cause an injury to your arm on the side of your surgery.  Do not lift anything that is heavier than 10 lb (4.5 kg), or the limit that you are told, until  your health care provider says that it is safe. Avoid lifting with the arm that is on the side of your surgery.  Do not carry heavy objects on your shoulder on the side of your surgery.  Do exercises to keep your shoulder and arm from getting stiff and swollen. Talk with your health care provider about which exercises are safe for you. General instructions  Wear a supportive bra as told by your health care provider.  Raise (elevate) your arm above the level of your heart while you are sitting or lying down.  Do not wear tight jewelry on  your arm, wrist, or fingers on the side of your surgery.  Keep all follow-up visits as told by your health care provider. This is important. ? You may need to be screened for extra fluid around the lymph nodes and swelling in the breast and arm (lymphedema). Follow instructions from your health care provider about how often you should be checked.  If you had any lymph nodes removed during your procedure, be sure to tell all of your health care providers. This is important information to share before you are involved in certain procedures, such as having blood tests or having your blood pressure taken. Contact a health care provider if:  You develop a rash.  You have a fever.  Your pain medicine is not working.  You have swelling, weakness, or numbness in your arm that does not improve after a few weeks.  You have new swelling in your breast.  You have any of these signs of infection: ? More redness, swelling, or pain in your incision area. ? Fluid or blood coming from your incision. ? Warmth coming from the incision area. ? Pus or a bad smell coming from your incision. Get help right away if you have:  Very bad pain in your breast or arm.  Swelling in your legs or arms.  Redness, warmth, or pain in your leg or arm.  Chest pain.  Difficulty breathing. Summary  After the procedure, it is common to have breast tenderness, swelling in your breast, and stiffness in your arm and shoulder.  Follow instructions from your health care provider about how to take care of your incision.  Do not lift anything that is heavier than 10 lb (4.5 kg), or the limit that you are told, until your health care provider says that it is safe. Avoid lifting with the arm that is on the side of your surgery.  If you had any lymph nodes removed during your procedure, be sure to tell all of your health care providers. This is important information to share before you are involved in certain procedures,  such as having blood tests or having your blood pressure taken. This information is not intended to replace advice given to you by your health care provider. Make sure you discuss any questions you have with your health care provider. Document Revised: 09/06/2018 Document Reviewed: 09/06/2018 Elsevier Patient Education  [place].

## 2019-08-18 NOTE — Progress Notes (Signed)
REFERRING PROVIDER: Sindy Guadeloupe, MD [place],  [place] [address]  PRIMARY PROVIDER:  Baxter Hire, MD  PRIMARY REASON FOR VISIT:  1. Malignant neoplasm of 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, unspecified estrogen receptor status ([place])   2. Family history of breast cancer   3. Family history of ovarian cancer   4. Family history of pancreatic cancer   5. Family history of prostate cancer   6. Family history of colon cancer   7. Family history of thyroid cancer      HISTORY OF PRESENT ILLNESS:   Latoya Stevenson, a 79 y.o. female, was seen for a [place] cancer genetics consultation at the request of Dr. Janese Banks due to a personal and family history of cancer.  Latoya Stevenson presents to clinic today to discuss the possibility of a hereditary predisposition to cancer, genetic testing, and to further clarify her future cancer risks, as well as potential cancer risks for family members.   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  In 2021, at the age of 89, Latoya Stevenson was diagnosed with invasive mammary carcinoma of the left breast, ER/PR+, Her2-. She had lumpectomy for this and will be treated with radiation and possibly antiestrogen therapy. Latoya Stevenson    CANCER HISTORY:  Oncology History   No history exists.     RISK FACTORS:  Menarche was at age 44.  First live birth at age 83.  Total hysterectomy at 49. Menopausal status: postmenopausal.  HRT use: 25 years. Mammogram within the last year: yes.  Past Medical History:  Diagnosis Date  . Allergic rhinitis   . Anemia    low iron  . Anxiety   . Arthritis 07/2019   osteoporosis  . Asthma without status asthmaticus    seasonal, not often  . Bilateral sciatica   . Breast cancer ([place]) 07/2019   invasive mammary cancer , grade 2  . Bronchitis   . Cancer ([place])     follicular variant papillary thyroid cancer  . Chronic back pain    unspecified  . Chronic kidney disease    ckd stage  3b  . Colon polyp    adenomatous  . DDD (degenerative disc disease), lumbar   . DJD (degenerative joint disease)   . Dysphagia   . Dyspnea   . Environmental allergies   . Family history of breast cancer   . Family history of colon cancer   . Family history of ovarian cancer   . Family history of pancreatic cancer   . Family history of prostate cancer   . Family history of thyroid cancer   . GERD (gastroesophageal reflux disease)   . Headache    migraines as a young woman  . History of recurrent UTIs   . Hyperlipidemia   . Hypertension   . Hypothyroidism   . IBS (irritable bowel syndrome)   . Lumbar disc disease   . Mold exposure 2002   hx of   . Osteoporosis, post-menopausal   . Pneumonia 1978  . Restless legs   . Scoliosis   . Spinal stenosis   . Thyroid nodule    aspirated in 1997 and 2003 which were negative  . Ulcer   . Varicose veins of left lower extremity   . Vertigo   . Wears dentures     Past Surgical History:  Procedure Laterality Date  . ABDOMINAL HYSTERECTOMY  1977   Dr. Laverta [place]  . ANKLE FRACTURE  SURGERY Left 2003   arch of foot.  no metal. per patient, she did not have surgery for this fracture. healed on its own  . ANTERIOR LAT LUMBAR FUSION Left 09/09/2016   Procedure: Left Lumbar two-three Anterolateral lumbar interbody fusion with lateral plate;  Surgeon: Erline Levine, MD;  Location: [facility];  Service: Neurosurgery;  Laterality: Left;  Left L2-3 Anterolateral lumbar interbody fusion with lateral plate  . APPENDECTOMY    . BACK SURGERY  2013, 2016, 2018   lumbar lam, fusion, discectomy.titanium  . BIOPSY THYROID     x2  thyroid nodule ; Dr. Sharlet [place] and one by Dr. Sabra Heck  . BREAST BIOPSY Left 08/02/2019   coil clip-INVASIVE MAMMARY CARCINOMA  . BREAST CYST ASPIRATION  1970's  . BREAST LUMPECTOMY WITH RADIOFREQUENCY TAG IDENTIFICATION Left 6/31/4970   Procedure: BREAST LUMPECTOMY WITH RADIOFREQUENCY TAG IDENTIFICATION;  Surgeon: Ronny Bacon, MD;   Location: [facility];  Service: General;  Laterality: Left;  . BREAST LUMPECTOMY WITH SENTINEL LYMPH NODE BIOPSY Left 08/12/2019   Procedure: BREAST LUMPECTOMY WITH SENTINEL LYMPH NODE BX;  Surgeon: Ronny Bacon, MD;  Location: [facility];  Service: General;  Laterality: Left;  . broken arm Left 1994   wrist fracture surgery, left.  metal has been replaced.  Latoya Stevenson CARDIOVASCULAR STRESS TEST     2010  . COLONOSCOPY  2010  . COLONOSCOPY  07/21/2013   PHX OF CP/REPEAT 64YRS/OH  . ESOPHAGOGASTRODUODENOSCOPY (EGD) WITH PROPOFOL N/A 09/22/2018   Procedure: ESOPHAGOGASTRODUODENOSCOPY (EGD) WITH PROPOFOL;  Surgeon: Toledo, Benay Pike, MD;  Location: [facility];  Service: Gastroenterology;  Laterality: N/A;  Patient to have Rapid COVID-19 day of procedure Kieth Brightly approved this)  . EYE SURGERY Bilateral    cataract surgery with lens implant  . LUMBAR LAMINECTOMY/DECOMPRESSION MICRODISCECTOMY  11/18/2011   Procedure: LUMBAR LAMINECTOMY/DECOMPRESSION MICRODISCECTOMY 2 LEVELS;  Surgeon: Erline Levine, MD;  Location: [facility];  Service: Neurosurgery;  Laterality: N/A;  Lumbar Three-Four,  Lumbar Four-Five Decompressive Laminectomy  . MALONEY DILATION N/A 09/22/2018   Procedure: MALONEY DILATION;  Surgeon: Toledo, Benay Pike, MD;  Location: [facility];  Service: Gastroenterology;  Laterality: N/A;  . OVARIAN CYST SURGERY    . POSTERIOR FUSION LUMBAR SPINE  2016  . TOTAL THYROIDECTOMY    . TRANSTHORACIC ECHOCARDIOGRAM  2010  . upper and lower GI  2010    Social History   Socioeconomic History  . Marital status: Widowed    Spouse name: Not on file  . Number of children: Not on file  . Years of education: Not on file  . Highest education level: Not on file  Occupational History  . 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 Occupation: housekeeping & dining at [employer]  Tobacco Use  . Smoking status: Never Smoker  . Smokeless tobacco: Never Used  Substance and Sexual  Activity  . Alcohol use: Yes    Comment: socially  . Drug use: No  . Sexual activity: Not Currently  Other Topics Concern  . Not on file  Social History Narrative   Lives with Quillian Quince (significant other).   Social Determinants of Health   Financial Resource Strain:   . Difficulty of Paying Living Expenses:   Food Insecurity:   . Worried About Charity fundraiser in the Last Year:   . Arboriculturist in the Last Year:   Transportation Needs:   . Film/video editor (Medical):   Latoya Stevenson Lack of Transportation (Non-Medical):  Physical Activity:   . Days of Exercise per Week:   . Minutes of Exercise per Session:   Stress:   . Feeling of Stress :   Social Connections:   . Frequency of Communication with Friends and Family:   . Frequency of Social Gatherings with Friends and Family:   . Attends Religious Services:   . Active Member of Clubs or Organizations:   . Attends Archivist Meetings:   Latoya Stevenson Marital Status:      FAMILY HISTORY:  We obtained a detailed, 4-generation family history.  Significant diagnoses are listed below: Family History  Problem Relation Age of Onset  . Breast cancer Mother 92  . Thyroid cancer Mother   . Pancreatic cancer Brother   . Pancreatic cancer Sister   . Suicidality Father   . Prostate cancer Brother   . Colon cancer Brother   . Breast cancer Sister        x 2 times  . Heart attack Sister   . Ovarian cancer Maternal Aunt   . Breast cancer Paternal Aunt   . Lung cancer Paternal Uncle   . Breast cancer Other   . Colon cancer Maternal Aunt   . Pancreatic cancer Maternal Aunt    Ms. Attwood has a son, 16 and a daughter, 71. She had 2 sisters and 3 brothers. One of her sisters had pancreatic cancer at 53 and died at 70. One of her brothers also had pancreatic cancer and died at 72. Another sister had breast cancer in her left breast in her 21s and in her right in her 82s. Another brother had prostate cancer and colon cancer, she notes he  was exposed to agent orange in [place]. 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   Ms. Turi mother was diagnosed with thyroid cancer, and diagnosed with breast cancer in her 75s and died at 66. Patient had 4 maternal aunts and 1 uncle. One of her aunts had ovarian cancer and died at 46. Another aunt had colon cancer, and another aunt had pancreatic cancer. No known cancers in first cousins. Maternal grandmother died in her 76s due to heart problems, grandfather died in his 41s.   Ms. Brahmbhatt father died at 49 by suicide. Patient had 10 paternal aunts/uncles. One of her aunts had breast cancer recently in her 12s. An uncle had lung cancer. A paternal cousin had breast cancer in her 88s. Paternal grandmother died in her 51s, grandfather died in his 7s.   Ms. Sandin is unaware of previous family history of genetic testing for hereditary cancer risks. Patient's maternal ancestors are of [place], [place] and [place] descent, and paternal ancestors are of [place], [place] and [place] descent. There is no reported Ashkenazi Jewish ancestry. There is no known consanguinity.  GENETIC COUNSELING ASSESSMENT: Ms. Aispuro is a 79 y.o. female with a personal and family history of breast cancer which is somewhat suggestive of a hereditary cancer syndrome and predisposition to cancer. We, therefore, discussed and recommended the following at today's visit.   DISCUSSION: We discussed that 5 - 10% of breast cancer is hereditary, with most cases associated with BRCA1/BRCA2 mutations.  There are other genes that can be associated with hereditary breast cancer syndromes.  There are also genes associated with other cancers seen in her family such as ovarian and pancreatic. We discussed that testing is beneficial for several reasons including knowing  how to follow individuals after completing their treatment, and understand if  other family members could be at risk for cancer and allow them to undergo genetic testing.   We reviewed the characteristics, features and inheritance patterns of hereditary cancer syndromes. We also discussed genetic testing, including the appropriate family members to test, the process of testing, insurance coverage and turn-around-time for results. We discussed the implications of a negative, positive and/or variant of uncertain significant result. We recommended Ms. Wernli pursue genetic testing for the [facility] Common Hereditary Cancers gene panel.    The Common Hereditary Cancers Panel offered by [facility] includes sequencing and/or deletion duplication testing of the following 48 genes: APC, ATM, AXIN2, BARD1, BMPR1A, BRCA1, BRCA2, BRIP1, CDH1, CDKN2A (p14ARF), CDKN2A (p16INK4a), CKD4, CHEK2, CTNNA1, DICER1, EPCAM (Deletion/duplication testing only), GREM1 (promoter region deletion/duplication testing only), KIT, MEN1, MLH1, MSH2, MSH3, MSH6, MUTYH, NBN, NF1, NHTL1, PALB2, PDGFRA, PMS2, POLD1, POLE, PTEN, RAD50, RAD51C, RAD51D, RNF43, SDHB, SDHC, SDHD, SMAD4, SMARCA4. STK11, TP53, TSC1, TSC2, and VHL.  The following genes were evaluated for sequence changes only: SDHA and HOXB13 c.251G>A variant only.  Based on Ms. Konen's personal and family history of cancer, she meets medical criteria for genetic testing. Despite that she meets criteria, she may still have an out of pocket cost. We discussed that if her out of pocket cost for testing is over $100, the laboratory will call and confirm whether she wants to proceed with testing.  If the out of pocket cost of testing is less than $100 she will be billed by the genetic testing laboratory.   PLAN: After considering the risks, benefits, and limitations, Ms. Breeding provided informed consent to pursue genetic testing and the blood sample was sent to [facility] for analysis of the Common Hereditary Cancers Panel. Results should be available within  approximately 2-3 weeks' time, at which point they will be disclosed by telephone to Ms. Fruin, as will any additional recommendations warranted by these results. Ms. Burruel will receive a summary of her genetic counseling visit and a copy of her results once available. This information will also be available in Epic.    Ms. Myint questions were answered to her satisfaction today. Our contact information was provided should additional questions or concerns arise. Thank you for the referral and allowing [place] to share in the care of your patient.   Faith Rogue, MS, [facility] Genetic Counselor [email or web address] Phone: [phone]  The patient was seen for a total of 45 minutes in face-to-face genetic counseling.  Dr. Grayland Ormond was available for discussion regarding this case.   _______________________________________________________________________ For Office Staff:  Number of people involved in session: 1 Was an Intern/ student involved with case: no

## 2019-08-18 NOTE — Progress Notes (Signed)
[facility] POST-OP OFFICE VISIT  08/18/2019  HPI: Latoya Stevenson is a 79 y.o. female 6 days s/p Left breast lumpectomy and SLNBx.  She denies any significant postoperative pain, taking only a couple pain pills.  She presents today feeling well, denying any fevers or chills.  Vital signs: BP (!) 152/73   Pulse 92   Temp 97.9 F (36.6 C)   Resp 12   Ht 5' 3.5" (1.613 m)   Wt 181 lb (82.1 kg)   SpO2 97%   BMI 31.56 kg/m    Physical Exam: Constitutional: She appears well. Left breast/axilla: Free of any swelling or induration.  Expected resolving ecchymosis in the region. Skin: No erythema, or drainage from the incisions.  They all appear clean dry and intact.  Current pathology verbal report from chat with Dr. Allena Napoleon: Just about to finalize this patient's breast excision. Invasive mammary carcinoma, no special type, grade 2. Overall staging is pT1b pN0. 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  REPORT: SURGICAL PATHOLOGY  CASE: [id number]  PATIENT: Latoya Stevenson  Surgical Pathology Report      Specimen Submitted:  A. Breast tissue, left medial  B. Sentinel lymph node, left   Clinical History: Left breast cancer     DIAGNOSIS:  A. BREAST, LEFT MEDIAL; PARTIAL MASTECTOMY:  - INVASIVE MAMMARY CARCINOMA, NO SPECIAL TYPE.  - DUCTAL CARCINOMA IN SITU, INTERMEDIATE GRADE.  - CLIP AND BIOPSY SITE IDENTIFIED, PRESENT AT ANTERIOR MARGIN.  - BACKGROUND FIBROCYSTIC CHANGES AND INTRADUCTAL PAPILLOMA.  - RADIOFREQUENCY TAG PRESENT LOOSE IN SPECIMEN CONTAINER.  - SEE CANCER SUMMARY.   B. LYMPH NODE, LEFT SENTINEL; EXCISION:  - ONE LYMPH NODE, NEGATIVE FOR MALIGNANCY (0/1).   CANCER CASE SUMMARY: INVASIVE CARCINOMA OF THE BREAST  Procedure: Excision  Specimen Laterality: Left  Tumor Size: 7 mm  Histologic Type: Invasive carcinoma of no special type (ductal)  Histologic Grade (Nottingham Histologic Score)             Glandular (Acinar)/Tubular Differentiation: 3            Nuclear Pleomorphism: 2            Mitotic Rate: 1            Overall Grade: Grade 2  Ductal Carcinoma In Situ (DCIS): Present, intermediate grade  Margins:            Invasive Carcinoma Margins: Positive for invasive  carcinoma            Anterior, unifocal             DCIS Margins: Positive for DCIS            Anterior, unifocal   Regional Lymph Nodes: Uninvolved by tumor cells            Total Number of Lymph Nodes Examined: 1            Number of Sentinel Nodes Examined: 1   Treatment Effect in the Breast: No known presurgical therapy  Lymphovascular Invasion: Not identified  Pathologic Stage Classification (pTNM, AJCC 8th Edition): pT1b pN0 (sn)  TNM Descriptors:  Breast Biomarker Testing Performed on Previous Biopsy: ARS 21-2721   BREAST BIOMARKER TESTS  Estrogen Receptor (ER) Status: POSITIVE            Percentage of cells with nuclear positivity: >90%           Average intensity of staining: Strong   Progesterone Receptor (PgR) Status: POSITIVE  Percentage of cells with nuclear positivity: >90%            Average intensity of staining: Strong   HER2 (by immunohistochemistry): NEGATIVE (Score 0)   GROSS DESCRIPTION:  A. Labeled: Medial left breast tissue  Received: Fresh  Specimen radiograph image(s) available for review  Radiographic findings: A coil-shaped marker along with an RFID marker  which is free-floating in the specimen container  Time in fixative: 11:10 AM on 08/12/2019   Cold ischemic time: 12 minutes  Total fixation time: 97 hours  Type of procedure: Breast lumpectomy with radiofrequency tag  identification  Location / laterality of specimen: Left  Orientation of specimen: The specimen is received previously inked from  the operating room.  The specimen is received in 2 pieces  Inking:  Anterior = green  Inferior = blue  Lateral = orange  Medial = yellow  Posterior = black  Superior = red  Separately submitted piece = blue  Size of specimen: Superior-inferior-1.5 cm, medial-lateral 3.3 cm,  anterior-posterior 3.2 cm, separately submitted tissue 3.3 x 2.7 x 0.5  cm.  Skin: None identified  Biopsy site: Marker clip identified  Number of discrete masses: 1  Size of mass(es): 0.7 x 0.6 x 0.6  Description of mass(es): A poorly defined tan-white stellate mass which  shows a few cystic spaces from 0.1-0.2 cm containing brown soft material  Distance between masses/clips: Not applicable  Margins: The mass is grossly located less than 0.1 cm away from the  overlying perpendicular anterior margin of resection, possibly involving  it. The mass is grossly located 0.9 cm away from the overlying  perpendicular superior margin, and 1 cm or greater from all other  margins of resection.  Description of remainder of tissue: The rest of the breast tissue is  predominantly fatty. Serial sectioning through this second portion of  breast tissue reveals a homogeneous fatty cut surface.   Assessment/Plan: This is a 79 y.o. female 6 days s/p excision of above, now with anterior positive margin for both invasive carcinoma and DCIS.  Patient Active Problem List   Diagnosis Date Noted  . Goals of care, counseling/discussion 08/12/2019  . Malignant neoplasm of 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 ([place]) 08/04/2019  . Hyperlipidemia 02/03/2018  . Lymphedema 10/14/2017  . Hypertension 08/19/2017  . Swelling of limb 08/19/2017  . Pain in limb 08/19/2017  . Lumbar compression fracture ([place]) 12/02/2016  . 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 Lumbar scoliosis 10/27/2014    - Margin for DCIS and invasive carcinoma appears to be unifocal.  Considering this patient's age, her plans for both hormonal blockade and radiation  treatment, it is uncertain how much she will gain by reexcision and diminishing her recurrence risk.  However, I will discuss with with her and terms of achieving a standard of care prior to initiating her future therapies.  I placed calls twice this afternoon to get in touch with her to discuss this and will try again tomorrow.   [name], FACS 08/18/2019, 1:08 PM

## 2019-08-23 ENCOUNTER — Ambulatory Visit: Payer: Medicare Other | Admitting: Oncology

## 2019-08-29 ENCOUNTER — Ambulatory Visit: Payer: Self-pay | Admitting: Licensed Clinical Social Worker

## 2019-08-29 ENCOUNTER — Encounter: Payer: Self-pay | Admitting: Licensed Clinical Social Worker

## 2019-08-29 ENCOUNTER — Telehealth: Payer: Self-pay | Admitting: Licensed Clinical Social Worker

## 2019-08-29 DIAGNOSIS — Z808 Family history of malignant neoplasm of other organs or systems: Secondary | ICD-10-CM

## 2019-08-29 DIAGNOSIS — Z1379 Encounter for other screening for genetic and chromosomal anomalies: Secondary | ICD-10-CM | POA: Insufficient documentation

## 2019-08-29 DIAGNOSIS — Z8042 Family history of malignant neoplasm of prostate: Secondary | ICD-10-CM

## 2019-08-29 DIAGNOSIS — Z803 Family history of malignant neoplasm of breast: Secondary | ICD-10-CM

## 2019-08-29 DIAGNOSIS — C50312 Malignant neoplasm of 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: Secondary | ICD-10-CM

## 2019-08-29 DIAGNOSIS — Z8041 Family history of malignant neoplasm of ovary: Secondary | ICD-10-CM

## 2019-08-29 DIAGNOSIS — Z800 Family history of malignant neoplasm of digestive organs: Secondary | ICD-10-CM

## 2019-08-29 NOTE — Progress Notes (Signed)
HPI:  Latoya Stevenson was previously seen in the [place] clinic due to a personal and family history of cancer and concerns regarding a hereditary predisposition to cancer. Please refer to our prior cancer genetics clinic note for more information regarding our discussion, assessment and recommendations, at the time. Latoya Stevenson recent genetic test results were disclosed to her, as were recommendations warranted by these results. These results and recommendations are discussed in more detail below.  CANCER HISTORY:  Oncology History  Malignant neoplasm of 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 ([place])  08/04/2019 Initial Diagnosis   Malignant neoplasm of 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 [facility])    Genetic Testing   Negative genetic testing. No pathogenic variants identified on the [facility] Common Hereditary Cancers Panel + Thyroid Cancer Panel. The report date is 08/26/2019.  The Common Hereditary Cancers Panel + Thyroid Cancer Panel offered by [facility] includes sequencing and/or deletion duplication testing of the following 50 genes: APC, ATM, AXIN2, BARD1, BMPR1A, BRCA1, BRCA2, BRIP1, CDH1, CDKN2A (p14ARF), CDKN2A (p16INK4a), CKD4, CHEK2, CTNNA1, DICER1, EPCAM (Deletion/duplication testing only), GREM1 (promoter region deletion/duplication testing only), KIT, MEN1, MLH1, MSH2, MSH3, MSH6, MUTYH, NBN, NF1, NHTL1, PALB2, PDGFRA, PMS2, POLD1, POLE, PRKAR1A, PTEN, RAD50, RAD51C, RAD51D, RNF43, RET, SDHB, SDHC, SDHD, SMAD4, SMARCA4. STK11, TP53, TSC1, TSC2, and VHL.  The following genes were evaluated for sequence changes only: SDHA and HOXB13 c.251G>A variant only.     FAMILY HISTORY:  We obtained a detailed, 4-generation family history.  Significant diagnoses are listed below: Family History  Problem Relation Age of Onset  . Breast cancer Mother 75  . Thyroid cancer Mother   . Pancreatic cancer Brother   . Pancreatic cancer Sister   . Suicidality Father   . Prostate cancer  Brother   . Colon cancer Brother   . Breast cancer Sister        x 2 times  . Heart attack Sister   . Ovarian cancer Maternal Aunt   . Breast cancer Paternal Aunt   . Lung cancer Paternal Uncle   . Breast cancer Other   . Colon cancer Maternal Aunt   . Pancreatic cancer Maternal Aunt    Latoya Stevenson has a son, 70 and a daughter, 24. She had 2 sisters and 3 brothers. One of her sisters had pancreatic cancer at 34 and died at 79. One of her brothers also had pancreatic cancer and died at 55. Another sister had breast cancer in her left breast in her 36s and in her right in her 66s. Another brother had prostate cancer and colon cancer, she notes he was exposed to agent orange in [place]. 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   Latoya Stevenson mother was diagnosed with thyroid cancer, and diagnosed with breast cancer in her 60s and died at 53. Patient had 4 maternal aunts and 1 uncle. One of her aunts had ovarian cancer and died at 60. Another aunt had colon cancer, and another aunt had pancreatic cancer. No known cancers in first cousins. Maternal grandmother died in her 30s due to heart problems, grandfather died in his 62s.   Latoya Stevenson father died at 40 by suicide. Patient had 10 paternal aunts/uncles. One of her aunts had breast cancer recently in her 84s. An uncle had lung cancer. A paternal cousin had breast cancer in her 50s. Paternal grandmother died in her 87s, grandfather died  in his 54s.   Latoya Stevenson is unaware of previous family history of genetic testing for hereditary cancer risks. Patient's maternal ancestors are of [place], [place] and [place] descent, and paternal ancestors are of [place], [place] and [place] descent. There is no reported Ashkenazi Jewish ancestry. There is no known consanguinity.   GENETIC TEST RESULTS: Genetic testing reported out on 08/26/2019 through the [facility]  Common Hereditary Cancers Panel + Thyroid cancer panel found no pathogenic mutations.   The Common Hereditary Cancers Panel + Thyroid Cancer Panel offered by [facility] includes sequencing and/or deletion duplication testing of the following 50 genes: APC, ATM, AXIN2, BARD1, BMPR1A, BRCA1, BRCA2, BRIP1, CDH1, CDKN2A (p14ARF), CDKN2A (p16INK4a), CKD4, CHEK2, CTNNA1, DICER1, EPCAM (Deletion/duplication testing only), GREM1 (promoter region deletion/duplication testing only), KIT, MEN1, MLH1, MSH2, MSH3, MSH6, MUTYH, NBN, NF1, NHTL1, PALB2, PDGFRA, PMS2, POLD1, POLE, PRKAR1A, PTEN, RAD50, RAD51C, RAD51D, RNF43, RET, SDHB, SDHC, SDHD, SMAD4, SMARCA4. STK11, TP53, TSC1, TSC2, and VHL.  The following genes were evaluated for sequence changes only: SDHA and HOXB13 c.251G>A variant only.  The test report has been scanned into EPIC and is located under the Molecular Pathology section of the Results Review tab.  A portion of the result report is included below for reference.     We discussed with Latoya Stevenson that because current genetic testing is not perfect, it is possible there may be a gene mutation in one of these genes that current testing cannot detect, but that chance is small.  We also discussed, that there could be another gene that has not yet been discovered, or that we have not yet tested, that is responsible for the cancer diagnoses in the family. It is also possible there is a hereditary cause for the cancer in the family that Latoya Stevenson did not inherit and therefore was not identified in her testing.  Therefore, it is important to remain in touch with cancer genetics in the future so that we can continue to offer Latoya Stevenson the most up to date genetic testing.   ADDITIONAL GENETIC TESTING: We discussed with Latoya Stevenson that her genetic testing was fairly extensive.  If there are genes identified to increase cancer risk that can be analyzed in the future, we would be happy to discuss and coordinate this  testing at that time.    CANCER SCREENING RECOMMENDATIONS: Latoya Stevenson test result is considered negative (normal).  This means that we have not identified a hereditary cause for her  personal and family history of cancer at this time. Most cancers happen by chance and this negative test suggests that her cancer may fall into this category.    While reassuring, this does not definitively rule out a hereditary predisposition to cancer. It is still possible that there could be genetic mutations that are undetectable by current technology. There could be genetic mutations in genes that have not been tested or identified to increase cancer risk.  Therefore, it is recommended she continue to follow the cancer management and screening guidelines provided by her oncology and primary healthcare provider.   An individual's cancer risk and medical management are not determined by genetic test results alone. Overall cancer risk assessment incorporates additional factors, including personal medical history, family history, and any available genetic information that may result in a personalized plan for cancer prevention and surveillance.  RECOMMENDATIONS FOR FAMILY MEMBERS:  Relatives in this family might be at some increased risk of developing cancer, over the general population risk, simply due to the  family history of cancer.  We recommended female relatives in this family have a yearly mammogram beginning at age 22, or 44 years younger than the earliest onset of cancer, an annual clinical breast exam, and perform monthly breast self-exams. Female relatives in this family should also have a gynecological exam as recommended by their primary provider. All family members should have a colonoscopy by age 77, or as directed by their physicians.   It is also possible there is a hereditary cause for the cancer in Latoya Stevenson's family that she did not inherit and therefore was not identified in her.  Based on Latoya Stevenson's  family history, we recommended her siblings, maternal and paternal relatives (especially those who have had cancer)  have genetic counseling and testing. Latoya Stevenson will let us know if we can be of any assistance in coordinating genetic counseling and/or testing for these family members.  FOLLOW-UP: Lastly, we discussed with Latoya Stevenson that cancer genetics is a rapidly advancing field and it is possible that new genetic tests will be appropriate for her and/or her family members in the future. We encouraged her to remain in contact with cancer genetics on an annual basis so we can update her personal and family histories and let her know of advances in cancer genetics that may benefit this family.   Our contact number was provided. Latoya Stevenson questions were answered to her satisfaction, and she knows she is welcome to call us at anytime with additional questions or concerns.   Faith Rogue, MS, [facility] Genetic Counselor [email or web address] Phone: [phone]

## 2019-08-29 NOTE — Telephone Encounter (Signed)
Revealed negative genetic testing.   We discussed that we do not know why she has breast cancer or why there is cancer in the family. It could be due to a different gene that we are not testing, or something our current technology cannot pick up.  It will be important for her to keep in contact with genetics to learn if additional testing may be needed in the future.  

## 2019-08-31 ENCOUNTER — Institutional Professional Consult (permissible substitution): Payer: Medicare Other | Admitting: Radiation Oncology

## 2019-09-01 ENCOUNTER — Encounter: Payer: Self-pay | Admitting: Surgery

## 2019-09-01 ENCOUNTER — Ambulatory Visit
Admission: RE | Admit: 2019-09-01 | Discharge: 2019-09-01 | Disposition: A | Discharge status: Home / Self Care | Payer: Medicare Other | Source: Ambulatory Visit | Attending: Radiation Oncology | Admitting: Radiation Oncology

## 2019-09-01 ENCOUNTER — Inpatient Hospital Stay (HOSPITAL_BASED_OUTPATIENT_CLINIC_OR_DEPARTMENT_OTHER): Payer: Medicare Other | Admitting: Oncology

## 2019-09-01 ENCOUNTER — Ambulatory Visit (INDEPENDENT_AMBULATORY_CARE_PROVIDER_SITE_OTHER): Payer: Self-pay | Admitting: Surgery

## 2019-09-01 ENCOUNTER — Encounter: Payer: Self-pay | Admitting: Oncology

## 2019-09-01 ENCOUNTER — Encounter: Payer: Self-pay | Admitting: Radiation Oncology

## 2019-09-01 ENCOUNTER — Other Ambulatory Visit: Payer: Self-pay

## 2019-09-01 VITALS — BP 147/92 | HR 79 | Temp 96.8°F | Resp 16 | Wt 178.3 lb

## 2019-09-01 VITALS — BP 147/92 | HR 79 | Resp 18 | Wt 178.0 lb

## 2019-09-01 VITALS — BP 162/84 | HR 73 | Temp 97.7°F | Resp 12 | Ht 63.0 in | Wt 179.0 lb

## 2019-09-01 DIAGNOSIS — M810 Age-related osteoporosis without current pathological fracture: Secondary | ICD-10-CM | POA: Diagnosis not present

## 2019-09-01 DIAGNOSIS — C50012 Malignant neoplasm of nipple and areola, left female breast: Secondary | ICD-10-CM | POA: Insufficient documentation

## 2019-09-01 DIAGNOSIS — Z79899 Other long term (current) drug therapy: Secondary | ICD-10-CM | POA: Insufficient documentation

## 2019-09-01 DIAGNOSIS — D649 Anemia, unspecified: Secondary | ICD-10-CM | POA: Insufficient documentation

## 2019-09-01 DIAGNOSIS — E039 Hypothyroidism, unspecified: Secondary | ICD-10-CM | POA: Insufficient documentation

## 2019-09-01 DIAGNOSIS — N189 Chronic kidney disease, unspecified: Secondary | ICD-10-CM | POA: Diagnosis not present

## 2019-09-01 DIAGNOSIS — E785 Hyperlipidemia, unspecified: Secondary | ICD-10-CM | POA: Diagnosis not present

## 2019-09-01 DIAGNOSIS — M5136 Other intervertebral disc degeneration, lumbar region: Secondary | ICD-10-CM | POA: Diagnosis not present

## 2019-09-01 DIAGNOSIS — C50312 Malignant neoplasm of 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: Secondary | ICD-10-CM

## 2019-09-01 DIAGNOSIS — K219 Gastro-esophageal reflux disease without esophagitis: Secondary | ICD-10-CM | POA: Diagnosis not present

## 2019-09-01 DIAGNOSIS — Z8042 Family history of malignant neoplasm of prostate: Secondary | ICD-10-CM | POA: Insufficient documentation

## 2019-09-01 DIAGNOSIS — I129 Hypertensive chronic kidney disease with stage 1 through stage 4 chronic kidney disease, or unspecified chronic kidney disease: Secondary | ICD-10-CM | POA: Insufficient documentation

## 2019-09-01 DIAGNOSIS — Z800 Family history of malignant neoplasm of digestive organs: Secondary | ICD-10-CM | POA: Diagnosis not present

## 2019-09-01 DIAGNOSIS — Z7189 Other specified counseling: Secondary | ICD-10-CM | POA: Diagnosis not present

## 2019-09-01 DIAGNOSIS — D631 Anemia in chronic kidney disease: Secondary | ICD-10-CM | POA: Insufficient documentation

## 2019-09-01 DIAGNOSIS — Z801 Family history of malignant neoplasm of trachea, bronchus and lung: Secondary | ICD-10-CM | POA: Diagnosis not present

## 2019-09-01 DIAGNOSIS — F419 Anxiety disorder, unspecified: Secondary | ICD-10-CM | POA: Insufficient documentation

## 2019-09-01 DIAGNOSIS — Z170 Estrogen receptor positive status [ER+]: Secondary | ICD-10-CM | POA: Diagnosis not present

## 2019-09-01 DIAGNOSIS — Z8041 Family history of malignant neoplasm of ovary: Secondary | ICD-10-CM | POA: Insufficient documentation

## 2019-09-01 DIAGNOSIS — Z803 Family history of malignant neoplasm of breast: Secondary | ICD-10-CM | POA: Insufficient documentation

## 2019-09-01 DIAGNOSIS — Z86010 Personal history of colonic polyps: Secondary | ICD-10-CM | POA: Diagnosis not present

## 2019-09-01 NOTE — Progress Notes (Signed)
Patient here today for follow up regarding breast cancer . 

## 2019-09-01 NOTE — Consult Note (Signed)
NEW PATIENT EVALUATION  Name: Latoya Stevenson  MRN: [id number]  Date:   09/01/2019     DOB: February 05, 1941   This 79 y.o. 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  REFERRING PHYSICIAN: Baxter Hire, MD  CHIEF COMPLAINT:  Chief Complaint  Patient presents with  . 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   PREVIOUS INVESTIGATIONS:  Mammogram and ultrasound reviewed Pathology report reviewed Clinical notes reviewed  HPI: Patient is a 80 year old female status post [place] mammogram in November 2020 which was normal.  She had nipple discharge in her right breast which prompted right breast mammogram.  MRI was also recommended and she had bilateral MRI showing no findings in the right breast however there was an enhancing mass in the inner left breast measuring 0.9 cm in greatest dimension suspicious.  Biopsy was consistent with invasive mammary carcinoma grade 2.6 cm.  Tumor was strongly ER PR positive HER-2/neu negative.  She underwent wide local excision showing a 7 mm overall grade 2 invasive mammary carcinoma with anterior unifocal margin positive for invasive As well as DCIS.  1 sentinel node was negative.  Patient has done well postoperatively.  Patient specifically [place] breast tenderness cough or bone pain.  PLANNED TREATMENT REGIMEN: Left hypofractionated radiation therapy with scar boost  PAST MEDICAL HISTORY:  has a past medical history of Allergic rhinitis, Anemia, Anxiety, Arthritis (07/2019), Asthma without status asthmaticus, Bilateral sciatica, Breast cancer ([place]) (07/2019), Bronchitis, Cancer ([place]), Chronic back pain, Chronic kidney disease, Colon polyp, DDD (degenerative disc  disease), lumbar, DJD (degenerative joint disease), Dysphagia, Dyspnea, Environmental allergies, Family history of breast cancer, Family history of colon cancer, Family history of ovarian cancer, Family history of pancreatic cancer, Family history of prostate cancer, Family history of thyroid cancer, GERD (gastroesophageal reflux disease), Headache, History of recurrent UTIs, Hyperlipidemia, Hypertension, Hypothyroidism, IBS (irritable bowel syndrome), Lumbar disc disease, Mold exposure (2002), Osteoporosis, post-menopausal, Pneumonia (1978), Restless legs, Scoliosis, Spinal stenosis, Thyroid nodule, Ulcer, Varicose veins of left lower extremity, Vertigo, and Wears dentures.    PAST SURGICAL HISTORY:  Past Surgical History:  Procedure Laterality Date  . ABDOMINAL HYSTERECTOMY  1977   Dr. Laverta Baltimore  . ANKLE FRACTURE SURGERY Left 2003   arch of foot.  no metal. per patient, she did not have surgery for this fracture. healed on its own  . ANTERIOR LAT LUMBAR FUSION Left 09/09/2016   Procedure: Left Lumbar two-three Anterolateral lumbar interbody fusion with lateral plate;  Surgeon: Erline Levine, MD;  Location: [facility];  Service: Neurosurgery;  Laterality: Left;  Left L2-3 Anterolateral lumbar interbody fusion with lateral plate  . APPENDECTOMY    . BACK SURGERY  2013, 2016, 2018   lumbar lam, fusion, discectomy.titanium  . BIOPSY THYROID     x2  thyroid nodule ; Dr. Sharlet Salina and one by Dr. Sabra Heck  . BREAST BIOPSY Left 08/02/2019   coil clip-INVASIVE MAMMARY CARCINOMA  . BREAST CYST ASPIRATION  1970's  . BREAST LUMPECTOMY WITH RADIOFREQUENCY TAG IDENTIFICATION Left 12/07/3005   Procedure: BREAST LUMPECTOMY WITH RADIOFREQUENCY TAG IDENTIFICATION;  Surgeon: Ronny Bacon, MD;  Location: [facility];  Service: General;  Laterality: Left;  . BREAST LUMPECTOMY WITH SENTINEL LYMPH NODE BIOPSY Left 08/12/2019   Procedure: BREAST LUMPECTOMY WITH  SENTINEL LYMPH NODE BX;  Surgeon: Ronny Bacon, MD;  Location:  [facility];  Service: General;  Laterality: Left;  . broken arm Left 1994   wrist fracture surgery, left.  metal has been replaced.  Marland Kitchen CARDIOVASCULAR STRESS TEST     2010  . COLONOSCOPY  2010  . COLONOSCOPY  07/21/2013   PHX OF CP/REPEAT 71YRS/[place]  . ESOPHAGOGASTRODUODENOSCOPY (EGD) WITH PROPOFOL N/A 09/22/2018   Procedure: ESOPHAGOGASTRODUODENOSCOPY (EGD) WITH PROPOFOL;  Surgeon: Toledo, Benay Pike, MD;  Location: [facility];  Service: Gastroenterology;  Laterality: N/A;  Patient to have Rapid COVID-19 day of procedure Kieth Brightly approved this)  . EYE SURGERY Bilateral    cataract surgery with lens implant  . LUMBAR LAMINECTOMY/DECOMPRESSION MICRODISCECTOMY  11/18/2011   Procedure: LUMBAR LAMINECTOMY/DECOMPRESSION MICRODISCECTOMY 2 LEVELS;  Surgeon: Erline Levine, MD;  Location: [facility];  Service: Neurosurgery;  Laterality: N/A;  Lumbar Three-Four,  Lumbar Four-Five Decompressive Laminectomy  . MALONEY DILATION N/A 09/22/2018   Procedure: MALONEY DILATION;  Surgeon: Toledo, Benay Pike, MD;  Location: [facility];  Service: Gastroenterology;  Laterality: N/A;  . OVARIAN CYST SURGERY    . POSTERIOR FUSION LUMBAR SPINE  2016  . TOTAL THYROIDECTOMY    . TRANSTHORACIC ECHOCARDIOGRAM  2010  . upper and lower GI  2010    FAMILY HISTORY: family history includes Breast cancer in her paternal aunt, sister, and another family member; Breast cancer (age of onset: 72) in her mother; Colon cancer in her brother and maternal aunt; Heart attack in her sister; Lung cancer in her paternal uncle; Ovarian cancer in her maternal aunt; Pancreatic cancer in her brother, maternal aunt, and sister; Prostate cancer in her brother; Suicidality in her father; Thyroid cancer in her mother.  SOCIAL HISTORY:  reports that she has never smoked. She has never used smokeless tobacco. She reports current alcohol use. She reports that she does not use drugs.  ALLERGIES: Flagyl [metronidazole hcl], Furosemide, Macrodantin  [nitrofurantoin macrocrystal], Penicillins, Celebrex [celecoxib], and Other  MEDICATIONS:  Current Outpatient Medications  Medication Sig Dispense Refill  . acetaminophen (TYLENOL) 500 MG tablet Take 1,000 mg by mouth every 6 (six) hours as needed.    Marland Kitchen acidophilus (RISAQUAD) CAPS capsule Take 1 capsule by mouth daily.    Marland Kitchen amLODipine (NORVASC) 5 MG tablet Take 5 mg by mouth daily. Takes in the evening    . atorvastatin (LIPITOR) 40 MG tablet Take 40 mg by mouth at bedtime.     . Calcium Carbonate-Vitamin D (CALCIUM 600+D) 600-400 MG-UNIT tablet Take 2 tablets by mouth daily.    . chlorthalidone (HYGROTON) 25 MG tablet Take 25 mg by mouth in the morning and at bedtime.   1  . cholecalciferol (VITAMIN D3) 25 MCG (1000 UNIT) tablet Take 1,000 Units by mouth daily.    . diphenhydrAMINE (SOMINEX) 25 MG tablet Take 25 mg by mouth at bedtime.     Marland Kitchen doxylamine, Sleep, (UNISOM) 25 MG tablet Take 25 mg by mouth at bedtime.    . fexofenadine (ALLEGRA) 180 MG tablet Take 180 mg by mouth daily.     . fluticasone (FLONASE) 50 MCG/ACT nasal spray Place 2 sprays into both nostrils daily as needed for allergies.     Marland Kitchen GLUCOSAMINE-CHONDROITIN PO Take 1 tablet by mouth daily.     . hydrochlorothiazide (HYDRODIURIL) 25 MG tablet Take 25 mg by mouth 2 (two) times daily.    . irbesartan (AVAPRO) 300 MG tablet Take 300 mg by mouth daily.     Marland Kitchen levothyroxine (  SYNTHROID) 175 MCG tablet Take 175 mcg by mouth every morning.    . Multiple Vitamin (MULTIVITAMIN WITH MINERALS) TABS Take 1 tablet by mouth daily.    . nabumetone (RELAFEN) 500 MG tablet Take 500 mg by mouth 2 (two) times daily.     Marland Kitchen omeprazole (PRILOSEC) 40 MG capsule Take 40 mg by mouth daily before breakfast.     . vitamin E (VITAMIN E) 180 MG (400 UNITS) capsule Take 400 Units by mouth daily.     No current facility-administered medications for this encounter.    ECOG PERFORMANCE STATUS:  0 - Asymptomatic  REVIEW OF SYSTEMS: Patient denies any  weight loss, fatigue, weakness, fever, chills or night sweats. Patient denies any loss of vision, blurred vision. Patient denies any ringing  of the ears or hearing loss. No irregular heartbeat. Patient denies heart murmur or history of fainting. Patient denies any chest pain or pain radiating to her upper extremities. Patient denies any shortness of breath, difficulty breathing at night, cough or hemoptysis. Patient denies any swelling in the lower legs. Patient denies any nausea vomiting, vomiting of blood, or coffee ground material in the vomitus. Patient denies any stomach pain. Patient states has had normal bowel movements no significant constipation or diarrhea. Patient denies any dysuria, hematuria or significant nocturia. Patient denies any problems walking, swelling in the joints or loss of balance. Patient denies any skin changes, loss of hair or loss of weight. Patient denies any excessive worrying or anxiety or significant depression. Patient denies any problems with insomnia. Patient denies excessive thirst, polyuria, polydipsia. Patient denies any swollen glands, patient denies easy bruising or easy bleeding. Patient denies any recent infections, allergies or URI. Patient "s visual fields have not changed significantly in recent time.   PHYSICAL EXAM: BP (!) 147/92 (BP Location: Left Wrist, Patient Position: Sitting, Cuff Size: Small)   Pulse 79   Temp (!) 96.8 F (36 C) (Tympanic)   Resp 16   Wt 178 lb 4.8 oz (80.9 kg)   BMI 31.09 kg/m  Left breast has a horizontal incision which is well-healed.  No dominant mass or nodularity is noted in either breast in 2 positions examined.  No axillary or supraclavicular adenopathy is appreciated.  Well-developed well-nourished patient in NAD. HEENT reveals PERLA, EOMI, discs not visualized.  Oral cavity is clear. No oral mucosal lesions are identified. Neck is clear without evidence of cervical or supraclavicular adenopathy. Lungs are clear to A&P.  Cardiac examination is essentially unremarkable with regular rate and rhythm without murmur rub or thrill. Abdomen is benign with no organomegaly or masses noted. Motor sensory and DTR levels are equal and symmetric in the upper and lower extremities. Cranial nerves II through XII are grossly intact. Proprioception is intact. No peripheral adenopathy or edema is identified. No motor or sensory levels are noted. Crude visual fields are within normal range.  LABORATORY DATA: Pathology report reviewed    RADIOLOGY RESULTS: Mammogram ultrasound and MRI scans reviewed   IMPRESSION: Stage I ER/PR positive HER-2 negative invasive mammary carcinoma the left breast status post wide local excision and sentinel node biopsy with positive unifocal anterior margin in 79 year old female  PLAN: I have discussed the case with surgery.  I feel I would treat this patient with hypofractionated radiation therapy to her left breast over 3 weeks.  Would also boost her scar another 2000 cGy using electron beam based on the anterior positive margin.  I believe this can adequately sterilize any remaining tumor even  with positive margin.  There is a slight increased risk of local regional recurrence which I have informed the patient although this is with close screening will not affect her overall survival.  She understands if she does get a recurrence she would have to undergo mastectomy.  Patient comprehends my treatment plan well I have personally set up and ordered 6 CT simulation for next week.  I have contacted medical oncology to see if Oncotype DX was performed or other molecular studies although based on the size of the tumor and patient's age doubt systemic chemotherapy will be offered.  I would like to take this opportunity to thank you for allowing me to participate in the care of your patient.Noreene Filbert, MD

## 2019-09-01 NOTE — Progress Notes (Signed)
This 79 year old Caucasian female presents today in follow-up at my request.  She met with Dr. Baruch Gouty and Dr. Janese Banks earlier in the day, and they both are all in agreement of not proceeding with additional excision, determining that the added benefit and risks diminishment is not worth the additional operation. Latoya Stevenson is relieved, and happy to proceed on with her radiation treatment.  I will be glad to see her back in 6 months, or as needed.

## 2019-09-01 NOTE — Patient Instructions (Signed)
Please call our office if you have questions or concerns.   

## 2019-09-05 NOTE — Progress Notes (Signed)
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  She then noticed possible nipple discharge from her right breast which prompted a diagnostic right breast mammogram which was essentially unremarkable.  MRI was recommended and patient underwent a bilateral MRI which did not show any concerning findings in the right breast where she had the nipple discharge but showed an enhancing mass in the inner left breast measuring 0.9 x 0.6 x 0.7 cm which was suspicious and biopsy. Biopsy was consistent with invasive mammary carcinoma grade 2 6 mm ER greater than 90% positive PR greater than 90% positive and HER-2 negative.   Family history significant for breast cancer in her maternal grandmother at the age of 81.  Sister had breast cancer in her 94s.  Another sister had pancreatic cancer at 75.  Brother with pancreatic cancer.  Another brother with prostate and colon cancer.  Genetic testing negative  Final pathology showed 7 mm grade 2 invasive mammary carcinoma with positive unifocal anterior margin.  1 sentinel lymph node negative for malignancy.  PT1BPN0 tumor was greater than 90% ER positive PR positive and HER-2 negative   Interval history-patient is doing well post surgery.  Denies any complaints at this time  ECOG PS- 1 Pain scale- 0  Review of  systems- Review of Systems  Constitutional: Negative for chills, fever, malaise/fatigue and weight loss.  HENT: Negative for congestion, ear discharge and nosebleeds.   Eyes: Negative for blurred vision.  Respiratory: Negative for cough, hemoptysis, sputum production, shortness of breath and wheezing.   Cardiovascular: Negative for chest pain, palpitations, orthopnea and claudication.  Gastrointestinal: Negative for abdominal pain, blood in stool, constipation, diarrhea, heartburn, melena, nausea and vomiting.  Genitourinary: Negative for dysuria, flank pain, frequency, hematuria and urgency.  Musculoskeletal: Negative for back pain, joint pain and myalgias.  Skin: Negative for rash.  Neurological: Negative for dizziness, tingling, focal weakness, seizures, weakness and headaches.  Endo/Heme/Allergies: Does not bruise/bleed easily.  Psychiatric/Behavioral: Negative for depression and suicidal ideas. The patient does not have insomnia.     Allergies  Allergen Reactions   Flagyl [Metronidazole Hcl] Swelling    Swelling in face, mouth, throat, palms of feet and hands.   Furosemide Itching    Made side of mouth irritated and puffy.  The rim inside her lip swelled up   Macrodantin [Nitrofurantoin Macrocrystal] Anaphylaxis, Hives and Swelling   Penicillins Anaphylaxis and Swelling    PATIENT HAD A PCN REACTION WITH IMMEDIATE RASH, FACIAL/TONGUE/THROAT SWELLING, SOB, OR LIGHTHEADEDNESS WITH HYPOTENSION:  #  #  #  YES  #  #  #   Has patient had a PCN reaction causing severe rash involving mucus membranes or skin necrosis: No Has patient had a PCN reaction that required hospitalization: No Has patient had a PCN reaction occurring within the last 10 years: No If all of the above answers are "NO", then may proceed with Cephalosporin use.    Celebrex [  Celecoxib] Swelling    Swelling in joints.  Did not agree with her. Did not help her either   Other     Pt says it was recently discovered  she allergic to an ingredient in local anesthesia but she can't recall the name. (epinephrine and the additive used for the biopsy. Gets very nervous. Feels internally shaking. 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 (2018 and 2021)     Past Medical History:  Diagnosis Date   Allergic rhinitis    Anemia    low iron   Anxiety    Arthritis 07/2019   osteoporosis   Asthma without status asthmaticus    seasonal, not often   Bilateral sciatica    Breast cancer ([place]) 07/2019   invasive mammary cancer , grade 2   Bronchitis    Cancer (HCC)     follicular variant papillary thyroid cancer   Chronic back pain    unspecified   Chronic kidney disease    ckd stage 3b   Colon polyp    adenomatous   DDD (degenerative disc disease), lumbar    DJD (degenerative joint disease)    Dysphagia    Dyspnea    Environmental allergies    Family history of breast cancer    Family history of colon cancer    Family history of ovarian cancer    Family history of pancreatic cancer    Family history of prostate cancer    Family history of thyroid cancer    GERD (gastroesophageal reflux disease)    Headache    migraines as a young woman   History of recurrent UTIs    Hyperlipidemia    Hypertension    Hypothyroidism    IBS (irritable bowel syndrome)    Lumbar disc disease    Mold exposure 2002   hx of    Osteoporosis, post-menopausal    Pneumonia 1978   Restless legs    Scoliosis    Spinal stenosis    Thyroid nodule    aspirated in 1997 and 2003 which were negative   Ulcer    Varicose veins of left lower extremity    Vertigo    Wears dentures      Past Surgical History:  Procedure Laterality Date   ABDOMINAL HYSTERECTOMY  1977   Dr. Tito Dine FRACTURE SURGERY Left 2003   arch of foot.  no metal. per patient, she did not have surgery for this fracture. healed on its own   ANTERIOR LAT LUMBAR FUSION Left 09/09/2016    Procedure: Left Lumbar two-three Anterolateral lumbar interbody fusion with lateral plate;  Surgeon: Erline Levine, MD;  Location: [facility];  Service: Neurosurgery;  Laterality: Left;  Left L2-3 Anterolateral lumbar interbody fusion with lateral plate   APPENDECTOMY     BACK SURGERY  2013, 2016, 2018   lumbar lam, fusion, discectomy.titanium   BIOPSY THYROID     x2  thyroid nodule ; Dr. Sharlet Salina and one by Dr. Sabra Heck   BREAST BIOPSY Left 08/02/2019   coil clip-INVASIVE MAMMARY CARCINOMA   BREAST CYST ASPIRATION  1970's   BREAST LUMPECTOMY WITH RADIOFREQUENCY TAG IDENTIFICATION Left 0/73/7106   Procedure: BREAST LUMPECTOMY WITH RADIOFREQUENCY TAG IDENTIFICATION;  Surgeon: Ronny Bacon, MD;  Location: [facility];  Service: General;  Laterality: Left;   BREAST LUMPECTOMY WITH SENTINEL LYMPH NODE BIOPSY Left 08/12/2019   Procedure: BREAST LUMPECTOMY WITH SENTINEL LYMPH NODE BX;  Surgeon: Ronny Bacon, MD;  Location: [facility];  Service: General;  Laterality: Left;   broken arm Left 1994   wrist fracture surgery, left.  metal has been replaced.   CARDIOVASCULAR STRESS TEST     2010   COLONOSCOPY  2010   COLONOSCOPY  07/21/2013   PHX OF CP/REPEAT 33YRS/[place]   ESOPHAGOGASTRODUODENOSCOPY (EGD) WITH PROPOFOL N/A 09/22/2018   Procedure: ESOPHAGOGASTRODUODENOSCOPY (EGD) WITH PROPOFOL;  Surgeon: Toledo, Benay Pike, MD;  Location: [facility];  Service: Gastroenterology;  Laterality: N/A;  Patient to have Rapid COVID-19 day of procedure Kieth Brightly approved this)   EYE SURGERY Bilateral    cataract surgery with lens implant   LUMBAR LAMINECTOMY/DECOMPRESSION MICRODISCECTOMY  11/18/2011   Procedure: LUMBAR LAMINECTOMY/DECOMPRESSION MICRODISCECTOMY 2 LEVELS;  Surgeon: Erline Levine, MD;  Location: [place] [facility];  Service: Neurosurgery;  Laterality: N/A;  Lumbar Three-Four,  Lumbar Four-Five Decompressive Laminectomy   MALONEY DILATION N/A 09/22/2018   Procedure: MALONEY DILATION;  Surgeon: Toledo,  Benay Pike, MD;  Location: [facility];  Service: Gastroenterology;  Laterality: N/A;   OVARIAN CYST SURGERY     POSTERIOR FUSION LUMBAR SPINE  2016   TOTAL THYROIDECTOMY     TRANSTHORACIC ECHOCARDIOGRAM  2010   upper and lower GI  2010    Social History   Socioeconomic History   Marital status: Widowed    Spouse name: Not on file   Number of children: Not on file   Years of education: Not on file   Highest education level: Not on file  Occupational History   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   Social Determinants of Health   Financial Resource Strain:    Difficulty of Paying Living Expenses:   Food Insecurity:    Worried About Charity fundraiser in the Last Year:    Arboriculturist in the Last Year:   Transportation Needs:    Film/video editor (Medical):    Lack of Transportation (Non-Medical):   Physical Activity:    Days of Exercise per Week:    Minutes of Exercise per Session:   Stress:    Feeling of Stress :   Social Connections:    Frequency of Communication with Friends and Family:    Frequency of Social Gatherings with Friends and Family:    Attends Religious Services:    Active Member of Clubs or Organizations:    Attends Music therapist:    Marital Status:   Intimate Partner Violence:    Fear of Current or Ex-Partner:    Emotionally Abused:    Physically Abused:    Sexually Abused:     Family History  Problem Relation Age of Onset   Breast cancer Mother 24   Thyroid cancer  Mother    Pancreatic cancer Brother    Pancreatic cancer Sister    Suicidality Father    Prostate cancer Brother    Colon cancer Brother    Breast cancer Sister        x 2 times   Heart attack Sister  Ovarian cancer Maternal Aunt    Breast cancer Paternal Aunt    Lung cancer Paternal Uncle    Breast cancer Other    Colon cancer Maternal Aunt    Pancreatic cancer Maternal Aunt      Current Outpatient Medications:    acetaminophen (TYLENOL) 500 MG tablet, Take 1,000 mg by mouth every 6 (six) hours as needed., Disp: , Rfl:    acidophilus (RISAQUAD) CAPS capsule, Take 1 capsule by mouth daily., Disp: , Rfl:    amLODipine (NORVASC) 5 MG tablet, Take 5 mg by mouth daily. Takes in the evening, Disp: , Rfl:    atorvastatin (LIPITOR) 40 MG tablet, Take 40 mg by mouth at bedtime. , Disp: , Rfl:    Calcium Carbonate-Vitamin D (CALCIUM 600+D) 600-400 MG-UNIT tablet, Take 2 tablets by mouth daily., Disp: , Rfl:    chlorthalidone (HYGROTON) 25 MG tablet, Take 25 mg by mouth in the morning and at bedtime. , Disp: , Rfl: 1   cholecalciferol (VITAMIN D3) 25 MCG (1000 UNIT) tablet, Take 1,000 Units by mouth daily., Disp: , Rfl:    diphenhydrAMINE (SOMINEX) 25 MG tablet, Take 25 mg by mouth at bedtime. , Disp: , Rfl:    doxylamine, Sleep, (UNISOM) 25 MG tablet, Take 25 mg by mouth at bedtime., Disp: , Rfl:    fexofenadine (ALLEGRA) 180 MG tablet, Take 180 mg by mouth daily. , Disp: , Rfl:    fluticasone (FLONASE) 50 MCG/ACT nasal spray, Place 2 sprays into both nostrils daily as needed for allergies. , Disp: , Rfl:    GLUCOSAMINE-CHONDROITIN PO, Take 1 tablet by mouth daily. , Disp: , Rfl:    hydrochlorothiazide (HYDRODIURIL) 25 MG tablet, Take 25 mg by mouth 2 (two) times daily., Disp: , Rfl:    irbesartan (AVAPRO) 300 MG tablet, Take 300 mg by mouth daily. , Disp: , Rfl:    levothyroxine (SYNTHROID) 175 MCG tablet, Take 175 mcg by mouth every morning., Disp: , Rfl:     Multiple Vitamin (MULTIVITAMIN WITH MINERALS) TABS, Take 1 tablet by mouth daily., Disp: , Rfl:    nabumetone (RELAFEN) 500 MG tablet, Take 500 mg by mouth 2 (two) times daily. , Disp: , Rfl:    omeprazole (PRILOSEC) 40 MG capsule, Take 40 mg by mouth daily before breakfast. , Disp: , Rfl:    vitamin E (VITAMIN E) 180 MG (400 UNITS) capsule, Take 400 Units by mouth daily., Disp: , Rfl:   Physical exam:  Vitals:   09/01/19 1212  BP: (!) 147/92  Pulse: 79  Resp: 18  Weight: 178 lb (80.7 kg)   Physical Exam Constitutional:      General: She is not in acute distress. Cardiovascular:     Rate and Rhythm: Normal rate and regular rhythm.     Heart sounds: Normal heart sounds.  Pulmonary:     Effort: Pulmonary effort is normal.     Breath sounds: Normal breath sounds.  Abdominal:     General: Bowel sounds are normal.     Palpations: Abdomen is soft.  Skin:    General: Skin is warm and dry.  Neurological:     Mental Status: She is alert and oriented to person, place, and time.      CMP Latest Ref Rng & Units 11/27/2016  Glucose 65 - 99 mg/dL 103(H)  BUN 6 - 20 mg/dL 18  Creatinine 0.44 - 1.00 mg/dL 0.90  Sodium 135 - 145 mmol/L 136  Potassium 3.5 - 5.1 mmol/L 4.0  Chloride 101 - 111 mmol/L 106  CO2 22 - 32 mmol/L 23  Calcium 8.9 - 10.3 mg/dL 9.0   CBC Latest Ref Rng & Units 11/27/2016  WBC 4.0 - 10.5 K/uL 7.2  Hemoglobin 12.0 - 15.0 g/dL 11.1(L)  Hematocrit 36 - 46 % 35.6(L)  Platelets 150 - 400 K/uL 224    No images are attached to the encounter.  NM SENTINEL NODE INJECTION  Result Date: 08/12/2019 CLINICAL DATA:  Left breast cancer. EXAM: NUCLEAR MEDICINE BREAST LYMPHOSCINTIGRAPHY TECHNIQUE: Intradermal injection of radiopharmaceutical was performed at the 12 o'clock, 3 o'clock, 6 o'clock, and 9 o'clock positions around the left nipple. The patient was then sent to the operating room where the sentinel node(s) were identified and removed by the surgeon.  RADIOPHARMACEUTICALS:  Total of 0.9 mCi Millipore-filtered Technetium-41msulfur colloid, injected in four aliquots. IMPRESSION: Uncomplicated intradermal injection of a total of 0.9 mCi Technetium-948mulfur colloid for purposes of sentinel node identification. Electronically Signed   By: ThMarcello MooresRegister   On: 08/12/2019 08:32   MM LT RADIO FREQUENCY TAG LOC MAMMO GUIDE  Result Date: 08/10/2019 CLINICAL DATA:  Localization left breast cancer EXAM: NEEDLE LOCALIZATION OF THE LEFT BREAST WITH MAMMO GUIDANCE COMPARISON:  Previous exams. FINDINGS: Patient presents for needle localization prior to surgery. I met with the patient and we discussed the procedure of needle localization including benefits and alternatives. We discussed the high likelihood of a successful procedure. We discussed the risks of the procedure, including infection, bleeding, tissue injury, and further surgery. Informed, written consent was given. The usual time-out protocol was performed immediately prior to the procedure. Using mammographic guidance, sterile technique, 1% lidocaine and a 5 cm localization device, the biopsy clip was localized using a medial approach. The images were marked for the surgeon. IMPRESSION: Radar reflector localization of the left breast. No apparent complications. Electronically Signed   By: DaDorise BullionII M.D   On: 08/10/2019 16:11     Assessment and plan- Patient is a [age].o. 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  1 sentinel lymph node was negative for malignancy.  She therefore has stage I breast cancer.  She did have a positive anterior  unifocal margin.  Dr. CrDonella Stadeas seen the patient and plan is to boost the scar with 2000 cGy for the anterior positive margin.  Reexcision surgery is not being planned.  From my standpoint given her age and the fact that she had a small ER/PR positive HER-2 negative breast cancer, I did not recommend undergoing Oncotype testing even though she just about meets criteria given the 7 mm grade 2 tumor.  I recommend 5 years of hormone therapy for her. Given that she is postmenopausal I would favor 5 years of adjuvant hormone therapy with aromatase inhibitor. I discussed the risks and benefits of Arimidex including all but not limited to fatigue, hypercholesterolemia, hot flashes, arthralgias and worsening bone health.  Patient will also need to be on calcium 1200 mg along with vitamin D 800 international units.  We will obtain a baseline bone density scan written information about Arimidex given to the patient. I would like her to finish radiation therapy and  start hormone therapy thereafter. Patient verbalized understanding and agrees to proceed.  Treatment will be given with a curative intent  I will see her back in 3.5 months upon completion of radiation treatment and after she has been on Arimidex for about 6 weeks to see how she is tolerating treatment    Visit Diagnosis 1. Malignant neoplasm of 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, unspecified estrogen receptor status ([place])   2. Goals of care, counseling/discussion      Dr. Randa Evens, MD, MPH [facility] [phone] 09/05/2019 1:06 PM

## 2019-09-07 ENCOUNTER — Ambulatory Visit
Admission: RE | Admit: 2019-09-07 | Discharge: 2019-09-07 | Disposition: A | Discharge status: Home / Self Care | Payer: Medicare Other | Source: Ambulatory Visit | Attending: Radiation Oncology | Admitting: Radiation Oncology

## 2019-09-07 DIAGNOSIS — C50012 Malignant neoplasm of nipple and areola, left female breast: Secondary | ICD-10-CM | POA: Insufficient documentation

## 2019-09-09 ENCOUNTER — Other Ambulatory Visit: Payer: Self-pay | Admitting: *Deleted

## 2019-09-09 DIAGNOSIS — C50012 Malignant neoplasm of nipple and areola, left female breast: Secondary | ICD-10-CM

## 2019-09-12 DIAGNOSIS — C50012 Malignant neoplasm of nipple and areola, left female breast: Secondary | ICD-10-CM | POA: Diagnosis not present

## 2019-09-14 ENCOUNTER — Ambulatory Visit: Admission: RE | Admit: 2019-09-14 | Payer: Medicare Other | Source: Ambulatory Visit

## 2019-09-14 DIAGNOSIS — C50012 Malignant neoplasm of nipple and areola, left female breast: Secondary | ICD-10-CM | POA: Diagnosis not present

## 2019-09-15 ENCOUNTER — Ambulatory Visit
Admission: RE | Admit: 2019-09-15 | Discharge: 2019-09-15 | Disposition: A | Discharge status: Home / Self Care | Payer: Medicare Other | Source: Ambulatory Visit | Attending: Radiation Oncology | Admitting: Radiation Oncology

## 2019-09-15 ENCOUNTER — Ambulatory Visit: Payer: Medicare Other

## 2019-09-15 DIAGNOSIS — C50012 Malignant neoplasm of nipple and areola, left female breast: Secondary | ICD-10-CM | POA: Diagnosis present

## 2019-09-16 ENCOUNTER — Ambulatory Visit
Admission: RE | Admit: 2019-09-16 | Discharge: 2019-09-16 | Disposition: A | Discharge status: Home / Self Care | Payer: Medicare Other | Source: Ambulatory Visit | Attending: Radiation Oncology | Admitting: Radiation Oncology

## 2019-09-16 ENCOUNTER — Ambulatory Visit: Payer: Medicare Other

## 2019-09-16 DIAGNOSIS — C50012 Malignant neoplasm of nipple and areola, left female breast: Secondary | ICD-10-CM | POA: Diagnosis not present

## 2019-09-20 ENCOUNTER — Ambulatory Visit
Admission: RE | Admit: 2019-09-20 | Discharge: 2019-09-20 | Disposition: A | Discharge status: Home / Self Care | Payer: Medicare Other | Source: Ambulatory Visit | Attending: Radiation Oncology | Admitting: Radiation Oncology

## 2019-09-20 DIAGNOSIS — C50012 Malignant neoplasm of nipple and areola, left female breast: Secondary | ICD-10-CM | POA: Diagnosis not present

## 2019-09-21 ENCOUNTER — Ambulatory Visit
Admission: RE | Admit: 2019-09-21 | Discharge: 2019-09-21 | Disposition: A | Discharge status: Home / Self Care | Payer: Medicare Other | Source: Ambulatory Visit | Attending: Radiation Oncology | Admitting: Radiation Oncology

## 2019-09-21 DIAGNOSIS — C50012 Malignant neoplasm of nipple and areola, left female breast: Secondary | ICD-10-CM | POA: Diagnosis not present

## 2019-09-22 ENCOUNTER — Ambulatory Visit
Admission: RE | Admit: 2019-09-22 | Discharge: 2019-09-22 | Disposition: A | Discharge status: Home / Self Care | Payer: Medicare Other | Source: Ambulatory Visit | Attending: Radiation Oncology | Admitting: Radiation Oncology

## 2019-09-22 DIAGNOSIS — C50012 Malignant neoplasm of nipple and areola, left female breast: Secondary | ICD-10-CM | POA: Diagnosis not present

## 2019-09-23 ENCOUNTER — Ambulatory Visit
Admission: RE | Admit: 2019-09-23 | Discharge: 2019-09-23 | Disposition: A | Discharge status: Home / Self Care | Payer: Medicare Other | Source: Ambulatory Visit | Attending: Radiation Oncology | Admitting: Radiation Oncology

## 2019-09-23 DIAGNOSIS — C50012 Malignant neoplasm of nipple and areola, left female breast: Secondary | ICD-10-CM | POA: Diagnosis not present

## 2019-09-26 ENCOUNTER — Inpatient Hospital Stay: Payer: Medicare Other | Attending: Oncology

## 2019-09-26 ENCOUNTER — Ambulatory Visit
Admission: RE | Admit: 2019-09-26 | Discharge: 2019-09-26 | Disposition: A | Discharge status: Home / Self Care | Payer: Medicare Other | Source: Ambulatory Visit | Attending: Radiation Oncology | Admitting: Radiation Oncology

## 2019-09-26 ENCOUNTER — Other Ambulatory Visit: Payer: Self-pay

## 2019-09-26 DIAGNOSIS — C50312 Malignant neoplasm of 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: Secondary | ICD-10-CM | POA: Insufficient documentation

## 2019-09-26 DIAGNOSIS — Z170 Estrogen receptor positive status [ER+]: Secondary | ICD-10-CM | POA: Diagnosis not present

## 2019-09-26 DIAGNOSIS — C50012 Malignant neoplasm of nipple and areola, left female breast: Secondary | ICD-10-CM | POA: Diagnosis not present

## 2019-09-26 DIAGNOSIS — Z79811 Long term (current) use of aromatase inhibitors: Secondary | ICD-10-CM | POA: Diagnosis not present

## 2019-09-26 LAB — CBC
HCT: 38.5 % (ref 36.0–46.0)
Hemoglobin: 12.7 g/dL (ref 12.0–15.0)
MCH: 29.2 pg (ref 26.0–34.0)
MCHC: 33 g/dL (ref 30.0–36.0)
MCV: 88.5 fL (ref 80.0–100.0)
Platelets: 160 10*3/uL (ref 150–400)
RBC: 4.35 MIL/uL (ref 3.87–5.11)
RDW: 13.8 % (ref 11.5–15.5)
WBC: 5.5 10*3/uL (ref 4.0–10.5)
nRBC: 0 % (ref 0.0–0.2)

## 2019-09-27 ENCOUNTER — Ambulatory Visit
Admission: RE | Admit: 2019-09-27 | Discharge: 2019-09-27 | Disposition: A | Discharge status: Home / Self Care | Payer: Medicare Other | Source: Ambulatory Visit | Attending: Radiation Oncology | Admitting: Radiation Oncology

## 2019-09-27 DIAGNOSIS — C50012 Malignant neoplasm of nipple and areola, left female breast: Secondary | ICD-10-CM | POA: Diagnosis not present

## 2019-09-28 ENCOUNTER — Ambulatory Visit
Admission: RE | Admit: 2019-09-28 | Discharge: 2019-09-28 | Disposition: A | Discharge status: Home / Self Care | Payer: Medicare Other | Source: Ambulatory Visit | Attending: Radiation Oncology | Admitting: Radiation Oncology

## 2019-09-28 DIAGNOSIS — C50012 Malignant neoplasm of nipple and areola, left female breast: Secondary | ICD-10-CM | POA: Diagnosis not present

## 2019-09-29 ENCOUNTER — Ambulatory Visit
Admission: RE | Admit: 2019-09-29 | Discharge: 2019-09-29 | Disposition: A | Discharge status: Home / Self Care | Payer: Medicare Other | Source: Ambulatory Visit | Attending: Radiation Oncology | Admitting: Radiation Oncology

## 2019-09-29 DIAGNOSIS — C50012 Malignant neoplasm of nipple and areola, left female breast: Secondary | ICD-10-CM | POA: Diagnosis not present

## 2019-09-30 ENCOUNTER — Ambulatory Visit
Admission: RE | Admit: 2019-09-30 | Discharge: 2019-09-30 | Disposition: A | Discharge status: Home / Self Care | Payer: Medicare Other | Source: Ambulatory Visit | Attending: Radiation Oncology | Admitting: Radiation Oncology

## 2019-09-30 DIAGNOSIS — C50012 Malignant neoplasm of nipple and areola, left female breast: Secondary | ICD-10-CM | POA: Diagnosis not present

## 2019-10-03 ENCOUNTER — Ambulatory Visit
Admission: RE | Admit: 2019-10-03 | Discharge: 2019-10-03 | Disposition: A | Discharge status: Home / Self Care | Payer: Medicare Other | Source: Ambulatory Visit | Attending: Radiation Oncology | Admitting: Radiation Oncology

## 2019-10-03 DIAGNOSIS — C50012 Malignant neoplasm of nipple and areola, left female breast: Secondary | ICD-10-CM | POA: Diagnosis not present

## 2019-10-04 ENCOUNTER — Ambulatory Visit
Admission: RE | Admit: 2019-10-04 | Discharge: 2019-10-04 | Disposition: A | Discharge status: Home / Self Care | Payer: Medicare Other | Source: Ambulatory Visit | Attending: Radiation Oncology | Admitting: Radiation Oncology

## 2019-10-04 DIAGNOSIS — C50012 Malignant neoplasm of nipple and areola, left female breast: Secondary | ICD-10-CM | POA: Diagnosis not present

## 2019-10-05 ENCOUNTER — Ambulatory Visit
Admission: RE | Admit: 2019-10-05 | Discharge: 2019-10-05 | Disposition: A | Discharge status: Home / Self Care | Payer: Medicare Other | Source: Ambulatory Visit | Attending: Radiation Oncology | Admitting: Radiation Oncology

## 2019-10-05 DIAGNOSIS — C50012 Malignant neoplasm of nipple and areola, left female breast: Secondary | ICD-10-CM | POA: Diagnosis not present

## 2019-10-06 ENCOUNTER — Ambulatory Visit
Admission: RE | Admit: 2019-10-06 | Discharge: 2019-10-06 | Disposition: A | Discharge status: Home / Self Care | Payer: Medicare Other | Source: Ambulatory Visit | Attending: Radiation Oncology | Admitting: Radiation Oncology

## 2019-10-06 DIAGNOSIS — C50012 Malignant neoplasm of nipple and areola, left female breast: Secondary | ICD-10-CM | POA: Diagnosis not present

## 2019-10-07 ENCOUNTER — Ambulatory Visit
Admission: RE | Admit: 2019-10-07 | Discharge: 2019-10-07 | Disposition: A | Discharge status: Home / Self Care | Payer: Medicare Other | Source: Ambulatory Visit | Attending: Radiation Oncology | Admitting: Radiation Oncology

## 2019-10-07 DIAGNOSIS — C50012 Malignant neoplasm of nipple and areola, left female breast: Secondary | ICD-10-CM | POA: Diagnosis not present

## 2019-10-10 ENCOUNTER — Inpatient Hospital Stay: Payer: Medicare Other

## 2019-10-10 ENCOUNTER — Other Ambulatory Visit: Payer: Self-pay

## 2019-10-10 ENCOUNTER — Ambulatory Visit
Admission: RE | Admit: 2019-10-10 | Discharge: 2019-10-10 | Disposition: A | Discharge status: Home / Self Care | Payer: Medicare Other | Source: Ambulatory Visit | Attending: Radiation Oncology | Admitting: Radiation Oncology

## 2019-10-10 DIAGNOSIS — C50312 Malignant neoplasm of 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: Secondary | ICD-10-CM | POA: Diagnosis not present

## 2019-10-10 DIAGNOSIS — C50012 Malignant neoplasm of nipple and areola, left female breast: Secondary | ICD-10-CM | POA: Diagnosis not present

## 2019-10-10 LAB — CBC
HCT: 40.2 % (ref 36.0–46.0)
Hemoglobin: 13.2 g/dL (ref 12.0–15.0)
MCH: 29.3 pg (ref 26.0–34.0)
MCHC: 32.8 g/dL (ref 30.0–36.0)
MCV: 89.3 fL (ref 80.0–100.0)
Platelets: 162 10*3/uL (ref 150–400)
RBC: 4.5 MIL/uL (ref 3.87–5.11)
RDW: 13.9 % (ref 11.5–15.5)
WBC: 5.1 10*3/uL (ref 4.0–10.5)
nRBC: 0 % (ref 0.0–0.2)

## 2019-10-11 ENCOUNTER — Ambulatory Visit
Admission: RE | Admit: 2019-10-11 | Discharge: 2019-10-11 | Disposition: A | Discharge status: Home / Self Care | Payer: Medicare Other | Source: Ambulatory Visit | Attending: Radiation Oncology | Admitting: Radiation Oncology

## 2019-10-11 DIAGNOSIS — C50012 Malignant neoplasm of nipple and areola, left female breast: Secondary | ICD-10-CM | POA: Diagnosis not present

## 2019-10-12 ENCOUNTER — Ambulatory Visit
Admission: RE | Admit: 2019-10-12 | Discharge: 2019-10-12 | Disposition: A | Discharge status: Home / Self Care | Payer: Medicare Other | Source: Ambulatory Visit | Attending: Radiation Oncology | Admitting: Radiation Oncology

## 2019-10-12 DIAGNOSIS — C50012 Malignant neoplasm of nipple and areola, left female breast: Secondary | ICD-10-CM | POA: Diagnosis not present

## 2019-10-13 ENCOUNTER — Ambulatory Visit
Admission: RE | Admit: 2019-10-13 | Discharge: 2019-10-13 | Disposition: A | Discharge status: Home / Self Care | Payer: Medicare Other | Source: Ambulatory Visit | Attending: Radiation Oncology | Admitting: Radiation Oncology

## 2019-10-13 DIAGNOSIS — C50012 Malignant neoplasm of nipple and areola, left female breast: Secondary | ICD-10-CM | POA: Diagnosis not present

## 2019-10-14 ENCOUNTER — Ambulatory Visit
Admission: RE | Admit: 2019-10-14 | Discharge: 2019-10-14 | Disposition: A | Discharge status: Home / Self Care | Payer: Medicare Other | Source: Ambulatory Visit | Attending: Radiation Oncology | Admitting: Radiation Oncology

## 2019-10-14 DIAGNOSIS — C50012 Malignant neoplasm of nipple and areola, left female breast: Secondary | ICD-10-CM | POA: Diagnosis not present

## 2019-10-17 ENCOUNTER — Ambulatory Visit
Admission: RE | Admit: 2019-10-17 | Discharge: 2019-10-17 | Disposition: A | Discharge status: Home / Self Care | Payer: Medicare Other | Source: Ambulatory Visit | Attending: Radiation Oncology | Admitting: Radiation Oncology

## 2019-10-17 DIAGNOSIS — C50012 Malignant neoplasm of nipple and areola, left female breast: Secondary | ICD-10-CM | POA: Insufficient documentation

## 2019-10-18 ENCOUNTER — Ambulatory Visit
Admission: RE | Admit: 2019-10-18 | Discharge: 2019-10-18 | Disposition: A | Discharge status: Home / Self Care | Payer: Medicare Other | Source: Ambulatory Visit | Attending: Radiation Oncology | Admitting: Radiation Oncology

## 2019-10-18 DIAGNOSIS — C50012 Malignant neoplasm of nipple and areola, left female breast: Secondary | ICD-10-CM | POA: Diagnosis not present

## 2019-10-19 ENCOUNTER — Ambulatory Visit
Admission: RE | Admit: 2019-10-19 | Discharge: 2019-10-19 | Disposition: A | Discharge status: Home / Self Care | Payer: Medicare Other | Source: Ambulatory Visit | Attending: Radiation Oncology | Admitting: Radiation Oncology

## 2019-10-19 DIAGNOSIS — C50012 Malignant neoplasm of nipple and areola, left female breast: Secondary | ICD-10-CM | POA: Diagnosis not present

## 2019-10-20 ENCOUNTER — Ambulatory Visit
Admission: RE | Admit: 2019-10-20 | Discharge: 2019-10-20 | Disposition: A | Discharge status: Home / Self Care | Payer: Medicare Other | Source: Ambulatory Visit | Attending: Radiation Oncology | Admitting: Radiation Oncology

## 2019-10-20 DIAGNOSIS — C50012 Malignant neoplasm of nipple and areola, left female breast: Secondary | ICD-10-CM | POA: Diagnosis not present

## 2019-10-21 ENCOUNTER — Ambulatory Visit
Admission: RE | Admit: 2019-10-21 | Discharge: 2019-10-21 | Disposition: A | Discharge status: Home / Self Care | Payer: Medicare Other | Source: Ambulatory Visit | Attending: Radiation Oncology | Admitting: Radiation Oncology

## 2019-10-21 ENCOUNTER — Other Ambulatory Visit: Payer: Self-pay | Admitting: *Deleted

## 2019-10-21 ENCOUNTER — Telehealth: Payer: Self-pay | Admitting: *Deleted

## 2019-10-21 DIAGNOSIS — C50012 Malignant neoplasm of nipple and areola, left female breast: Secondary | ICD-10-CM | POA: Diagnosis not present

## 2019-10-21 MED ORDER — ANASTROZOLE 1 MG PO TABS
1.0000 mg | ORAL_TABLET | Freq: Every day | ORAL | 1 refills | Status: DC
Start: 2019-10-21 — End: 2019-12-19

## 2019-10-21 NOTE — Telephone Encounter (Signed)
Ana sent a secure chat that pt wanted to know when to start the AI med and send rx to her pharmacy. Ana said that the pt says she will be on tamoxifen. I reviewed dr Elroy Channel note and it said because she is post menopause will start with arimidex. I called pt and spoke to her and she said she thought chrystal had told her that. I explained to her that since she is post menopause -generally dr Janese Banks starts pt on anastrozole. I told her to take one a day and she can take it any time she wants but when she figures out what is the best time of day then she should stick to that schedule so that she be sure to take it that time every day. Also went over poss. Side effects and she already has achy joints but not hot flashes yet but has already dealt with them during menopause. I sent rx into her pharmacy and she has app with Janese Banks in Oct. And she knows to call if she has issues with med.

## 2019-10-24 ENCOUNTER — Ambulatory Visit: Payer: Medicare Other

## 2019-10-25 ENCOUNTER — Ambulatory Visit: Payer: Medicare Other

## 2019-11-28 DIAGNOSIS — M8588 Other specified disorders of bone density and structure, other site: Secondary | ICD-10-CM | POA: Diagnosis not present

## 2019-11-28 LAB — HM DEXA SCAN

## 2019-12-02 ENCOUNTER — Other Ambulatory Visit: Payer: Self-pay

## 2019-12-02 ENCOUNTER — Encounter: Payer: Self-pay | Admitting: Radiation Oncology

## 2019-12-02 ENCOUNTER — Ambulatory Visit
Admission: RE | Admit: 2019-12-02 | Discharge: 2019-12-02 | Disposition: A | Discharge status: Home / Self Care | Payer: Medicare HMO | Source: Ambulatory Visit | Attending: Radiation Oncology | Admitting: Radiation Oncology

## 2019-12-02 VITALS — BP 131/76 | HR 73 | Temp 96.1°F | Wt 176.0 lb

## 2019-12-02 DIAGNOSIS — Z79811 Long term (current) use of aromatase inhibitors: Secondary | ICD-10-CM | POA: Diagnosis not present

## 2019-12-02 DIAGNOSIS — Z170 Estrogen receptor positive status [ER+]: Secondary | ICD-10-CM | POA: Insufficient documentation

## 2019-12-02 DIAGNOSIS — Z923 Personal history of irradiation: Secondary | ICD-10-CM | POA: Insufficient documentation

## 2019-12-02 DIAGNOSIS — C50012 Malignant neoplasm of nipple and areola, left female breast: Secondary | ICD-10-CM | POA: Insufficient documentation

## 2019-12-02 NOTE — Progress Notes (Signed)
Radiation Oncology Follow up Note  Name: Latoya Stevenson   Date:   12/02/2019 MRN:  [id number] DOB: 01-22-1941    This 79 y.o. female presents to the clinic today for 1 month follow-up status post whole breast radiation to her left breast for stage I (T1b N0 M0) invasive mammary carcinoma ER/PR positive.  REFERRING PROVIDER: Baxter Hire, MD  HPI: Patient is a 79 year old female now at 1 month having completed whole breast radiation to her left breast for stage I ER/PR positive invasive mammary carcinoma.  She had a unifocal positive margin status post wide local with local excision and sentinel node biopsy.  She is seen today in routine follow-up and is doing well..  She specifically denies breast tenderness cough or bone pain.  She has been started on Arimidex tolerating it well without side effect.  COMPLICATIONS OF TREATMENT: none  FOLLOW UP COMPLIANCE: keeps appointments   PHYSICAL EXAM:  BP 131/76 (BP Location: Left Arm, Patient Position: Sitting, Cuff Size: Normal)   Pulse 73   Temp (!) 96.1 F (35.6 C) (Tympanic)   Wt 176 lb (79.8 kg)   BMI 31.18 kg/m  Lungs are clear to A&P cardiac examination essentially unremarkable with regular rate and rhythm. No dominant mass or nodularity is noted in either breast in 2 positions examined. Incision is well-healed. No axillary or supraclavicular adenopathy is appreciated. Cosmetic result is excellent.  Well-developed well-nourished patient in NAD. HEENT reveals PERLA, EOMI, discs not visualized.  Oral cavity is clear. No oral mucosal lesions are identified. Neck is clear without evidence of cervical or supraclavicular adenopathy. Lungs are clear to A&P. Cardiac examination is essentially unremarkable with regular rate and rhythm without murmur rub or thrill. Abdomen is benign with no organomegaly or masses noted. Motor sensory and DTR levels are equal and symmetric in the upper and lower extremities. Cranial nerves II through XII are grossly  intact. Proprioception is intact. No peripheral adenopathy or edema is identified. No motor or sensory levels are noted. Crude visual fields are within normal range.  RADIOLOGY RESULTS: No current films to review  PLAN: Present time she is doing well 1 month out with no evidence of disease very low side effect profile.  And pleased with her overall progress.  Of asked to see her back in 4 to 5 months for follow-up.  She continues on Arimidex without side effect.  Patient knows to call with any concerns.  I would like to take this opportunity to thank you for allowing me to participate in the care of your patient.Noreene Filbert, MD

## 2019-12-09 ENCOUNTER — Other Ambulatory Visit: Payer: Self-pay

## 2019-12-09 ENCOUNTER — Observation Stay: Payer: Medicare HMO | Admitting: Certified Registered Nurse Anesthetist

## 2019-12-09 ENCOUNTER — Emergency Department: Payer: Medicare HMO

## 2019-12-09 ENCOUNTER — Observation Stay
Admission: EM | Admit: 2019-12-09 | Discharge: 2019-12-10 | Disposition: A | Discharge status: Home / Self Care | Payer: Medicare HMO | Attending: Orthopedic Surgery | Admitting: Orthopedic Surgery

## 2019-12-09 ENCOUNTER — Encounter
Admission: EM | Disposition: A | Discharge status: Home / Self Care | Payer: Self-pay | Source: Home / Self Care | Attending: Emergency Medicine

## 2019-12-09 DIAGNOSIS — I129 Hypertensive chronic kidney disease with stage 1 through stage 4 chronic kidney disease, or unspecified chronic kidney disease: Secondary | ICD-10-CM | POA: Insufficient documentation

## 2019-12-09 DIAGNOSIS — E039 Hypothyroidism, unspecified: Secondary | ICD-10-CM | POA: Diagnosis not present

## 2019-12-09 DIAGNOSIS — Z79899 Other long term (current) drug therapy: Secondary | ICD-10-CM | POA: Insufficient documentation

## 2019-12-09 DIAGNOSIS — S81812A Laceration without foreign body, left lower leg, initial encounter: Secondary | ICD-10-CM | POA: Diagnosis not present

## 2019-12-09 DIAGNOSIS — N179 Acute kidney failure, unspecified: Secondary | ICD-10-CM | POA: Diagnosis not present

## 2019-12-09 DIAGNOSIS — S81811A Laceration without foreign body, right lower leg, initial encounter: Secondary | ICD-10-CM | POA: Insufficient documentation

## 2019-12-09 DIAGNOSIS — C50312 Malignant neoplasm of 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: Secondary | ICD-10-CM

## 2019-12-09 DIAGNOSIS — S80852A Superficial foreign body, left lower leg, initial encounter: Secondary | ICD-10-CM

## 2019-12-09 DIAGNOSIS — S81822A Laceration with foreign body, left lower leg, initial encounter: Principal | ICD-10-CM | POA: Insufficient documentation

## 2019-12-09 DIAGNOSIS — R600 Localized edema: Secondary | ICD-10-CM | POA: Diagnosis not present

## 2019-12-09 DIAGNOSIS — N1832 Chronic kidney disease, stage 3b: Secondary | ICD-10-CM | POA: Diagnosis not present

## 2019-12-09 DIAGNOSIS — W28XXXA Contact with powered lawn mower, initial encounter: Secondary | ICD-10-CM | POA: Diagnosis not present

## 2019-12-09 DIAGNOSIS — Z853 Personal history of malignant neoplasm of breast: Secondary | ICD-10-CM | POA: Diagnosis not present

## 2019-12-09 DIAGNOSIS — Z20822 Contact with and (suspected) exposure to covid-19: Secondary | ICD-10-CM | POA: Diagnosis not present

## 2019-12-09 DIAGNOSIS — R58 Hemorrhage, not elsewhere classified: Secondary | ICD-10-CM | POA: Diagnosis not present

## 2019-12-09 DIAGNOSIS — T07XXXA Unspecified multiple injuries, initial encounter: Secondary | ICD-10-CM | POA: Diagnosis not present

## 2019-12-09 DIAGNOSIS — S81821A Laceration with foreign body, right lower leg, initial encounter: Secondary | ICD-10-CM | POA: Diagnosis not present

## 2019-12-09 HISTORY — PX: INCISION AND DRAINAGE: SHX5863

## 2019-12-09 HISTORY — PX: FOREIGN BODY REMOVAL: SHX962

## 2019-12-09 HISTORY — PX: LACERATION REPAIR: SHX5284

## 2019-12-09 LAB — BASIC METABOLIC PANEL
Anion gap: 11 (ref 5–15)
BUN: 38 mg/dL — ABNORMAL HIGH (ref 8–23)
CO2: 25 mmol/L (ref 22–32)
Calcium: 9 mg/dL (ref 8.9–10.3)
Chloride: 105 mmol/L (ref 98–111)
Creatinine, Ser: 1.44 mg/dL — ABNORMAL HIGH (ref 0.44–1.00)
GFR calc Af Amer: 40 mL/min — ABNORMAL LOW (ref >60)
GFR calc non Af Amer: 35 mL/min — ABNORMAL LOW (ref >60)
Glucose, Bld: 100 mg/dL — ABNORMAL HIGH (ref 70–99)
Potassium: 3.9 mmol/L (ref 3.5–5.1)
Sodium: 141 mmol/L (ref 135–145)

## 2019-12-09 LAB — RESPIRATORY PANEL BY RT PCR (FLU A&B, COVID)
Influenza A by PCR: NEGATIVE
Influenza B by PCR: NEGATIVE
SARS Coronavirus 2 by RT PCR: NEGATIVE

## 2019-12-09 SURGERY — FOREIGN BODY REMOVAL ADULT
Anesthesia: General | Site: Leg Lower

## 2019-12-09 MED ORDER — ACETAMINOPHEN 500 MG PO TABS
1000.0000 mg | ORAL_TABLET | Freq: Once | ORAL | Status: AC
  Administered 2019-12-09: 1000 mg via ORAL
  Filled 2019-12-09: qty 2

## 2019-12-09 MED ORDER — CLINDAMYCIN PHOSPHATE 600 MG/50ML IV SOLN
600.0000 mg | Freq: Once | INTRAVENOUS | Status: AC
  Administered 2019-12-09: 600 mg via INTRAVENOUS
  Filled 2019-12-09: qty 50

## 2019-12-09 MED ORDER — METHOCARBAMOL 500 MG PO TABS: 500.0000 mg | ORAL_TABLET | Freq: Four times a day (QID) | ORAL | Status: DC | PRN

## 2019-12-09 MED ORDER — DIPHENHYDRAMINE HCL 25 MG PO CAPS: 25.0000 mg | ORAL_CAPSULE | Freq: Every day | ORAL | Status: DC

## 2019-12-09 MED ORDER — ONDANSETRON HCL 4 MG PO TABS: 4.0000 mg | ORAL_TABLET | Freq: Four times a day (QID) | ORAL | Status: DC | PRN

## 2019-12-09 MED ORDER — PHENYLEPHRINE HCL (PRESSORS) 10 MG/ML IV SOLN
INTRAVENOUS | Status: DC | PRN
  Administered 2019-12-09 (×3): 100 ug via INTRAVENOUS

## 2019-12-09 MED ORDER — ONDANSETRON HCL 4 MG/2ML IJ SOLN
INTRAMUSCULAR | Status: DC | PRN
  Administered 2019-12-09: 4 mg via INTRAVENOUS

## 2019-12-09 MED ORDER — FENTANYL CITRATE (PF) 100 MCG/2ML IJ SOLN
25.0000 ug | INTRAMUSCULAR | Status: DC | PRN
  Administered 2019-12-09 (×2): 25 ug via INTRAVENOUS
  Administered 2019-12-09: 50 ug via INTRAVENOUS
  Administered 2019-12-09 (×3): 25 ug via INTRAVENOUS

## 2019-12-09 MED ORDER — SODIUM CHLORIDE 0.9 % IV SOLN: INTRAVENOUS | Status: DC

## 2019-12-09 MED ORDER — OXYCODONE HCL 5 MG PO TABS
5.0000 mg | ORAL_TABLET | Freq: Once | ORAL | Status: AC
  Administered 2019-12-09: 5 mg via ORAL
  Filled 2019-12-09: qty 1

## 2019-12-09 MED ORDER — METOCLOPRAMIDE HCL 10 MG PO TABS: 5.0000 mg | ORAL_TABLET | Freq: Three times a day (TID) | ORAL | Status: DC | PRN

## 2019-12-09 MED ORDER — MORPHINE SULFATE (PF) 2 MG/ML IV SOLN: 0.5000 mg | INTRAVENOUS | Status: DC | PRN

## 2019-12-09 MED ORDER — PROPOFOL 10 MG/ML IV BOLUS
INTRAVENOUS | Status: AC
  Filled 2019-12-09: qty 40

## 2019-12-09 MED ORDER — ONDANSETRON HCL 4 MG/2ML IJ SOLN: 4.0000 mg | Freq: Four times a day (QID) | INTRAMUSCULAR | Status: DC | PRN

## 2019-12-09 MED ORDER — METHOCARBAMOL 1000 MG/10ML IJ SOLN
500.0000 mg | Freq: Four times a day (QID) | INTRAVENOUS | Status: DC | PRN
  Filled 2019-12-09: qty 5

## 2019-12-09 MED ORDER — DEXAMETHASONE SODIUM PHOSPHATE 10 MG/ML IJ SOLN
INTRAMUSCULAR | Status: DC | PRN
  Administered 2019-12-09: 5 mg via INTRAVENOUS

## 2019-12-09 MED ORDER — LACTATED RINGERS IV SOLN: INTRAVENOUS | Status: DC | PRN

## 2019-12-09 MED ORDER — CLINDAMYCIN PHOSPHATE 600 MG/50ML IV SOLN
600.0000 mg | Freq: Four times a day (QID) | INTRAVENOUS | Status: DC
  Administered 2019-12-10 (×2): 600 mg via INTRAVENOUS
  Filled 2019-12-09 (×3): qty 50

## 2019-12-09 MED ORDER — FENTANYL CITRATE (PF) 100 MCG/2ML IJ SOLN
INTRAMUSCULAR | Status: AC
  Filled 2019-12-09: qty 2

## 2019-12-09 MED ORDER — TRAMADOL HCL 50 MG PO TABS
50.0000 mg | ORAL_TABLET | Freq: Four times a day (QID) | ORAL | Status: DC
  Administered 2019-12-10 (×3): 50 mg via ORAL
  Filled 2019-12-09 (×3): qty 1

## 2019-12-09 MED ORDER — LIDOCAINE HCL (CARDIAC) PF 100 MG/5ML IV SOSY
PREFILLED_SYRINGE | INTRAVENOUS | Status: DC | PRN
  Administered 2019-12-09: 60 mg via INTRAVENOUS

## 2019-12-09 MED ORDER — ANASTROZOLE 1 MG PO TABS
1.0000 mg | ORAL_TABLET | Freq: Every day | ORAL | Status: DC
  Administered 2019-12-10: 1 mg via ORAL
  Filled 2019-12-09 (×2): qty 1

## 2019-12-09 MED ORDER — RISAQUAD PO CAPS
1.0000 | ORAL_CAPSULE | Freq: Every day | ORAL | Status: DC
  Administered 2019-12-10: 1 via ORAL
  Filled 2019-12-09: qty 1

## 2019-12-09 MED ORDER — FENTANYL CITRATE (PF) 100 MCG/2ML IJ SOLN
INTRAMUSCULAR | Status: DC | PRN
Start: 2019-12-09 — End: 2019-12-09
  Administered 2019-12-09: 25 ug via INTRAVENOUS
  Administered 2019-12-09: 50 ug via INTRAVENOUS
  Administered 2019-12-09: 25 ug via INTRAVENOUS

## 2019-12-09 MED ORDER — AMLODIPINE BESYLATE 5 MG PO TABS
5.0000 mg | ORAL_TABLET | Freq: Every day | ORAL | Status: DC
  Administered 2019-12-10: 5 mg via ORAL
  Filled 2019-12-09: qty 1

## 2019-12-09 MED ORDER — FENTANYL CITRATE (PF) 100 MCG/2ML IJ SOLN
INTRAMUSCULAR | Status: AC
Start: 2019-12-09 — End: 2019-12-10
  Filled 2019-12-09: qty 2

## 2019-12-09 MED ORDER — SUCCINYLCHOLINE CHLORIDE 20 MG/ML IJ SOLN
INTRAMUSCULAR | Status: DC | PRN
  Administered 2019-12-09: 100 mg via INTRAVENOUS

## 2019-12-09 MED ORDER — ACETAMINOPHEN 325 MG PO TABS: 325.0000 mg | ORAL_TABLET | Freq: Four times a day (QID) | ORAL | Status: DC | PRN

## 2019-12-09 MED ORDER — DOCUSATE SODIUM 100 MG PO CAPS
100.0000 mg | ORAL_CAPSULE | Freq: Two times a day (BID) | ORAL | Status: DC
  Administered 2019-12-10: 100 mg via ORAL
  Filled 2019-12-09: qty 1

## 2019-12-09 MED ORDER — MAGNESIUM OXIDE 400 (241.3 MG) MG PO TABS
400.0000 mg | ORAL_TABLET | Freq: Every day | ORAL | Status: DC
  Administered 2019-12-10: 400 mg via ORAL
  Filled 2019-12-09: qty 1

## 2019-12-09 MED ORDER — ATORVASTATIN CALCIUM 20 MG PO TABS: 40.0000 mg | ORAL_TABLET | Freq: Every day | ORAL | Status: DC

## 2019-12-09 MED ORDER — METOCLOPRAMIDE HCL 5 MG/ML IJ SOLN: 5.0000 mg | Freq: Three times a day (TID) | INTRAMUSCULAR | Status: DC | PRN

## 2019-12-09 MED ORDER — PANTOPRAZOLE SODIUM 40 MG PO TBEC
80.0000 mg | DELAYED_RELEASE_TABLET | Freq: Every day | ORAL | Status: DC
  Administered 2019-12-10: 80 mg via ORAL
  Filled 2019-12-09: qty 2

## 2019-12-09 MED ORDER — NEOMYCIN-POLYMYXIN B GU 40-200000 IR SOLN
Status: AC
  Filled 2019-12-09: qty 20

## 2019-12-09 MED ORDER — HYDROCODONE-ACETAMINOPHEN 5-325 MG PO TABS: 1.0000 | ORAL_TABLET | ORAL | Status: DC | PRN

## 2019-12-09 MED ORDER — HYDROCODONE-ACETAMINOPHEN 7.5-325 MG PO TABS
1.0000 | ORAL_TABLET | ORAL | Status: DC | PRN
  Filled 2019-12-09: qty 2

## 2019-12-09 MED ORDER — IRBESARTAN 150 MG PO TABS
300.0000 mg | ORAL_TABLET | Freq: Every day | ORAL | Status: DC
  Administered 2019-12-10: 300 mg via ORAL
  Filled 2019-12-09: qty 2

## 2019-12-09 MED ORDER — CHLORTHALIDONE 25 MG PO TABS
25.0000 mg | ORAL_TABLET | Freq: Every day | ORAL | Status: DC
  Administered 2019-12-10: 25 mg via ORAL
  Filled 2019-12-09: qty 1

## 2019-12-09 MED ORDER — OXYCODONE HCL 5 MG PO TABS: 5.0000 mg | ORAL_TABLET | Freq: Once | ORAL | Status: DC | PRN

## 2019-12-09 MED ORDER — ZOLPIDEM TARTRATE 5 MG PO TABS: 5.0000 mg | ORAL_TABLET | Freq: Every evening | ORAL | Status: DC | PRN

## 2019-12-09 MED ORDER — OXYCODONE HCL 5 MG/5ML PO SOLN: 5.0000 mg | Freq: Once | ORAL | Status: DC | PRN

## 2019-12-09 MED ORDER — CLINDAMYCIN PHOSPHATE 600 MG/50ML IV SOLN
INTRAVENOUS | Status: AC
  Filled 2019-12-09: qty 50

## 2019-12-09 MED ORDER — LACTATED RINGERS IV BOLUS: 500.0000 mL | Freq: Once | INTRAVENOUS | Status: DC

## 2019-12-09 MED ORDER — LIDOCAINE-EPINEPHRINE 2 %-1:100000 IJ SOLN
30.0000 mL | Freq: Once | INTRAMUSCULAR | Status: DC
  Filled 2019-12-09: qty 2

## 2019-12-09 MED ORDER — PROPOFOL 10 MG/ML IV BOLUS
INTRAVENOUS | Status: DC | PRN
  Administered 2019-12-09: 130 mg via INTRAVENOUS
  Administered 2019-12-09: 20 mg via INTRAVENOUS

## 2019-12-09 MED ORDER — FLUTICASONE PROPIONATE 50 MCG/ACT NA SUSP
2.0000 | Freq: Every day | NASAL | Status: DC | PRN
  Filled 2019-12-09: qty 16

## 2019-12-09 MED ORDER — ONDANSETRON HCL 4 MG/2ML IJ SOLN: 4.0000 mg | Freq: Once | INTRAMUSCULAR | Status: DC | PRN

## 2019-12-09 MED ORDER — LEVOTHYROXINE SODIUM 50 MCG PO TABS
175.0000 ug | ORAL_TABLET | Freq: Every day | ORAL | Status: DC
  Administered 2019-12-10: 175 ug via ORAL
  Filled 2019-12-09: qty 1

## 2019-12-09 SURGICAL SUPPLY — 41 items
BNDG ELASTIC 3X5.8 VLCR NS LF (GAUZE/BANDAGES/DRESSINGS) IMPLANT
CANISTER PREVENA 45 (CANNISTER) ×10 IMPLANT
CANISTER SUCT 1200ML W/VALVE (MISCELLANEOUS) ×5 IMPLANT
CANISTER SUCT 3000ML PPV (MISCELLANEOUS) ×5 IMPLANT
CANISTER WOUND CARE 500ML ATS (WOUND CARE) ×5 IMPLANT
CAST PADDING 3X4FT ST 30246 (SOFTGOODS) ×4
CHLORAPREP W/TINT 26 (MISCELLANEOUS) IMPLANT
COVER WAND RF STERILE (DRAPES) IMPLANT
CUFF TOURN SGL QUICK 18X4 (TOURNIQUET CUFF) IMPLANT
CUFF TOURN SGL QUICK 24 (TOURNIQUET CUFF)
CUFF TRNQT CYL 24X4X16.5-23 (TOURNIQUET CUFF) IMPLANT
DRAPE BILAT LIMB 76X120 89291 (MISCELLANEOUS) ×5 IMPLANT
DRAPE IMP U-DRAPE 54X76 (DRAPES) ×5 IMPLANT
ELECT REM PT RETURN 9FT ADLT (ELECTROSURGICAL) ×5
ELECTRODE REM PT RTRN 9FT ADLT (ELECTROSURGICAL) ×3 IMPLANT
GAUZE SPONGE 4X4 12PLY STRL (GAUZE/BANDAGES/DRESSINGS) ×10 IMPLANT
GAUZE XEROFORM 1X8 LF (GAUZE/BANDAGES/DRESSINGS) ×5 IMPLANT
GLOVE SURG SYN 9.0  PF PI (GLOVE) ×2
GLOVE SURG SYN 9.0 PF PI (GLOVE) ×3 IMPLANT
GOWN SRG 2XL LVL 4 RGLN SLV (GOWNS) ×3 IMPLANT
GOWN STRL NON-REIN 2XL LVL4 (GOWNS) ×2
GOWN STRL REUS W/ TWL LRG LVL3 (GOWN DISPOSABLE) ×3 IMPLANT
GOWN STRL REUS W/TWL LRG LVL3 (GOWN DISPOSABLE) ×2
IV NS IRRIG 3000ML ARTHROMATIC (IV SOLUTION) ×5 IMPLANT
KIT PREVENA INCISION MGT 13 (CANNISTER) ×5 IMPLANT
KIT TURNOVER KIT A (KITS) ×5 IMPLANT
NS IRRIG 500ML POUR BTL (IV SOLUTION) IMPLANT
PACK EXTREMITY (MISCELLANEOUS) ×5 IMPLANT
PAD ABD DERMACEA PRESS 5X9 (GAUZE/BANDAGES/DRESSINGS) ×10 IMPLANT
PAD CAST CTTN 3X4 STRL (SOFTGOODS) ×6 IMPLANT
PAD PREP 24X41 OB/GYN DISP (PERSONAL CARE ITEMS) ×5 IMPLANT
PREP PVP WINGED SPONGE (MISCELLANEOUS) ×5 IMPLANT
SCALPEL PROTECTED #10 DISP (BLADE) ×5 IMPLANT
SCALPEL PROTECTED #15 DISP (BLADE) ×10 IMPLANT
SOL PREP PROV IODINE SCRUB 4OZ (MISCELLANEOUS) ×5 IMPLANT
STAPLER SKIN PROX 35W (STAPLE) ×5 IMPLANT
SUT ETHILON 4 0 P 3 18 (SUTURE) IMPLANT
SUT VIC AB 0 CT1 18XCR BRD 8 (SUTURE) ×3 IMPLANT
SUT VIC AB 0 CT1 8-18 (SUTURE) ×2
SYR BULB IRRIG 60ML STRL (SYRINGE) ×5 IMPLANT
TOWEL OR 17X26 4PK STRL BLUE (TOWEL DISPOSABLE) ×5 IMPLANT

## 2019-12-09 NOTE — Anesthesia Procedure Notes (Signed)
Procedure Name: Intubation Date/Time: 12/09/2019 8:58 PM Performed by: Lowry Bowl, CRNA Pre-anesthesia Checklist: Patient identified, Emergency Drugs available, Suction available and Patient being monitored Patient Re-evaluated:Patient Re-evaluated prior to induction Oxygen Delivery Method: Circle system utilized Preoxygenation: Pre-oxygenation with 100% oxygen Induction Type: IV induction, Cricoid Pressure applied and Rapid sequence Laryngoscope Size: Mac and 3 Grade View: Grade I Tube type: Oral Tube size: 7.0 mm Number of attempts: 1 Airway Equipment and Method: Stylet Placement Confirmation: ETT inserted through vocal cords under direct vision,  positive ETCO2 and breath sounds checked- equal and bilateral Secured at: 21 cm Tube secured with: Tape Dental Injury: Teeth and Oropharynx as per pre-operative assessment

## 2019-12-09 NOTE — Anesthesia Preprocedure Evaluation (Addendum)
Anesthesia Evaluation  Patient identified by MRN, date of birth, ID band Patient awake    Reviewed: Allergy & Precautions, H&P , NPO status , Patient's Chart, lab work & pertinent test results  History of Anesthesia Complications Negative for: history of anesthetic complications  Airway Mallampati: II  TM Distance: >3 FB     Dental  (+) Upper Dentures   Pulmonary asthma , neg sleep apnea, neg COPD,    breath sounds clear to auscultation       Cardiovascular hypertension, (-) angina(-) Past MI and (-) Cardiac Stents (-) dysrhythmias  Rhythm:regular Rate:Normal     Neuro/Psych  Headaches, PSYCHIATRIC DISORDERS Anxiety    GI/Hepatic Neg liver ROS, GERD  Controlled,  Endo/Other  Hypothyroidism   Renal/GU Renal disease (CKD)     Musculoskeletal   Abdominal   Peds  Hematology negative hematology ROS (+)   Anesthesia Other Findings Pt was mowing her lawn with a push mower and ran over a piece of metal which flew out from under the mower and cut her leg.  Here for lac repair  Past Medical History: No date: Allergic rhinitis No date: Anemia     Comment:  low iron No date: Anxiety 07/2019: Arthritis     Comment:  osteoporosis No date: Asthma without status asthmaticus     Comment:  seasonal, not often No date: Bilateral sciatica 07/2019: Breast cancer ([place])     Comment:  invasive mammary cancer , grade 2 No date: Bronchitis No date: Cancer ([place])     Comment:   follicular variant papillary thyroid cancer No date: Chronic back pain     Comment:  unspecified No date: Chronic kidney disease     Comment:  ckd stage 3b No date: Colon polyp     Comment:  adenomatous No date: DDD (degenerative disc disease), lumbar No date: DJD (degenerative joint disease) No date: Dysphagia No date: Dyspnea No date: Environmental allergies No date: Family history of breast cancer No date: Family history of colon cancer No date:  Family history of ovarian cancer No date: Family history of pancreatic cancer No date: Family history of prostate cancer No date: Family history of thyroid cancer No date: GERD (gastroesophageal reflux disease) No date: Headache     Comment:  migraines as a young woman No date: History of recurrent UTIs No date: Hyperlipidemia No date: Hypertension No date: Hypothyroidism No date: IBS (irritable bowel syndrome) No date: Lumbar disc disease 2002: Mold exposure     Comment:  hx of  No date: Osteoporosis, post-menopausal 1978: Pneumonia No date: Restless legs No date: Scoliosis No date: Spinal stenosis No date: Thyroid nodule     Comment:  aspirated in 1997 and 2003 which were negative No date: Ulcer No date: Varicose veins of left lower extremity No date: Vertigo No date: Wears dentures  Past Surgical History: 1977: ABDOMINAL HYSTERECTOMY     Comment:  Dr. Laverta Baltimore 2003: ANKLE FRACTURE SURGERY; Left     Comment:  arch of foot.  no metal. per patient, she did not have               surgery for this fracture. healed on its own 09/09/2016: ANTERIOR LAT LUMBAR FUSION; Left     Comment:  Procedure: Left Lumbar two-three Anterolateral lumbar               interbody fusion with lateral plate;  Surgeon: Erline Levine,  MD;  Location: [facility];  Service: Neurosurgery;                Laterality: Left;  Left L2-3 Anterolateral lumbar               interbody fusion with lateral plate No date: APPENDECTOMY 2013, 2016, 2018: BACK SURGERY     Comment:  lumbar lam, fusion, discectomy.titanium No date: BIOPSY THYROID     Comment:  x2  thyroid nodule ; Dr. Sharlet Salina and one by Dr. Sabra Heck 08/02/2019: BREAST BIOPSY; Left     Comment:  coil clip-INVASIVE MAMMARY CARCINOMA 1970's: BREAST CYST ASPIRATION 08/12/2019: BREAST LUMPECTOMY WITH RADIOFREQUENCY TAG IDENTIFICATION;  Left     Comment:  Procedure: BREAST LUMPECTOMY WITH RADIOFREQUENCY TAG               IDENTIFICATION;  Surgeon:  Ronny Bacon, MD;                Location: [facility];  Service: General;  Laterality: Left; 08/12/2019: BREAST LUMPECTOMY WITH SENTINEL LYMPH NODE BIOPSY; Left     Comment:  Procedure: BREAST LUMPECTOMY WITH SENTINEL LYMPH NODE               BX;  Surgeon: Ronny Bacon, MD;  Location: [facility];               Service: General;  Laterality: Left; 1994: broken arm; Left     Comment:  wrist fracture surgery, left.  metal has been replaced. No date: CARDIOVASCULAR STRESS TEST     Comment:  2010 2010: COLONOSCOPY 07/21/2013: COLONOSCOPY     Comment:  PHX OF CP/REPEAT 40YRS/[place] 09/22/2018: ESOPHAGOGASTRODUODENOSCOPY (EGD) WITH PROPOFOL; N/A     Comment:  Procedure: ESOPHAGOGASTRODUODENOSCOPY (EGD) WITH               PROPOFOL;  Surgeon: Toledo, Benay Pike, MD;  Location:               [facility];  Service: Gastroenterology;  Laterality:               N/A;  Patient to have Rapid COVID-19 day of procedure               Kieth Brightly approved this) No date: EYE SURGERY; Bilateral     Comment:  cataract surgery with lens implant 11/18/2011: LUMBAR LAMINECTOMY/DECOMPRESSION MICRODISCECTOMY     Comment:  Procedure: LUMBAR LAMINECTOMY/DECOMPRESSION               MICRODISCECTOMY 2 LEVELS;  Surgeon: Erline Levine, MD;                Location: [facility] [place] [facility];  Service: Neurosurgery;                Laterality: N/A;  Lumbar Three-Four,  Lumbar Four-Five               Decompressive Laminectomy 09/22/2018: MALONEY DILATION; N/A     Comment:  Procedure: MALONEY DILATION;  Surgeon: Toledo, Benay Pike, MD;  Location: [facility];  Service:               Gastroenterology;  Laterality: N/A; No date: OVARIAN CYST SURGERY 2016: POSTERIOR FUSION LUMBAR SPINE No date: TOTAL THYROIDECTOMY 2010: TRANSTHORACIC ECHOCARDIOGRAM 2010: upper and lower GI  BMI    Body Mass Index: 30.47 kg/m      Reproductive/Obstetrics negative OB ROS  Anesthesia  Physical Anesthesia Plan  ASA: II  Anesthesia Plan: General ETT   Post-op Pain Management:    Induction:   PONV Risk Score and Plan: Dexamethasone, Ondansetron, Midazolam and Treatment may vary due to age or medical condition  Airway Management Planned:   Additional Equipment:   Intra-op Plan:   Post-operative Plan:   Informed Consent: I have reviewed the patients History and Physical, chart, labs and discussed the procedure including the risks, benefits and alternatives for the proposed anesthesia with the patient or authorized representative who has indicated his/her understanding and acceptance.     Dental Advisory Given  Plan Discussed with: Anesthesiologist, CRNA and Surgeon  Anesthesia Plan Comments:        Anesthesia Quick Evaluation

## 2019-12-09 NOTE — Op Note (Signed)
12/09/2019  9:32 PM  PATIENT:  Latoya Stevenson  79 y.o. female  PRE-OPERATIVE DIAGNOSIS:  foreign body left leg with 10 cm laceration in right leg with 5 cm laceration POST-OPERATIVE DIAGNOSIS: Same  PROCEDURE:  Procedure(s): FOREIGN BODY REMOVAL ADULT (N/A) INCISION AND DRAINAGE (Left), repair laceration right lower leg  SURGEON: Laurene Footman, MD  ASSISTANTS: None  ANESTHESIA:   general  EBL:  Total I/O In: 550 [I.V.:500; IV Piggyback:50] Out: 60 [Urine:400; Blood:5]  BLOOD ADMINISTERED:none  DRAINS: Incisional wound VAC on left lower leg   LOCAL MEDICATIONS USED:  NONE  SPECIMEN:  No Specimen  DISPOSITION OF SPECIMEN:  N/A  COUNTS:  YES  TOURNIQUET: No tourniquet used  IMPLANTS: None  DICTATION: .Dragon Dictation patient was brought to the operating room and after adequate general anesthesia was obtained both legs were prepped and draped in the usual sterile fashion.  After patient identification and timeout procedures was completed the right leg was addressed first on examination of the laceration extended to the subcutaneous fat layer there is no foreign body Pap present this wound was thoroughly irrigated and closed with staples closed loosely to allow for drainage.  Then attention was turned to the opposite leg and the wire was removed it was not embedded into the bone just the soft tissue.  On examination of the wound there was no foreign body present the wound was irrigated with pulsatile lavage on the lower setting and after thorough irrigation small amount of devitalized subcutaneous fat was debrided with a scalpel and the deep tissue loosely closed again with 0 Vicryl followed by skin staples with application of a Prevena wound VAC to allow for drainage.  The right leg was dressed with Xeroform 4 x 4's web roll and Ace wrap and Ace wrap was also applied over the left leg to help keep the [place] in place as well as to diminish swelling.  PLAN OF CARE: Admit for  overnight observation  PATIENT DISPOSITION:  PACU - hemodynamically stable.

## 2019-12-09 NOTE — H&P (Signed)
Latoya Stevenson is an 79 y.o. female.   Chief Complaint: Left leg pain HPI: Patient was mowing her yard and ran over a piece of metal that was ejected from the lawnmower striking both legs.  She suffered a laceration to the medial aspect of the right lower leg and a large laceration to the left with a retained piece of metal.  This metal goes from the anterior leg all the way to the back without evidence of the penetrating wound going posteriorly but with a 10 cm laceration present.  She is neurovascularly intact and is being admitted for I&D  Past Medical History:  Diagnosis Date  . Allergic rhinitis   . Anemia    low iron  . Anxiety   . Arthritis 07/2019   osteoporosis  . Asthma without status asthmaticus    seasonal, not often  . Bilateral sciatica   . Breast cancer ([place]) 07/2019   invasive mammary cancer , grade 2  . Bronchitis   . Cancer ([place])     follicular variant papillary thyroid cancer  . Chronic back pain    unspecified  . Chronic kidney disease    ckd stage 3b  . Colon polyp    adenomatous  . DDD (degenerative disc disease), lumbar   . DJD (degenerative joint disease)   . Dysphagia   . Dyspnea   . Environmental allergies   . Family history of breast cancer   . Family history of colon cancer   . Family history of ovarian cancer   . Family history of pancreatic cancer   . Family history of prostate cancer   . Family history of thyroid cancer   . GERD (gastroesophageal reflux disease)   . Headache    migraines as a young woman  . History of recurrent UTIs   . Hyperlipidemia   . Hypertension   . Hypothyroidism   . IBS (irritable bowel syndrome)   . Lumbar disc disease   . Mold exposure 2002   hx of   . Osteoporosis, post-menopausal   . Pneumonia 1978  . Restless legs   . Scoliosis   . Spinal stenosis   . Thyroid nodule    aspirated in 1997 and 2003 which were negative  . Ulcer   . Varicose veins of left lower extremity   . Vertigo   . Wears dentures      Past Surgical History:  Procedure Laterality Date  . ABDOMINAL HYSTERECTOMY  1977   Dr. Laverta Baltimore  . ANKLE FRACTURE SURGERY Left 2003   arch of foot.  no metal. per patient, she did not have surgery for this fracture. healed on its own  . ANTERIOR LAT LUMBAR FUSION Left 09/09/2016   Procedure: Left Lumbar two-three Anterolateral lumbar interbody fusion with lateral plate;  Surgeon: Erline Levine, MD;  Location: [facility];  Service: Neurosurgery;  Laterality: Left;  Left L2-3 Anterolateral lumbar interbody fusion with lateral plate  . APPENDECTOMY    . BACK SURGERY  2013, 2016, 2018   lumbar lam, fusion, discectomy.titanium  . BIOPSY THYROID     x2  thyroid nodule ; Dr. Sharlet Salina and one by Dr. Sabra Heck  . BREAST BIOPSY Left 08/02/2019   coil clip-INVASIVE MAMMARY CARCINOMA  . BREAST CYST ASPIRATION  1970's  . BREAST LUMPECTOMY WITH RADIOFREQUENCY TAG IDENTIFICATION Left 07/17/7739   Procedure: BREAST LUMPECTOMY WITH RADIOFREQUENCY TAG IDENTIFICATION;  Surgeon: Ronny Bacon, MD;  Location: [facility];  Service: General;  Laterality: Left;  . BREAST LUMPECTOMY WITH  SENTINEL LYMPH NODE BIOPSY Left 08/12/2019   Procedure: BREAST LUMPECTOMY WITH SENTINEL LYMPH NODE BX;  Surgeon: Ronny Bacon, MD;  Location: [facility];  Service: General;  Laterality: Left;  . broken arm Left 1994   wrist fracture surgery, left.  metal has been replaced.  Marland Kitchen CARDIOVASCULAR STRESS TEST     2010  . COLONOSCOPY  2010  . COLONOSCOPY  07/21/2013   PHX OF CP/REPEAT 54YRS/[place]  . ESOPHAGOGASTRODUODENOSCOPY (EGD) WITH PROPOFOL N/A 09/22/2018   Procedure: ESOPHAGOGASTRODUODENOSCOPY (EGD) WITH PROPOFOL;  Surgeon: Toledo, Benay Pike, MD;  Location: [facility];  Service: Gastroenterology;  Laterality: N/A;  Patient to have Rapid COVID-19 day of procedure Kieth Brightly approved this)  . EYE SURGERY Bilateral    cataract surgery with lens implant  . LUMBAR LAMINECTOMY/DECOMPRESSION MICRODISCECTOMY  11/18/2011   Procedure: LUMBAR  LAMINECTOMY/DECOMPRESSION MICRODISCECTOMY 2 LEVELS;  Surgeon: Erline Levine, MD;  Location: [place] [facility];  Service: Neurosurgery;  Laterality: N/A;  Lumbar Three-Four,  Lumbar Four-Five Decompressive Laminectomy  . MALONEY DILATION N/A 09/22/2018   Procedure: MALONEY DILATION;  Surgeon: Toledo, Benay Pike, MD;  Location: [facility];  Service: Gastroenterology;  Laterality: N/A;  . OVARIAN CYST SURGERY    . POSTERIOR FUSION LUMBAR SPINE  2016  . TOTAL THYROIDECTOMY    . TRANSTHORACIC ECHOCARDIOGRAM  2010  . upper and lower GI  2010    Family History  Problem Relation Age of Onset  . Breast cancer Mother 20  . Thyroid cancer Mother   . Pancreatic cancer Brother   . Pancreatic cancer Sister   . Suicidality Father   . Prostate cancer Brother   . Colon cancer Brother   . Breast cancer Sister        x 2 times  . Heart attack Sister   . Ovarian cancer Maternal Aunt   . Breast cancer Paternal Aunt   . Lung cancer Paternal Uncle   . Breast cancer Other   . Colon cancer Maternal Aunt   . Pancreatic cancer Maternal Aunt    Social History:  reports that she has never smoked. She has never used smokeless tobacco. She reports current alcohol use. She reports that she does not use drugs.  Allergies:  Allergies  Allergen Reactions  . Flagyl [Metronidazole Hcl] Swelling    Swelling in face, mouth, throat, palms of feet and hands.  . Furosemide Itching    Made side of mouth irritated and puffy.  The rim inside her lip swelled up  . Macrodantin [Nitrofurantoin Macrocrystal] Anaphylaxis, Hives and Swelling  . Penicillins Anaphylaxis and Swelling    PATIENT HAD A PCN REACTION WITH IMMEDIATE RASH, FACIAL/TONGUE/THROAT SWELLING, SOB, OR LIGHTHEADEDNESS WITH HYPOTENSION:  #  #  #  YES  #  #  #   Has patient had a PCN reaction causing severe rash involving mucus membranes or skin necrosis: No Has patient had a PCN reaction that required hospitalization: No Has patient had a PCN reaction occurring  within the last 10 years: No If all of the above answers are "NO", then may proceed with Cephalosporin use.   . Celebrex [Celecoxib] Swelling    Swelling in joints.  Did not agree with her. Did not help her either  . Other     Pt says it was recently discovered she allergic to an ingredient in local anesthesia but she can't recall the name. (epinephrine and the additive used for the biopsy. Gets very nervous. Feels internally shaking. Happened when she had her biopsy for cancer of  her face (2018 and 2021)    (Not in a hospital admission)   Results for orders placed or performed during the hospital encounter of 12/09/19 (from the past 48 hour(s))  Basic metabolic panel     Status: Abnormal   Collection Time: 12/09/19  5:25 PM  Result Value Ref Range   Sodium 141 135 - 145 mmol/L   Potassium 3.9 3.5 - 5.1 mmol/L   Chloride 105 98 - 111 mmol/L   CO2 25 22 - 32 mmol/L   Glucose, Bld 100 (H) 70 - 99 mg/dL    Comment: Glucose reference range applies only to samples taken after fasting for at least 8 hours.   BUN 38 (H) 8 - 23 mg/dL   Creatinine, Ser 1.44 (H) 0.44 - 1.00 mg/dL   Calcium 9.0 8.9 - 10.3 mg/dL   GFR calc non Af Amer 35 (L) >60 mL/min   GFR calc Af Amer 40 (L) >60 mL/min   Anion gap 11 5 - 15    Comment: Performed at [facility], [address] [place], [place] [address]   DG Tibia/Fibula Left  Result Date: 12/09/2019 CLINICAL DATA:  projectile laceration, eval fx. eval foreign body placement Ran over piece of metal while mowing which shot into her leg. EXAM: LEFT TIBIA AND FIBULA - 2 VIEW COMPARISON:  None. FINDINGS: Within the subcutaneous tissues of the distal medial lower leg is a 76.1 cm linear metallic foreign body. This is oriented anterior-posterior. No fracture of the lower leg. Generalized soft tissue edema distally. IMPRESSION: 1. Linear 95.0 cm metallic foreign body in the subcutaneous tissues of the distal medial lower leg. 2. No lower leg fracture. 3.  Generalized soft tissue edema. Electronically Signed   By: Keith Rake M.D.   On: 12/09/2019 18:56    Review of Systems Negative for any shortness of breath chest pain loss of consciousness Blood pressure (!) 141/91, pulse 98, temperature 97.7 F (36.5 C), temperature source Oral, height 5\' 3"  (1.6 m), weight 78 kg, SpO2 96 %. Physical Exam  Patient is alert and oriented Lungs are clear Heart regular rate and rhythm Patient wears dentures Left leg has approximately 10 cm laceration with a piece of metal metal visible in the anterior portion of the wound.  The wound is on the medial aspect of the leg.  She is able to flex extend the toes and there do not appear to be tendon involvement.  Sensation to the foot is intact.  She has palpable pulses. Assessment/Plan Deep laceration to the left leg with retained piece of metal, more shallow right leg laceration Plans for irrigation debridement of left leg with IV antibiotics overnight and repair of laceration to the right leg.  Plan wound VAC application to the left leg as well to prevent infection.  Hessie Knows, MD 12/09/2019, 7:49 PM

## 2019-12-09 NOTE — ED Provider Notes (Signed)
[facility] Emergency Department Provider Note ____________________________________________   First MD Initiated Contact with Patient 12/09/19 1725     (approximate)  I have reviewed the triage vital signs and the nursing notes.  HISTORY  Chief Complaint Leg Injury   HPI Latoya Stevenson is a 79 y.o. femalewho presents to the ED for evaluation of leg injury.   Chart review indicates hx HTN, HLD, and hypothyroidism.  Patient takes no antithrombotic medications.  No aspirin.  Patient reports returning from vacation today and needing to cut her grass.  She reports using a push mower to cut her grass when she "must have picked up some metal."  She reports an accidental injury to her bilateral legs from flung mental from cutting her grass this afternoon.  This happened just prior to arrival.  She denies any head trauma or fall to the ground.  She denies any trauma beyond lacerations to her bilateral lower extremities.  She reports currently 9/10 intensity pain to her left shin that is constant, aching in nature, improved transiently with EMS fentanyl.     Past Medical History:  Diagnosis Date  . Allergic rhinitis   . Anemia    low iron  . Anxiety   . Arthritis 07/2019   osteoporosis  . Asthma without status asthmaticus    seasonal, not often  . Bilateral sciatica   . Breast cancer ([place]) 07/2019   invasive mammary cancer , grade 2  . Bronchitis   . Cancer ([place])     follicular variant papillary thyroid cancer  . Chronic back pain    unspecified  . Chronic kidney disease    ckd stage 3b  . Colon polyp    adenomatous  . DDD (degenerative disc disease), lumbar   . DJD (degenerative joint disease)   . Dysphagia   . Dyspnea   . Environmental allergies   . Family history of breast cancer   . Family history of colon cancer   . Family history of ovarian cancer   . Family history of pancreatic cancer   . Family history of prostate cancer   . Family  history of thyroid cancer   . GERD (gastroesophageal reflux disease)   . Headache    migraines as a young woman  . History of recurrent UTIs   . Hyperlipidemia   . Hypertension   . Hypothyroidism   . IBS (irritable bowel syndrome)   . Lumbar disc disease   . Mold exposure 2002   hx of   . Osteoporosis, post-menopausal   . Pneumonia 1978  . Restless legs   . Scoliosis   . Spinal stenosis   . Thyroid nodule    aspirated in 1997 and 2003 which were negative  . Ulcer   . Varicose veins of left lower extremity   . Vertigo   . Wears dentures     Patient Active Problem List   Diagnosis Date Noted  . Genetic testing 08/29/2019  . Family history of breast cancer   . Family history of ovarian cancer   . Family history of pancreatic cancer   . Family history of prostate cancer   . Family history of colon cancer   . Family history of thyroid cancer   . Goals of care, counseling/discussion 08/12/2019  . Malignant neoplasm of 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 ([place]) 08/04/2019  . Hyperlipidemia 02/03/2018  . Esophageal stenosis 01/28/2018  . Lymphedema 10/14/2017  . Hypertension 08/19/2017  . Swelling of limb  08/19/2017  . Pain in limb 08/19/2017  . DOE (dyspnea on exertion) 07/29/2017  . Non-seasonal allergic rhinitis 07/29/2017  . Varicose veins of left leg with edema 07/29/2017  . Dysphagia, oropharyngeal 04/30/2017  . Lumbar degenerative disc disease 04/30/2017  . Osteopenia of multiple sites 04/30/2017  . Spinal stenosis of lumbar region 12/08/2016  . Lumbar compression fracture ([place]) 12/02/2016  . 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 Chronic midline low back pain with right-sided sciatica 07/28/2016  . Gastroesophageal reflux disease without esophagitis 07/27/2015  . Intermittent vertigo 07/27/2015  . 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 Seasonal allergic rhinitis due to pollen 07/27/2015  . Acquired hypothyroidism 01/05/2015  . Lumbar scoliosis  10/27/2014  . Benign essential hypertension 06/29/2014  . Insomnia 11/11/2013  . Mild reactive airways disease 11/11/2013  . Bilateral sciatica 09/29/2013    Past Surgical History:  Procedure Laterality Date  . ABDOMINAL HYSTERECTOMY  1977   Dr. Laverta Baltimore  . ANKLE FRACTURE SURGERY Left 2003   arch of foot.  no metal. per patient, she did not have surgery for this fracture. healed on its own  . ANTERIOR LAT LUMBAR FUSION Left 09/09/2016   Procedure: Left Lumbar two-three Anterolateral lumbar interbody fusion with lateral plate;  Surgeon: Erline Levine, MD;  Location: [facility];  Service: Neurosurgery;  Laterality: Left;  Left L2-3 Anterolateral lumbar interbody fusion with lateral plate  . APPENDECTOMY    . BACK SURGERY  2013, 2016, 2018   lumbar lam, fusion, discectomy.titanium  . BIOPSY THYROID     x2  thyroid nodule ; Dr. Sharlet Salina and one by Dr. Sabra Heck  . BREAST BIOPSY Left 08/02/2019   coil clip-INVASIVE MAMMARY CARCINOMA  . BREAST CYST ASPIRATION  1970's  . BREAST LUMPECTOMY WITH RADIOFREQUENCY TAG IDENTIFICATION Left 2/33/0076   Procedure: BREAST LUMPECTOMY WITH RADIOFREQUENCY TAG IDENTIFICATION;  Surgeon: Ronny Bacon, MD;  Location: [facility];  Service: General;  Laterality: Left;  . BREAST LUMPECTOMY WITH SENTINEL LYMPH NODE BIOPSY Left 08/12/2019   Procedure: BREAST LUMPECTOMY WITH SENTINEL LYMPH NODE BX;  Surgeon: Ronny Bacon, MD;  Location: [facility];  Service: General;  Laterality: Left;  . broken arm Left 1994   wrist fracture surgery, left.  metal has been replaced.  Marland Kitchen CARDIOVASCULAR STRESS TEST     2010  . COLONOSCOPY  2010  . COLONOSCOPY  07/21/2013   PHX OF CP/REPEAT 26YRS/OH  . ESOPHAGOGASTRODUODENOSCOPY (EGD) WITH PROPOFOL N/A 09/22/2018   Procedure: ESOPHAGOGASTRODUODENOSCOPY (EGD) WITH PROPOFOL;  Surgeon: Toledo, Benay Pike, MD;  Location: [facility];  Service: Gastroenterology;  Laterality: N/A;  Patient to have Rapid COVID-19 day of procedure Kieth Brightly approved  this)  . EYE SURGERY Bilateral    cataract surgery with lens implant  . LUMBAR LAMINECTOMY/DECOMPRESSION MICRODISCECTOMY  11/18/2011   Procedure: LUMBAR LAMINECTOMY/DECOMPRESSION MICRODISCECTOMY 2 LEVELS;  Surgeon: Erline Levine, MD;  Location: [place] [facility];  Service: Neurosurgery;  Laterality: N/A;  Lumbar Three-Four,  Lumbar Four-Five Decompressive Laminectomy  . MALONEY DILATION N/A 09/22/2018   Procedure: MALONEY DILATION;  Surgeon: Toledo, Benay Pike, MD;  Location: [facility];  Service: Gastroenterology;  Laterality: N/A;  . OVARIAN CYST SURGERY    . POSTERIOR FUSION LUMBAR SPINE  2016  . TOTAL THYROIDECTOMY    . TRANSTHORACIC ECHOCARDIOGRAM  2010  . upper and lower GI  2010    Prior to Admission medications   Medication Sig Start Date End Date Taking? Authorizing Provider  acetaminophen (TYLENOL) 500 MG tablet Take 1,000 mg by mouth every 6 (six) hours  as needed.    [provider]  acidophilus (RISAQUAD) CAPS capsule Take 1 capsule by mouth daily.    [provider]  amLODipine (NORVASC) 5 MG tablet Take 5 mg by mouth daily. Takes in the evening    [provider]  anastrozole (ARIMIDEX) 1 MG tablet Take 1 tablet (1 mg total) by mouth daily. 10/21/19   Jacquelin Hawking, NP  atorvastatin (LIPITOR) 40 MG tablet Take 40 mg by mouth at bedtime.     [provider]  Calcium Carbonate-Vitamin D (CALCIUM 600+D) 600-400 MG-UNIT tablet Take 2 tablets by mouth daily.    [provider]  chlorthalidone (HYGROTON) 25 MG tablet Take 25 mg by mouth in the morning and at bedtime.  09/24/17   [provider]  cholecalciferol (VITAMIN D3) 25 MCG (1000 UNIT) tablet Take 1,000 Units by mouth daily.    [provider]  diphenhydrAMINE (SOMINEX) 25 MG tablet Take 25 mg by mouth at bedtime.     [provider]  doxylamine, Sleep, (UNISOM) 25 MG tablet Take 25 mg by mouth at bedtime.    [provider]  fexofenadine (ALLEGRA) 180  MG tablet Take 180 mg by mouth daily.     [provider]  fluticasone (FLONASE) 50 MCG/ACT nasal spray Place 2 sprays into both nostrils daily as needed for allergies.     [provider]  GLUCOSAMINE-CHONDROITIN PO Take 1 tablet by mouth daily.     [provider]  hydrochlorothiazide (HYDRODIURIL) 25 MG tablet Take 25 mg by mouth 2 (two) times daily.    [provider]  irbesartan (AVAPRO) 300 MG tablet Take 300 mg by mouth daily.     [provider]  levothyroxine (SYNTHROID) 175 MCG tablet Take 175 mcg by mouth every morning. 07/02/19   [provider]  magnesium oxide (MAG-OX) 400 MG tablet Take 400 mg by mouth daily. 11/10/19 11/09/20  [provider]  Multiple Vitamin (MULTIVITAMIN WITH MINERALS) TABS Take 1 tablet by mouth daily.    [provider]  nabumetone (RELAFEN) 500 MG tablet Take 500 mg by mouth 2 (two) times daily.  06/02/17   [provider]  omeprazole (PRILOSEC) 40 MG capsule Take 40 mg by mouth daily before breakfast.     [provider]  vitamin E (VITAMIN E) 180 MG (400 UNITS) capsule Take 400 Units by mouth daily.    [provider]    Allergies Flagyl [metronidazole hcl], Furosemide, Macrodantin [nitrofurantoin macrocrystal], Penicillins, Celebrex [celecoxib], and Other  Family History  Problem Relation Age of Onset  . Breast cancer Mother 67  . Thyroid cancer Mother   . Pancreatic cancer Brother   . Pancreatic cancer Sister   . Suicidality Father   . Prostate cancer Brother   . Colon cancer Brother   . Breast cancer Sister        x 2 times  . Heart attack Sister   . Ovarian cancer Maternal Aunt   . Breast cancer Paternal Aunt   . Lung cancer Paternal Uncle   . Breast cancer Other   . Colon cancer Maternal Aunt   . Pancreatic cancer Maternal Aunt     Social History Social History   Tobacco Use  . Smoking status: Never Smoker  . Smokeless tobacco: Never  Used  Vaping Use  . Vaping Use: Never used  Substance Use Topics  . Alcohol use: Yes    Comment: socially  . Drug use: No  Review of Systems  Constitutional: No fever/chills Eyes: No visual changes. ENT: No sore throat. Cardiovascular: Denies chest pain. Respiratory: Denies shortness of breath. Gastrointestinal: No abdominal pain.  No nausea, no vomiting.  No diarrhea.  No constipation. Genitourinary: Negative for dysuria. Musculoskeletal: Positive for left leg pain. Skin: Negative for rash. Neurological: Negative for headaches, focal weakness or numbness.  ____________________________________________   PHYSICAL EXAM:  VITAL SIGNS: Vitals:   12/09/19 1915 12/09/19 1930  BP:    Pulse:  98  Temp:    SpO2: 100% 96%      Constitutional: Alert and oriented. Well appearing and in no acute distress. Eyes: Conjunctivae are normal. PERRL. EOMI. Head: Atraumatic. Nose: No congestion/rhinnorhea. Mouth/Throat: Mucous membranes are moist.  Oropharynx non-erythematous. Neck: No stridor. No cervical spine tenderness to palpation. Cardiovascular: Normal rate, regular rhythm. Grossly normal heart sounds.  Good peripheral circulation. Respiratory: Normal respiratory effort.  No retractions. Lungs CTAB. Gastrointestinal: Soft , nondistended, nontender to palpation. No abdominal bruits. No CVA tenderness. Musculoskeletal: No joint effusions.  No signs of trauma to the torso, upper extremities, lower extremities above the knees or head. Smaller more superficial laceration to the medial/anterior aspect of the right lower extremity.  Laceration into the subcutaneous tissue that is about 4 cm in length, clean margins and hemostatic.  Distally neurovascularly intact without evidence of bony tenderness or additional trauma to the right leg. More significant laceration to the anterior/medial aspect of the left lower extremity, as pictured below.  At the inferior/medial portion of this is a  small metal object protruding from the skin.  Area is hemostatic and distally neurovascularly intact. Neurologic:  Normal speech and language. No gross focal neurologic deficits are appreciated. No gait instability noted. Skin:  Skin is warm, dry and intact. No rash noted. Psychiatric: Mood and affect are normal. Speech and behavior are normal.  Left leg lac below:      ____________________________________________   LABS (all labs ordered are listed, but only abnormal results are displayed)  Labs Reviewed  BASIC METABOLIC PANEL - Abnormal; Notable for the following components:      Result Value   Glucose, Bld 100 (*)    BUN 38 (*)    Creatinine, Ser 1.44 (*)    GFR calc non Af Amer 35 (*)    GFR calc Af Amer 40 (*)    All other components within normal limits  RESPIRATORY PANEL BY RT PCR (FLU A&B, COVID)   __________________________________________  RADIOLOGY  ED MD interpretation: X-ray of the left tib-fib reviewed and demonstrates no evidence of bony abnormality, but does demonstrate a large 10 cm radiodense foreign body throughout the subcutaneous tissue  Official radiology report(s): DG Tibia/Fibula Left  Result Date: 12/09/2019 CLINICAL DATA:  projectile laceration, eval fx. eval foreign body placement Ran over piece of metal while mowing which shot into her leg. EXAM: LEFT TIBIA AND FIBULA - 2 VIEW COMPARISON:  None. FINDINGS: Within the subcutaneous tissues of the distal medial lower leg is a 66.2 cm linear metallic foreign body. This is oriented anterior-posterior. No fracture of the lower leg. Generalized soft tissue edema distally. IMPRESSION: 1. Linear 94.7 cm metallic foreign body in the subcutaneous tissues of the distal medial lower leg. 2. No lower leg fracture. 3. Generalized soft tissue edema. Electronically Signed   By: Keith Rake M.D.   On: 12/09/2019 18:56    ____________________________________________   PROCEDURES and INTERVENTIONS  Procedure(s)  performed (including Critical Care):  Procedures  Medications  lidocaine-EPINEPHrine (XYLOCAINE  W/EPI) 2 %-1:100000 (with pres) injection 30 mL (has no administration in time range)  lactated ringers bolus 500 mL (has no administration in time range)  acetaminophen (TYLENOL) tablet 1,000 mg (1,000 mg Oral Given 12/09/19 1827)  oxyCODONE (Oxy IR/ROXICODONE) immediate release tablet 5 mg (5 mg Oral Given 12/09/19 1827)    ____________________________________________   MDM / ED COURSE  79 year old woman not on antithrombotic medication presents after accidental laceration to the left leg requiring visit to the OR for I&D and foreign body removal with orthopedics.  Normal vital signs on room air.  Exam without evidence of distress or trauma beyond the lacerations to her bilateral lower extremities.  No neurovascular deficits.  As pictured above, larger laceration to her left lower leg with foreign body in place, hemostatic.  Small laceration to her right leg that would be amenable to a simple bedside repair.  X-ray obtained of her left leg to evaluate the extent of the foreign body, and demonstrates a rather large 10 cm foreign body extending throughout the AP diameter of her lower leg.  Due to this, orthopedics was consulted and they recommend transfer to the OR for foreign body removal, I&D and possible wound VAC placement.  Patient's pain is well controlled in the ED. blood work performed at the request of orthopedics indicates a mild prerenal AKI, for which she received 500 cc of LR.  Will admit the patient to orthopedics for further work-up and management of her accidental laceration.  Clinical Course as of Dec 08 1952  Fri Dec 09, 2019  1858 Spoke with Dr. Rudene Christians, who is reviewing XRs   [DS]  1910 Educated patient on need for the OR.  Answered questions.   [DS]    Clinical Course User Index [DS] Vladimir Crofts, MD     ____________________________________________   FINAL CLINICAL  IMPRESSION(S) / ED DIAGNOSES  Final diagnoses:  Laceration of left lower extremity, initial encounter  Foreign body of left lower leg, initial encounter  AKI (acute kidney injury) [facility])     ED Discharge Orders    None       Twain Stenseth   Note:  This document was prepared using Dragon voice recognition software and may include unintentional dictation errors.   Vladimir Crofts, MD 12/09/19 [phone]

## 2019-12-09 NOTE — ED Notes (Signed)
Pt leaving for surgery.

## 2019-12-09 NOTE — ED Notes (Signed)
Provider Tamala Julian D notified lidocaine brought to bedside. Oozing of blood has continued so that an abdominal pad is full of blood. Site covered with new pad.

## 2019-12-09 NOTE — ED Notes (Signed)
Sent full rainbow of tubes to lab for bloodwork.

## 2019-12-09 NOTE — ED Notes (Signed)
Contacted lab to add on BMP

## 2019-12-09 NOTE — Transfer of Care (Signed)
Immediate Anesthesia Transfer of Care Note  Patient: Latoya Stevenson  Procedure(s) Performed: FOREIGN BODY REMOVAL ADULT (N/A ) INCISION AND DRAINAGE (Left )  Patient Location: PACU  Anesthesia Type:General  Level of Consciousness: awake, drowsy and patient cooperative  Airway & Oxygen Therapy: Patient Spontanous Breathing  Post-op Assessment: Report given to RN and Post -op Vital signs reviewed and stable  Post vital signs: Reviewed and stable  Last Vitals:  Vitals Value Taken Time  BP 108/73 12/09/19 2133  Temp    Pulse 68 12/09/19 2135  Resp 13 12/09/19 2135  SpO2 94 % 12/09/19 2135  Vitals shown include unvalidated device data.  Last Pain:  Vitals:   12/09/19 1828  TempSrc:   PainSc: 7          Complications: No complications documented.

## 2019-12-09 NOTE — ED Notes (Signed)
Report given to floor by Charge RN. Report given to OR/surgery by Metta Clines RN.

## 2019-12-09 NOTE — ED Triage Notes (Signed)
Pt in via EMS from home after mowning her lawn. While mowing she hit a large piece of metal which was then shot into her L leg. R leg with 2-3 inch lac to lower leg; bleeding controlled with gauze from EMS. L leg deep open lac about 5 inches long. Bleeding currently controlled; oozing some; metal noted in wound. Bilateral dorsalis pedis pulses 2+; feet warm. Legs/ankles swollen which pt states is chronic. 87HR; NSR; 98% RA; 20RR; 144/99BP with EMS. 18G IV at L ac; 79mcg fentanyl given by EMS. Pt A&Ox4.

## 2019-12-10 MED ORDER — NEOMYCIN-POLYMYXIN B GU 40-200000 IR SOLN
Status: DC | PRN
  Administered 2019-12-10: 16 mL

## 2019-12-10 MED ORDER — CLINDAMYCIN HCL 300 MG PO CAPS: 300.0000 mg | ORAL_CAPSULE | Freq: Three times a day (TID) | ORAL | 0 refills | Status: AC

## 2019-12-10 MED ORDER — HYDROCODONE-ACETAMINOPHEN 5-325 MG PO TABS
1.0000 | ORAL_TABLET | ORAL | 0 refills | Status: DC | PRN
Start: 2019-12-10 — End: 2020-02-02

## 2019-12-10 NOTE — Discharge Summary (Signed)
Physician Discharge Summary  Patient ID: Latoya Stevenson MRN: [id number] DOB/AGE: 1940-10-02 79 y.o.  Admit date: 12/09/2019 Discharge date: 12/10/2019  Admission Diagnoses:  AKI (acute kidney injury) ([place]) [N17.9] Laceration of left leg [S81.812A] Laceration of left lower extremity, initial encounter [P38.250N] Foreign body of left lower leg, initial encounter [S80.852A]   Discharge Diagnoses: Patient Active Problem List   Diagnosis Date Noted  . Laceration of left leg 12/09/2019  . Genetic testing 08/29/2019  . Family history of breast cancer   . Family history of ovarian cancer   . Family history of pancreatic cancer   . Family history of prostate cancer   . Family history of colon cancer   . Family history of thyroid cancer   . Goals of care, counseling/discussion 08/12/2019  . Malignant neoplasm of 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 ([place]) 08/04/2019  . Hyperlipidemia 02/03/2018  . Esophageal stenosis 01/28/2018  . Lymphedema 10/14/2017  . Hypertension 08/19/2017  . Swelling of limb 08/19/2017  . Pain in limb 08/19/2017  . DOE (dyspnea on exertion) 07/29/2017  . Non-seasonal allergic rhinitis 07/29/2017  . Varicose veins of left leg with edema 07/29/2017  . Dysphagia, oropharyngeal 04/30/2017  . Lumbar degenerative disc disease 04/30/2017  . Osteopenia of multiple sites 04/30/2017  . Spinal stenosis of lumbar region 12/08/2016  . Lumbar compression fracture ([place]) 12/02/2016  . 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 Chronic midline low back pain with right-sided sciatica 07/28/2016  . Gastroesophageal reflux disease without esophagitis 07/27/2015  . Intermittent vertigo 07/27/2015  . 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 Seasonal allergic rhinitis due to pollen 07/27/2015  . Acquired hypothyroidism 01/05/2015  . Lumbar scoliosis 10/27/2014  . Benign essential hypertension 06/29/2014  . Insomnia 11/11/2013  . Mild reactive airways disease 11/11/2013   . Bilateral sciatica 09/29/2013    Past Medical History:  Diagnosis Date  . Allergic rhinitis   . Anemia    low iron  . Anxiety   . Arthritis 07/2019   osteoporosis  . Asthma without status asthmaticus    seasonal, not often  . Bilateral sciatica   . Breast cancer ([place]) 07/2019   invasive mammary cancer , grade 2  . Bronchitis   . Cancer ([place])     follicular variant papillary thyroid cancer  . Chronic back pain    unspecified  . Chronic kidney disease    ckd stage 3b  . Colon polyp    adenomatous  . DDD (degenerative disc disease), lumbar   . DJD (degenerative joint disease)   . Dysphagia   . Dyspnea   . Environmental allergies   . Family history of breast cancer   . Family history of colon cancer   . Family history of ovarian cancer   . Family history of pancreatic cancer   . Family history of prostate cancer   . Family history of thyroid cancer   . GERD (gastroesophageal reflux disease)   . Headache    migraines as a young woman  . History of recurrent UTIs   . Hyperlipidemia   . Hypertension   . Hypothyroidism   . IBS (irritable bowel syndrome)   . Lumbar disc disease   . Mold exposure 2002   hx of   . Osteoporosis, post-menopausal   . Pneumonia 1978  . Restless legs   . Scoliosis   . Spinal stenosis   . Thyroid nodule    aspirated in 1997 and 2003 which were negative  . Ulcer   .  Varicose veins of left lower extremity   . Vertigo   . Wears dentures      Transfusion: none   Consultants (if any):   Discharged Condition: Improved  Hospital Course: Latoya Stevenson is an 79 y.o. female who was admitted 12/09/2019 with a diagnosis of <principal problem not specified> and went to the operating room on 12/09/2019 and underwent the above named procedures.    Surgeries: Procedure(s): FOREIGN BODY REMOVAL ADULT INCISION AND DRAINAGE REPAIR MULTIPLE LACERATIONS on 12/09/2019 Patient tolerated the surgery well. Taken to PACU where she was stabilized and  then transferred to the orthopedic floor.  Patient started on IV antibiotics, clindamycin.  Overnight pain well controlled, no drainage along the dressings.  Patient doing well.  Vital signs are stable.  Patient stable and ready for discharge to home.   She was given perioperative antibiotics:  Anti-infectives (From admission, onward)   Start     Dose/Rate Route Frequency Ordered Stop   12/10/19 0300  clindamycin (CLEOCIN) IVPB 600 mg        600 mg 100 mL/hr over 30 Minutes Intravenous Every 6 hours 12/09/19 2319 12/10/19 2059   12/10/19 0000  clindamycin (CLEOCIN) 300 MG capsule        300 mg Oral 3 times daily 12/10/19 0648 12/17/19 2359   12/09/19 2000  clindamycin (CLEOCIN) IVPB 600 mg        600 mg 100 mL/hr over 30 Minutes Intravenous  Once 12/09/19 1955 12/09/19 2101    .  She benefited maximally from the hospital stay and there were no complications.    Recent vital signs:  Vitals:   12/10/19 0149 12/10/19 0406  BP: 108/64 106/72  Pulse: 62 69  Resp: 16 17  Temp: (!) 97.5 F (36.4 C) (!) 97.5 F (36.4 C)  SpO2: 96% 98%    Recent laboratory studies:  Lab Results  Component Value Date   HGB 13.2 10/10/2019   HGB 12.7 09/26/2019   HGB 11.1 (L) 11/27/2016   Lab Results  Component Value Date   WBC 5.1 10/10/2019   PLT 162 10/10/2019   Lab Results  Component Value Date   INR 1.11 08/16/2009   Lab Results  Component Value Date   NA 141 12/09/2019   K 3.9 12/09/2019   CL 105 12/09/2019   CO2 25 12/09/2019   BUN 38 (H) 12/09/2019   CREATININE 1.44 (H) 12/09/2019   GLUCOSE 100 (H) 12/09/2019    Discharge Medications:   Allergies as of 12/10/2019      Reactions   Flagyl [metronidazole Hcl] Swelling   Swelling in face, mouth, throat, palms of feet and hands.   Furosemide Itching   Made side of mouth irritated and puffy.  The rim inside her lip swelled up   Macrodantin [nitrofurantoin Macrocrystal] Anaphylaxis, Hives, Swelling   Penicillins Anaphylaxis,  Swelling   PATIENT HAD A PCN REACTION WITH IMMEDIATE RASH, FACIAL/TONGUE/THROAT SWELLING, SOB, OR LIGHTHEADEDNESS WITH HYPOTENSION:  #  #  #  YES  #  #  #   Has patient had a PCN reaction causing severe rash involving mucus membranes or skin necrosis: No Has patient had a PCN reaction that required hospitalization: No Has patient had a PCN reaction occurring within the last 10 years: No If all of the above answers are "NO", then may proceed with Cephalosporin use.   Celebrex [celecoxib] Swelling   Swelling in joints.  Did not agree with her. Did not help her either   Other  Pt says it was recently discovered she allergic to an ingredient in local anesthesia but she can't recall the name. (epinephrine and the additive used for the biopsy. Gets very nervous. Feels internally shaking. 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 (2018 and 2021)      Medication List    TAKE these medications   acetaminophen 500 MG tablet Commonly known as: TYLENOL Take 1,000 mg by mouth every 6 (six) hours as needed.   acidophilus Caps capsule Take 1 capsule by mouth daily.   amLODipine 5 MG tablet Commonly known as: NORVASC Take 5 mg by mouth daily. Takes in the evening   anastrozole 1 MG tablet Commonly known as: ARIMIDEX Take 1 tablet (1 mg total) by mouth daily.   atorvastatin 40 MG tablet Commonly known as: LIPITOR Take 40 mg by mouth at bedtime.   Calcium 600+D 600-400 MG-UNIT tablet Generic drug: Calcium Carbonate-Vitamin D Take 2 tablets by mouth daily.   chlorthalidone 25 MG tablet Commonly known as: HYGROTON Take 25 mg by mouth in the morning and at bedtime.   cholecalciferol 25 MCG (1000 UNIT) tablet Commonly known as: VITAMIN D3 Take 1,000 Units by mouth daily.   clindamycin 300 MG capsule Commonly known as: Cleocin Take 1 capsule (300 mg total) by mouth 3 (three) times daily for 7 days.   diphenhydrAMINE 25 MG tablet Commonly known as: SOMINEX Take 25 mg by  mouth at bedtime.   doxylamine (Sleep) 25 MG tablet Commonly known as: UNISOM Take 25 mg by mouth at bedtime.   fexofenadine 180 MG tablet Commonly known as: ALLEGRA Take 180 mg by mouth daily.   fluticasone 50 MCG/ACT nasal spray Commonly known as: FLONASE Place 2 sprays into both nostrils daily as needed for allergies.   GLUCOSAMINE-CHONDROITIN PO Take 1 tablet by mouth daily.   hydrochlorothiazide 25 MG tablet Commonly known as: HYDRODIURIL Take 25 mg by mouth 2 (two) times daily.   HYDROcodone-acetaminophen 5-325 MG tablet Commonly known as: NORCO/VICODIN Take 1-2 tablets by mouth every 4 (four) hours as needed for moderate pain (pain score 4-6).   irbesartan 300 MG tablet Commonly known as: AVAPRO Take 300 mg by mouth daily.   levothyroxine 175 MCG tablet Commonly known as: SYNTHROID Take 175 mcg by mouth every morning.   magnesium oxide 400 MG tablet Commonly known as: MAG-OX Take 400 mg by mouth daily.   multivitamin with minerals Tabs tablet Take 1 tablet by mouth daily.   nabumetone 500 MG tablet Commonly known as: RELAFEN Take 500 mg by mouth 2 (two) times daily.   omeprazole 40 MG capsule Commonly known as: PRILOSEC Take 40 mg by mouth daily before breakfast.   vitamin E 180 MG (400 UNITS) capsule Generic drug: vitamin E Take 400 Units by mouth daily.       Diagnostic Studies: DG Tibia/Fibula Left  Result Date: 12/09/2019 CLINICAL DATA:  projectile laceration, eval fx. eval foreign body placement Ran over piece of metal while mowing which shot into her leg. EXAM: LEFT TIBIA AND FIBULA - 2 VIEW COMPARISON:  None. FINDINGS: Within the subcutaneous tissues of the distal medial lower leg is a 98.9 cm linear metallic foreign body. This is oriented anterior-posterior. No fracture of the lower leg. Generalized soft tissue edema distally. IMPRESSION: 1. Linear 21.1 cm metallic foreign body in the subcutaneous tissues of the distal medial lower leg. 2. No  lower leg fracture. 3. Generalized soft tissue edema. Electronically Signed   By: Threasa Beards  [place] M.D.   On: 12/09/2019 18:56    Disposition:      Follow-up Information    Duanne Guess, PA-C Follow up on 12/16/2019.   Specialties: Orthopedic Surgery, Emergency Medicine Why: For lower extremity dressing changes Contact information: [place] [address] [phone]                Signed: Feliberto Gottron 12/10/2019, 6:50 AM

## 2019-12-10 NOTE — TOC Transition Note (Signed)
Transition of Care [facility]) - CM/SW Discharge Note   Patient Details  Name: EVALEIGH MCCAMY MRN: [id number] Date of Birth: 05/23/1940  Transition of Care [facility]) CM/SW Contact:  Boris Sharper, LCSW Phone Number: 12/10/2019, 4:19 PM   Clinical Narrative:    Pt medically stable for discharge per MD. CSW sent [facility] and [facility] was able to accept for [facility]  MD wants hold a week for start of care, Start of Care 10/4 CSW notified Tillie Rung.    Final next level of care: [place] Barriers to Discharge: No Barriers Identified   Patient Goals and CMS Choice        Discharge Placement                Patient to be transferred to facility by: significant other Name of family member notified: Quillian Quince Patient and family notified of of transfer: 12/10/19  Discharge Plan and [place]: [facility] [place] Date [place]: 12/10/19 Time [place]: [place] Representative spoke with at [place]: Reine Just  Social Determinants of Health (SDOH) Interventions     Readmission Risk Interventions No flowsheet data found.

## 2019-12-10 NOTE — Discharge Instructions (Signed)
Diet: As you were doing prior to hospitalization   Dressing: Keep dressings clean and dry at all times.  Activity:  Increase activity slowly as tolerated, but follow the weight bearing instructions below.    Weight Bearing:   Weight bearing as tolerated to left and right lower extremity  To prevent constipation: you may use a stool softener such as -  Colace (over the counter) 100 mg by mouth twice a day  Drink plenty of fluids (prune juice may be helpful) and high fiber foods Miralax (over the counter) for constipation as needed.    Itching:  If you experience itching with your medications, try taking only a single pain pill, or even half a pain pill at a time.  You may take up to 10 pain pills per day, and you can also use benadryl over the counter for itching or also to help with sleep.   Precautions:  If you experience chest pain or shortness of breath - call 911 immediately for transfer to the hospital emergency department!!  If you develop a fever greater that 101 F, purulent drainage from wound, increased redness or drainage from wound, or calf pain-Call Latoya Stevenson                                              Follow- Up Appointment:  Please call for an appointment to be seen 12/16/2019 at [facility] orth[facility].  At this visit we will remove both dressings to the lower extremities and evaluate both wounds.

## 2019-12-10 NOTE — Progress Notes (Signed)
Admitted to room 135 from PACU post I&D left lower leg with wound vac and repair laceration right lower leg. Ace wraps intact to both legs. Wound vac working properly on left lower leg. A/O. Admission explained. Talking to family and friends on her phone. Belongings in bag next to bed. bilat feet warm. Eating and drinking without nausea or vomiting.

## 2019-12-10 NOTE — Progress Notes (Signed)
No drainage from wound vac.

## 2019-12-10 NOTE — Evaluation (Signed)
Physical Therapy Evaluation Patient Details Name: Latoya Stevenson MRN: [id number] DOB: 1940-06-08 Today's Date: 12/10/2019   History of Present Illness  Pt is a 79 y.o. female presenting to hospital 9/24.  Per MD note: "Patient was mowing her yard and ran over a piece of metal that was ejected from the lawnmower striking both legs.  She suffered a laceration to the medial aspect of the right lower leg and a large laceration to the left with a retained piece of metal.  This metal goes from the anterior leg all the way to the back without evidence of the penetrating wound going posteriorly but with a 10 cm laceration present."  Pt s/p 9/24 foreign body removal, I&D L LE, and laceration repair R lower leg.  PMH includes htn, HLD, hypothyroidism, anemia, anxiety, breast CA, CKD, chronic back pain, IBS, RLS, vertigo, L ankle surgery, h/o multiple back surgeries.  Clinical Impression  Prior to hospital admission, pt was independent with functional mobility; lives with her boyfriend in 1 level home with 3 STE.  Currently pt is modified independent semi-supine to sitting edge of bed, CGA to SBA with transfers (using RW); CGA with ambulation 90 feet and then 150 feet (with RW); and CGA to navigate 4 steps with UE support.  Pain 4/10 B lower legs beginning/during/and end of session (pt received pain medication prior to PT session).  Pt would benefit from skilled PT to address noted impairments and functional limitations (see below for any additional details).  Upon hospital discharge, pt would benefit from [place].    Follow Up Recommendations Home health PT    Equipment Recommendations  Rolling walker with 5" wheels (pt has RW at home already)    Recommendations for Other Services       Precautions / Restrictions Precautions Precautions: Fall Precaution Comments: B leg lacerations Restrictions Weight Bearing Restrictions: Yes RLE Weight Bearing: Weight bearing as tolerated LLE Weight Bearing: Weight  bearing as tolerated Other Position/Activity Restrictions: Elevate operative extremities; wound vac L LE      Mobility  Bed Mobility Overal bed mobility: Modified Independent             General bed mobility comments: Semi-supine to sitting edge of bed with mild increased effort to perform on own  Transfers Overall transfer level: Needs assistance Equipment used: Rolling walker (2 wheeled) Transfers: Sit to/from [facility] Sit to Stand: Min guard;Supervision Stand pivot transfers: Min guard (stand step turn bed to recliner with RW)       General transfer comment: x1 trial standing from bed, x1 trial standing from recliner, and x2 trials standing from mat table; mild increased effort to stand but overall steady and safe  Ambulation/Gait Ambulation/Gait assistance: Min guard Gait Distance (Feet):  (90 feet; 150 feet) Assistive device: Rolling walker (2 wheeled)   Gait velocity: mildly decreased   General Gait Details: initially mildly antalgic L LE with decreased B LE step length but improved step length and decreased antalgic gait noted with ambulation during session  Stairs Stairs: Yes Stairs assistance: Min guard Stair Management: Step to pattern;Forwards;One rail Right;One rail Left (pt holding onto L rail to simulate holding onto doorframe at home) Number of Stairs: 4 General stair comments: initial vc's for stepping pattern ascending/descending steps; steady and safe  Wheelchair Mobility    Modified Rankin (Stroke Patients Only)       Balance Overall balance assessment: Needs assistance Sitting-balance support: No upper extremity supported;Feet supported Sitting balance-Leahy Scale: Normal Sitting balance -  Comments: steady sitting reaching outside BOS   Standing balance support: No upper extremity supported Standing balance-Leahy Scale: Fair Standing balance comment: steady static standing (pt preferring to hold onto stable surface with  at least 1 hand)                             Pertinent Vitals/Pain Pain Assessment: 0-10 Pain Score: 4  Pain Location: B lower leg wounds Pain Descriptors / Indicators: Aching;Tender;Sore Pain Intervention(s): Limited activity within patient's tolerance;Monitored during session;Premedicated before session;Repositioned  Vitals (HR and O2 on room air) stable and WFL throughout treatment session.    Home Living Family/patient expects to be discharged to:: Private residence Living Arrangements: Spouse/significant other (pt's boyfriend) Available Help at Discharge: Friend(s);Available PRN/intermittently Type of Home: House Home Access: Stairs to enter Entrance Stairs-Rails: Right (holds onto doorframe on L) Entrance Stairs-Number of Steps: 3 Home Layout: One level Home Equipment: Walker - 2 wheels;Grab bars - tub/shower;Grab bars - toilet;Cane - single point      Prior Function Level of Independence: Independent         Comments: Pt reports no recent falls.  Pt's boyfriend can assist as needed (although goes back to work on Monday).     Hand Dominance        Extremity/Trunk Assessment   Upper Extremity Assessment Upper Extremity Assessment: Overall WFL for tasks assessed    Lower Extremity Assessment Lower Extremity Assessment:  (at least 3/5 AROM B hip flexion, knee flexion/extension, and DF/PF)    Cervical / Trunk Assessment Cervical / Trunk Assessment: Normal  Communication   Communication: No difficulties  Cognition Arousal/Alertness: Awake/alert Behavior During Therapy: WFL for tasks assessed/performed Overall Cognitive Status: Within Functional Limits for tasks assessed                                        General Comments General comments (skin integrity, edema, etc.): L LE wound vac in place; dressings in place B lower legs.  Nursing cleared pt for participation in physical therapy.  Pt agreeable to PT session.    Exercises   Transfer, gait, and stair training   Assessment/Plan    PT Assessment Patient needs continued PT services  PT Problem List Decreased strength;Decreased activity tolerance;Decreased balance;Decreased mobility;Decreased knowledge of use of DME;Pain;Decreased skin integrity       PT Treatment Interventions DME instruction;Gait training;Stair training;Functional mobility training;Therapeutic activities;Therapeutic exercise;Balance training;Patient/family education    PT Goals (Current goals can be found in the Care Plan section)  Acute Rehab PT Goals Patient Stated Goal: to go home PT Goal Formulation: With patient Time For Goal Achievement: 12/24/19 Potential to Achieve Goals: Good    Frequency Min 2X/week   Barriers to discharge        Co-evaluation               AM-PAC PT "6 Clicks" Mobility  Outcome Measure Help needed turning from your back to your side while in a flat bed without using bedrails?: None Help needed moving from lying on your back to sitting on the side of a flat bed without using bedrails?: None Help needed moving to and from a bed to a chair (including a wheelchair)?: A Little Help needed standing up from a chair using your arms (e.g., wheelchair or bedside chair)?: A Little Help needed to walk in hospital room?: A  Little Help needed climbing 3-5 steps with a railing? : A Little 6 Click Score: 20    End of Session Equipment Utilized During Treatment: Gait belt Activity Tolerance: Patient tolerated treatment well;No increased pain Patient left: in chair;with call bell/phone within reach;with chair alarm set;Other (comment) (B LE's elevated via pillows) Nurse Communication: Mobility status;Precautions;Weight bearing status PT Visit Diagnosis: Other abnormalities of gait and mobility (R26.89);Muscle weakness (generalized) (M62.81);Difficulty in walking, not elsewhere classified (R26.2);Pain Pain - Right/Left: Left Pain - part of body: Leg    Time:  9791-5041 PT Time Calculation (min) (ACUTE ONLY): 51 min   Charges:   PT Evaluation $PT Eval Low Complexity: 1 Low PT Treatments $Gait Training: 8-22 mins $Therapeutic Activity: 8-22 mins       Leitha Bleak, PT 12/10/19, 1:11 PM

## 2019-12-10 NOTE — Progress Notes (Signed)
Subjective: 1 Day Post-Op Procedure(s) (LRB): FOREIGN BODY REMOVAL ADULT (N/A) INCISION AND DRAINAGE (Left) REPAIR MULTIPLE LACERATIONS (Bilateral) Patient reports pain as mild.   Patient is well, and has had no acute complaints or problems Denies any CP, SOB, ABD pain. Plan is to go home today. Objective: Vital signs in last 24 hours: Temp:  [96.9 F (36.1 C)-97.7 F (36.5 C)] 97.5 F (36.4 C) (09/25 0406) Pulse Rate:  [62-98] 69 (09/25 0406) Resp:  [10-26] 17 (09/25 0406) BP: (97-154)/(57-91) 106/72 (09/25 0406) SpO2:  [88 %-100 %] 98 % (09/25 0406) Weight:  [78 kg-84 kg] 84 kg (09/24 2244)  Intake/Output from previous day: 09/24 0701 - 09/25 0700 In: 1359.1 [P.O.:500; I.V.:759.1; IV Piggyback:100] Out: 555 [Urine:550; Blood:5] Intake/Output this shift: Total I/O In: 1359.1 [P.O.:500; I.V.:759.1; IV Piggyback:100] Out: 555 [Urine:550; Blood:5]  No results for input(s): HGB in the last 72 hours. No results for input(s): WBC, RBC, HCT, PLT in the last 72 hours. Recent Labs    12/09/19 1725  NA 141  K 3.9  CL 105  CO2 25  BUN 38*  CREATININE 1.44*  GLUCOSE 100*  CALCIUM 9.0   No results for input(s): LABPT, INR in the last 72 hours.  EXAM General - Patient is Alert, Appropriate and Oriented Bilateral lower extremity - Neurovascular intact Sensation intact distally Intact pulses distally Dorsiflexion/Plantar flexion intact No cellulitis present Compartment soft  Dressing - dressing C/D/I and no drainage, Praveena intact left lower extremity no drainage. Motor Function - intact, moving foot and toes well on exam.   Past Medical History:  Diagnosis Date  . Allergic rhinitis   . Anemia    low iron  . Anxiety   . Arthritis 07/2019   osteoporosis  . Asthma without status asthmaticus    seasonal, not often  . Bilateral sciatica   . Breast cancer ([place]) 07/2019   invasive mammary cancer , grade 2  . Bronchitis   . Cancer ([place])     follicular variant  papillary thyroid cancer  . Chronic back pain    unspecified  . Chronic kidney disease    ckd stage 3b  . Colon polyp    adenomatous  . DDD (degenerative disc disease), lumbar   . DJD (degenerative joint disease)   . Dysphagia   . Dyspnea   . Environmental allergies   . Family history of breast cancer   . Family history of colon cancer   . Family history of ovarian cancer   . Family history of pancreatic cancer   . Family history of prostate cancer   . Family history of thyroid cancer   . GERD (gastroesophageal reflux disease)   . Headache    migraines as a young woman  . History of recurrent UTIs   . Hyperlipidemia   . Hypertension   . Hypothyroidism   . IBS (irritable bowel syndrome)   . Lumbar disc disease   . Mold exposure 2002   hx of   . Osteoporosis, post-menopausal   . Pneumonia 1978  . Restless legs   . Scoliosis   . Spinal stenosis   . Thyroid nodule    aspirated in 1997 and 2003 which were negative  . Ulcer   . Varicose veins of left lower extremity   . Vertigo   . Wears dentures     Assessment/Plan:   1 Day Post-Op Procedure(s) (LRB): FOREIGN BODY REMOVAL ADULT (N/A) INCISION AND DRAINAGE (Left) REPAIR MULTIPLE LACERATIONS (Bilateral) Active Problems:  Laceration of left leg  Estimated body mass index is 32.8 kg/m as calculated from the following:   Height as of this encounter: 5\' 3"  (1.6 m).   Weight as of this encounter: 84 kg.  Continue with IV antibiotics. Plan on discharge to home today Follow-up with [facility] orthopedics 12/16/2019 Will prescribe p.o. antibiotics at discharge   T. Rachelle Hora, PA-C [place] 12/10/2019, 6:44 AM

## 2019-12-11 NOTE — Anesthesia Postprocedure Evaluation (Signed)
Anesthesia Post Note  Patient: RENAE MOTTLEY  Procedure(s) Performed: FOREIGN BODY REMOVAL ADULT (N/A ) INCISION AND DRAINAGE (Left ) REPAIR MULTIPLE LACERATIONS (Bilateral Leg Lower)  Patient location during evaluation: PACU Anesthesia Type: General Level of consciousness: awake and alert Pain management: pain level controlled Vital Signs Assessment: post-procedure vital signs reviewed and stable Respiratory status: spontaneous breathing, nonlabored ventilation and respiratory function stable Cardiovascular status: blood pressure returned to baseline and stable Postop Assessment: no apparent nausea or vomiting Anesthetic complications: no   No complications documented.   Last Vitals:  Vitals:   12/10/19 0406 12/10/19 0832  BP: 106/72 114/69  Pulse: 69 67  Resp: 17 16  Temp: (!) 36.4 C 36.5 C  SpO2: 98% 97%    Last Pain:  Vitals:   12/10/19 0832  TempSrc: Oral  PainSc: 0-No pain                 Tera Mater

## 2019-12-12 ENCOUNTER — Encounter: Payer: Self-pay | Admitting: Orthopedic Surgery

## 2019-12-14 DIAGNOSIS — S81811D Laceration without foreign body, right lower leg, subsequent encounter: Secondary | ICD-10-CM | POA: Diagnosis not present

## 2019-12-14 DIAGNOSIS — M1990 Unspecified osteoarthritis, unspecified site: Secondary | ICD-10-CM | POA: Diagnosis not present

## 2019-12-14 DIAGNOSIS — I890 Lymphedema, not elsewhere classified: Secondary | ICD-10-CM | POA: Diagnosis not present

## 2019-12-14 DIAGNOSIS — S81822D Laceration with foreign body, left lower leg, subsequent encounter: Secondary | ICD-10-CM | POA: Diagnosis not present

## 2019-12-14 DIAGNOSIS — I129 Hypertensive chronic kidney disease with stage 1 through stage 4 chronic kidney disease, or unspecified chronic kidney disease: Secondary | ICD-10-CM | POA: Diagnosis not present

## 2019-12-14 DIAGNOSIS — M5136 Other intervertebral disc degeneration, lumbar region: Secondary | ICD-10-CM | POA: Diagnosis not present

## 2019-12-14 DIAGNOSIS — M810 Age-related osteoporosis without current pathological fracture: Secondary | ICD-10-CM | POA: Diagnosis not present

## 2019-12-14 DIAGNOSIS — N1832 Chronic kidney disease, stage 3b: Secondary | ICD-10-CM | POA: Diagnosis not present

## 2019-12-14 DIAGNOSIS — M48061 Spinal stenosis, lumbar region without neurogenic claudication: Secondary | ICD-10-CM | POA: Diagnosis not present

## 2019-12-16 DIAGNOSIS — Z79811 Long term (current) use of aromatase inhibitors: Secondary | ICD-10-CM | POA: Diagnosis not present

## 2019-12-16 DIAGNOSIS — Z853 Personal history of malignant neoplasm of breast: Secondary | ICD-10-CM | POA: Diagnosis not present

## 2019-12-16 DIAGNOSIS — M810 Age-related osteoporosis without current pathological fracture: Secondary | ICD-10-CM | POA: Diagnosis not present

## 2019-12-16 DIAGNOSIS — Z8781 Personal history of (healed) traumatic fracture: Secondary | ICD-10-CM | POA: Diagnosis not present

## 2019-12-16 DIAGNOSIS — N1832 Chronic kidney disease, stage 3b: Secondary | ICD-10-CM | POA: Diagnosis not present

## 2019-12-18 ENCOUNTER — Other Ambulatory Visit: Payer: Self-pay | Admitting: Oncology

## 2019-12-19 DIAGNOSIS — M1990 Unspecified osteoarthritis, unspecified site: Secondary | ICD-10-CM | POA: Diagnosis not present

## 2019-12-19 DIAGNOSIS — I890 Lymphedema, not elsewhere classified: Secondary | ICD-10-CM | POA: Diagnosis not present

## 2019-12-19 DIAGNOSIS — I129 Hypertensive chronic kidney disease with stage 1 through stage 4 chronic kidney disease, or unspecified chronic kidney disease: Secondary | ICD-10-CM | POA: Diagnosis not present

## 2019-12-19 DIAGNOSIS — S81811D Laceration without foreign body, right lower leg, subsequent encounter: Secondary | ICD-10-CM | POA: Diagnosis not present

## 2019-12-19 DIAGNOSIS — M5136 Other intervertebral disc degeneration, lumbar region: Secondary | ICD-10-CM | POA: Diagnosis not present

## 2019-12-19 DIAGNOSIS — S81822D Laceration with foreign body, left lower leg, subsequent encounter: Secondary | ICD-10-CM | POA: Diagnosis not present

## 2019-12-19 DIAGNOSIS — M48061 Spinal stenosis, lumbar region without neurogenic claudication: Secondary | ICD-10-CM | POA: Diagnosis not present

## 2019-12-19 DIAGNOSIS — M810 Age-related osteoporosis without current pathological fracture: Secondary | ICD-10-CM | POA: Diagnosis not present

## 2019-12-19 DIAGNOSIS — N1832 Chronic kidney disease, stage 3b: Secondary | ICD-10-CM | POA: Diagnosis not present

## 2019-12-20 ENCOUNTER — Encounter: Payer: Self-pay | Admitting: Oncology

## 2019-12-20 ENCOUNTER — Other Ambulatory Visit: Payer: Self-pay

## 2019-12-20 ENCOUNTER — Inpatient Hospital Stay (HOSPITAL_BASED_OUTPATIENT_CLINIC_OR_DEPARTMENT_OTHER): Payer: Medicare HMO | Admitting: Oncology

## 2019-12-20 ENCOUNTER — Inpatient Hospital Stay: Payer: Medicare HMO | Attending: Oncology

## 2019-12-20 VITALS — BP 119/71 | HR 87 | Temp 96.8°F | Resp 16 | Wt 175.0 lb

## 2019-12-20 DIAGNOSIS — Z79811 Long term (current) use of aromatase inhibitors: Secondary | ICD-10-CM

## 2019-12-20 DIAGNOSIS — M8580 Other specified disorders of bone density and structure, unspecified site: Secondary | ICD-10-CM | POA: Diagnosis not present

## 2019-12-20 DIAGNOSIS — Z170 Estrogen receptor positive status [ER+]: Secondary | ICD-10-CM | POA: Diagnosis not present

## 2019-12-20 DIAGNOSIS — D649 Anemia, unspecified: Secondary | ICD-10-CM

## 2019-12-20 DIAGNOSIS — M85852 Other specified disorders of bone density and structure, left thigh: Secondary | ICD-10-CM

## 2019-12-20 DIAGNOSIS — C50312 Malignant neoplasm of 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: Secondary | ICD-10-CM | POA: Insufficient documentation

## 2019-12-20 DIAGNOSIS — Z5181 Encounter for therapeutic drug level monitoring: Secondary | ICD-10-CM | POA: Diagnosis not present

## 2019-12-20 LAB — CBC WITH DIFFERENTIAL/PLATELET
Abs Immature Granulocytes: 0.06 10*3/uL (ref 0.00–0.07)
Basophils Absolute: 0.1 10*3/uL (ref 0.0–0.1)
Basophils Relative: 1 %
Eosinophils Absolute: 0.2 10*3/uL (ref 0.0–0.5)
Eosinophils Relative: 4 %
HCT: 32.3 % — ABNORMAL LOW (ref 36.0–46.0)
Hemoglobin: 10.5 g/dL — ABNORMAL LOW (ref 12.0–15.0)
Immature Granulocytes: 1 %
Lymphocytes Relative: 18 %
Lymphs Abs: 1.1 10*3/uL (ref 0.7–4.0)
MCH: 29.2 pg (ref 26.0–34.0)
MCHC: 32.5 g/dL (ref 30.0–36.0)
MCV: 89.7 fL (ref 80.0–100.0)
Monocytes Absolute: 0.5 10*3/uL (ref 0.1–1.0)
Monocytes Relative: 8 %
Neutro Abs: 4.1 10*3/uL (ref 1.7–7.7)
Neutrophils Relative %: 68 %
Platelets: 206 10*3/uL (ref 150–400)
RBC: 3.6 MIL/uL — ABNORMAL LOW (ref 3.87–5.11)
RDW: 14.4 % (ref 11.5–15.5)
WBC: 6 10*3/uL (ref 4.0–10.5)
nRBC: 0 % (ref 0.0–0.2)

## 2019-12-20 LAB — COMPREHENSIVE METABOLIC PANEL
ALT: 22 U/L (ref 0–44)
AST: 22 U/L (ref 15–41)
Albumin: 3.8 g/dL (ref 3.5–5.0)
Alkaline Phosphatase: 79 U/L (ref 38–126)
Anion gap: 12 (ref 5–15)
BUN: 39 mg/dL — ABNORMAL HIGH (ref 8–23)
CO2: 23 mmol/L (ref 22–32)
Calcium: 9.1 mg/dL (ref 8.9–10.3)
Chloride: 104 mmol/L (ref 98–111)
Creatinine, Ser: 1.78 mg/dL — ABNORMAL HIGH (ref 0.44–1.00)
GFR calc non Af Amer: 27 mL/min — ABNORMAL LOW (ref >60)
Glucose, Bld: 136 mg/dL — ABNORMAL HIGH (ref 70–99)
Potassium: 4 mmol/L (ref 3.5–5.1)
Sodium: 139 mmol/L (ref 135–145)
Total Bilirubin: 1.3 mg/dL — ABNORMAL HIGH (ref 0.3–1.2)
Total Protein: 7.5 g/dL (ref 6.5–8.1)

## 2019-12-21 DIAGNOSIS — I129 Hypertensive chronic kidney disease with stage 1 through stage 4 chronic kidney disease, or unspecified chronic kidney disease: Secondary | ICD-10-CM | POA: Diagnosis not present

## 2019-12-21 DIAGNOSIS — S81811D Laceration without foreign body, right lower leg, subsequent encounter: Secondary | ICD-10-CM | POA: Diagnosis not present

## 2019-12-21 DIAGNOSIS — M5136 Other intervertebral disc degeneration, lumbar region: Secondary | ICD-10-CM | POA: Diagnosis not present

## 2019-12-21 DIAGNOSIS — M810 Age-related osteoporosis without current pathological fracture: Secondary | ICD-10-CM | POA: Diagnosis not present

## 2019-12-21 DIAGNOSIS — M48061 Spinal stenosis, lumbar region without neurogenic claudication: Secondary | ICD-10-CM | POA: Diagnosis not present

## 2019-12-21 DIAGNOSIS — I890 Lymphedema, not elsewhere classified: Secondary | ICD-10-CM | POA: Diagnosis not present

## 2019-12-21 DIAGNOSIS — M1990 Unspecified osteoarthritis, unspecified site: Secondary | ICD-10-CM | POA: Diagnosis not present

## 2019-12-21 DIAGNOSIS — S81822D Laceration with foreign body, left lower leg, subsequent encounter: Secondary | ICD-10-CM | POA: Diagnosis not present

## 2019-12-21 DIAGNOSIS — N1832 Chronic kidney disease, stage 3b: Secondary | ICD-10-CM | POA: Diagnosis not present

## 2019-12-23 ENCOUNTER — Other Ambulatory Visit: Payer: Self-pay | Admitting: *Deleted

## 2019-12-23 DIAGNOSIS — M5136 Other intervertebral disc degeneration, lumbar region: Secondary | ICD-10-CM | POA: Diagnosis not present

## 2019-12-23 DIAGNOSIS — M810 Age-related osteoporosis without current pathological fracture: Secondary | ICD-10-CM | POA: Diagnosis not present

## 2019-12-23 DIAGNOSIS — I129 Hypertensive chronic kidney disease with stage 1 through stage 4 chronic kidney disease, or unspecified chronic kidney disease: Secondary | ICD-10-CM | POA: Diagnosis not present

## 2019-12-23 DIAGNOSIS — M48061 Spinal stenosis, lumbar region without neurogenic claudication: Secondary | ICD-10-CM | POA: Diagnosis not present

## 2019-12-23 DIAGNOSIS — S81811D Laceration without foreign body, right lower leg, subsequent encounter: Secondary | ICD-10-CM | POA: Diagnosis not present

## 2019-12-23 DIAGNOSIS — N1832 Chronic kidney disease, stage 3b: Secondary | ICD-10-CM | POA: Diagnosis not present

## 2019-12-23 DIAGNOSIS — S81822D Laceration with foreign body, left lower leg, subsequent encounter: Secondary | ICD-10-CM | POA: Diagnosis not present

## 2019-12-23 MED ORDER — ANASTROZOLE 1 MG PO TABS
1.0000 mg | ORAL_TABLET | Freq: Every day | ORAL | 0 refills | Status: DC
Start: 2019-12-23 — End: 2020-01-15

## 2019-12-23 NOTE — Progress Notes (Signed)
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 She then noticed possible nipple discharge from her right breast which prompted a diagnostic right breast mammogram which was essentially unremarkable. MRI was recommended and patient underwent a bilateral MRI which did not show any concerning findings in the right breast where she had the nipple discharge but showed an enhancing mass in the inner left breast measuring 0.9 x 0.6 x 0.7 cm which was suspicious and biopsy. Biopsy was consistent with invasive mammary carcinoma grade 2 6 mm ER greater than 90% positive PR greater than 90% positive and HER-2 negative.  Family history significant for breast cancer in her maternal grandmother at the age of 76. Sister had breast cancer in her 21s. Another sister had pancreatic cancer at 1. Brother with pancreatic cancer. Another brother with prostate and colon cancer.  Genetic testing negative  Final pathology showed 7 mm grade 2 invasive mammary carcinoma with positive unifocal anterior margin.  1 sentinel lymph node negative for malignancy.  PT1BPN0 tumor was greater than 90% ER positive PR positive and HER-2 negative   Interval history-patient reports tolerating Arimidex well without any significant side effects.  She has some mild baseline fatigue.   Denies any significant hot flashes or joint aches and pains.She recently had a left leg laceration and states that she lost a lot of blood.  ECOG PS- 1 Pain scale- 0   Review of systems- Review of Systems  Constitutional: Positive for malaise/fatigue. Negative for chills, fever and weight loss.  HENT: Negative for congestion, ear discharge and nosebleeds.   Eyes: Negative for blurred vision.  Respiratory: Negative for cough, hemoptysis, sputum production, shortness of breath and wheezing.   Cardiovascular: Negative for chest pain, palpitations, orthopnea and claudication.  Gastrointestinal: Negative for abdominal pain, blood in stool, constipation, diarrhea, heartburn, melena, nausea and vomiting.  Genitourinary: Negative for dysuria, flank pain, frequency, hematuria and urgency.  Musculoskeletal: Negative for back pain, joint pain and myalgias.  Skin: Negative for rash.  Neurological: Negative for dizziness, tingling, focal weakness, seizures, weakness and headaches.  Endo/Heme/Allergies: Does not bruise/bleed easily.  Psychiatric/Behavioral: Negative for depression and suicidal ideas. The patient does not have insomnia.       Allergies  Allergen Reactions  . Flagyl [Metronidazole Hcl] Swelling    Swelling in face, mouth, throat, palms of feet and hands.  . Furosemide Itching    Made side of mouth irritated and puffy.  The rim inside her lip swelled up  . Macrodantin [Nitrofurantoin Macrocrystal] Anaphylaxis, Hives and Swelling  . Penicillins Anaphylaxis and Swelling    PATIENT HAD A PCN REACTION WITH IMMEDIATE RASH, FACIAL/TONGUE/THROAT SWELLING, SOB, OR LIGHTHEADEDNESS WITH HYPOTENSION:  #  #  #  YES  #  #  #   Has patient had a PCN reaction causing severe rash involving mucus membranes or skin necrosis: No Has patient had a PCN reaction that required hospitalization: No Has patient  had a PCN reaction occurring within the last 10 years: No If all of the above answers are "NO",  then may proceed with Cephalosporin use.   . Celebrex [Celecoxib] Swelling    Swelling in joints.  Did not agree with her. Did not help her either  . Other     Pt says it was recently discovered she allergic to an ingredient in local anesthesia but she can't recall the name. (epinephrine and the additive used for the biopsy. Gets very nervous. Feels internally shaking. 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 Allergic rhinitis   . Anemia    low iron  . Anxiety   . Arthritis 07/2019   osteoporosis  . Asthma without status asthmaticus    seasonal, not often  . Bilateral sciatica   . Breast cancer ([place]) 07/2019   invasive mammary cancer , grade 2  . Bronchitis   . Cancer ([place])     follicular variant papillary thyroid cancer  . Chronic back pain    unspecified  . Chronic kidney disease    ckd stage 3b  . Colon polyp    adenomatous  . DDD (degenerative disc disease), lumbar   . DJD (degenerative joint disease)   . Dysphagia   . Dyspnea   . Environmental allergies   . Family history of breast cancer   . Family history of colon cancer   . Family history of ovarian cancer   . Family history of pancreatic cancer   . Family history of prostate cancer   . Family history of thyroid cancer   . GERD (gastroesophageal reflux disease)   . Headache    migraines as a young woman  . History of recurrent UTIs   . Hyperlipidemia   . Hypertension   . Hypothyroidism   . IBS (irritable bowel syndrome)   . Lumbar disc disease   . Mold exposure 2002   hx of   . Osteoporosis, post-menopausal   . Pneumonia 1978  . Restless legs   . Scoliosis   . Spinal stenosis   . Thyroid nodule    aspirated in 1997 and 2003 which were negative  . Ulcer   . Varicose veins of left lower extremity   . Vertigo   . Wears dentures      Past Surgical History:  Procedure Laterality Date  . ABDOMINAL HYSTERECTOMY  1977    Dr. Laverta Baltimore  . ANKLE FRACTURE SURGERY Left 2003   arch of foot.  no metal. per patient, she did not have surgery for this fracture. healed on its own  . ANTERIOR LAT LUMBAR FUSION Left 09/09/2016   Procedure: Left Lumbar two-three Anterolateral lumbar interbody fusion with lateral plate;  Surgeon: Erline Levine, MD;  Location: [place];  Service: Neurosurgery;  Laterality: Left;  Left L2-3 Anterolateral lumbar interbody fusion with lateral plate  . APPENDECTOMY    . BACK SURGERY  2013, 2016, 2018   lumbar lam, fusion, discectomy.titanium  . BIOPSY THYROID     x2  thyroid nodule ; Dr. Sharlet Salina and one by Dr. Sabra Heck  . BREAST BIOPSY Left 08/02/2019   coil clip-INVASIVE MAMMARY CARCINOMA  . BREAST CYST ASPIRATION  1970's  . BREAST LUMPECTOMY WITH RADIOFREQUENCY TAG IDENTIFICATION Left 07/25/2583   Procedure: BREAST LUMPECTOMY WITH RADIOFREQUENCY TAG IDENTIFICATION;  Surgeon: Ronny Bacon, MD;  Location: [facility];  Service: General;  Laterality: Left;  .  BREAST LUMPECTOMY WITH SENTINEL LYMPH NODE BIOPSY Left 08/12/2019   Procedure: BREAST LUMPECTOMY WITH SENTINEL LYMPH NODE BX;  Surgeon: Ronny Bacon, MD;  Location: [facility];  Service: General;  Laterality: Left;  . broken arm Left 1994   wrist fracture surgery, left.  metal has been replaced.  Marland Kitchen CARDIOVASCULAR STRESS TEST     2010  . COLONOSCOPY  2010  . COLONOSCOPY  07/21/2013   PHX OF CP/REPEAT 24YRS/[place]  . ESOPHAGOGASTRODUODENOSCOPY (EGD) WITH PROPOFOL N/A 09/22/2018   Procedure: ESOPHAGOGASTRODUODENOSCOPY (EGD) WITH PROPOFOL;  Surgeon: Toledo, Benay Pike, MD;  Location: [facility];  Service: Gastroenterology;  Laterality: N/A;  Patient to have Rapid COVID-19 day of procedure Kieth Brightly approved this)  . EYE SURGERY Bilateral    cataract surgery with lens implant  . FOREIGN BODY REMOVAL N/A 12/09/2019   Procedure: FOREIGN BODY REMOVAL ADULT;  Surgeon: Hessie Knows, MD;  Location: [facility];  Service: Orthopedics;  Laterality: N/A;  . INCISION  AND DRAINAGE Left 12/09/2019   Procedure: INCISION AND DRAINAGE;  Surgeon: Hessie Knows, MD;  Location: [facility];  Service: Orthopedics;  Laterality: Left;  . LACERATION REPAIR Bilateral 12/09/2019   Procedure: REPAIR MULTIPLE LACERATIONS;  Surgeon: Hessie Knows, MD;  Location: [facility];  Service: Orthopedics;  Laterality: Bilateral;  . LUMBAR LAMINECTOMY/DECOMPRESSION MICRODISCECTOMY  11/18/2011   Procedure: LUMBAR LAMINECTOMY/DECOMPRESSION MICRODISCECTOMY 2 LEVELS;  Surgeon: Erline Levine, MD;  Location: [place] [facility];  Service: Neurosurgery;  Laterality: N/A;  Lumbar Three-Four,  Lumbar Four-Five Decompressive Laminectomy  . MALONEY DILATION N/A 09/22/2018   Procedure: MALONEY DILATION;  Surgeon: Toledo, Benay Pike, MD;  Location: [facility];  Service: Gastroenterology;  Laterality: N/A;  . OVARIAN CYST SURGERY    . POSTERIOR FUSION LUMBAR SPINE  2016  . TOTAL THYROIDECTOMY    . TRANSTHORACIC ECHOCARDIOGRAM  2010  . upper and lower GI  2010    Social History   Socioeconomic History  . Marital status: Widowed    Spouse name: Not on file  . Number of children: Not on file  . Years of education: Not on file  . Highest education level: Not on file  Occupational History  . 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 Occupation: housekeeping & dining at [employer]  Tobacco Use  . Smoking status: Never Smoker  . Smokeless tobacco: Never Used  Vaping Use  . Vaping Use: Never used  Substance and Sexual Activity  . Alcohol use: Yes    Comment: socially  . Drug use: No  . Sexual activity: Not Currently  Other Topics Concern  . Not on file  Social History Narrative   Lives with Quillian Quince (significant other).   Social Determinants of Health   Financial Resource Strain:   . Difficulty of Paying Living Expenses: Not on file  Food Insecurity:   . Worried About Charity fundraiser in the Last Year: Not on file  . Ran Out of Food in the Last Year: Not on file    Transportation Needs:   . Lack of Transportation (Medical): Not on file  . Lack of Transportation (Non-Medical): Not on file  Physical Activity:   . Days of Exercise per Week: Not on file  . Minutes of Exercise per Session: Not on file  Stress:   . Feeling of Stress : Not on file  Social Connections:   . Frequency of Communication with Friends and Family: Not on file  . Frequency of Social Gatherings with Friends and  Family: Not on file  . Attends Religious Services: Not on file  . Active Member of Clubs or Organizations: Not on file  . Attends Archivist Meetings: Not on file  . Marital Status: Not on file  Intimate Partner Violence:   . Fear of Current or Ex-Partner: Not on file  . Emotionally Abused: Not on file  . Physically Abused: Not on file  . Sexually Abused: Not on file    Family History  Problem Relation Age of Onset  . Breast cancer Mother 46  . Thyroid cancer Mother   . Pancreatic cancer Brother   . Pancreatic cancer Sister   . Suicidality Father   . Prostate cancer Brother   . Colon cancer Brother   . Breast cancer Sister        x 2 times  . Heart attack Sister   . Ovarian cancer Maternal Aunt   . Breast cancer Paternal Aunt   . Lung cancer Paternal Uncle   . Breast cancer Other   . Colon cancer Maternal Aunt   . Pancreatic cancer Maternal Aunt      Current Outpatient Medications:  .  acidophilus (RISAQUAD) CAPS capsule, Take 1 capsule by mouth daily., Disp: , Rfl:  .  amLODipine (NORVASC) 5 MG tablet, Take 5 mg by mouth daily. Takes in the evening, Disp: , Rfl:  .  anastrozole (ARIMIDEX) 1 MG tablet, Take 1 tablet by mouth once daily, Disp: 30 tablet, Rfl: 0 .  atorvastatin (LIPITOR) 40 MG tablet, Take 40 mg by mouth at bedtime. , Disp: , Rfl:  .  Calcium Carbonate-Vitamin D (CALCIUM 600+D) 600-400 MG-UNIT tablet, Take 2 tablets by mouth daily., Disp: , Rfl:  .  chlorthalidone (HYGROTON) 25 MG tablet, Take 25 mg by mouth in the morning and  at bedtime. , Disp: , Rfl: 1 .  diphenhydrAMINE (SOMINEX) 25 MG tablet, Take 25 mg by mouth at bedtime. , Disp: , Rfl:  .  doxylamine, Sleep, (UNISOM) 25 MG tablet, Take 25 mg by mouth at bedtime., Disp: , Rfl:  .  fexofenadine (ALLEGRA) 180 MG tablet, Take 180 mg by mouth daily. , Disp: , Rfl:  .  fluticasone (FLONASE) 50 MCG/ACT nasal spray, Place 2 sprays into both nostrils daily as needed for allergies. , Disp: , Rfl:  .  GLUCOSAMINE-CHONDROITIN PO, Take 1 tablet by mouth daily. , Disp: , Rfl:  .  HYDROcodone-acetaminophen (NORCO/VICODIN) 5-325 MG tablet, Take 1-2 tablets by mouth every 4 (four) hours as needed for moderate pain (pain score 4-6)., Disp: 30 tablet, Rfl: 0 .  irbesartan (AVAPRO) 300 MG tablet, Take 300 mg by mouth daily. , Disp: , Rfl:  .  levothyroxine (SYNTHROID) 175 MCG tablet, Take 175 mcg by mouth every morning., Disp: , Rfl:  .  magnesium oxide (MAG-OX) 400 MG tablet, Take 400 mg by mouth daily., Disp: , Rfl:  .  Multiple Vitamin (MULTIVITAMIN WITH MINERALS) TABS, Take 1 tablet by mouth daily., Disp: , Rfl:  .  nabumetone (RELAFEN) 500 MG tablet, Take 500 mg by mouth 2 (two) times daily. , Disp: , Rfl:  .  omeprazole (PRILOSEC) 40 MG capsule, Take 40 mg by mouth daily before breakfast. , Disp: , Rfl:  .  vitamin E (VITAMIN E) 180 MG (400 UNITS) capsule, Take 400 Units by mouth daily., Disp: , Rfl:  .  acetaminophen (TYLENOL) 500 MG tablet, Take 1,000 mg by mouth every 6 (six) hours as needed. (Patient not taking: Reported on 12/20/2019), Disp: ,  Rfl:  .  cholecalciferol (VITAMIN D3) 25 MCG (1000 UNIT) tablet, Take 1,000 Units by mouth daily. (Patient not taking: Reported on 12/20/2019), Disp: , Rfl:  .  hydrochlorothiazide (HYDRODIURIL) 25 MG tablet, Take 25 mg by mouth 2 (two) times daily. (Patient not taking: Reported on 12/09/2019), Disp: , Rfl:   Physical exam:  Vitals:   12/20/19 1005  BP: 119/71  Pulse: 87  Resp: 16  Temp: (!) 96.8 F (36 C)  TempSrc: Tympanic   SpO2: 99%  Weight: 175 lb (79.4 kg)   Physical Exam Constitutional:      General: She is not in acute distress. Cardiovascular:     Rate and Rhythm: Normal rate and regular rhythm.     Heart sounds: Normal heart sounds.  Pulmonary:     Effort: Pulmonary effort is normal.     Breath sounds: Normal breath sounds.  Abdominal:     General: Bowel sounds are normal.     Palpations: Abdomen is soft.  Skin:    General: Skin is warm and dry.  Neurological:     Mental Status: She is alert and oriented to person, place, and time.      CMP Latest Ref Rng & Units 12/20/2019  Glucose 70 - 99 mg/dL 136(H)  BUN 8 - 23 mg/dL 39(H)  Creatinine 0.44 - 1.00 mg/dL 1.78(H)  Sodium 135 - 145 mmol/L 139  Potassium 3.5 - 5.1 mmol/L 4.0  Chloride 98 - 111 mmol/L 104  CO2 22 - 32 mmol/L 23  Calcium 8.9 - 10.3 mg/dL 9.1  Total Protein 6.5 - 8.1 g/dL 7.5  Total Bilirubin 0.3 - 1.2 mg/dL 1.3(H)  Alkaline Phos 38 - 126 U/L 79  AST 15 - 41 U/L 22  ALT 0 - 44 U/L 22   CBC Latest Ref Rng & Units 12/20/2019  WBC 4.0 - 10.5 K/uL 6.0  Hemoglobin 12.0 - 15.0 g/dL 10.5(L)  Hematocrit 36 - 46 % 32.3(L)  Platelets 150 - 400 K/uL 206    No images are attached to the encounter.  DG Tibia/Fibula Left  Result Date: 12/09/2019 CLINICAL DATA:  projectile laceration, eval fx. eval foreign body placement Ran over piece of metal while mowing which shot into her leg. EXAM: LEFT TIBIA AND FIBULA - 2 VIEW COMPARISON:  None. FINDINGS: Within the subcutaneous tissues of the distal medial lower leg is a 35.4 cm linear metallic foreign body. This is oriented anterior-posterior. No fracture of the lower leg. Generalized soft tissue edema distally. IMPRESSION: 1. Linear 65.6 cm metallic foreign body in the subcutaneous tissues of the distal medial lower leg. 2. No lower leg fracture. 3. Generalized soft tissue edema. Electronically Signed   By: Keith Rake M.D.   On: 12/09/2019 18:56     Assessment and plan- Patient  is a 79 y.o. 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  She is s/p adjuvant radiation therapy and currently on Arimidex and this is a routine follow-up visit  Patient is tolerating Arimidex along with calcium and vitamin D well without any significant side effects.  She will need to take hormone therapy for 5 years.  She was also seen by Dr. Gabriel Carina from endocrinology for her history of fragility fracture in the past as well as recent bone density scan which showed evidence of osteopenia.  She has been on Reclast in the past.  Option of Prolia was discussed with her given her  low GFR.  She would like to think about her Prolia injections.  If patient ultimately decides against the use of bisphosphonates I will consider switching her to tamoxifen to avoid any worsening of her bone health  I will see her back in 3 months time with CBC with differential and CMP.  She does have some anemia today and recently had a laceration of her left leg which may have partly contributed towards it.  I will check iron studies B12 and folate as well in 3 months Visit Diagnosis 1. Malignant neoplasm of 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, unspecified estrogen receptor status ([place])   2. Visit for monitoring Arimidex therapy   3. Osteopenia of left hip   4. Normocytic anemia      Dr. Randa Evens, MD, MPH [facility] [phone] 12/23/2019 8:37 AM

## 2019-12-26 DIAGNOSIS — M1990 Unspecified osteoarthritis, unspecified site: Secondary | ICD-10-CM | POA: Diagnosis not present

## 2019-12-26 DIAGNOSIS — M810 Age-related osteoporosis without current pathological fracture: Secondary | ICD-10-CM | POA: Diagnosis not present

## 2019-12-26 DIAGNOSIS — I129 Hypertensive chronic kidney disease with stage 1 through stage 4 chronic kidney disease, or unspecified chronic kidney disease: Secondary | ICD-10-CM | POA: Diagnosis not present

## 2019-12-26 DIAGNOSIS — M5136 Other intervertebral disc degeneration, lumbar region: Secondary | ICD-10-CM | POA: Diagnosis not present

## 2019-12-26 DIAGNOSIS — I890 Lymphedema, not elsewhere classified: Secondary | ICD-10-CM | POA: Diagnosis not present

## 2019-12-26 DIAGNOSIS — S81822D Laceration with foreign body, left lower leg, subsequent encounter: Secondary | ICD-10-CM | POA: Diagnosis not present

## 2019-12-26 DIAGNOSIS — M48061 Spinal stenosis, lumbar region without neurogenic claudication: Secondary | ICD-10-CM | POA: Diagnosis not present

## 2019-12-26 DIAGNOSIS — N1832 Chronic kidney disease, stage 3b: Secondary | ICD-10-CM | POA: Diagnosis not present

## 2019-12-26 DIAGNOSIS — S81811D Laceration without foreign body, right lower leg, subsequent encounter: Secondary | ICD-10-CM | POA: Diagnosis not present

## 2019-12-27 DIAGNOSIS — N1832 Chronic kidney disease, stage 3b: Secondary | ICD-10-CM | POA: Diagnosis not present

## 2019-12-27 DIAGNOSIS — I890 Lymphedema, not elsewhere classified: Secondary | ICD-10-CM | POA: Diagnosis not present

## 2019-12-27 DIAGNOSIS — M48061 Spinal stenosis, lumbar region without neurogenic claudication: Secondary | ICD-10-CM | POA: Diagnosis not present

## 2019-12-27 DIAGNOSIS — M810 Age-related osteoporosis without current pathological fracture: Secondary | ICD-10-CM | POA: Diagnosis not present

## 2019-12-27 DIAGNOSIS — M1990 Unspecified osteoarthritis, unspecified site: Secondary | ICD-10-CM | POA: Diagnosis not present

## 2019-12-27 DIAGNOSIS — S81822D Laceration with foreign body, left lower leg, subsequent encounter: Secondary | ICD-10-CM | POA: Diagnosis not present

## 2019-12-27 DIAGNOSIS — M5136 Other intervertebral disc degeneration, lumbar region: Secondary | ICD-10-CM | POA: Diagnosis not present

## 2019-12-27 DIAGNOSIS — I129 Hypertensive chronic kidney disease with stage 1 through stage 4 chronic kidney disease, or unspecified chronic kidney disease: Secondary | ICD-10-CM | POA: Diagnosis not present

## 2019-12-27 DIAGNOSIS — S81811D Laceration without foreign body, right lower leg, subsequent encounter: Secondary | ICD-10-CM | POA: Diagnosis not present

## 2019-12-28 DIAGNOSIS — N1832 Chronic kidney disease, stage 3b: Secondary | ICD-10-CM | POA: Diagnosis not present

## 2019-12-28 DIAGNOSIS — S81811D Laceration without foreign body, right lower leg, subsequent encounter: Secondary | ICD-10-CM | POA: Diagnosis not present

## 2019-12-28 DIAGNOSIS — I890 Lymphedema, not elsewhere classified: Secondary | ICD-10-CM | POA: Diagnosis not present

## 2019-12-28 DIAGNOSIS — M75101 Unspecified rotator cuff tear or rupture of right shoulder, not specified as traumatic: Secondary | ICD-10-CM | POA: Diagnosis not present

## 2019-12-28 DIAGNOSIS — M1990 Unspecified osteoarthritis, unspecified site: Secondary | ICD-10-CM | POA: Diagnosis not present

## 2019-12-28 DIAGNOSIS — Z9889 Other specified postprocedural states: Secondary | ICD-10-CM | POA: Diagnosis not present

## 2019-12-28 DIAGNOSIS — M5136 Other intervertebral disc degeneration, lumbar region: Secondary | ICD-10-CM | POA: Diagnosis not present

## 2019-12-28 DIAGNOSIS — I129 Hypertensive chronic kidney disease with stage 1 through stage 4 chronic kidney disease, or unspecified chronic kidney disease: Secondary | ICD-10-CM | POA: Diagnosis not present

## 2019-12-28 DIAGNOSIS — M48061 Spinal stenosis, lumbar region without neurogenic claudication: Secondary | ICD-10-CM | POA: Diagnosis not present

## 2019-12-28 DIAGNOSIS — S81822D Laceration with foreign body, left lower leg, subsequent encounter: Secondary | ICD-10-CM | POA: Diagnosis not present

## 2019-12-28 DIAGNOSIS — M810 Age-related osteoporosis without current pathological fracture: Secondary | ICD-10-CM | POA: Diagnosis not present

## 2020-01-03 ENCOUNTER — Telehealth: Payer: Self-pay | Admitting: *Deleted

## 2020-01-03 DIAGNOSIS — M810 Age-related osteoporosis without current pathological fracture: Secondary | ICD-10-CM | POA: Diagnosis not present

## 2020-01-03 DIAGNOSIS — M5136 Other intervertebral disc degeneration, lumbar region: Secondary | ICD-10-CM | POA: Diagnosis not present

## 2020-01-03 DIAGNOSIS — S81822D Laceration with foreign body, left lower leg, subsequent encounter: Secondary | ICD-10-CM | POA: Diagnosis not present

## 2020-01-03 DIAGNOSIS — I890 Lymphedema, not elsewhere classified: Secondary | ICD-10-CM | POA: Diagnosis not present

## 2020-01-03 DIAGNOSIS — I129 Hypertensive chronic kidney disease with stage 1 through stage 4 chronic kidney disease, or unspecified chronic kidney disease: Secondary | ICD-10-CM | POA: Diagnosis not present

## 2020-01-03 DIAGNOSIS — S81811D Laceration without foreign body, right lower leg, subsequent encounter: Secondary | ICD-10-CM | POA: Diagnosis not present

## 2020-01-03 DIAGNOSIS — N1832 Chronic kidney disease, stage 3b: Secondary | ICD-10-CM | POA: Diagnosis not present

## 2020-01-03 DIAGNOSIS — M48061 Spinal stenosis, lumbar region without neurogenic claudication: Secondary | ICD-10-CM | POA: Diagnosis not present

## 2020-01-03 DIAGNOSIS — M1990 Unspecified osteoarthritis, unspecified site: Secondary | ICD-10-CM | POA: Diagnosis not present

## 2020-01-03 NOTE — Telephone Encounter (Signed)
Call returned to patient and I got her voice mail, I left detailed message to stop Arimidex for 3 weeks and to call us back at the end of 3 weeks to update [place] on how she is doing

## 2020-01-03 NOTE — Telephone Encounter (Signed)
No arimidex for 3 weeks. Call us after that and let us know how she is doing

## 2020-01-03 NOTE — Telephone Encounter (Signed)
Patient called reporting that she is having joint pains as well aback, feet and right shoulder pain since starting Arimidex. She states that she can't move her right arm due to the pain. She has an appointment tomorrow with orhto and is going to ask for a cortisone injection to her right shoulder. She is asking what to do about the medicine with all her pain. Please advise

## 2020-01-04 DIAGNOSIS — M25511 Pain in right shoulder: Secondary | ICD-10-CM | POA: Diagnosis not present

## 2020-01-04 DIAGNOSIS — M75101 Unspecified rotator cuff tear or rupture of right shoulder, not specified as traumatic: Secondary | ICD-10-CM | POA: Diagnosis not present

## 2020-01-04 DIAGNOSIS — M12811 Other specific arthropathies, not elsewhere classified, right shoulder: Secondary | ICD-10-CM | POA: Diagnosis not present

## 2020-01-04 DIAGNOSIS — Z9889 Other specified postprocedural states: Secondary | ICD-10-CM | POA: Diagnosis not present

## 2020-01-12 DIAGNOSIS — I129 Hypertensive chronic kidney disease with stage 1 through stage 4 chronic kidney disease, or unspecified chronic kidney disease: Secondary | ICD-10-CM | POA: Diagnosis not present

## 2020-01-12 DIAGNOSIS — I890 Lymphedema, not elsewhere classified: Secondary | ICD-10-CM | POA: Diagnosis not present

## 2020-01-12 DIAGNOSIS — S81822D Laceration with foreign body, left lower leg, subsequent encounter: Secondary | ICD-10-CM | POA: Diagnosis not present

## 2020-01-12 DIAGNOSIS — M1990 Unspecified osteoarthritis, unspecified site: Secondary | ICD-10-CM | POA: Diagnosis not present

## 2020-01-12 DIAGNOSIS — M48061 Spinal stenosis, lumbar region without neurogenic claudication: Secondary | ICD-10-CM | POA: Diagnosis not present

## 2020-01-12 DIAGNOSIS — M810 Age-related osteoporosis without current pathological fracture: Secondary | ICD-10-CM | POA: Diagnosis not present

## 2020-01-12 DIAGNOSIS — S81811D Laceration without foreign body, right lower leg, subsequent encounter: Secondary | ICD-10-CM | POA: Diagnosis not present

## 2020-01-12 DIAGNOSIS — M5136 Other intervertebral disc degeneration, lumbar region: Secondary | ICD-10-CM | POA: Diagnosis not present

## 2020-01-12 DIAGNOSIS — N1832 Chronic kidney disease, stage 3b: Secondary | ICD-10-CM | POA: Diagnosis not present

## 2020-01-14 ENCOUNTER — Other Ambulatory Visit: Payer: Self-pay | Admitting: Oncology

## 2020-01-18 DIAGNOSIS — I129 Hypertensive chronic kidney disease with stage 1 through stage 4 chronic kidney disease, or unspecified chronic kidney disease: Secondary | ICD-10-CM | POA: Diagnosis not present

## 2020-01-18 DIAGNOSIS — N1832 Chronic kidney disease, stage 3b: Secondary | ICD-10-CM | POA: Diagnosis not present

## 2020-01-18 DIAGNOSIS — I890 Lymphedema, not elsewhere classified: Secondary | ICD-10-CM | POA: Diagnosis not present

## 2020-01-18 DIAGNOSIS — M5136 Other intervertebral disc degeneration, lumbar region: Secondary | ICD-10-CM | POA: Diagnosis not present

## 2020-01-18 DIAGNOSIS — S81811D Laceration without foreign body, right lower leg, subsequent encounter: Secondary | ICD-10-CM | POA: Diagnosis not present

## 2020-01-18 DIAGNOSIS — S81822D Laceration with foreign body, left lower leg, subsequent encounter: Secondary | ICD-10-CM | POA: Diagnosis not present

## 2020-01-18 DIAGNOSIS — M1990 Unspecified osteoarthritis, unspecified site: Secondary | ICD-10-CM | POA: Diagnosis not present

## 2020-01-18 DIAGNOSIS — M48061 Spinal stenosis, lumbar region without neurogenic claudication: Secondary | ICD-10-CM | POA: Diagnosis not present

## 2020-01-18 DIAGNOSIS — M810 Age-related osteoporosis without current pathological fracture: Secondary | ICD-10-CM | POA: Diagnosis not present

## 2020-01-22 DIAGNOSIS — M722 Plantar fascial fibromatosis: Secondary | ICD-10-CM | POA: Diagnosis not present

## 2020-01-27 ENCOUNTER — Ambulatory Visit
Admission: RE | Admit: 2020-01-27 | Discharge: 2020-01-27 | Disposition: A | Discharge status: Home / Self Care | Payer: Medicare HMO | Source: Ambulatory Visit | Attending: Surgery | Admitting: Surgery

## 2020-01-27 ENCOUNTER — Other Ambulatory Visit: Payer: Self-pay

## 2020-01-27 DIAGNOSIS — C50312 Malignant neoplasm of 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: Secondary | ICD-10-CM | POA: Insufficient documentation

## 2020-01-27 DIAGNOSIS — R922 Inconclusive mammogram: Secondary | ICD-10-CM | POA: Diagnosis not present

## 2020-01-27 HISTORY — DX: Personal history of irradiation: Z92.3

## 2020-02-02 ENCOUNTER — Ambulatory Visit (INDEPENDENT_AMBULATORY_CARE_PROVIDER_SITE_OTHER): Payer: Medicare HMO | Admitting: Surgery

## 2020-02-02 ENCOUNTER — Encounter: Payer: Self-pay | Admitting: Surgery

## 2020-02-02 ENCOUNTER — Other Ambulatory Visit: Payer: Self-pay

## 2020-02-02 VITALS — BP 138/82 | HR 106 | Temp 98.4°F | Ht 63.0 in | Wt 175.4 lb

## 2020-02-02 DIAGNOSIS — C50312 Malignant neoplasm of 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: Secondary | ICD-10-CM

## 2020-02-02 NOTE — Progress Notes (Signed)
Surgical Clinic Progress/Follow-up Note   HPI:  79 y.o. Female presents to clinic for left breast cancer follow-up.  She reports no issues with her left breast.  She does have multiple other issues involving an injury of her legs sustained from a wire ejected from a lawnmower.  She is also suffering from her arthritic issues, involving her right shoulder with some residual swelling of the right arm.   Other than that she appears to have tolerated her radiation treatment well, but reports she was recently taken off her antihormonal regimen due to some of her joint pain.  Apparently Dr. Janese Banks her oncologist felt it was worthwhile with holding this in the interim to see if it was to blame.  Review of Systems:  Constitutional: denies fever/chills  ENT: denies sore throat, hearing problems  Respiratory: denies shortness of breath, wheezing  Cardiovascular: denies chest pain, palpitations  Gastrointestinal: denies abdominal pain, N/V, or diarrhea Skin: Denies any other rashes or skin discolorations  Vital Signs:  BP 138/82   Pulse (!) 106   Temp 98.4 F (36.9 C) (Oral)   Ht 5\' 3"  (1.6 m)   Wt 175 lb 6.4 oz (79.6 kg)   SpO2 93%   BMI 31.07 kg/m    Physical Exam:  Constitutional:  -- Normal/Obese body habitus  -- Awake, alert, and oriented x3  Pulmonary:  -- No crackles -- Equal breath sounds bilaterally -- Breathing non-labored at rest Cardiovascular:  -- S1, S2 present  -- No pericardial rubs  Gastrointestinal:  -- Soft and non-distended, non-tender/with no tenderness to palpation, no guarding/rebound tenderness  GU  --bilateral breast exam unremarkable for any skin changes, suspicious or dominant nodularity.  Her left central-areolar scar is well-healed Musculoskeletal / Integumentary:  -- Wounds or skin discoloration: None appreciated  -- Extremities: She has a bit of resolving swelling from the medial aspect of her right upper arm, diminished range of motion of the right shoulder  as expected. Bilateral lower extremity edema, complicated by her recent trauma without evidence of erythema, unhealed wound or ulcer.  Laboratory studies: Radiology review:   Imaging:  CLINICAL DATA:  History of treated left breast cancer, status post breast conservation therapy.  EXAM: DIGITAL DIAGNOSTIC BILATERAL MAMMOGRAM WITH CAD AND TOMO  COMPARISON:  Previous exam(s).  ACR Breast Density Category c: The breast tissue is heterogeneously dense, which may obscure small masses.  FINDINGS: Mammographically, there are no suspicious masses, areas of nonsurgical architectural distortion or microcalcifications in either breast. Expected posttreatment changes in the left breast lower inner quadrant.  Mammographic images were processed with CAD.  IMPRESSION: No mammographic evidence of malignancy in either breast status post left lumpectomy.  RECOMMENDATION: Diagnostic mammogram is suggested in 1 year. (Code:DM-B-01Y)  I have discussed the findings and recommendations with the patient. If applicable, a reminder letter will be sent to the patient regarding the next appointment.  BI-RADS CATEGORY  2: Benign.   Electronically Signed   By: Fidela Salisbury M.D.   On: 01/27/2020 14:26   Assessment:  79 y.o. yo Female with a problem list including...  Patient Active Problem List   Diagnosis Date Noted  . Laceration of left leg 12/09/2019  . Genetic testing 08/29/2019  . Family history of breast cancer   . Family history of ovarian cancer   . Family history of pancreatic cancer   . Family history of prostate cancer   . Family history of colon cancer   . Family history of thyroid cancer   .  Goals of care, counseling/discussion 08/12/2019  . Malignant neoplasm of 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 ([place]) 08/04/2019  . Hyperlipidemia 02/03/2018  . Esophageal stenosis 01/28/2018  . Lymphedema 10/14/2017  . Hypertension 08/19/2017  . Swelling of limb  08/19/2017  . Pain in limb 08/19/2017  . DOE (dyspnea on exertion) 07/29/2017  . Non-seasonal allergic rhinitis 07/29/2017  . Varicose veins of left leg with edema 07/29/2017  . Dysphagia, oropharyngeal 04/30/2017  . Lumbar degenerative disc disease 04/30/2017  . Osteopenia of multiple sites 04/30/2017  . Spinal stenosis of lumbar region 12/08/2016  . Lumbar compression fracture ([place]) 12/02/2016  . 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 Chronic midline low back pain with right-sided sciatica 07/28/2016  . Gastroesophageal reflux disease without esophagitis 07/27/2015  . Intermittent vertigo 07/27/2015  . 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 Seasonal allergic rhinitis due to pollen 07/27/2015  . Acquired hypothyroidism 01/05/2015  . Lumbar scoliosis 10/27/2014  . Benign essential hypertension 06/29/2014  . Mixed hyperlipidemia 06/29/2014  . Insomnia 11/11/2013  . Mild reactive airways disease 11/11/2013  . Bilateral sciatica 09/29/2013    presents to clinic for follow-up evaluation of left breast cancer, progressing well.  Plan:              - return to clinic in 1 year with follow-up imaging, or as needed, instructed to call office if any questions or concerns  -She is to continue follow-up with her rheumatologist, and primary care. She discussed concerns about starting a new medication that she is unfamiliar with and would like to pursue a second opinion.      Ronny Bacon, MD, FACS [place]: [facility]. Office: [phone]

## 2020-02-02 NOTE — Patient Instructions (Addendum)
We will reach out to you in Oct 2022 to schedule your bilateral mammogram and breast exam for Nov 2022. Rheumatologist Mayur Posey [facility] at [facility] Breast self-awareness is knowing how your breasts look and feel. Doing breast self-awareness is important. It allows you to catch a breast problem early while it is still small and can be treated. All women should do breast self-awareness, including women who have had breast implants. Tell your doctor if you notice a change in your breasts. What you need:  A mirror.  A well-lit room. How to do a breast self-exam A breast self-exam is one way to learn what is normal for your breasts and to check for changes. To do a breast self-exam: Look for changes  1. Take off all the clothes above your waist. 2. Stand in front of a mirror in a room with good lighting. 3. Put your hands on your hips. 4. Push your hands down. 5. Look at your breasts and nipples in the mirror to see if one breast or nipple looks different from the other. Check to see if: ? The shape of one breast is different. ? The size of one breast is different. ? There are wrinkles, dips, and bumps in one breast and not the other. 6. Look at each breast for changes in the skin, such as: ? Redness. ? Scaly areas. 7. Look for changes in your nipples, such as: ? Liquid around the nipples. ? Bleeding. ? Dimpling. ? Redness. ? A change in where the nipples are. Feel for changes  1. Lie on your back on the floor. 2. Feel each breast. To do this, follow these steps: ? Pick a breast to feel. ? Put the arm closest to that breast above your head. ? Use your other arm to feel the nipple area of your breast. Feel the area with the pads of your three middle fingers by making small circles with your fingers. For the first circle, press lightly. For the second circle, press harder. For the third circle, press even harder. ? Keep making circles with your fingers at the  different pressures as you move down your breast. Stop when you feel your ribs. ? Move your fingers a little toward the center of your body. ? Start making circles with your fingers again, this time going up until you reach your collarbone. ? Keep making up-and-down circles until you reach your armpit. Remember to keep using the three pressures. ? Feel the other breast in the same way. 3. Sit or stand in the tub or shower. 4. With soapy water on your skin, feel each breast the same way you did in step 2 when you were lying on the floor. Write down what you find Writing down what you find can help you remember what to tell your doctor. Write down:  What is normal for each breast.  Any changes you find in each breast, including: ? The kind of changes you find. ? Whether you have pain. ? Size and location of any lumps.  When you last had your menstrual period. General tips  Check your breasts every month.  If you are breastfeeding, the best time to check your breasts is after you feed your baby or after you use a breast pump.  If you get menstrual periods, the best time to check your breasts is 5-7 days after your menstrual period is over.  With time, you will become comfortable with the self-exam, and you will  begin to know if there are changes in your breasts. Contact a doctor if you:  See a change in the shape or size of your breasts or nipples.  See a change in the skin of your breast or nipples, such as red or scaly skin.  Have fluid coming from your nipples that is not normal.  Find a lump or thick area that was not there before.  Have pain in your breasts.  Have any concerns about your breast health. Summary  Breast self-awareness includes looking for changes in your breasts, as well as feeling for changes within your breasts.  Breast self-awareness should be done in front of a mirror in a well-lit room.  You should check your breasts every month. If you get  menstrual periods, the best time to check your breasts is 5-7 days after your menstrual period is over.  Let your doctor know of any changes you see in your breasts, including changes in size, changes on the skin, pain or tenderness, or fluid from your nipples that is not normal. This information is not intended to replace advice given to you by your health care provider. Make sure you discuss any questions you have with your health care provider. Document Revised: 10/20/2017 Document Reviewed: 10/20/2017 Elsevier Patient Education  [place].

## 2020-02-13 DIAGNOSIS — S46211A Strain of muscle, fascia and tendon of other parts of biceps, right arm, initial encounter: Secondary | ICD-10-CM | POA: Diagnosis not present

## 2020-02-13 DIAGNOSIS — Z23 Encounter for immunization: Secondary | ICD-10-CM | POA: Diagnosis not present

## 2020-02-22 DIAGNOSIS — M66821 Spontaneous rupture of other tendons, right upper arm: Secondary | ICD-10-CM | POA: Diagnosis not present

## 2020-03-06 DIAGNOSIS — M8949 Other hypertrophic osteoarthropathy, multiple sites: Secondary | ICD-10-CM | POA: Diagnosis not present

## 2020-03-06 DIAGNOSIS — N1832 Chronic kidney disease, stage 3b: Secondary | ICD-10-CM | POA: Diagnosis not present

## 2020-03-06 DIAGNOSIS — M48061 Spinal stenosis, lumbar region without neurogenic claudication: Secondary | ICD-10-CM | POA: Diagnosis not present

## 2020-03-06 DIAGNOSIS — E039 Hypothyroidism, unspecified: Secondary | ICD-10-CM | POA: Diagnosis not present

## 2020-03-13 DIAGNOSIS — N1832 Chronic kidney disease, stage 3b: Secondary | ICD-10-CM | POA: Diagnosis not present

## 2020-03-13 DIAGNOSIS — E039 Hypothyroidism, unspecified: Secondary | ICD-10-CM | POA: Diagnosis not present

## 2020-03-13 DIAGNOSIS — E782 Mixed hyperlipidemia: Secondary | ICD-10-CM | POA: Diagnosis not present

## 2020-03-13 DIAGNOSIS — I129 Hypertensive chronic kidney disease with stage 1 through stage 4 chronic kidney disease, or unspecified chronic kidney disease: Secondary | ICD-10-CM | POA: Diagnosis not present

## 2020-03-22 ENCOUNTER — Inpatient Hospital Stay: Payer: Medicare Other | Admitting: Oncology

## 2020-03-22 ENCOUNTER — Inpatient Hospital Stay: Payer: Medicare Other | Attending: Oncology

## 2020-03-22 ENCOUNTER — Encounter: Payer: Self-pay | Admitting: Oncology

## 2020-03-22 ENCOUNTER — Other Ambulatory Visit: Payer: Self-pay

## 2020-03-22 VITALS — BP 145/84 | HR 72 | Temp 96.9°F | Resp 16 | Ht 63.0 in | Wt 179.6 lb

## 2020-03-22 DIAGNOSIS — Z853 Personal history of malignant neoplasm of breast: Secondary | ICD-10-CM

## 2020-03-22 DIAGNOSIS — D649 Anemia, unspecified: Secondary | ICD-10-CM | POA: Diagnosis not present

## 2020-03-22 DIAGNOSIS — Z170 Estrogen receptor positive status [ER+]: Secondary | ICD-10-CM | POA: Insufficient documentation

## 2020-03-22 DIAGNOSIS — C50312 Malignant neoplasm of 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: Secondary | ICD-10-CM

## 2020-03-22 DIAGNOSIS — Z79811 Long term (current) use of aromatase inhibitors: Secondary | ICD-10-CM

## 2020-03-22 DIAGNOSIS — Z08 Encounter for follow-up examination after completed treatment for malignant neoplasm: Secondary | ICD-10-CM

## 2020-03-22 DIAGNOSIS — C50912 Malignant neoplasm of unspecified site of left female breast: Secondary | ICD-10-CM | POA: Insufficient documentation

## 2020-03-22 DIAGNOSIS — Z5181 Encounter for therapeutic drug level monitoring: Secondary | ICD-10-CM | POA: Diagnosis not present

## 2020-03-22 LAB — CBC WITH DIFFERENTIAL/PLATELET
Abs Immature Granulocytes: 0.03 10*3/uL (ref 0.00–0.07)
Basophils Absolute: 0.1 10*3/uL (ref 0.0–0.1)
Basophils Relative: 1 %
Eosinophils Absolute: 0.2 10*3/uL (ref 0.0–0.5)
Eosinophils Relative: 3 %
HCT: 38.7 % (ref 36.0–46.0)
Hemoglobin: 12.5 g/dL (ref 12.0–15.0)
Immature Granulocytes: 1 %
Lymphocytes Relative: 21 %
Lymphs Abs: 1.3 10*3/uL (ref 0.7–4.0)
MCH: 28.7 pg (ref 26.0–34.0)
MCHC: 32.3 g/dL (ref 30.0–36.0)
MCV: 89 fL (ref 80.0–100.0)
Monocytes Absolute: 0.4 10*3/uL (ref 0.1–1.0)
Monocytes Relative: 7 %
Neutro Abs: 4.1 10*3/uL (ref 1.7–7.7)
Neutrophils Relative %: 67 %
Platelets: 184 10*3/uL (ref 150–400)
RBC: 4.35 MIL/uL (ref 3.87–5.11)
RDW: 14.1 % (ref 11.5–15.5)
WBC: 6 10*3/uL (ref 4.0–10.5)
nRBC: 0 % (ref 0.0–0.2)

## 2020-03-22 LAB — IRON AND TIBC
Iron: 67 ug/dL (ref 28–170)
Saturation Ratios: 18 % (ref 10.4–31.8)
TIBC: 374 ug/dL (ref 250–450)
UIBC: 307 ug/dL

## 2020-03-22 LAB — COMPREHENSIVE METABOLIC PANEL
ALT: 23 U/L (ref 0–44)
AST: 24 U/L (ref 15–41)
Albumin: 4 g/dL (ref 3.5–5.0)
Alkaline Phosphatase: 77 U/L (ref 38–126)
Anion gap: 7 (ref 5–15)
BUN: 31 mg/dL — ABNORMAL HIGH (ref 8–23)
CO2: 28 mmol/L (ref 22–32)
Calcium: 9.2 mg/dL (ref 8.9–10.3)
Chloride: 104 mmol/L (ref 98–111)
Creatinine, Ser: 1.58 mg/dL — ABNORMAL HIGH (ref 0.44–1.00)
GFR, Estimated: 33 mL/min — ABNORMAL LOW (ref >60)
Glucose, Bld: 111 mg/dL — ABNORMAL HIGH (ref 70–99)
Potassium: 3.6 mmol/L (ref 3.5–5.1)
Sodium: 139 mmol/L (ref 135–145)
Total Bilirubin: 1.1 mg/dL (ref 0.3–1.2)
Total Protein: 7.3 g/dL (ref 6.5–8.1)

## 2020-03-22 LAB — FERRITIN: Ferritin: 31 ng/mL (ref 11–307)

## 2020-03-22 LAB — FOLATE: Folate: 42 ng/mL (ref >5.9)

## 2020-03-22 LAB — VITAMIN B12: Vitamin B-12: 463 pg/mL (ref 180–914)

## 2020-03-22 NOTE — Progress Notes (Signed)
Pt has no breast concerns. She had a wire go into her left leg in sept and had surgery and it is still healing. She did come off the anastrozole for a little over 1 month.

## 2020-03-22 NOTE — Progress Notes (Signed)
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 She then noticed possible nipple discharge from her right breast which prompted a diagnostic right breast mammogram which was essentially unremarkable. MRI was recommended and patient underwent a bilateral MRI which did not show any concerning findings in the right breast where she had the nipple discharge but showed an enhancing mass in the inner left breast measuring 0.9 x 0.6 x 0.7 cm which was suspicious and biopsy. Biopsy was consistent with invasive mammary carcinoma grade 2 6 mm ER greater than 90% positive PR greater than 90% positive and HER-2 negative.  Family history significant for breast cancer in her maternal grandmother at the age of 62. Sister had breast cancer in her 3s. Another sister had pancreatic cancer at 37. Brother with pancreatic cancer. Another brother with prostate and colon cancer.Genetic testing negative  Final pathology showed7 mm grade 2 invasive mammary carcinoma with positive unifocal anterior margin. 1 sentinel lymph node negative for malignancy. PT1BPN0 tumor was greater than  90% ER positive PR positive and HER-2 negative   Interval history-patient met with endocrinology and will likely proceed with Prolia in the next 2 to 3 months.  Tolerating Arimidex well without any significant side effects.  She did have some joint pain including foot and shoulder pain for a while.  She took a break from Arimidex and then restarted it and tolerating it well so far  ECOG PS- 1 Pain scale- 0  Review of systems- Review of Systems  Constitutional: Positive for malaise/fatigue. Negative for chills, fever and weight loss.  HENT: Negative for congestion, ear discharge and nosebleeds.   Eyes: Negative for blurred vision.  Respiratory: Negative for cough, hemoptysis, sputum production, shortness of breath and wheezing.   Cardiovascular: Negative for chest pain, palpitations, orthopnea and claudication.  Gastrointestinal: Negative for abdominal pain, blood in stool, constipation, diarrhea, heartburn, melena, nausea and vomiting.  Genitourinary: Negative for dysuria, flank pain, frequency, hematuria and urgency.  Musculoskeletal: Negative for back pain, joint pain and myalgias.  Skin: Negative for rash.  Neurological: Negative for dizziness, tingling, focal weakness, seizures, weakness and headaches.  Endo/Heme/Allergies: Does not bruise/bleed easily.  Psychiatric/Behavioral: Negative for depression and suicidal ideas. The patient does not have insomnia.       Allergies  Allergen Reactions  . Flagyl [Metronidazole Hcl] Swelling    Swelling in face, mouth, throat, palms of feet and hands.  . Furosemide Itching    Made side of mouth irritated and puffy.  The rim inside her lip swelled up  . Macrodantin [Nitrofurantoin Macrocrystal] Anaphylaxis, Hives and Swelling  . Penicillins Anaphylaxis and Swelling    PATIENT HAD A PCN REACTION WITH IMMEDIATE RASH, FACIAL/TONGUE/THROAT SWELLING, SOB, OR LIGHTHEADEDNESS WITH HYPOTENSION:  #  #  #  YES  #  #  #  Has patient had a PCN reaction  causing severe rash involving mucus membranes or skin necrosis: No Has patient had a PCN reaction that required hospitalization: No Has patient had a PCN reaction occurring within the last 10 years: No If all of the above answers are "NO", then may proceed with Cephalosporin use.   . Celebrex [Celecoxib] Swelling    Swelling in joints.  Did not agree with her. Did not help her either  . Other     Pt says it was recently discovered she allergic to an ingredient in local anesthesia but she can't recall the name. (epinephrine and the additive used for the biopsy. Gets very nervous. Feels internally shaking. 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 Allergic rhinitis   . Anemia    low iron  . Anxiety   . Arthritis 07/2019   osteoporosis  . Asthma without status asthmaticus    seasonal, not often  . Bilateral sciatica   . Breast cancer ([place]) 07/2019   invasive mammary cancer , grade 2  . Bronchitis   . Cancer ([place])     follicular variant papillary thyroid cancer  . Chronic back pain    unspecified  . Chronic kidney disease    ckd stage 3b  . Colon polyp    adenomatous  . DDD (degenerative disc disease), lumbar   . DJD (degenerative joint disease)   . Dysphagia   . Dyspnea   . Environmental allergies   . Family history of breast cancer   . Family history of colon cancer   . Family history of ovarian cancer   . Family history of pancreatic cancer   . Family history of prostate cancer   . Family history of thyroid cancer   . GERD (gastroesophageal reflux disease)   . Headache    migraines as a young woman  . History of recurrent UTIs   . Hyperlipidemia   . Hypertension   . Hypothyroidism   . IBS (irritable bowel syndrome)   . Lumbar disc disease   . Mold exposure 2002   hx of   . Osteoporosis, post-menopausal   . Personal history of radiation therapy   . Pneumonia 1978  . Restless legs   .  Scoliosis   . Spinal stenosis   . Thyroid nodule    aspirated in 1997 and 2003 which were negative  . Ulcer   . Varicose veins of left lower extremity   . Vertigo   . Wears dentures      Past Surgical History:  Procedure Laterality Date  . ABDOMINAL HYSTERECTOMY  1977   Dr. Laverta Baltimore  . ANKLE FRACTURE SURGERY Left 2003   arch of foot.  no metal. per patient, she did not have surgery for this fracture. healed on its own  . ANTERIOR LAT LUMBAR FUSION Left 09/09/2016   Procedure: Left Lumbar two-three Anterolateral lumbar interbody fusion with lateral plate;  Surgeon: Erline Levine, MD;  Location: [place];  Service: Neurosurgery;  Laterality: Left;  Left L2-3 Anterolateral lumbar interbody fusion with lateral plate  . APPENDECTOMY    . BACK SURGERY  2013, 2016, 2018   lumbar lam, fusion, discectomy.titanium  . BIOPSY THYROID     x2  thyroid nodule ; Dr. Sharlet Salina and one by Dr. Sabra Heck  . BREAST BIOPSY Left 08/02/2019   coil clip-INVASIVE MAMMARY CARCINOMA  . BREAST CYST ASPIRATION  1970's  . BREAST LUMPECTOMY Left 2021  . BREAST LUMPECTOMY WITH RADIOFREQUENCY TAG IDENTIFICATION Left 09/11/3149   Procedure: BREAST LUMPECTOMY WITH RADIOFREQUENCY TAG IDENTIFICATION;  Surgeon: Ronny Bacon, MD;  Location: [facility];  Service: General;  Laterality: Left;  . BREAST LUMPECTOMY WITH SENTINEL LYMPH NODE BIOPSY Left 08/12/2019   Procedure: BREAST LUMPECTOMY WITH SENTINEL LYMPH NODE BX;  Surgeon: Ronny Bacon, MD;  Location: [facility];  Service: General;  Laterality: Left;  . broken arm Left 1994   wrist fracture surgery, left.  metal has been replaced.  Marland Kitchen CARDIOVASCULAR STRESS TEST     2010  . COLONOSCOPY  2010  . COLONOSCOPY  07/21/2013   PHX OF CP/REPEAT 44YRS/[place]  . ESOPHAGOGASTRODUODENOSCOPY (EGD) WITH PROPOFOL N/A 09/22/2018   Procedure: ESOPHAGOGASTRODUODENOSCOPY (EGD) WITH PROPOFOL;  Surgeon: Toledo, Benay Pike, MD;  Location: [facility];  Service: Gastroenterology;  Laterality: N/A;   Patient to have Rapid COVID-19 day of procedure Kieth Brightly approved this)  . EYE SURGERY Bilateral    cataract surgery with lens implant  . FOREIGN BODY REMOVAL N/A 12/09/2019   Procedure: FOREIGN BODY REMOVAL ADULT;  Surgeon: Hessie Knows, MD;  Location: [facility];  Service: Orthopedics;  Laterality: N/A;  . INCISION AND DRAINAGE Left 12/09/2019   Procedure: INCISION AND DRAINAGE;  Surgeon: Hessie Knows, MD;  Location: [facility];  Service: Orthopedics;  Laterality: Left;  . LACERATION REPAIR Bilateral 12/09/2019   Procedure: REPAIR MULTIPLE LACERATIONS;  Surgeon: Hessie Knows, MD;  Location: [facility];  Service: Orthopedics;  Laterality: Bilateral;  . LUMBAR LAMINECTOMY/DECOMPRESSION MICRODISCECTOMY  11/18/2011   Procedure: LUMBAR LAMINECTOMY/DECOMPRESSION MICRODISCECTOMY 2 LEVELS;  Surgeon: Erline Levine, MD;  Location: [place] [facility];  Service: Neurosurgery;  Laterality: N/A;  Lumbar Three-Four,  Lumbar Four-Five Decompressive Laminectomy  . MALONEY DILATION N/A 09/22/2018   Procedure: MALONEY DILATION;  Surgeon: Toledo, Benay Pike, MD;  Location: [facility];  Service: Gastroenterology;  Laterality: N/A;  . OVARIAN CYST SURGERY    . POSTERIOR FUSION LUMBAR SPINE  2016  . TOTAL THYROIDECTOMY    . TRANSTHORACIC ECHOCARDIOGRAM  2010  . upper and lower GI  2010    Social History   Socioeconomic History  . Marital status: Widowed    Spouse name: Not on file  . Number of children: Not on file  . Years of education: Not on file  . Highest education level: Not on file  Occupational History  . 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 Occupation: housekeeping & dining at [employer]  Tobacco Use  . Smoking status: Never Smoker  . Smokeless tobacco: Never Used  Vaping Use  . Vaping Use: Never used  Substance and Sexual Activity  . Alcohol use: Yes    Comment: socially-rare  . Drug use: No  . Sexual activity: Not Currently  Other Topics Concern  . Not on file   Social History Narrative   Lives with Quillian Quince (significant other).   Social Determinants of Health   Financial Resource Strain: Not on file  Food Insecurity: Not on file  Transportation Needs: Not on file  Physical Activity: Not on file  Stress: Not on file  Social Connections: Not on file  Intimate Partner Violence: Not on file    Family History  Problem Relation Age of Onset  . Breast cancer Mother 35  . Thyroid cancer Mother   . Pancreatic cancer Brother   . Pancreatic cancer Sister   . Suicidality Father   . Prostate cancer Brother   .  Colon cancer Brother   . Breast cancer Sister        x 2 times  . Heart attack Sister   . Ovarian cancer Maternal Aunt   . Breast cancer Paternal Aunt   . Lung cancer Paternal Uncle   . Breast cancer Other   . Colon cancer Maternal Aunt   . Pancreatic cancer Maternal Aunt      Current Outpatient Medications:  .  acetaminophen (TYLENOL) 500 MG tablet, Take 1,000 mg by mouth every 6 (six) hours as needed. , Disp: , Rfl:  .  acidophilus (RISAQUAD) CAPS capsule, Take 1 capsule by mouth daily., Disp: , Rfl:  .  amLODipine (NORVASC) 5 MG tablet, Take 5 mg by mouth daily. Takes in the evening, Disp: , Rfl:  .  anastrozole (ARIMIDEX) 1 MG tablet, Take 1 tablet by mouth once daily, Disp: 30 tablet, Rfl: 0 .  atorvastatin (LIPITOR) 40 MG tablet, Take 40 mg by mouth at bedtime. , Disp: , Rfl:  .  Calcium Carbonate-Vitamin D 600-400 MG-UNIT tablet, Take 2 tablets by mouth daily., Disp: , Rfl:  .  chlorthalidone (HYGROTON) 25 MG tablet, Take 25 mg by mouth in the morning and at bedtime. , Disp: , Rfl: 1 .  cholecalciferol (VITAMIN D3) 25 MCG (1000 UNIT) tablet, Take 1,000 Units by mouth daily. , Disp: , Rfl:  .  diphenhydrAMINE (SOMINEX) 25 MG tablet, Take 25 mg by mouth at bedtime., Disp: , Rfl:  .  doxylamine, Sleep, (UNISOM) 25 MG tablet, Take 25 mg by mouth at bedtime., Disp: , Rfl:  .  fexofenadine (ALLEGRA) 180 MG tablet, Take 180 mg by  mouth daily., Disp: , Rfl:  .  fluticasone (FLONASE) 50 MCG/ACT nasal spray, Place 2 sprays into both nostrils daily as needed for allergies. , Disp: , Rfl:  .  GLUCOSAMINE-CHONDROITIN PO, Take 1 tablet by mouth daily. , Disp: , Rfl:  .  irbesartan (AVAPRO) 300 MG tablet, Take 300 mg by mouth daily., Disp: , Rfl:  .  levothyroxine (SYNTHROID) 175 MCG tablet, Take 175 mcg by mouth every morning., Disp: , Rfl:  .  magnesium oxide (MAG-OX) 400 MG tablet, Take 400 mg by mouth daily., Disp: , Rfl:  .  Multiple Vitamin (MULTIVITAMIN WITH MINERALS) TABS, Take 1 tablet by mouth daily., Disp: , Rfl:  .  nabumetone (RELAFEN) 500 MG tablet, Take 500 mg by mouth 2 (two) times daily. , Disp: , Rfl:  .  naproxen sodium (ALEVE) 220 MG tablet, Take 220 mg by mouth daily as needed., Disp: , Rfl:  .  omeprazole (PRILOSEC) 40 MG capsule, Take 40 mg by mouth daily before breakfast., Disp: , Rfl:  .  vitamin E 180 MG (400 UNITS) capsule, Take 400 Units by mouth daily., Disp: , Rfl:   Physical exam:  Vitals:   03/22/20 1010  BP: (!) 145/84  Pulse: 72  Resp: 16  Temp: (!) 96.9 F (36.1 C)  TempSrc: Tympanic  Weight: 179 lb 9.6 oz (81.5 kg)  Height: _0  (1.6 m)   Physical Exam Constitutional:      General: She is not in acute distress. Eyes:     Extraocular Movements: EOM normal.  Cardiovascular:     Rate and Rhythm: Normal rate and regular rhythm.     Heart sounds: Normal heart sounds.  Pulmonary:     Effort: Pulmonary effort is normal.     Breath sounds: Normal breath sounds.  Skin:    General: Skin is warm and  dry.  Neurological:     Mental Status: She is alert and oriented to person, place, and time.      CMP Latest Ref Rng & Units 03/22/2020  Glucose 70 - 99 mg/dL 111(H)  BUN 8 - 23 mg/dL 31(H)  Creatinine 0.44 - 1.00 mg/dL 1.58(H)  Sodium 135 - 145 mmol/L 139  Potassium 3.5 - 5.1 mmol/L 3.6  Chloride 98 - 111 mmol/L 104  CO2 22 - 32 mmol/L 28  Calcium 8.9 - 10.3 mg/dL 9.2  Total  Protein 6.5 - 8.1 g/dL 7.3  Total Bilirubin 0.3 - 1.2 mg/dL 1.1  Alkaline Phos 38 - 126 U/L 77  AST 15 - 41 U/L 24  ALT 0 - 44 U/L 23   CBC Latest Ref Rng & Units 03/22/2020  WBC 4.0 - 10.5 K/uL 6.0  Hemoglobin 12.0 - 15.0 g/dL 12.5  Hematocrit 36.0 - 46.0 % 38.7  Platelets 150 - 400 K/uL 184    Assessment and plan- Patient is a 80 y.o. 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  She is on Arimidex presently and this is a routine follow-up visit   Patient is tolerating Arimidex well without any significant side effects.  We discussed that she does have osteopenia and Prolia would be a good consideration for her.  She would likely start Prolia with endocrinology in the next 2 to 3 months.  If she ultimately decides not to take Prolia I will consider switching her to tamoxifen.  Normocytic anemia: Now resolved.  Iron studies and folate were normal.  B12 is pending  I will see her back in 4 months.  No labs   Visit Diagnosis 1. Visit for monitoring Arimidex therapy   2. Encounter for follow-up surveillance of breast cancer      Dr. Randa Evens, MD, MPH [facility] at [facility] [phone] 03/22/2020 1:09 PM

## 2020-05-02 ENCOUNTER — Ambulatory Visit: Payer: Medicare Other | Admitting: Radiation Oncology

## 2020-06-08 ENCOUNTER — Encounter: Payer: Self-pay | Admitting: Radiation Oncology

## 2020-06-08 ENCOUNTER — Other Ambulatory Visit: Payer: Self-pay

## 2020-06-08 ENCOUNTER — Ambulatory Visit
Admission: RE | Admit: 2020-06-08 | Discharge: 2020-06-08 | Disposition: A | Discharge status: Home / Self Care | Payer: Medicare Other | Source: Ambulatory Visit | Attending: Radiation Oncology | Admitting: Radiation Oncology

## 2020-06-08 VITALS — BP 144/85 | HR 87 | Temp 96.4°F | Wt 182.9 lb

## 2020-06-08 DIAGNOSIS — C50312 Malignant neoplasm of 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: Secondary | ICD-10-CM

## 2020-06-08 DIAGNOSIS — C50012 Malignant neoplasm of nipple and areola, left female breast: Secondary | ICD-10-CM

## 2020-06-08 NOTE — Progress Notes (Signed)
Radiation Oncology Follow up Note  Name: Latoya Stevenson   Date:   06/08/2020 MRN:  [id number] DOB: 12-15-1940    This 80 y.o. female presents to the clinic today for 40-month follow-up status post whole breast radiation to her left breast for stage Ia (T1b N0 M0) invasive mammary carcinoma ER/PR positive.  REFERRING PROVIDER: Baxter Hire, MD  HPI: Patient is a 80 year old female now at 6 months having completed whole breast radiation to her left breast for ER/PR positive stage I invasive mammary carcinoma seen today in routine follow-up she is doing well.  She specifically denies breast tenderness cough or bone pain..  She had mammograms in November which I have reviewed were BI-RADS 2 benign.  She is currently on Arimidex tolerating it well without side effect  COMPLICATIONS OF TREATMENT: none  FOLLOW UP COMPLIANCE: keeps appointments   PHYSICAL EXAM:  BP (!) 144/85   Pulse 87   Temp (!) 96.4 F (35.8 C) (Tympanic)   Wt 182 lb 14.4 oz (83 kg)   BMI 32.40 kg/m  Lungs are clear to A&P cardiac examination essentially unremarkable with regular rate and rhythm. No dominant mass or nodularity is noted in either breast in 2 positions examined. Incision is well-healed. No axillary or supraclavicular adenopathy is appreciated. Cosmetic result is excellent.  Well-developed well-nourished patient in NAD. HEENT reveals PERLA, EOMI, discs not visualized.  Oral cavity is clear. No oral mucosal lesions are identified. Neck is clear without evidence of cervical or supraclavicular adenopathy. Lungs are clear to A&P. Cardiac examination is essentially unremarkable with regular rate and rhythm without murmur rub or thrill. Abdomen is benign with no organomegaly or masses noted. Motor sensory and DTR levels are equal and symmetric in the upper and lower extremities. Cranial nerves II through XII are grossly intact. Proprioception is intact. No peripheral adenopathy or edema is identified. No motor or  sensory levels are noted. Crude visual fields are within normal range.  RADIOLOGY RESULTS: Mammograms reviewed compatible with above-stated findings PLAN: At the present time patient is doing well with no evidence of disease 6 months out and pleased with her overall progress.  She continues on Arimidex without side effect.  I have asked to see her back in 6 months for follow-up and then will start once year follow-up visits.  Patient knows to call with any concerns.  I would like to take this opportunity to thank you for allowing me to participate in the care of your patient.Noreene Filbert, MD

## 2020-06-08 NOTE — Progress Notes (Signed)
Survivorship Care Plan visit completed.  Treatment summary reviewed and given to patient.  ASCO answers booklet reviewed and given to patient.  CARE program and Cancer Transitions discussed with patient along with other resources cancer center offers to patients and caregivers.  Patient verbalized understanding.    

## 2020-06-22 ENCOUNTER — Telehealth: Payer: Self-pay

## 2020-06-22 NOTE — Telephone Encounter (Signed)
06/22/2020-called patient to asked which medications needed refilled, she stated none at the moment, she just wanted to up date  Her pharmacy. [facility]

## 2020-06-22 NOTE — Telephone Encounter (Signed)
-----   Message from Secundino Ginger sent at 06/22/2020 10:46 AM EDT ----- Regarding: RX refill This precious lady called and is trying to get her medications switched to [employer] order. She would love for you to call her about this. Fax number is [phone]

## 2020-06-26 ENCOUNTER — Other Ambulatory Visit: Payer: Self-pay | Admitting: *Deleted

## 2020-06-26 MED ORDER — ANASTROZOLE 1 MG PO TABS: 1.0000 mg | ORAL_TABLET | Freq: Every day | ORAL | 0 refills | Status: DC

## 2020-07-20 ENCOUNTER — Other Ambulatory Visit: Payer: Self-pay

## 2020-07-20 ENCOUNTER — Inpatient Hospital Stay: Payer: Medicare Other | Attending: Oncology | Admitting: Oncology

## 2020-07-20 VITALS — BP 138/85 | HR 83 | Temp 96.0°F | Resp 20 | Wt 177.3 lb

## 2020-07-20 DIAGNOSIS — Z853 Personal history of malignant neoplasm of breast: Secondary | ICD-10-CM

## 2020-07-20 DIAGNOSIS — Z79811 Long term (current) use of aromatase inhibitors: Secondary | ICD-10-CM | POA: Diagnosis not present

## 2020-07-20 DIAGNOSIS — Z5181 Encounter for therapeutic drug level monitoring: Secondary | ICD-10-CM | POA: Diagnosis not present

## 2020-07-20 DIAGNOSIS — C50312 Malignant neoplasm of 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: Secondary | ICD-10-CM

## 2020-07-20 DIAGNOSIS — C50912 Malignant neoplasm of unspecified site of left female breast: Secondary | ICD-10-CM | POA: Insufficient documentation

## 2020-07-20 DIAGNOSIS — Z08 Encounter for follow-up examination after completed treatment for malignant neoplasm: Secondary | ICD-10-CM | POA: Diagnosis not present

## 2020-07-20 DIAGNOSIS — M810 Age-related osteoporosis without current pathological fracture: Secondary | ICD-10-CM | POA: Diagnosis not present

## 2020-07-20 DIAGNOSIS — Z170 Estrogen receptor positive status [ER+]: Secondary | ICD-10-CM | POA: Insufficient documentation

## 2020-07-21 ENCOUNTER — Encounter: Payer: Self-pay | Admitting: Oncology

## 2020-07-21 NOTE — Progress Notes (Signed)
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 She then noticed possible nipple discharge from her right breast which prompted a diagnostic right breast mammogram which was essentially unremarkable. MRI was recommended and patient underwent a bilateral MRI which did not show any concerning findings in the right breast where she had the nipple discharge but showed an enhancing mass in the inner left breast measuring 0.9 x 0.6 x 0.7 cm which was suspicious and biopsy. Biopsy was consistent with invasive mammary carcinoma grade 2 6 mm ER greater than 90% positive PR greater than 90% positive and HER-2 negative. Genetic testing negative  Final pathology showed7 mm grade 2 invasive mammary carcinoma with positive unifocal anterior margin. 1 sentinel lymph node negative for malignancy. PT1BPN0 tumor was greater than 90% ER positive PR positive and HER-2 negative  Patient completed adjuvant radiation therapy and started Arimidex in October 2021.    Interval history-patient reports tolerating Arimidex well for the most part except for some self-limited fatigue and joint pains.   She reports an area of concern over her left breast.  ECOG PS- 1 Pain scale- 0   Review of systems- Review of Systems  Constitutional: Positive for malaise/fatigue. Negative for chills, fever and weight loss.  HENT: Negative for congestion, ear discharge and nosebleeds.   Eyes: Negative for blurred vision.  Respiratory: Negative for cough, hemoptysis, sputum production, shortness of breath and wheezing.   Cardiovascular: Negative for chest pain, palpitations, orthopnea and claudication.  Gastrointestinal: Negative for abdominal pain, blood in stool, constipation, diarrhea, heartburn, melena, nausea and vomiting.  Genitourinary: Negative for dysuria, flank pain, frequency, hematuria and urgency.  Musculoskeletal: Negative for back pain, joint pain and myalgias.  Skin: Negative for rash.  Neurological: Negative for dizziness, tingling, focal weakness, seizures, weakness and headaches.  Endo/Heme/Allergies: Does not bruise/bleed easily.  Psychiatric/Behavioral: Negative for depression and suicidal ideas. The patient does not have insomnia.       Allergies  Allergen Reactions  . Flagyl [Metronidazole Hcl] Swelling    Swelling in face, mouth, throat, palms of feet and hands.  . Furosemide Itching    Made side of mouth irritated and puffy.  The rim inside her lip swelled up  . Macrodantin [Nitrofurantoin Macrocrystal] Anaphylaxis, Hives and Swelling  . Penicillins Anaphylaxis and Swelling    PATIENT HAD A PCN REACTION WITH IMMEDIATE RASH, FACIAL/TONGUE/THROAT SWELLING, SOB, OR LIGHTHEADEDNESS WITH HYPOTENSION:  #  #  #  YES  #  #  #   Has patient had a PCN reaction causing severe rash involving mucus membranes or skin necrosis: No Has patient had a PCN reaction that required hospitalization: No Has patient had a PCN reaction occurring within the last 10 years: No If all of the above answers are "NO", then may proceed with  Cephalosporin use.   . Celebrex [Celecoxib] Swelling    Swelling  in joints.  Did not agree with her. Did not help her either  . Other     Pt says it was recently discovered she allergic to an ingredient in local anesthesia but she can't recall the name. (epinephrine and the additive used for the biopsy. Gets very nervous. Feels internally shaking. 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 Allergic rhinitis   . Anemia    low iron  . Anxiety   . Arthritis 07/2019   osteoporosis  . Asthma without status asthmaticus    seasonal, not often  . Bilateral sciatica   . Breast cancer ([place]) 07/2019   invasive mammary cancer , grade 2  . Bronchitis   . Cancer ([place])     follicular variant papillary thyroid cancer  . Chronic back pain    unspecified  . Chronic kidney disease    ckd stage 3b  . Colon polyp    adenomatous  . DDD (degenerative disc disease), lumbar   . DJD (degenerative joint disease)   . Dysphagia   . Dyspnea   . Environmental allergies   . Family history of breast cancer   . Family history of colon cancer   . Family history of ovarian cancer   . Family history of pancreatic cancer   . Family history of prostate cancer   . Family history of thyroid cancer   . GERD (gastroesophageal reflux disease)   . Headache    migraines as a young woman  . History of recurrent UTIs   . Hyperlipidemia   . Hypertension   . Hypothyroidism   . IBS (irritable bowel syndrome)   . Lumbar disc disease   . Mold exposure 2002   hx of   . Osteoporosis, post-menopausal   . Personal history of radiation therapy   . Pneumonia 1978  . Restless legs   . Scoliosis   . Spinal stenosis   . Thyroid nodule    aspirated in 1997 and 2003 which were negative  . Ulcer   . Varicose veins of left lower extremity   . Vertigo   . Wears dentures      Past Surgical History:  Procedure Laterality Date  . ABDOMINAL HYSTERECTOMY  1977   Dr. Laverta [place]  . ANKLE FRACTURE SURGERY Left 2003    arch of foot.  no metal. per patient, she did not have surgery for this fracture. healed on its own  . ANTERIOR LAT LUMBAR FUSION Left 09/09/2016   Procedure: Left Lumbar two-three Anterolateral lumbar interbody fusion with lateral plate;  Surgeon: Erline Levine, MD;  Location: [facility];  Service: Neurosurgery;  Laterality: Left;  Left L2-3 Anterolateral lumbar interbody fusion with lateral plate  . APPENDECTOMY    . BACK SURGERY  2013, 2016, 2018   lumbar lam, fusion, discectomy.titanium  . BIOPSY THYROID     x2  thyroid nodule ; Dr. Sharlet Salina and one by Dr. Sabra Heck  . BREAST BIOPSY Left 08/02/2019   coil clip-INVASIVE MAMMARY CARCINOMA  . BREAST CYST ASPIRATION  1970's  . BREAST LUMPECTOMY Left 2021  . BREAST LUMPECTOMY WITH RADIOFREQUENCY TAG IDENTIFICATION Left 06/07/5571   Procedure: BREAST LUMPECTOMY WITH RADIOFREQUENCY TAG IDENTIFICATION;  Surgeon: Ronny Bacon, MD;  Location: [facility];  Service: General;  Laterality: Left;  . BREAST LUMPECTOMY WITH SENTINEL LYMPH NODE BIOPSY Left  08/12/2019   Procedure: BREAST LUMPECTOMY WITH SENTINEL LYMPH NODE BX;  Surgeon: Ronny Bacon, MD;  Location: [facility];  Service: General;  Laterality: Left;  . broken arm Left 1994   wrist fracture surgery, left.  metal has been replaced.  Marland Kitchen CARDIOVASCULAR STRESS TEST     2010  . COLONOSCOPY  2010  . COLONOSCOPY  07/21/2013   PHX OF CP/REPEAT 89YRS/OH  . ESOPHAGOGASTRODUODENOSCOPY (EGD) WITH PROPOFOL N/A 09/22/2018   Procedure: ESOPHAGOGASTRODUODENOSCOPY (EGD) WITH PROPOFOL;  Surgeon: Toledo, Benay Pike, MD;  Location: [facility];  Service: Gastroenterology;  Laterality: N/A;  Patient to have Rapid COVID-19 day of procedure Kieth Brightly approved this)  . EYE SURGERY Bilateral    cataract surgery with lens implant  . FOREIGN BODY REMOVAL N/A 12/09/2019   Procedure: FOREIGN BODY REMOVAL ADULT;  Surgeon: Hessie Knows, MD;  Location: [facility];  Service: Orthopedics;  Laterality: N/A;  . INCISION AND DRAINAGE Left  12/09/2019   Procedure: INCISION AND DRAINAGE;  Surgeon: Hessie Knows, MD;  Location: [facility];  Service: Orthopedics;  Laterality: Left;  . LACERATION REPAIR Bilateral 12/09/2019   Procedure: REPAIR MULTIPLE LACERATIONS;  Surgeon: Hessie Knows, MD;  Location: [facility];  Service: Orthopedics;  Laterality: Bilateral;  . LUMBAR LAMINECTOMY/DECOMPRESSION MICRODISCECTOMY  11/18/2011   Procedure: LUMBAR LAMINECTOMY/DECOMPRESSION MICRODISCECTOMY 2 LEVELS;  Surgeon: Erline Levine, MD;  Location: [facility];  Service: Neurosurgery;  Laterality: N/A;  Lumbar Three-Four,  Lumbar Four-Five Decompressive Laminectomy  . MALONEY DILATION N/A 09/22/2018   Procedure: MALONEY DILATION;  Surgeon: Toledo, Benay Pike, MD;  Location: [facility];  Service: Gastroenterology;  Laterality: N/A;  . OVARIAN CYST SURGERY    . POSTERIOR FUSION LUMBAR SPINE  2016  . TOTAL THYROIDECTOMY    . TRANSTHORACIC ECHOCARDIOGRAM  2010  . upper and lower GI  2010    Social History   Socioeconomic History  . Marital status: Widowed    Spouse name: Not on file  . Number of children: Not on file  . Years of education: Not on file  . Highest education level: Not on file  Occupational History  . 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 Occupation: housekeeping & dining at [employer]  Tobacco Use  . Smoking status: Never Smoker  . Smokeless tobacco: Never Used  Vaping Use  . Vaping Use: Never used  Substance and Sexual Activity  . Alcohol use: Yes    Comment: socially-rare  . Drug use: No  . Sexual activity: Not Currently  Other Topics Concern  . Not on file  Social History Narrative   Lives with Quillian Quince (significant other).   Social Determinants of Health   Financial Resource Strain: Not on file  Food Insecurity: Not on file  Transportation Needs: Not on file  Physical Activity: Not on file  Stress: Not on file  Social Connections: Not on file  Intimate Partner Violence: Not on file     Family History  Problem Relation Age of Onset  . Breast cancer Mother 61  . Thyroid cancer Mother   . Pancreatic cancer Brother   . Pancreatic cancer Sister   . Suicidality Father   . Prostate cancer Brother   . Colon cancer Brother   . Breast cancer Sister        x 2 times  . Heart attack Sister   . Ovarian cancer Maternal Aunt   . Breast cancer Paternal Aunt   . Lung cancer Paternal Uncle   .  Breast cancer Other   . Colon cancer Maternal Aunt   . Pancreatic cancer Maternal Aunt      Current Outpatient Medications:  .  amLODipine (NORVASC) 5 MG tablet, Take 5 mg by mouth daily. Takes in the evening, Disp: , Rfl:  .  anastrozole (ARIMIDEX) 1 MG tablet, Take 1 tablet (1 mg total) by mouth daily., Disp: 90 tablet, Rfl: 0 .  atorvastatin (LIPITOR) 40 MG tablet, Take 40 mg by mouth at bedtime. , Disp: , Rfl:  .  Calcium Carbonate-Vitamin D 600-400 MG-UNIT tablet, Take 2 tablets by mouth daily., Disp: , Rfl:  .  chlorthalidone (HYGROTON) 25 MG tablet, Take 25 mg by mouth in the morning and at bedtime. , Disp: , Rfl: 1 .  cholecalciferol (VITAMIN D3) 25 MCG (1000 UNIT) tablet, Take 1,000 Units by mouth daily. , Disp: , Rfl:  .  denosumab (PROLIA) 60 MG/ML SOSY injection, Inject 60 mg into the skin every 6 (six) months., Disp: , Rfl:  .  GLUCOSAMINE-CHONDROITIN PO, Take 1 tablet by mouth daily. , Disp: , Rfl:  .  irbesartan (AVAPRO) 300 MG tablet, Take 300 mg by mouth daily., Disp: , Rfl:  .  levothyroxine (SYNTHROID) 175 MCG tablet, Take 175 mcg by mouth every morning., Disp: , Rfl:  .  Multiple Vitamin (MULTIVITAMIN WITH MINERALS) TABS, Take 1 tablet by mouth daily., Disp: , Rfl:  .  omeprazole (PRILOSEC) 40 MG capsule, Take 40 mg by mouth daily before breakfast., Disp: , Rfl:  .  vitamin E 180 MG (400 UNITS) capsule, Take 400 Units by mouth daily., Disp: , Rfl:  .  fexofenadine (ALLEGRA) 180 MG tablet, Take 180 mg by mouth daily. (Patient not taking: Reported on 07/20/2020), Disp:  , Rfl:  .  fluticasone (FLONASE) 50 MCG/ACT nasal spray, Place 2 sprays into both nostrils daily as needed for allergies.  (Patient not taking: Reported on 07/20/2020), Disp: , Rfl:  .  gabapentin (NEURONTIN) 100 MG capsule, Take 100 mg by mouth 2 (two) times daily., Disp: , Rfl:  .  gabapentin (NEURONTIN) 300 MG capsule, Take by mouth., Disp: , Rfl:   Physical exam:  Vitals:   07/20/20 0925  BP: 138/85  Pulse: 83  Resp: 20  Temp: (!) 96 F (35.6 C)  TempSrc: Tympanic  SpO2: 100%  Weight: 177 lb 4.8 oz (80.4 kg)   Physical Exam Constitutional:      General: She is not in acute distress. Cardiovascular:     Rate and Rhythm: Normal rate and regular rhythm.     Heart sounds: Normal heart sounds.  Pulmonary:     Effort: Pulmonary effort is normal.     Breath sounds: Normal breath sounds.  Abdominal:     General: Bowel sounds are normal.     Palpations: Abdomen is soft.  Skin:    General: Skin is warm and dry.  Neurological:     Mental Status: She is alert and oriented to person, place, and time.    Breast exam was performed in seated and lying down position. Patient is status post left lumpectomy with a well-healed surgical scar. No evidence of any palpable masses. No evidence of axillary adenopathy. No evidence of any palpable masses or lumps in the right breast. No evidence of right axillary adenopathy.  There is a mild area of superficial redness noted over the skin of the left areola likely local irritation with no underlying palpable breast mass   CMP Latest Ref Rng & Units 03/22/2020  Glucose 70 - 99 mg/dL 111(H)  BUN 8 - 23 mg/dL 31(H)  Creatinine 0.44 - 1.00 mg/dL 1.58(H)  Sodium 135 - 145 mmol/L 139  Potassium 3.5 - 5.1 mmol/L 3.6  Chloride 98 - 111 mmol/L 104  CO2 22 - 32 mmol/L 28  Calcium 8.9 - 10.3 mg/dL 9.2  Total Protein 6.5 - 8.1 g/dL 7.3  Total Bilirubin 0.3 - 1.2 mg/dL 1.1  Alkaline Phos 38 - 126 U/L 77  AST 15 - 41 U/L 24  ALT 0 - 44 U/L 23   CBC Latest  Ref Rng & Units 03/22/2020  WBC 4.0 - 10.5 K/uL 6.0  Hemoglobin 12.0 - 15.0 g/dL 12.5  Hematocrit 36.0 - 46.0 % 38.7  Platelets 150 - 400 K/uL 184    No images are attached to the encounter.  No results found.   Assessment and plan- Patient is a 80 y.o. female with stage I left breast cancer s/p lumpectomy adjuvant radiation therapy and currently on Arimidex here for routine follow-up  Patient is tolerating Arimidex well without any significant side effects. She has baseline osteoporosis and sees endocrinology and has been receiving Prolia every 6 months.  The area of concern in the left breast there is no palpable mass but there appears to be an area of superficial skin irritation which does not seem to be causing any pruritus or discharge.  We could consider low potency steroid if symptoms continue  I will see her back in 6 months   Visit Diagnosis 1. Encounter for follow-up surveillance of breast cancer   2. Visit for monitoring Arimidex therapy      Dr. Randa Evens, MD, MPH [facility] at [facility] [phone] 07/21/2020 9:03 PM

## 2020-08-01 ENCOUNTER — Telehealth: Payer: Self-pay | Admitting: Oncology

## 2020-08-01 ENCOUNTER — Other Ambulatory Visit: Payer: Self-pay | Admitting: Oncology

## 2020-08-01 MED ORDER — ANASTROZOLE 1 MG PO TABS: 1.0000 mg | ORAL_TABLET | Freq: Every day | ORAL | 3 refills | Status: DC

## 2020-08-01 NOTE — Telephone Encounter (Addendum)
Patient left message with patient relations stating she was expecting a refill on her medication for her breast to be sent to [facility] in [place] and it was still not there as of yesterday.  I reached out to Dr. Janese Banks and clinical team as the previous prescription was sent to mail order on 4/12 as a three month supply.  Dr. Janese Banks refilled the prescription and sent it to [facility] in [place] at 1213 today.  Called patient to notify her and had to leave a message.  Left my name and number for call back.  1:45pm  Patient returned call and states she did not realize the prescription was sent to mail order.  She will go by [facility] around 3 pm to pick up the prescription.

## 2020-08-02 ENCOUNTER — Other Ambulatory Visit: Payer: Self-pay

## 2020-08-27 ENCOUNTER — Other Ambulatory Visit: Payer: Self-pay | Admitting: *Deleted

## 2020-08-27 MED ORDER — ANASTROZOLE 1 MG PO TABS: 1.0000 mg | ORAL_TABLET | Freq: Every day | ORAL | 2 refills | Status: DC

## 2020-12-12 ENCOUNTER — Other Ambulatory Visit: Payer: Self-pay

## 2020-12-12 DIAGNOSIS — C50312 Malignant neoplasm of 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: Secondary | ICD-10-CM

## 2020-12-13 ENCOUNTER — Ambulatory Visit
Admission: RE | Admit: 2020-12-13 | Discharge: 2020-12-13 | Disposition: A | Discharge status: Home / Self Care | Payer: Medicare Other | Source: Ambulatory Visit | Attending: Radiation Oncology | Admitting: Radiation Oncology

## 2020-12-13 ENCOUNTER — Encounter: Payer: Self-pay | Admitting: Radiation Oncology

## 2020-12-13 VITALS — BP 138/81 | HR 73 | Temp 97.6°F | Resp 16 | Wt 167.2 lb

## 2020-12-13 DIAGNOSIS — C50012 Malignant neoplasm of nipple and areola, left female breast: Secondary | ICD-10-CM

## 2020-12-13 NOTE — Progress Notes (Signed)
Radiation Oncology Follow up Note  Name: Latoya Stevenson   Date:   12/13/2020 MRN:  [id number] DOB: Sep 24, 1940    This 80 y.o. female presents to the clinic today for 1 year follow-up status post whole breast radiation to her left breast for stage Ia (T1b N0 M0) invasive mammary carcinoma ER/PR positive.  REFERRING PROVIDER: Baxter Hire, MD  HPI: Patient is a 81 year old female now out 1 year having completed whole breast radiation to her left breast for stage Ia ER/PR positive invasive mammary carcinoma.  Seen today in routine follow-up she is doing fairly well.  She does have questions about her bone density and her medication will have asked her to address those to the prescribing physician.  She specifically denies breast tenderness cough or bone pain..  She mammograms back in November which I have reviewed were BI-RADS 2 benign.  She is currently on Arimidex tolerating it well without side effect.  COMPLICATIONS OF TREATMENT: none  FOLLOW UP COMPLIANCE: keeps appointments   PHYSICAL EXAM:  BP 138/81 (BP Location: Right Arm, Patient Position: Sitting, Cuff Size: Normal)   Pulse 73   Temp 97.6 F (36.4 C) (Tympanic)   Resp 16   Wt 167 lb 3.2 oz (75.8 kg)   SpO2 100%   BMI 29.62 kg/m  Lungs are clear to A&P cardiac examination essentially unremarkable with regular rate and rhythm. No dominant mass or nodularity is noted in either breast in 2 positions examined. Incision is well-healed. No axillary or supraclavicular adenopathy is appreciated. Cosmetic result is excellent.  Well-developed well-nourished patient in NAD. HEENT reveals PERLA, EOMI, discs not visualized.  Oral cavity is clear. No oral mucosal lesions are identified. Neck is clear without evidence of cervical or supraclavicular adenopathy. Lungs are clear to A&P. Cardiac examination is essentially unremarkable with regular rate and rhythm without murmur rub or thrill. Abdomen is benign with no organomegaly or masses noted.  Motor sensory and DTR levels are equal and symmetric in the upper and lower extremities. Cranial nerves II through XII are grossly intact. Proprioception is intact. No peripheral adenopathy or edema is identified. No motor or sensory levels are noted. Crude visual fields are within normal range.  RADIOLOGY RESULTS: Mammograms reviewed compatible with above-stated findings  PLAN: Present time patient is doing well with no evidence of disease 1 year out from whole breast radiation and pleased with her overall progress.  She continues on Arimidex without side effect.  She will have follow-up mammograms in November I have asked her to contact me should know 1 of ordered those.  Otherwise of asked to see her back in 1 year for follow-up.  Patient knows to call with any concerns.  I would like to take this opportunity to thank you for allowing me to participate in the care of your patient.Noreene Filbert, MD

## 2021-01-21 ENCOUNTER — Other Ambulatory Visit: Payer: Self-pay

## 2021-01-21 ENCOUNTER — Inpatient Hospital Stay: Payer: Medicare Other | Attending: Oncology

## 2021-01-21 ENCOUNTER — Inpatient Hospital Stay: Payer: Medicare Other | Admitting: Oncology

## 2021-01-21 ENCOUNTER — Encounter: Payer: Self-pay | Admitting: Oncology

## 2021-01-21 VITALS — BP 128/83 | HR 82 | Temp 98.8°F | Resp 16 | Ht 63.0 in | Wt 167.9 lb

## 2021-01-21 DIAGNOSIS — Z08 Encounter for follow-up examination after completed treatment for malignant neoplasm: Secondary | ICD-10-CM

## 2021-01-21 DIAGNOSIS — Z79811 Long term (current) use of aromatase inhibitors: Secondary | ICD-10-CM | POA: Diagnosis not present

## 2021-01-21 DIAGNOSIS — Z170 Estrogen receptor positive status [ER+]: Secondary | ICD-10-CM | POA: Insufficient documentation

## 2021-01-21 DIAGNOSIS — Z5181 Encounter for therapeutic drug level monitoring: Secondary | ICD-10-CM | POA: Diagnosis not present

## 2021-01-21 DIAGNOSIS — C50912 Malignant neoplasm of unspecified site of left female breast: Secondary | ICD-10-CM | POA: Diagnosis not present

## 2021-01-21 DIAGNOSIS — Z853 Personal history of malignant neoplasm of breast: Secondary | ICD-10-CM | POA: Diagnosis not present

## 2021-01-21 LAB — COMPREHENSIVE METABOLIC PANEL
ALT: 15 U/L (ref 0–44)
AST: 18 U/L (ref 15–41)
Albumin: 3.8 g/dL (ref 3.5–5.0)
Alkaline Phosphatase: 74 U/L (ref 38–126)
Anion gap: 8 (ref 5–15)
BUN: 25 mg/dL — ABNORMAL HIGH (ref 8–23)
CO2: 25 mmol/L (ref 22–32)
Calcium: 8.6 mg/dL — ABNORMAL LOW (ref 8.9–10.3)
Chloride: 104 mmol/L (ref 98–111)
Creatinine, Ser: 1.42 mg/dL — ABNORMAL HIGH (ref 0.44–1.00)
GFR, Estimated: 38 mL/min — ABNORMAL LOW (ref >60)
Glucose, Bld: 103 mg/dL — ABNORMAL HIGH (ref 70–99)
Potassium: 3.9 mmol/L (ref 3.5–5.1)
Sodium: 137 mmol/L (ref 135–145)
Total Bilirubin: 0.9 mg/dL (ref 0.3–1.2)
Total Protein: 6.9 g/dL (ref 6.5–8.1)

## 2021-01-21 LAB — CBC WITH DIFFERENTIAL/PLATELET
Abs Immature Granulocytes: 0.02 10*3/uL (ref 0.00–0.07)
Basophils Absolute: 0 10*3/uL (ref 0.0–0.1)
Basophils Relative: 1 %
Eosinophils Absolute: 0.2 10*3/uL (ref 0.0–0.5)
Eosinophils Relative: 3 %
HCT: 37.7 % (ref 36.0–46.0)
Hemoglobin: 11.9 g/dL — ABNORMAL LOW (ref 12.0–15.0)
Immature Granulocytes: 0 %
Lymphocytes Relative: 22 %
Lymphs Abs: 1 10*3/uL (ref 0.7–4.0)
MCH: 27.9 pg (ref 26.0–34.0)
MCHC: 31.6 g/dL (ref 30.0–36.0)
MCV: 88.5 fL (ref 80.0–100.0)
Monocytes Absolute: 0.7 10*3/uL (ref 0.1–1.0)
Monocytes Relative: 15 %
Neutro Abs: 2.7 10*3/uL (ref 1.7–7.7)
Neutrophils Relative %: 59 %
Platelets: 150 10*3/uL (ref 150–400)
RBC: 4.26 MIL/uL (ref 3.87–5.11)
RDW: 14.1 % (ref 11.5–15.5)
WBC: 4.6 10*3/uL (ref 4.0–10.5)
nRBC: 0 % (ref 0.0–0.2)

## 2021-01-21 LAB — VITAMIN D 25 HYDROXY (VIT D DEFICIENCY, FRACTURES): Vit D, 25-Hydroxy: 32.16 ng/mL (ref 30–100)

## 2021-01-21 NOTE — Progress Notes (Signed)
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  She then noticed possible nipple discharge from her right breast which prompted a diagnostic right breast mammogram which was essentially unremarkable.  MRI was recommended and patient underwent a bilateral MRI which did not show any concerning findings in the right breast where she had the nipple discharge but showed an enhancing mass in the inner left breast measuring 0.9 x 0.6 x 0.7 cm which was suspicious and biopsy. Biopsy was consistent with invasive mammary carcinoma grade 2 6 mm ER greater than 90% positive PR greater than 90% positive and HER-2 negative.   Genetic testing negative   Final pathology showed 7 mm grade 2 invasive mammary carcinoma with positive unifocal anterior margin.  1 sentinel lymph node negative for malignancy.  PT1BPN0 tumor was greater than 90% ER positive PR positive and HER-2 negative   Patient completed adjuvant radiation therapy and started Arimidex in October 2021.   Interval history-tolerating Arimidex well without any side effects.  Denies any breast concerns.  She had a bout of C. difficile  infection recently and completed course of Vancomycin.  She also had shingles and COVID in the earlier part of the year.  ECOG PS- 1 Pain scale- 0   Review of systems- Review of Systems  Constitutional:  Positive for malaise/fatigue. Negative for chills, fever and weight loss.  HENT:  Negative for congestion, ear discharge and nosebleeds.   Eyes:  Negative for blurred vision.  Respiratory:  Negative for cough, hemoptysis, sputum production, shortness of breath and wheezing.   Cardiovascular:  Negative for chest pain, palpitations, orthopnea and claudication.  Gastrointestinal:  Negative for abdominal pain, blood in stool, constipation, diarrhea, heartburn, melena, nausea and vomiting.  Genitourinary:  Negative for dysuria, flank pain, frequency, hematuria and urgency.  Musculoskeletal:  Negative for back pain, joint pain and myalgias.  Skin:  Negative for rash.  Neurological:  Negative for dizziness, tingling, focal weakness, seizures, weakness and headaches.  Endo/Heme/Allergies:  Does not bruise/bleed easily.  Psychiatric/Behavioral:  Negative for depression and suicidal ideas. The patient does not have insomnia.       Allergies  Allergen Reactions   Flagyl [Metronidazole Hcl] Swelling    Swelling in face, mouth, throat, palms of feet and hands.   Furosemide Itching    Made side of mouth irritated and puffy.  The rim inside her lip swelled up   Macrodantin [Nitrofurantoin Macrocrystal] Anaphylaxis, Hives and Swelling   Penicillins Anaphylaxis and Swelling    PATIENT HAD A PCN REACTION WITH IMMEDIATE RASH, FACIAL/TONGUE/THROAT SWELLING, SOB, OR LIGHTHEADEDNESS WITH HYPOTENSION:  #  #  #  YES  #  #  #   Has patient had a PCN reaction causing severe rash involving mucus membranes or skin necrosis: No  Has patient had a PCN reaction that required hospitalization: No Has patient had a PCN reaction occurring within the last 10 years: No If all of the above answers are "NO", then may  proceed with Cephalosporin use.    Celebrex [Celecoxib] Swelling    Swelling in joints.  Did not agree with her. Did not help her either   Other     Pt says it was recently discovered she allergic to an ingredient in local anesthesia but she can't recall the name. (epinephrine and the additive used for the biopsy. Gets very nervous. Feels internally shaking. 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 (2018 and 2021)     Past Medical History:  Diagnosis Date   Allergic rhinitis    Anemia    low iron   Anxiety    Arthritis 07/2019   osteoporosis   Asthma without status asthmaticus    seasonal, not often   Bilateral sciatica    Breast cancer ([place]) 07/2019   invasive mammary cancer , grade 2   Bronchitis    Cancer (HCC)     follicular variant papillary thyroid cancer   Chronic back pain    unspecified   Chronic kidney disease    ckd stage 3b   Colon polyp    adenomatous   DDD (degenerative disc disease), lumbar    DJD (degenerative joint disease)    Dysphagia    Dyspnea    E coli bacteremia    around 10/11   Environmental allergies    Family history of breast cancer    Family history of colon cancer    Family history of ovarian cancer    Family history of pancreatic cancer    Family history of prostate cancer    Family history of thyroid cancer    GERD (gastroesophageal reflux disease)    Headache    migraines as a young woman   History of recurrent UTIs    Hyperlipidemia    Hypertension    Hypothyroidism    IBS (irritable bowel syndrome)    Lumbar disc disease    Mold exposure 2002   hx of    Osteoporosis, post-menopausal    Personal history of radiation therapy    Pneumonia 1978   Restless legs    Scoliosis    Spinal stenosis    Thyroid nodule    aspirated in 1997 and 2003 which were negative   Ulcer    Varicose veins of left lower extremity    Vertigo    Wears dentures      Past Surgical History:  Procedure Laterality Date    ABDOMINAL HYSTERECTOMY  1977   Dr. Tito Dine FRACTURE SURGERY Left 2003   arch of foot.  no metal. per patient, she did not have surgery for this fracture. healed on its own   ANTERIOR LAT LUMBAR FUSION Left 09/09/2016   Procedure: Left Lumbar two-three Anterolateral lumbar interbody fusion with lateral plate;  Surgeon: Erline Levine, MD;  Location: [facility];  Service: Neurosurgery;  Laterality: Left;  Left L2-3 Anterolateral lumbar interbody fusion with lateral plate   APPENDECTOMY     BACK SURGERY  2013, 2016, 2018   lumbar lam, fusion, discectomy.titanium   BIOPSY THYROID     x2  thyroid nodule ; Dr. Sharlet Salina and one by Dr. Sabra Heck   BREAST BIOPSY Left 08/02/2019   coil clip-INVASIVE MAMMARY CARCINOMA   BREAST CYST ASPIRATION  1970's   BREAST LUMPECTOMY Left  2021   BREAST LUMPECTOMY WITH RADIOFREQUENCY TAG IDENTIFICATION Left 10/09/2033   Procedure: BREAST LUMPECTOMY WITH RADIOFREQUENCY TAG IDENTIFICATION;  Surgeon: Ronny Bacon, MD;  Location: [facility];  Service: General;  Laterality: Left;   BREAST LUMPECTOMY WITH SENTINEL LYMPH NODE BIOPSY Left 08/12/2019   Procedure: BREAST LUMPECTOMY WITH SENTINEL LYMPH NODE BX;  Surgeon: Ronny Bacon, MD;  Location: [facility];  Service: General;  Laterality: Left;   broken arm Left 1994   wrist fracture surgery, left.  metal has been replaced.   CARDIOVASCULAR STRESS TEST     2010   COLONOSCOPY  2010   COLONOSCOPY  07/21/2013   PHX OF CP/REPEAT 30YRS/[place]   ESOPHAGOGASTRODUODENOSCOPY (EGD) WITH PROPOFOL N/A 09/22/2018   Procedure: ESOPHAGOGASTRODUODENOSCOPY (EGD) WITH PROPOFOL;  Surgeon: Toledo, Benay Pike, MD;  Location: [facility];  Service: Gastroenterology;  Laterality: N/A;  Patient to have Rapid COVID-19 day of procedure Kieth Brightly approved this)   EYE SURGERY Bilateral    cataract surgery with lens implant   FOREIGN BODY REMOVAL N/A 12/09/2019   Procedure: FOREIGN BODY REMOVAL ADULT;  Surgeon: Hessie Knows, MD;  Location: [facility];   Service: Orthopedics;  Laterality: N/A;   INCISION AND DRAINAGE Left 12/09/2019   Procedure: INCISION AND DRAINAGE;  Surgeon: Hessie Knows, MD;  Location: [facility];  Service: Orthopedics;  Laterality: Left;   LACERATION REPAIR Bilateral 12/09/2019   Procedure: REPAIR MULTIPLE LACERATIONS;  Surgeon: Hessie Knows, MD;  Location: [facility];  Service: Orthopedics;  Laterality: Bilateral;   LUMBAR LAMINECTOMY/DECOMPRESSION MICRODISCECTOMY  11/18/2011   Procedure: LUMBAR LAMINECTOMY/DECOMPRESSION MICRODISCECTOMY 2 LEVELS;  Surgeon: Erline Levine, MD;  Location: [facility];  Service: Neurosurgery;  Laterality: N/A;  Lumbar Three-Four,  Lumbar Four-Five Decompressive Laminectomy   MALONEY DILATION N/A 09/22/2018   Procedure: MALONEY DILATION;  Surgeon: Toledo, Benay Pike, MD;  Location: [facility];  Service: Gastroenterology;  Laterality: N/A;   OVARIAN CYST SURGERY     POSTERIOR FUSION LUMBAR SPINE  2016   TOTAL THYROIDECTOMY     TRANSTHORACIC ECHOCARDIOGRAM  2010   upper and lower GI  2010    Social History   Socioeconomic History   Marital status: Widowed    Spouse name: Not on file   Number of children: Not on file   Years of education: Not on file   Highest education level: Not on file  Occupational History   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   Social Determinants of Health   Financial Resource Strain: Not on file  Food Insecurity: Not on file  Transportation Needs: Not on file  Physical Activity: Not on file  Stress: Not on file  Social Connections: Not on file   Intimate Partner Violence: Not on file    Family History  Problem Relation Age of Onset   Breast cancer Mother 73   Thyroid cancer Mother    Pancreatic cancer Brother    Pancreatic cancer Sister    Suicidality Father    Prostate cancer Brother    Colon cancer Brother  Breast cancer Sister        x 2 times   Heart attack Sister    Ovarian cancer Maternal Aunt    Breast cancer Paternal Aunt    Lung cancer Paternal Uncle    Breast cancer Other    Colon cancer Maternal Aunt    Pancreatic cancer Maternal Aunt      Current Outpatient Medications:    amLODipine (NORVASC) 5 MG tablet, Take 5 mg by mouth daily. Takes in the evening, Disp: , Rfl:    anastrozole (ARIMIDEX) 1 MG tablet, Take 1 tablet (1 mg total) by mouth daily., Disp: 90 tablet, Rfl: 2   atorvastatin (LIPITOR) 40 MG tablet, Take 40 mg by mouth at bedtime. , Disp: , Rfl:    Calcium Carbonate-Vitamin D 600-400 MG-UNIT tablet, Take 2 tablets by mouth daily., Disp: , Rfl:    chlorthalidone (HYGROTON) 25 MG tablet, Take 25 mg by mouth in the morning and at bedtime. , Disp: , Rfl: 1   denosumab (PROLIA) 60 MG/ML SOSY injection, Inject 60 mg into the skin every 6 (six) months., Disp: , Rfl:    fexofenadine (ALLEGRA) 180 MG tablet, Take 180 mg by mouth daily., Disp: , Rfl:    fluticasone (FLONASE) 50 MCG/ACT nasal spray, Place 2 sprays into both nostrils daily as needed for allergies., Disp: , Rfl:    gabapentin (NEURONTIN) 300 MG capsule, Take 300 mg by mouth at bedtime., Disp: , Rfl:    GLUCOSAMINE-CHONDROITIN PO, Take 1 tablet by mouth daily. , Disp: , Rfl:    irbesartan (AVAPRO) 300 MG tablet, Take 300 mg by mouth daily., Disp: , Rfl:    levothyroxine (SYNTHROID) 150 MCG tablet, Take 150 mcg by mouth daily before breakfast., Disp: , Rfl:    Multiple Vitamin (MULTIVITAMIN WITH MINERALS) TABS, Take 1 tablet by mouth daily., Disp: , Rfl:    omeprazole (PRILOSEC) 40 MG capsule, Take 40 mg by mouth daily before breakfast.,  Disp: , Rfl:    vitamin E 180 MG (400 UNITS) capsule, Take 400 Units by mouth daily., Disp: , Rfl:    cholecalciferol (VITAMIN D3) 25 MCG (1000 UNIT) tablet, Take 1,000 Units by mouth daily.  (Patient not taking: Reported on 01/21/2021), Disp: , Rfl:    gabapentin (NEURONTIN) 100 MG capsule, Take 100 mg by mouth 2 (two) times daily. (Patient not taking: Reported on 01/21/2021), Disp: , Rfl:   Physical exam:  Vitals:   01/21/21 1051  BP: 128/83  Pulse: 82  Resp: 16  Temp: 98.8 F (37.1 C)  TempSrc: Oral  Weight: 167 lb 14.4 oz (76.2 kg)  Height: 5' 3"  (1.6 m)   Physical Exam Cardiovascular:     Rate and Rhythm: Normal rate and regular rhythm.     Heart sounds: Normal heart sounds.  Pulmonary:     Effort: Pulmonary effort is normal.     Breath sounds: Normal breath sounds.  Abdominal:     General: Bowel sounds are normal.     Palpations: Abdomen is soft.  Skin:    General: Skin is warm and dry.  Neurological:     Mental Status: She is alert and oriented to person, place, and time.   Breast exam was performed in seated and lying down position. Patient is status post left lumpectomy with a well-healed surgical scar. No evidence of any palpable masses. No evidence of axillary adenopathy. No evidence of any palpable masses or lumps in the right breast. No evidence of right axillary adenopathy  CMP Latest Ref Rng & Units 01/21/2021  Glucose 70 - 99 mg/dL 103(H)  BUN 8 - 23 mg/dL 25(H)  Creatinine 0.44 - 1.00 mg/dL 1.42(H)  Sodium 135 - 145 mmol/L 137  Potassium 3.5 - 5.1 mmol/L 3.9  Chloride 98 - 111 mmol/L 104  CO2 22 - 32 mmol/L 25  Calcium 8.9 - 10.3 mg/dL 8.6(L)  Total Protein 6.5 - 8.1 g/dL 6.9  Total Bilirubin 0.3 - 1.2 mg/dL 0.9  Alkaline Phos 38 - 126 U/L 74  AST 15 - 41 U/L 18  ALT 0 - 44 U/L 15   CBC Latest Ref Rng & Units 01/21/2021  WBC 4.0 - 10.5 K/uL 4.6  Hemoglobin 12.0 - 15.0 g/dL 11.9(L)  Hematocrit 36.0 - 46.0 % 37.7  Platelets 150 - 400 K/uL 150      Assessment and plan- Patient is a 80 y.o. female with stage I left breast cancer s/p lumpectomy adjuvant radiation therapy and currently on Arimidex.  She is here for routine follow-up  Clinically patient is doing well with no concerning signs and symptoms of recurrence based on today's exam.  She is tolerating Arimidex well.  She does have baseline osteoporosis and follows up with endocrinology.  She has been receiving Prolia every 6 months.  She is already scheduled for a mammogram this month.  I will see her back in 6 months   Visit Diagnosis 1. Encounter for follow-up surveillance of breast cancer   2. Visit for monitoring Arimidex therapy      Dr. Randa Evens, MD, MPH [facility] [phone] 01/21/2021 11:43 AM

## 2021-01-21 NOTE — Progress Notes (Signed)
Pt had ecoli in October early in the month. Was on vancomycin. Has no breast problems. Has been set up for the mammogram already

## 2021-01-29 ENCOUNTER — Ambulatory Visit
Admission: RE | Admit: 2021-01-29 | Discharge: 2021-01-29 | Disposition: A | Discharge status: Home / Self Care | Payer: Medicare Other | Source: Ambulatory Visit | Attending: Surgery | Admitting: Surgery

## 2021-01-29 ENCOUNTER — Other Ambulatory Visit: Payer: Self-pay

## 2021-01-29 DIAGNOSIS — C50312 Malignant neoplasm of 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: Secondary | ICD-10-CM

## 2021-02-05 ENCOUNTER — Other Ambulatory Visit: Payer: Self-pay

## 2021-02-05 ENCOUNTER — Ambulatory Visit: Payer: Medicare Other | Admitting: Surgery

## 2021-02-05 ENCOUNTER — Encounter: Payer: Self-pay | Admitting: Surgery

## 2021-02-05 VITALS — BP 130/82 | HR 96 | Temp 97.8°F | Ht 63.0 in | Wt 169.6 lb

## 2021-02-05 DIAGNOSIS — C50312 Malignant neoplasm of 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: Secondary | ICD-10-CM | POA: Diagnosis not present

## 2021-02-05 NOTE — Patient Instructions (Addendum)
The patient has been asked to return to the office in one year with a bilateral diagnostic mammogram.   Continue self breast exams. Call office for any new breast issues or concerns.   Breast Self-Awareness Breast self-awareness is knowing how your breasts look and feel. Doing breast self-awareness is important. It allows you to catch a breast problem early while it is still small and can be treated. All women should do breast self-awareness, including women who have had breast implants. Tell your doctor if you notice a change in your breasts. What you need: A mirror. A well-lit room. How to do a breast self-exam A breast self-exam is one way to learn what is normal for your breasts and to check for changes. To do a breast self-exam: Look for changes  Take off all the clothes above your waist. Stand in front of a mirror in a room with good lighting. Put your hands on your hips. Push your hands down. Look at your breasts and nipples in the mirror to see if one breast or nipple looks different from the other. Check to see if: The shape of one breast is different. The size of one breast is different. There are wrinkles, dips, and bumps in one breast and not the other. Look at each breast for changes in the skin, such as: Redness. Scaly areas. Look for changes in your nipples, such as: Liquid around the nipples. Bleeding. Dimpling. Redness. A change in where the nipples are. Feel for changes  Lie on your back on the floor. Feel each breast. To do this, follow these steps: Pick a breast to feel. Put the arm closest to that breast above your head. Use your other arm to feel the nipple area of your breast. Feel the area with the pads of your three middle fingers by making small circles with your fingers. For the first circle, press lightly. For the second circle, press harder. For the third circle, press even harder. Keep making circles with your fingers at the different pressures as  you move down your breast. Stop when you feel your ribs. Move your fingers a little toward the center of your body. Start making circles with your fingers again, this time going up until you reach your collarbone. Keep making up-and-down circles until you reach your armpit. Remember to keep using the three pressures. Feel the other breast in the same way. Sit or stand in the tub or shower. With soapy water on your skin, feel each breast the same way you did in step 2 when you were lying on the floor. Write down what you find Writing down what you find can help you remember what to tell your doctor. Write down: What is normal for each breast. Any changes you find in each breast, including: The kind of changes you find. Whether you have pain. Size and location of any lumps. When you last had your menstrual period. General tips Check your breasts every month. If you are breastfeeding, the best time to check your breasts is after you feed your baby or after you use a breast pump. If you get menstrual periods, the best time to check your breasts is 5-7 days after your menstrual period is over. With time, you will become comfortable with the self-exam, and you will begin to know if there are changes in your breasts. Contact a doctor if you: See a change in the shape or size of your breasts or nipples. See a change in the skin  of your breast or nipples, such as red or scaly skin. Have fluid coming from your nipples that is not normal. Find a lump or thick area that was not there before. Have pain in your breasts. Have any concerns about your breast health. Summary Breast self-awareness includes looking for changes in your breasts, as well as feeling for changes within your breasts. Breast self-awareness should be done in front of a mirror in a well-lit room. You should check your breasts every month. If you get menstrual periods, the best time to check your breasts is 5-7 days after your  menstrual period is over. Let your doctor know of any changes you see in your breasts, including changes in size, changes on the skin, pain or tenderness, or fluid from your nipples that is not normal. This information is not intended to replace advice given to you by your health care provider. Make sure you discuss any questions you have with your health care provider. Document Revised: 10/20/2017 Document Reviewed: 10/20/2017 Elsevier Patient Education  [employer].

## 2021-02-05 NOTE — Progress Notes (Signed)
Surgical Clinic Progress/Follow-up Note   HPI:  80 y.o. Female presents to clinic for left breast cancer follow-up.  She reports no issues with her left breast.   Most recent hospitalization was from dealing with a bout from C. difficile. She appears to be tolerating her Arimidex well.  And has had new medication to help fortify her bones.   Review of Systems:  Constitutional: denies fever/chills  ENT: denies sore throat, hearing problems  Respiratory: denies shortness of breath, wheezing  Cardiovascular: denies chest pain, palpitations  Gastrointestinal: denies abdominal pain, N/V, or diarrhea Skin: Denies any other rashes or skin discolorations  Vital Signs:  There were no vitals taken for this visit.   Physical Exam:  Constitutional:  -- Normal body habitus  -- Awake, alert, and oriented x3  Pulmonary:  -- No crackles -- Equal breath sounds bilaterally -- Breathing non-labored at rest Cardiovascular:  -- S1, S2 present  -- No pericardial rubs  Gastrointestinal:  -- Soft and non-distended, non-tender/with no tenderness to palpation, no guarding/rebound tenderness GU  --bilateral breast exam unremarkable for any skin changes, suspicious or dominant nodularity.  Her left central-areolar scar is well-healed Musculoskeletal / Integumentary:  -- Wounds or skin discoloration: None appreciated    Laboratory studies: Radiology review:   Imaging:  CLINICAL DATA:  80 year old female status post malignant left lumpectomy in 2021.   EXAM: DIGITAL DIAGNOSTIC BILATERAL MAMMOGRAM WITH TOMOSYNTHESIS AND CAD   TECHNIQUE: Bilateral digital diagnostic mammography and breast tomosynthesis was performed. The images were evaluated with computer-aided detection.   COMPARISON:  Previous exam(s).   ACR Breast Density Category c: The breast tissue is heterogeneously dense, which may obscure small masses.   FINDINGS: Stable postsurgical changes in the inner left breast. No new  or suspicious findings in either breast. The parenchymal pattern is otherwise stable.   IMPRESSION: 1. No mammographic evidence of malignancy. 2. Stable left breast posttreatment changes.   RECOMMENDATION: Diagnostic mammogram is suggested in 1 year. (Code:DM-B-01Y)   I have discussed the findings and recommendations with the patient. If applicable, a reminder letter will be sent to the patient regarding the next appointment.   BI-RADS CATEGORY  2: Benign.     Electronically Signed   By: Kristopher Oppenheim M.D.   On: 01/29/2021 10:22   Assessment:  80 y.o. yo Female with a problem list including...  Patient Active Problem List   Diagnosis Date Noted   Laceration of left leg 12/09/2019   Genetic testing 08/29/2019   Family history of breast cancer    Family history of ovarian cancer    Family history of pancreatic cancer    Family history of prostate cancer    Family history of colon cancer    Family history of thyroid cancer    Goals of care, counseling/discussion 08/12/2019   Malignant neoplasm of 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 ([place]) 08/04/2019   Hyperlipidemia 02/03/2018   Esophageal stenosis 01/28/2018   Lymphedema 10/14/2017   Hypertension 08/19/2017   Swelling of limb 08/19/2017   Pain in limb 08/19/2017   DOE (dyspnea on exertion) 07/29/2017   Non-seasonal allergic rhinitis 07/29/2017   Varicose veins of left leg with edema 07/29/2017   Dysphagia, oropharyngeal 04/30/2017   Lumbar degenerative disc disease 04/30/2017   Osteopenia of multiple sites 04/30/2017   Spinal stenosis of lumbar region 12/08/2016   Lumbar compression fracture ([place]) 12/02/2016   Idiopathic scoliosis of lumbar region 09/09/2016   Chronic midline low back pain with right-sided sciatica 07/28/2016  Gastroesophageal reflux disease without esophagitis 07/27/2015   Intermittent vertigo 07/27/2015   Osteoporosis, senile 07/27/2015   Seasonal allergic rhinitis due to pollen  07/27/2015   Acquired hypothyroidism 01/05/2015   Lumbar scoliosis 10/27/2014   Benign essential hypertension 06/29/2014   Mixed hyperlipidemia 06/29/2014   Insomnia 11/11/2013   Mild reactive airways disease 11/11/2013   Bilateral sciatica 09/29/2013    presents to clinic for follow-up evaluation of left breast cancer, progressing well.  Plan:              - return to clinic in 1 year with follow-up imaging, or as needed, instructed to call office if any questions or concerns  -Continue her Arimidex      Ronny Bacon, MD, [place]: [place] [facility]. Office: [phone]

## 2021-02-14 ENCOUNTER — Other Ambulatory Visit: Payer: Self-pay | Admitting: *Deleted

## 2021-02-14 MED ORDER — ANASTROZOLE 1 MG PO TABS: 1.0000 mg | ORAL_TABLET | Freq: Every day | ORAL | 1 refills | Status: DC

## 2021-02-26 LAB — HM COLONOSCOPY

## 2021-03-21 DIAGNOSIS — R110 Nausea: Secondary | ICD-10-CM | POA: Diagnosis not present

## 2021-03-21 DIAGNOSIS — I10 Essential (primary) hypertension: Secondary | ICD-10-CM | POA: Diagnosis not present

## 2021-03-21 DIAGNOSIS — R42 Dizziness and giddiness: Secondary | ICD-10-CM | POA: Diagnosis not present

## 2021-04-01 DIAGNOSIS — K219 Gastro-esophageal reflux disease without esophagitis: Secondary | ICD-10-CM | POA: Diagnosis not present

## 2021-04-01 DIAGNOSIS — J45909 Unspecified asthma, uncomplicated: Secondary | ICD-10-CM | POA: Diagnosis not present

## 2021-04-01 DIAGNOSIS — I129 Hypertensive chronic kidney disease with stage 1 through stage 4 chronic kidney disease, or unspecified chronic kidney disease: Secondary | ICD-10-CM | POA: Diagnosis not present

## 2021-04-01 DIAGNOSIS — E890 Postprocedural hypothyroidism: Secondary | ICD-10-CM | POA: Diagnosis not present

## 2021-04-01 DIAGNOSIS — N1832 Chronic kidney disease, stage 3b: Secondary | ICD-10-CM | POA: Diagnosis not present

## 2021-04-01 DIAGNOSIS — Z0000 Encounter for general adult medical examination without abnormal findings: Secondary | ICD-10-CM | POA: Diagnosis not present

## 2021-05-09 DIAGNOSIS — M5136 Other intervertebral disc degeneration, lumbar region: Secondary | ICD-10-CM | POA: Diagnosis not present

## 2021-05-09 DIAGNOSIS — M159 Polyosteoarthritis, unspecified: Secondary | ICD-10-CM | POA: Diagnosis not present

## 2021-06-04 ENCOUNTER — Ambulatory Visit (INDEPENDENT_AMBULATORY_CARE_PROVIDER_SITE_OTHER): Payer: Medicare HMO | Admitting: Internal Medicine

## 2021-06-04 ENCOUNTER — Encounter: Payer: Self-pay | Admitting: Internal Medicine

## 2021-06-04 ENCOUNTER — Other Ambulatory Visit: Payer: Self-pay

## 2021-06-04 VITALS — BP 122/70 | HR 87 | Ht 63.0 in | Wt 170.4 lb

## 2021-06-04 DIAGNOSIS — M810 Age-related osteoporosis without current pathological fracture: Secondary | ICD-10-CM

## 2021-06-04 DIAGNOSIS — C50312 Malignant neoplasm of 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: Secondary | ICD-10-CM

## 2021-06-04 DIAGNOSIS — F5101 Primary insomnia: Secondary | ICD-10-CM | POA: Diagnosis not present

## 2021-06-04 DIAGNOSIS — N1832 Chronic kidney disease, stage 3b: Secondary | ICD-10-CM | POA: Diagnosis not present

## 2021-06-04 DIAGNOSIS — M48061 Spinal stenosis, lumbar region without neurogenic claudication: Secondary | ICD-10-CM | POA: Diagnosis not present

## 2021-06-04 DIAGNOSIS — R21 Rash and other nonspecific skin eruption: Secondary | ICD-10-CM

## 2021-06-04 DIAGNOSIS — I10 Essential (primary) hypertension: Secondary | ICD-10-CM | POA: Diagnosis not present

## 2021-06-04 DIAGNOSIS — E039 Hypothyroidism, unspecified: Secondary | ICD-10-CM | POA: Diagnosis not present

## 2021-06-04 DIAGNOSIS — K219 Gastro-esophageal reflux disease without esophagitis: Secondary | ICD-10-CM

## 2021-06-04 NOTE — Patient Instructions (Signed)
Use cortisone cream twice a day to rash on arms and leg. ?

## 2021-06-04 NOTE — Progress Notes (Signed)
? ? ?Date:  06/04/2021  ? ?Name:  Latoya Stevenson   DOB:  [birth year]   MRN:  [id number] ? ? ?Chief Complaint: New Patient (Initial Visit) ?PCP prescribes amlodipine, losartan, omeprazole, levothyroxine, atorvastatin. ? ?Insomnia ?Primary symptoms: fragmented sleep, sleep disturbance, difficulty falling asleep.   ?The current episode started more than one month. The onset quality is gradual. The problem occurs nightly. The symptoms are aggravated by pain.  ?Hypertension ?This is a chronic problem. The problem is controlled. Pertinent negatives include no chest pain, headaches, palpitations or shortness of breath. Past treatments include calcium channel blockers and diuretics. Identifiable causes of hypertension include a thyroid problem.  ?Hyperlipidemia ?This is a chronic problem. The problem is controlled. Associated symptoms include myalgias. Pertinent negatives include no chest pain or shortness of breath. Current antihyperlipidemic treatment includes statins.  ?Thyroid Problem ?Presents for follow-up visit. Patient reports no anxiety, constipation, diarrhea, fatigue or palpitations. The symptoms have been stable. Her past medical history is significant for hyperlipidemia.  ?Rash ?This is a new problem. The current episode started 1 to 4 weeks ago. The problem is unchanged. The affected locations include the left arm and right lower leg. The rash is characterized by itchiness and redness. She was exposed to nothing. Pertinent negatives include no cough, diarrhea, fatigue or shortness of breath. Past treatments include nothing.  ? ?Lab Results  ?Component Value Date  ? NA 137 01/21/2021  ? K 3.9 01/21/2021  ? CO2 25 01/21/2021  ? GLUCOSE 103 (H) 01/21/2021  ? BUN 25 (H) 01/21/2021  ? CREATININE 1.42 (H) 01/21/2021  ? CALCIUM 8.6 (L) 01/21/2021  ? GFRNONAA 38 (L) 01/21/2021  ? ?No results found for: CHOL, HDL, LDLCALC, LDLDIRECT, TRIG, CHOLHDL ?No results found for: TSH ?No results found for: HGBA1C ?Lab Results   ?Component Value Date  ? WBC 4.6 01/21/2021  ? HGB 11.9 (L) 01/21/2021  ? HCT 37.7 01/21/2021  ? MCV 88.5 01/21/2021  ? PLT 150 01/21/2021  ? ?Lab Results  ?Component Value Date  ? ALT 15 01/21/2021  ? AST 18 01/21/2021  ? ALKPHOS 74 01/21/2021  ? BILITOT 0.9 01/21/2021  ? ?Lab Results  ?Component Value Date  ? VD25OH 32.16 01/21/2021  ?  ? ?Review of Systems  ?Constitutional:  Negative for chills, fatigue and unexpected weight change.  ?HENT:  Negative for nosebleeds.   ?Eyes:  Negative for visual disturbance.  ?Respiratory:  Negative for cough, chest tightness, shortness of breath and wheezing.   ?Cardiovascular:  Negative for chest pain, palpitations and leg swelling.  ?Gastrointestinal:  Negative for abdominal pain, constipation and diarrhea.  ?Genitourinary:  Negative for dysuria.  ?Musculoskeletal:  Positive for arthralgias, back pain, joint swelling and myalgias.  ?Skin:  Positive for rash.  ?Neurological:  Negative for dizziness, weakness, light-headedness and headaches.  ?Psychiatric/Behavioral:  Positive for sleep disturbance. Negative for dysphoric mood. The patient has insomnia. The patient is not nervous/anxious.   ? ?Patient Active Problem List  ? Diagnosis Date Noted  ? Malignant neoplasm of 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 ([place]) 08/04/2019  ? Esophageal stenosis 01/28/2018  ? Lymphedema 10/14/2017  ? Varicose veins of left leg with edema 07/29/2017  ? Dysphagia, oropharyngeal 04/30/2017  ? Lumbar degenerative disc disease 04/30/2017  ? Spinal stenosis of lumbar region 12/08/2016  ? Idiopathic scoliosis of lumbar region 09/09/2016  ? Chronic midline low back pain with right-sided sciatica 07/28/2016  ? Gastroesophageal reflux disease without esophagitis 07/27/2015  ? Intermittent vertigo 07/27/2015  ? Osteoporosis, senile  07/27/2015  ? Seasonal allergic rhinitis due to pollen 07/27/2015  ? Acquired hypothyroidism 01/05/2015  ? Benign essential hypertension 06/29/2014  ? Mixed hyperlipidemia  06/29/2014  ? Stage 3b chronic kidney disease ([place]) 06/29/2014  ? Insomnia 11/11/2013  ? Mild reactive airways disease 11/11/2013  ? ? ?Allergies  ?Allergen Reactions  ? Flagyl [Metronidazole Hcl] Swelling  ?  Swelling in face, mouth, throat, palms of feet and hands.  ? Furosemide Itching  ?  Made side of mouth irritated and puffy.  The rim inside her lip swelled up  ? Macrodantin [Nitrofurantoin Macrocrystal] Anaphylaxis, Hives and Swelling  ? Penicillins Anaphylaxis and Swelling  ?  PATIENT HAD A PCN REACTION WITH IMMEDIATE RASH, FACIAL/TONGUE/THROAT SWELLING, SOB, OR LIGHTHEADEDNESS WITH HYPOTENSION:  #  #  #  YES  #  #  #   ?Has patient had a PCN reaction causing severe rash involving mucus membranes or skin necrosis: No ?Has patient had a PCN reaction that required hospitalization: No ?Has patient had a PCN reaction occurring within the last 10 years: No ?If all of the above answers are "NO", then may proceed with Cephalosporin use. ?  ? Celebrex [Celecoxib] Swelling  ?  Swelling in joints.  Did not agree with her. Did not help her either  ? Other   ?  Pt says it was recently discovered she allergic to an ingredient in local anesthesia but she can't recall the name. (epinephrine and the additive used for the biopsy. Gets very nervous. Feels internally shaking. 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 (2018 and 2021)  ? ? ?Past Surgical History:  ?Procedure Laterality Date  ? ABDOMINAL HYSTERECTOMY  1977  ? Dr. Laverta Baltimore  ? ANKLE FRACTURE SURGERY Left 2003  ? arch of foot.  no metal. per patient, she did not have surgery for this fracture. healed on its own  ? ANTERIOR LAT LUMBAR FUSION Left 09/09/2016  ? Procedure: Left Lumbar two-three Anterolateral lumbar interbody fusion with lateral plate;  Surgeon: Erline Levine, MD;  Location: [facility];  Service: Neurosurgery;  Laterality: Left;  Left L2-3 Anterolateral lumbar interbody fusion with lateral plate  ? APPENDECTOMY    ? BACK SURGERY  2013, 2016, 2018  ?  lumbar lam, fusion, discectomy.titanium  ? BIOPSY THYROID    ? x2  thyroid nodule ; Dr. Sharlet Salina and one by Dr. Sabra Heck  ? BREAST BIOPSY Left 08/02/2019  ? coil clip-INVASIVE MAMMARY CARCINOMA  ? BREAST CYST ASPIRATION  1970's  ? BREAST LUMPECTOMY Left 2021  ? BREAST LUMPECTOMY WITH RADIOFREQUENCY TAG IDENTIFICATION Left 7/42/5956  ? Procedure: BREAST LUMPECTOMY WITH RADIOFREQUENCY TAG IDENTIFICATION;  Surgeon: Ronny Bacon, MD;  Location: [facility];  Service: General;  Laterality: Left;  ? BREAST LUMPECTOMY WITH SENTINEL LYMPH NODE BIOPSY Left 08/12/2019  ? Procedure: BREAST LUMPECTOMY WITH SENTINEL LYMPH NODE BX;  Surgeon: Ronny Bacon, MD;  Location: [facility];  Service: General;  Laterality: Left;  ? broken arm Left 1994  ? wrist fracture surgery, left.  metal has been replaced.  ? CARDIOVASCULAR STRESS TEST    ? 2010  ? COLONOSCOPY  2010  ? COLONOSCOPY  07/21/2013  ? PHX OF CP/REPEAT 76YRS/[place]  ? ESOPHAGOGASTRODUODENOSCOPY (EGD) WITH PROPOFOL N/A 09/22/2018  ? Procedure: ESOPHAGOGASTRODUODENOSCOPY (EGD) WITH PROPOFOL;  Surgeon: Toledo, Benay Pike, MD;  Location: [facility];  Service: Gastroenterology;  Laterality: N/A;  Patient to have Rapid COVID-19 day of procedure Kieth Brightly approved this)  ? EYE SURGERY Bilateral   ? cataract  surgery with lens implant  ? FOREIGN BODY REMOVAL N/A 12/09/2019  ? Procedure: FOREIGN BODY REMOVAL ADULT;  Surgeon: Hessie Knows, MD;  Location: [facility];  Service: Orthopedics;  Laterality: N/A;  ? INCISION AND DRAINAGE Left 12/09/2019  ? Procedure: INCISION AND DRAINAGE;  Surgeon: Hessie Knows, MD;  Location: [facility];  Service: Orthopedics;  Laterality: Left;  ? LACERATION REPAIR Bilateral 12/09/2019  ? Procedure: REPAIR MULTIPLE LACERATIONS;  Surgeon: Hessie Knows, MD;  Location: [facility];  Service: Orthopedics;  Laterality: Bilateral;  ? LUMBAR LAMINECTOMY/DECOMPRESSION MICRODISCECTOMY  11/18/2011  ? Procedure: LUMBAR LAMINECTOMY/DECOMPRESSION MICRODISCECTOMY 2 LEVELS;  Surgeon:  Erline Levine, MD;  Location: [facility];  Service: Neurosurgery;  Laterality: N/A;  Lumbar Three-Four,  Lumbar Four-Five Decompressive Laminectomy  ? MALONEY DILATION N/A 09/22/2018  ? Procedure: MALONEY DI

## 2021-06-05 LAB — TSH+FREE T4
Free T4: 1.76 ng/dL (ref 0.82–1.77)
TSH: 0.073 u[IU]/mL — ABNORMAL LOW (ref 0.450–4.500)

## 2021-06-12 DIAGNOSIS — Z6829 Body mass index (BMI) 29.0-29.9, adult: Secondary | ICD-10-CM | POA: Diagnosis not present

## 2021-06-12 DIAGNOSIS — M5416 Radiculopathy, lumbar region: Secondary | ICD-10-CM | POA: Diagnosis not present

## 2021-06-18 DIAGNOSIS — M5416 Radiculopathy, lumbar region: Secondary | ICD-10-CM | POA: Diagnosis not present

## 2021-06-28 DIAGNOSIS — M5459 Other low back pain: Secondary | ICD-10-CM | POA: Diagnosis not present

## 2021-06-28 DIAGNOSIS — M6281 Muscle weakness (generalized): Secondary | ICD-10-CM | POA: Diagnosis not present

## 2021-07-01 DIAGNOSIS — M5459 Other low back pain: Secondary | ICD-10-CM | POA: Diagnosis not present

## 2021-07-01 DIAGNOSIS — M6281 Muscle weakness (generalized): Secondary | ICD-10-CM | POA: Diagnosis not present

## 2021-07-03 DIAGNOSIS — M5459 Other low back pain: Secondary | ICD-10-CM | POA: Diagnosis not present

## 2021-07-03 DIAGNOSIS — M6281 Muscle weakness (generalized): Secondary | ICD-10-CM | POA: Diagnosis not present

## 2021-07-08 DIAGNOSIS — Z6830 Body mass index (BMI) 30.0-30.9, adult: Secondary | ICD-10-CM | POA: Diagnosis not present

## 2021-07-08 DIAGNOSIS — M4120 Other idiopathic scoliosis, site unspecified: Secondary | ICD-10-CM | POA: Diagnosis not present

## 2021-07-09 DIAGNOSIS — M6281 Muscle weakness (generalized): Secondary | ICD-10-CM | POA: Diagnosis not present

## 2021-07-09 DIAGNOSIS — M5459 Other low back pain: Secondary | ICD-10-CM | POA: Diagnosis not present

## 2021-07-10 DIAGNOSIS — M6281 Muscle weakness (generalized): Secondary | ICD-10-CM | POA: Diagnosis not present

## 2021-07-10 DIAGNOSIS — M5459 Other low back pain: Secondary | ICD-10-CM | POA: Diagnosis not present

## 2021-07-11 DIAGNOSIS — M810 Age-related osteoporosis without current pathological fracture: Secondary | ICD-10-CM | POA: Diagnosis not present

## 2021-07-16 DIAGNOSIS — M6281 Muscle weakness (generalized): Secondary | ICD-10-CM | POA: Diagnosis not present

## 2021-07-16 DIAGNOSIS — M5459 Other low back pain: Secondary | ICD-10-CM | POA: Diagnosis not present

## 2021-07-18 DIAGNOSIS — M5459 Other low back pain: Secondary | ICD-10-CM | POA: Diagnosis not present

## 2021-07-18 DIAGNOSIS — M6281 Muscle weakness (generalized): Secondary | ICD-10-CM | POA: Diagnosis not present

## 2021-07-22 ENCOUNTER — Inpatient Hospital Stay: Payer: Medicare HMO | Attending: Oncology | Admitting: Oncology

## 2021-07-22 ENCOUNTER — Encounter: Payer: Self-pay | Admitting: Oncology

## 2021-07-22 VITALS — BP 143/80 | HR 82 | Temp 97.2°F | Resp 16 | Wt 168.7 lb

## 2021-07-22 DIAGNOSIS — Z08 Encounter for follow-up examination after completed treatment for malignant neoplasm: Secondary | ICD-10-CM | POA: Diagnosis not present

## 2021-07-22 DIAGNOSIS — Z5181 Encounter for therapeutic drug level monitoring: Secondary | ICD-10-CM | POA: Diagnosis not present

## 2021-07-22 DIAGNOSIS — Z79899 Other long term (current) drug therapy: Secondary | ICD-10-CM | POA: Diagnosis not present

## 2021-07-22 DIAGNOSIS — Z923 Personal history of irradiation: Secondary | ICD-10-CM | POA: Diagnosis not present

## 2021-07-22 DIAGNOSIS — C50912 Malignant neoplasm of unspecified site of left female breast: Secondary | ICD-10-CM | POA: Diagnosis not present

## 2021-07-22 DIAGNOSIS — Z79811 Long term (current) use of aromatase inhibitors: Secondary | ICD-10-CM | POA: Insufficient documentation

## 2021-07-22 DIAGNOSIS — M6281 Muscle weakness (generalized): Secondary | ICD-10-CM | POA: Diagnosis not present

## 2021-07-22 DIAGNOSIS — M5459 Other low back pain: Secondary | ICD-10-CM | POA: Diagnosis not present

## 2021-07-22 DIAGNOSIS — Z170 Estrogen receptor positive status [ER+]: Secondary | ICD-10-CM | POA: Diagnosis not present

## 2021-07-22 DIAGNOSIS — Z853 Personal history of malignant neoplasm of breast: Secondary | ICD-10-CM

## 2021-07-24 DIAGNOSIS — M5459 Other low back pain: Secondary | ICD-10-CM | POA: Diagnosis not present

## 2021-07-24 DIAGNOSIS — M6281 Muscle weakness (generalized): Secondary | ICD-10-CM | POA: Diagnosis not present

## 2021-07-25 DIAGNOSIS — I10 Essential (primary) hypertension: Secondary | ICD-10-CM | POA: Diagnosis not present

## 2021-07-25 DIAGNOSIS — E782 Mixed hyperlipidemia: Secondary | ICD-10-CM | POA: Diagnosis not present

## 2021-07-25 NOTE — Progress Notes (Signed)
? ? ? ?Hematology/Oncology Consult note ?[place]  ?[phone] [phone] ? ?Patient Care Team: ?Glean Hess, MD as PCP - General (Internal Medicine) ?Sindy Guadeloupe, MD as Consulting Physician (Oncology) ?Noreene Filbert, MD as Referring Physician (Radiation Oncology) ?Ronny Bacon, MD as Consulting Physician (General Surgery) ?Rico Junker, RN as Registered Nurse ?Quintin Alto, MD as Consulting Physician (Rheumatology) ?Judi Cong, MD as Physician Assistant (Endocrinology) ?Teodoro Spray, MD as Consulting Physician (Cardiology) ?[name], Benay Pike, MD as Consulting Physician (Gastroenterology)  ? ?Name of the patient: Latoya Stevenson  ?[id number]  ?04-01-1940  ? ?Date of visit: 07/25/21 ? 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 ?Chief complaint/ Reason for visit-routine follow-up of breast cancer on Arimidex ? ?Heme/Onc history: patient is a 81 year old female who underwent a screening bilateral mammogram in November 2020 which was normal.  She then noticed possible nipple discharge from her right breast which prompted a diagnostic right breast mammogram which was essentially unremarkable.  MRI was recommended and patient underwent a bilateral MRI which did not show any concerning findings in the right breast where she had the nipple discharge but showed an enhancing mass in the inner left breast measuring 0.9 x 0.6 x 0.7 cm which was suspicious and biopsy. Biopsy was consistent with invasive mammary carcinoma grade 2 6 mm ER greater than 90% positive PR greater than 90% positive and HER-2 negative.  ? Genetic testing negative ?  ?Final pathology showed 7 mm grade 2 invasive mammary carcinoma with positive unifocal anterior margin.  1 sentinel lymph node negative for malignancy.  PT1BPN0 tumor was greater than 90% ER positive PR positive  and HER-2 negative ?  ?Patient completed adjuvant radiation therapy and started Arimidex in October 2021.  ? ?Interval history-tolerating Arimidex well .  She is also taking her calcium and vitamin D.She reports having difficulty sleeping at night.  She does have some self-limited arthralgias which is overall stable as well as ongoing fatigue ? ?ECOG PS- 1 ?Pain scale- 0 ? ? ?Review of systems- Review of Systems  ?Constitutional:  Negative for chills, fever, malaise/fatigue and weight loss.  ?HENT:  Negative for congestion, ear discharge and nosebleeds.   ?Eyes:  Negative for blurred vision.  ?Respiratory:  Negative for cough, hemoptysis, sputum production, shortness of breath and wheezing.   ?Cardiovascular:  Negative for chest pain, palpitations, orthopnea and claudication.  ?Gastrointestinal:  Negative for abdominal pain, blood in stool, constipation, diarrhea, heartburn, melena, nausea and vomiting.  ?Genitourinary:  Negative for dysuria, flank pain, frequency, hematuria and urgency.  ?Musculoskeletal:  Negative for back pain, joint pain and myalgias.  ?Skin:  Negative for rash.  ?Neurological:  Negative for dizziness, tingling, focal weakness, seizures, weakness and headaches.  ?Endo/Heme/Allergies:  Does not bruise/bleed easily.  ?Psychiatric/Behavioral:  Negative for depression and suicidal ideas. The patient does not have insomnia.    ? ?Allergies  ?Allergen Reactions  ? Flagyl [Metronidazole Hcl] Swelling  ?  Swelling in face, mouth, throat, palms of feet and hands.  ? Furosemide Itching  ?  Made side of mouth irritated and puffy.  The rim inside her lip swelled up  ? Macrodantin [Nitrofurantoin Macrocrystal] Anaphylaxis, Hives and Swelling  ? Penicillins Anaphylaxis and Swelling  ?  PATIENT HAD A PCN REACTION WITH IMMEDIATE RASH, FACIAL/TONGUE/THROAT SWELLING, SOB, OR LIGHTHEADEDNESS WITH HYPOTENSION:  #  #  #  YES  #  #  #   ?  Has patient had a PCN reaction causing severe rash involving mucus membranes or  skin necrosis: No ?Has patient had a PCN reaction that required hospitalization: No ?Has patient had a PCN reaction occurring within the last 10 years: No ?If all of the above answers are "NO", then may proceed with Cephalosporin use. ?  ? Celebrex [Celecoxib] Swelling  ?  Swelling in joints.  Did not agree with her. Did not help her either  ? Other   ?  Pt says it was recently discovered she allergic to an ingredient in local anesthesia but she can't recall the name. (epinephrine and the additive used for the biopsy. Gets very nervous. Feels internally shaking. 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 (2018 and 2021)  ? ? ? ?Past Medical History:  ?Diagnosis Date  ? Allergic rhinitis   ? Anemia   ? low iron  ? Anxiety   ? Arthritis 07/2019  ? osteoporosis  ? Asthma without status asthmaticus   ? seasonal, not often  ? Bilateral sciatica   ? Breast cancer ([place]) 07/2019  ? invasive mammary cancer , grade 2  ? Bronchitis   ? Cancer [facility])   ?  follicular variant papillary thyroid cancer  ? Chronic back pain   ? unspecified  ? Chronic kidney disease   ? ckd stage 3b  ? Colon polyp   ? adenomatous  ? DDD (degenerative disc disease), lumbar   ? DJD (degenerative joint disease)   ? Dysphagia   ? Dyspnea   ? E coli bacteremia   ? around 10/11  ? Environmental allergies   ? Family history of breast cancer   ? Family history of colon cancer   ? Family history of ovarian cancer   ? Family history of pancreatic cancer   ? Family history of prostate cancer   ? Family history of thyroid cancer   ? GERD (gastroesophageal reflux disease)   ? Headache   ? migraines as a young woman  ? History of recurrent UTIs   ? Hyperlipidemia   ? Hypertension   ? Hypothyroidism   ? IBS (irritable bowel syndrome)   ? Lumbar compression fracture ([place]) 12/02/2016  ? Lumbar disc disease   ? Mold exposure 2002  ? hx of   ? Osteoporosis, post-menopausal   ? Personal history of radiation therapy   ? Pneumonia 1978  ? Restless legs   ?  Scoliosis   ? Spinal stenosis   ? Thyroid nodule   ? aspirated in 1997 and 2003 which were negative  ? Ulcer   ? Varicose veins of left lower extremity   ? Vertigo   ? Wears dentures   ? ? ? ?Past Surgical History:  ?Procedure Laterality Date  ? ABDOMINAL HYSTERECTOMY  1977  ? Dr. Laverta Baltimore  ? ANKLE FRACTURE SURGERY Left 2003  ? arch of foot.  no metal. per patient, she did not have surgery for this fracture. healed on its own  ? ANTERIOR LAT LUMBAR FUSION Left 09/09/2016  ? Procedure: Left Lumbar two-three Anterolateral lumbar interbody fusion with lateral plate;  Surgeon: Erline Levine, MD;  Location: [place];  Service: Neurosurgery;  Laterality: Left;  Left L2-3 Anterolateral lumbar interbody fusion with lateral plate  ? APPENDECTOMY    ? BACK SURGERY  2013, 2016, 2018  ? lumbar lam, fusion, discectomy.titanium  ? BIOPSY THYROID    ? x2  thyroid nodule ; Dr. Sharlet Salina and one by Dr. Sabra Heck  ?  BREAST BIOPSY Left 08/02/2019  ? coil clip-INVASIVE MAMMARY CARCINOMA  ? BREAST CYST ASPIRATION  1970's  ? BREAST LUMPECTOMY Left 2021  ? BREAST LUMPECTOMY WITH RADIOFREQUENCY TAG IDENTIFICATION Left 07/13/9804  ? Procedure: BREAST LUMPECTOMY WITH RADIOFREQUENCY TAG IDENTIFICATION;  Surgeon: Ronny Bacon, MD;  Location: [facility];  Service: General;  Laterality: Left;  ? BREAST LUMPECTOMY WITH SENTINEL LYMPH NODE BIOPSY Left 08/12/2019  ? Procedure: BREAST LUMPECTOMY WITH SENTINEL LYMPH NODE BX;  Surgeon: Ronny Bacon, MD;  Location: [facility];  Service: General;  Laterality: Left;  ? broken arm Left 1994  ? wrist fracture surgery, left.  metal has been replaced.  ? CARDIOVASCULAR STRESS TEST    ? 2010  ? COLONOSCOPY  2010  ? COLONOSCOPY  07/21/2013  ? PHX OF CP/REPEAT 33YRS/[place]  ? ESOPHAGOGASTRODUODENOSCOPY (EGD) WITH PROPOFOL N/A 09/22/2018  ? Procedure: ESOPHAGOGASTRODUODENOSCOPY (EGD) WITH PROPOFOL;  Surgeon: Toledo, Benay Pike, MD;  Location: [facility];  Service: Gastroenterology;  Laterality: N/A;  Patient to have Rapid  COVID-19 day of procedure Kieth Brightly approved this)  ? EYE SURGERY Bilateral   ? cataract surgery with lens implant  ? FOREIGN BODY REMOVAL N/A 12/09/2019  ? Procedure: FOREIGN BODY REMOVAL ADULT;  Surgeon: Rudene Christians,

## 2021-08-02 ENCOUNTER — Other Ambulatory Visit: Payer: Self-pay

## 2021-08-02 ENCOUNTER — Telehealth: Payer: Self-pay | Admitting: Internal Medicine

## 2021-08-02 ENCOUNTER — Other Ambulatory Visit: Payer: Self-pay | Admitting: Oncology

## 2021-08-02 MED ORDER — IRBESARTAN 300 MG PO TABS: 300.0000 mg | ORAL_TABLET | Freq: Every day | ORAL | 1 refills | Status: DC

## 2021-08-02 NOTE — Telephone Encounter (Signed)
90 days with 1 RF sent to [facility].

## 2021-08-02 NOTE — Telephone Encounter (Signed)
Pt is calling to request a medication refill of irbesartan (AVAPRO) 300 MG tablet [[id number]] needing a 90 day supply . Pt is reporting that she has enough medication for 1 week.  Pt is wanting medications to be sent [facility].

## 2021-08-09 DIAGNOSIS — M4120 Other idiopathic scoliosis, site unspecified: Secondary | ICD-10-CM | POA: Diagnosis not present

## 2021-08-09 DIAGNOSIS — Z6829 Body mass index (BMI) 29.0-29.9, adult: Secondary | ICD-10-CM | POA: Diagnosis not present

## 2021-08-09 DIAGNOSIS — M7062 Trochanteric bursitis, left hip: Secondary | ICD-10-CM | POA: Diagnosis not present

## 2021-08-09 DIAGNOSIS — M5416 Radiculopathy, lumbar region: Secondary | ICD-10-CM | POA: Diagnosis not present

## 2021-08-09 DIAGNOSIS — M7061 Trochanteric bursitis, right hip: Secondary | ICD-10-CM | POA: Diagnosis not present

## 2021-08-09 DIAGNOSIS — M47816 Spondylosis without myelopathy or radiculopathy, lumbar region: Secondary | ICD-10-CM | POA: Diagnosis not present

## 2021-08-09 DIAGNOSIS — M545 Low back pain, unspecified: Secondary | ICD-10-CM | POA: Diagnosis not present

## 2021-08-20 ENCOUNTER — Telehealth: Payer: Self-pay | Admitting: Internal Medicine

## 2021-08-20 NOTE — Telephone Encounter (Signed)
Copied from [place]. Topic: Medicare AWV >> Aug 20, 2021 10:14 AM Cher Nakai R wrote: Reason for CRM:  Left message for patient to call back and schedule Medicare Annual Wellness Visit (AWV) in office.   If unable to come into the office for AWV,  please offer to do virtually or by telephone.  Last AWV:  07/16/2018 awvs per [facility]  Please schedule at anytime with [facility] Advisor.      30 minute appointment for Virtual or phone 45 minute appointment for in office or Initial virtual/phone  Any questions, please call me at [phone]

## 2021-08-22 ENCOUNTER — Ambulatory Visit: Payer: Self-pay

## 2021-08-22 NOTE — Telephone Encounter (Signed)
  Chief Complaint: deer tick bite Symptoms: itching, more prominent joint pain Frequency: yesterday Pertinent Negatives: Patient denies rash, headache, fever Disposition: '[]'$ ED /'[]'$ Urgent Care (no appt availability in office) / '[x]'$ Appointment(In office/virtual)/ '[]'$  [facility]/ '[]'$ Home Care/ '[]'$ Refused Recommended Disposition /'[]'$ [facility]/ '[]'$  Follow-up with PCP Additional Notes: Pt concerned about lyme disease. This is the second tick she has removed in 2 weeks. Pt stated joint pain is more prominent.  Scheduled appt for pt with PCP for tomorrow am.      Reason for Disposition  Deer tick bite with no complications  Answer Assessment - Initial Assessment Questions 1. TYPE of TICK: "Is it a wood tick or a deer tick?" (e.g., deer tick, wood tick; unsure)     Deer tick 2. SIZE of TICK: "How big is the tick?" (e.g., size of poppy seed, apple seed, watermelon seed; unsure) Note: Deer ticks can be the size of a poppy seed (nymph) or an apple seed (adult).       Deer tick 3. ENGORGED: "Did the tick look flat or engorged (full, swollen)?" (e.g., flat, engorged; unsure)     flat 4. LOCATION: "Where is the tick bite located?"      Left posterior upper thigh just below buttocks 5. ONSET: "How long do you think the tick was attached before you removed it?" (e.g., 5 hours, 2 days)      Few itchy 6. APPEARANCE of BITE or RASH: "What does the site look like?"     Red and itchy  Protocols used: Tick Bite-A-AH

## 2021-08-23 ENCOUNTER — Ambulatory Visit (INDEPENDENT_AMBULATORY_CARE_PROVIDER_SITE_OTHER): Payer: Medicare HMO | Admitting: Internal Medicine

## 2021-08-23 ENCOUNTER — Encounter: Payer: Self-pay | Admitting: Internal Medicine

## 2021-08-23 VITALS — BP 138/78 | HR 74 | Ht 63.0 in | Wt 167.0 lb

## 2021-08-23 DIAGNOSIS — W57XXXA Bitten or stung by nonvenomous insect and other nonvenomous arthropods, initial encounter: Secondary | ICD-10-CM

## 2021-08-23 DIAGNOSIS — S70362A Insect bite (nonvenomous), left thigh, initial encounter: Secondary | ICD-10-CM | POA: Diagnosis not present

## 2021-08-23 DIAGNOSIS — M2550 Pain in unspecified joint: Secondary | ICD-10-CM | POA: Diagnosis not present

## 2021-08-23 NOTE — Progress Notes (Addendum)
Date:  08/23/2021   Name:  Latoya Stevenson   DOB:  1941-03-04   MRN:  [id number]   Chief Complaint: Insect Bite (Tick bite, joint pain, hips, knees, feet back, had 2 tick bites over 2 weeks, pulled both ticks off, first tick had a white spot, 2nd tick did not have white spot//)  HPI Pulled a tick off of left leg three weeks ago.  No residual lesion.  She thinks it was there for only an hour or two. Two days ago pulled a nymph off of her left upper thigh.  It was there for about 6 hours.  The spot is itching but she can not see it. She feels well, no fever, rash, chills.  She does have various joint pains which are chronic but she began to wonder if they were worse and associated with the tick bites.  Lab Results  Component Value Date   NA 137 01/21/2021   K 3.9 01/21/2021   CO2 25 01/21/2021   GLUCOSE 103 (H) 01/21/2021   BUN 25 (H) 01/21/2021   CREATININE 1.42 (H) 01/21/2021   CALCIUM 8.6 (L) 01/21/2021   GFRNONAA 38 (L) 01/21/2021   No results found for: "CHOL", "HDL", "LDLCALC", "LDLDIRECT", "TRIG", "CHOLHDL" Lab Results  Component Value Date   TSH 0.073 (L) 06/04/2021   No results found for: "HGBA1C" Lab Results  Component Value Date   WBC 4.6 01/21/2021   HGB 11.9 (L) 01/21/2021   HCT 37.7 01/21/2021   MCV 88.5 01/21/2021   PLT 150 01/21/2021   Lab Results  Component Value Date   ALT 15 01/21/2021   AST 18 01/21/2021   ALKPHOS 74 01/21/2021   BILITOT 0.9 01/21/2021   Lab Results  Component Value Date   VD25OH 32.16 01/21/2021     Review of Systems  Constitutional:  Negative for chills, fatigue and fever.  Respiratory:  Negative for chest tightness and shortness of breath.   Cardiovascular:  Negative for chest pain and palpitations.  Skin:  Negative for color change and rash.  Psychiatric/Behavioral:  Negative for dysphoric mood and sleep disturbance. The patient is not nervous/anxious.     Patient Active Problem List   Diagnosis Date Noted   Malignant  neoplasm of 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 ([place]) 08/04/2019   Esophageal stenosis 01/28/2018   Varicose veins of left leg with edema 07/29/2017   Dysphagia, oropharyngeal 04/30/2017   Lumbar degenerative disc disease 04/30/2017   Spinal stenosis of lumbar region 12/08/2016   Idiopathic scoliosis of lumbar region 09/09/2016   Chronic midline low back pain with right-sided sciatica 07/28/2016   Gastroesophageal reflux disease without esophagitis 07/27/2015   Intermittent vertigo 07/27/2015   Osteoporosis, senile 07/27/2015   Seasonal allergic rhinitis due to pollen 07/27/2015   Acquired hypothyroidism 01/05/2015   Benign essential hypertension 06/29/2014   Mixed hyperlipidemia 06/29/2014   Stage 3b chronic kidney disease ([place]) 06/29/2014   Insomnia 11/11/2013   Mild reactive airways disease 11/11/2013    Allergies  Allergen Reactions   Flagyl [Metronidazole Hcl] Swelling    Swelling in face, mouth, throat, palms of feet and hands.   Furosemide Itching    Made side of mouth irritated and puffy.  The rim inside her lip swelled up   Macrodantin [Nitrofurantoin Macrocrystal] Anaphylaxis, Hives and Swelling   Penicillins Anaphylaxis and Swelling    PATIENT HAD A PCN REACTION WITH IMMEDIATE RASH, FACIAL/TONGUE/THROAT SWELLING, SOB, OR LIGHTHEADEDNESS WITH HYPOTENSION:  #  #  #  YES  #  #  #   Has patient had a PCN reaction causing severe rash involving mucus membranes or skin necrosis: No Has patient had a PCN reaction that required hospitalization: No Has patient had a PCN reaction occurring within the last 10 years: No If all of the above answers are "NO", then may proceed with Cephalosporin use.    Celebrex [Celecoxib] Swelling    Swelling in joints.  Did not agree with her. Did not help her either   Other     Pt says it was recently discovered she allergic to an ingredient in local anesthesia but she can't recall the name. (epinephrine and the additive used for the  biopsy. Gets very nervous. Feels internally shaking. 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 Tito Dine FRACTURE SURGERY Left 2003   arch of foot.  no metal. per patient, she did not have surgery for this fracture. healed on its own   ANTERIOR LAT LUMBAR FUSION Left 09/09/2016   Procedure: Left Lumbar two-three Anterolateral lumbar interbody fusion with lateral plate;  Surgeon: Erline Levine, MD;  Location: [facility];  Service: Neurosurgery;  Laterality: Left;  Left L2-3 Anterolateral lumbar interbody fusion with lateral plate   APPENDECTOMY     BACK SURGERY  2013, 2016, 2018   lumbar lam, fusion, discectomy.titanium   BIOPSY THYROID     x2  thyroid nodule ; Dr. Sharlet Salina and one by Dr. Sabra Heck   BREAST BIOPSY Left 08/02/2019   coil clip-INVASIVE MAMMARY CARCINOMA   BREAST CYST ASPIRATION  1970's   BREAST LUMPECTOMY Left 2021   BREAST LUMPECTOMY WITH RADIOFREQUENCY TAG IDENTIFICATION Left 0/12/2723   Procedure: BREAST LUMPECTOMY WITH RADIOFREQUENCY TAG IDENTIFICATION;  Surgeon: Ronny Bacon, MD;  Location: [facility];  Service: General;  Laterality: Left;   BREAST LUMPECTOMY WITH SENTINEL LYMPH NODE BIOPSY Left 08/12/2019   Procedure: BREAST LUMPECTOMY WITH SENTINEL LYMPH NODE BX;  Surgeon: Ronny Bacon, MD;  Location: [facility];  Service: General;  Laterality: Left;   broken arm Left 1994   wrist fracture surgery, left.  metal has been replaced.   CARDIOVASCULAR STRESS TEST     2010   COLONOSCOPY  2010   COLONOSCOPY  07/21/2013   PHX OF CP/REPEAT 67YRS/[place]   ESOPHAGOGASTRODUODENOSCOPY (EGD) WITH PROPOFOL N/A 09/22/2018   Procedure: ESOPHAGOGASTRODUODENOSCOPY (EGD) WITH PROPOFOL;  Surgeon: Toledo, Benay Pike, MD;  Location: [facility];  Service: Gastroenterology;  Laterality: N/A;  Patient to have Rapid COVID-19 day of procedure Kieth Brightly approved this)   EYE SURGERY  Bilateral    cataract surgery with lens implant   FOREIGN BODY REMOVAL N/A 12/09/2019   Procedure: FOREIGN BODY REMOVAL ADULT;  Surgeon: Hessie Knows, MD;  Location: [facility];  Service: Orthopedics;  Laterality: N/A;   INCISION AND DRAINAGE Left 12/09/2019   Procedure: INCISION AND DRAINAGE;  Surgeon: Hessie Knows, MD;  Location: [facility];  Service: Orthopedics;  Laterality: Left;   LACERATION REPAIR Bilateral 12/09/2019   Procedure: REPAIR MULTIPLE LACERATIONS;  Surgeon: Hessie Knows, MD;  Location: [facility];  Service: Orthopedics;  Laterality: Bilateral;   LUMBAR LAMINECTOMY/DECOMPRESSION MICRODISCECTOMY  11/18/2011   Procedure: LUMBAR LAMINECTOMY/DECOMPRESSION MICRODISCECTOMY 2 LEVELS;  Surgeon: Erline Levine, MD;  Location: [facility];  Service: Neurosurgery;  Laterality: N/A;  Lumbar Three-Four,  Lumbar Four-Five Decompressive Laminectomy   MALONEY DILATION N/A 09/22/2018  Procedure: MALONEY DILATION;  Surgeon: Toledo, Benay Pike, MD;  Location: [facility];  Service: Gastroenterology;  Laterality: N/A;   OVARIAN CYST SURGERY     POSTERIOR FUSION LUMBAR SPINE  2016   TOTAL THYROIDECTOMY     TRANSTHORACIC ECHOCARDIOGRAM  2010   upper and lower GI  2010    Social History   Tobacco Use   Smoking status: Never   Smokeless tobacco: Never  Vaping Use   Vaping Use: Never used  Substance Use Topics   Alcohol use: Yes    Comment: socially-rare   Drug use: No     Medication list has been reviewed and updated.  Current Meds  Medication Sig   anastrozole (ARIMIDEX) 1 MG tablet TAKE 1 TABLET EVERY DAY   atorvastatin (LIPITOR) 40 MG tablet Take 40 mg by mouth at bedtime.    Calcium Carbonate-Vitamin D 600-400 MG-UNIT tablet Take 2 tablets by mouth daily.   chlorthalidone (HYGROTON) 25 MG tablet Take 25 mg by mouth in the morning and at bedtime.    cholecalciferol (VITAMIN D3) 25 MCG (1000 UNIT) tablet Take 1,000 Units by mouth daily.   denosumab (PROLIA) 60 MG/ML SOSY injection  Inject 60 mg into the skin every 6 (six) months.   fexofenadine (ALLEGRA) 180 MG tablet Take 180 mg by mouth daily.   fluticasone (FLONASE) 50 MCG/ACT nasal spray Place 2 sprays into both nostrils daily as needed for allergies.   gabapentin (NEURONTIN) 300 MG capsule Take 300 mg by mouth at bedtime.   GLUCOSAMINE-CHONDROITIN PO Take 1 tablet by mouth daily.    irbesartan (AVAPRO) 300 MG tablet Take 1 tablet (300 mg total) by mouth daily.   levothyroxine (SYNTHROID) 150 MCG tablet Take 150 mcg by mouth daily before breakfast.   Multiple Vitamin (MULTIVITAMIN WITH MINERALS) TABS Take 1 tablet by mouth daily.   omeprazole (PRILOSEC) 40 MG capsule Take 40 mg by mouth daily before breakfast.   vitamin E 180 MG (400 UNITS) capsule Take 400 Units by mouth daily.   [DISCONTINUED] amLODipine (NORVASC) 5 MG tablet Take 5 mg by mouth daily. Takes in the evening       08/23/2021    9:08 AM 06/04/2021    2:51 PM  GAD 7 : Generalized Anxiety Score  Nervous, Anxious, on Edge 1 0  Control/stop worrying 0 0  Worry too much - different things 0 0  Trouble relaxing 0 0  Restless 0 0  Easily annoyed or irritable 0 0  Afraid - awful might happen 0 0  Total GAD 7 Score 1 0  Anxiety Difficulty Not difficult at all Not difficult at all       08/23/2021    9:08 AM  Depression screen PHQ 2/9  Decreased Interest 0  Down, Depressed, Hopeless 0  PHQ - 2 Score 0  Altered sleeping 1  Tired, decreased energy 0  Change in appetite 0  Feeling bad or failure about yourself  0  Trouble concentrating 0  Moving slowly or fidgety/restless 0  Suicidal thoughts 0  PHQ-9 Score 1  Difficult doing work/chores Not difficult at all    BP Readings from Last 3 Encounters:  08/23/21 138/78  07/22/21 (!) 143/80  06/04/21 122/70    Physical Exam Vitals and nursing note reviewed.  Constitutional:      General: She is not in acute distress.    Appearance: She is well-developed.  HENT:     Head: Normocephalic and  atraumatic.  Cardiovascular:     Rate  and Rhythm: Normal rate and regular rhythm.  Pulmonary:     Effort: Pulmonary effort is normal. No respiratory distress.     Breath sounds: No wheezing or rhonchi.  Musculoskeletal:     Cervical back: Normal range of motion.  Skin:    General: Skin is warm and dry.     Findings: No rash.       Neurological:     Mental Status: She is alert and oriented to person, place, and time.  Psychiatric:        Mood and Affect: Mood normal.        Behavior: Behavior normal.     Wt Readings from Last 3 Encounters:  08/23/21 167 lb (75.8 kg)  07/22/21 168 lb 11.2 oz (76.5 kg)  06/04/21 170 lb 6.4 oz (77.3 kg)    BP 138/78   Pulse 74   Ht '5\' 3"'$  (1.6 m)   Wt 167 lb (75.8 kg)   SpO2 97%   BMI 29.58 kg/m   Assessment and Plan: 1. Arthralgia, unspecified joint Multiple joints with OA, chronic low back pain s/p surgery and now considering ESI. None of this is concerning for Lyme arthritis.  2. Tick bite of left thigh, initial encounter No evidence of retained tick from removal on 08/21/21 No rash or fever to suggest tick borne illness Sign and symptoms discussed - pt will monitor for occurrence Recommend regular skin checks and insect repellent   Partially dictated using Editor, commissioning. Any errors are unintentional.  [name], MD [facility]  08/23/2021

## 2021-08-26 DIAGNOSIS — M5416 Radiculopathy, lumbar region: Secondary | ICD-10-CM | POA: Diagnosis not present

## 2021-09-03 ENCOUNTER — Other Ambulatory Visit: Payer: Self-pay

## 2021-09-03 DIAGNOSIS — E782 Mixed hyperlipidemia: Secondary | ICD-10-CM

## 2021-09-03 MED ORDER — ATORVASTATIN CALCIUM 40 MG PO TABS: 40.0000 mg | ORAL_TABLET | Freq: Every day | ORAL | 0 refills | Status: DC

## 2021-09-23 IMAGING — MR MR BREAST BILAT WO/W CM
2 of 9 series · 6 of 48 positions shown · IV contrast (gadavist)
Comparison: None.

CLINICAL DATA: 78-year-old female presenting with right nipple
discharge for 2-3 months. No discharge the prior 2 weeks. No history
of cancer. Family history of breast cancer in her sister.

LABS:  None.
EXAM:
BILATERAL BREAST MRI WITH AND WITHOUT CONTRAST
TECHNIQUE: Multiplanar, multisequence MR images of both breasts were obtained
prior to and following the intravenous administration of 8 ml of
Gadavist

[Series 4: T1 · axial · B · 1.5mm · 0.97mm/px · z∈[-92,+75]mm · 5 of 112 slices shown]
[im 1/112]
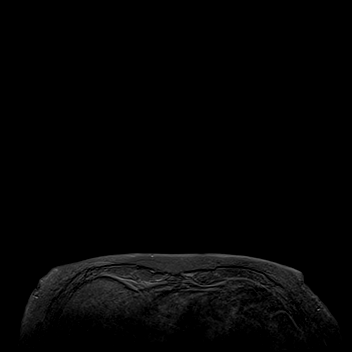
[im 28/112]
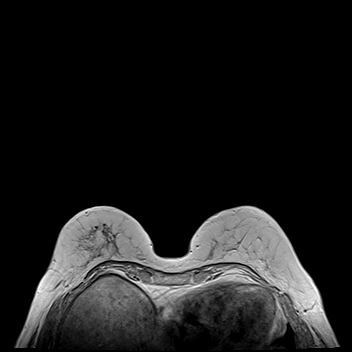
[im 56/112]
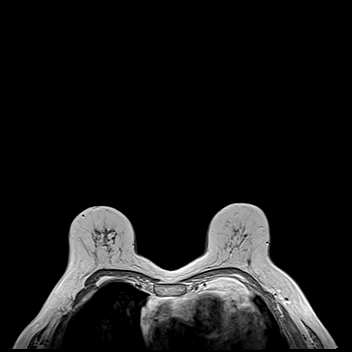
[im 84/112]
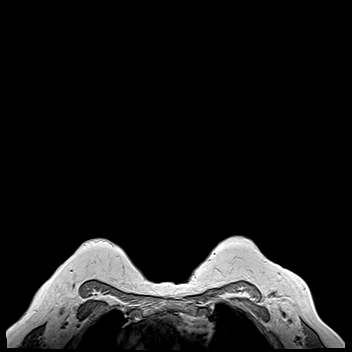
[im 112/112]
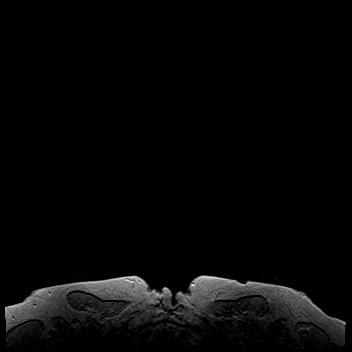

[Series 5: T2 · axial · B · 3.0mm · 0.97mm/px · 1 of 45 slices shown]
[im 1/45]
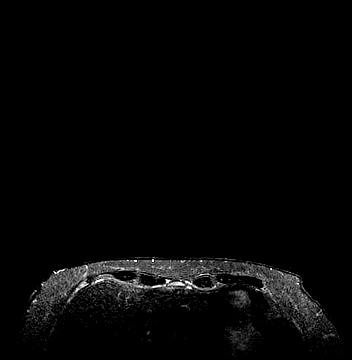

[6 of 48 positions shown; findings below may reference images not displayed]

Three-dimensional MR images were rendered by post-processing of the
original MR data on an independent workstation. The
three-dimensional MR images were interpreted, and findings are
reported in the following complete MRI report for this study. Three
dimensional images were evaluated at the independent [employer]
workstation
FINDINGS: Breast composition: c. Heterogeneous fibroglandular tissue.

Background parenchymal enhancement: Mild

Right breast: No mass or abnormal enhancement.

Left breast: In the lower slightly inner left breast there is an
enhancing mass measuring 0.9 x 0.6 x 0.7 cm (series 13, image 32).
There are no other suspicious areas of enhancement in the left
breast.

Lymph nodes: There is a lymph node in the left axilla with possible
mild cortical thickening measuring 5 mm (series 13, image 96). There
are no suspicious right axillary lymph nodes.

Ancillary findings:  None.
IMPRESSION: 1. Enhancing mass measuring 0.9 cm in the lower slightly inner left
breast is suspicious.
2. Left axillary lymph node with possible mild cortical thickening
measuring up to 5 mm.
3. No MRI evidence of malignancy in the right breast.

RECOMMENDATION:
1. Second-look ultrasound and possible biopsy of the suspicious mass
in the left breast. If the mass cannot be located sonographically,
recommend MRI guided biopsy of the left breast mass.

2. Second-look ultrasound of the left axilla for a lymph node with
possible mild cortical thickening. If an abnormal lymph node is
identified, recommend ultrasound-guided core needle biopsy.

BI-RADS CATEGORY  4: Suspicious.

## 2021-10-07 ENCOUNTER — Ambulatory Visit (INDEPENDENT_AMBULATORY_CARE_PROVIDER_SITE_OTHER): Payer: Medicare HMO | Admitting: Internal Medicine

## 2021-10-07 ENCOUNTER — Encounter: Payer: Self-pay | Admitting: Internal Medicine

## 2021-10-07 VITALS — BP 120/72 | HR 66 | Ht 63.0 in | Wt 172.2 lb

## 2021-10-07 DIAGNOSIS — E039 Hypothyroidism, unspecified: Secondary | ICD-10-CM | POA: Diagnosis not present

## 2021-10-07 DIAGNOSIS — K219 Gastro-esophageal reflux disease without esophagitis: Secondary | ICD-10-CM | POA: Diagnosis not present

## 2021-10-07 DIAGNOSIS — Z0000 Encounter for general adult medical examination without abnormal findings: Secondary | ICD-10-CM | POA: Diagnosis not present

## 2021-10-07 DIAGNOSIS — Z23 Encounter for immunization: Secondary | ICD-10-CM | POA: Diagnosis not present

## 2021-10-07 DIAGNOSIS — Z1159 Encounter for screening for other viral diseases: Secondary | ICD-10-CM | POA: Diagnosis not present

## 2021-10-07 DIAGNOSIS — C50312 Malignant neoplasm of 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: Secondary | ICD-10-CM | POA: Diagnosis not present

## 2021-10-07 DIAGNOSIS — H109 Unspecified conjunctivitis: Secondary | ICD-10-CM | POA: Diagnosis not present

## 2021-10-07 DIAGNOSIS — I10 Essential (primary) hypertension: Secondary | ICD-10-CM

## 2021-10-07 DIAGNOSIS — E782 Mixed hyperlipidemia: Secondary | ICD-10-CM | POA: Diagnosis not present

## 2021-10-07 DIAGNOSIS — N1832 Chronic kidney disease, stage 3b: Secondary | ICD-10-CM

## 2021-10-07 MED ORDER — TOBRAMYCIN-DEXAMETHASONE 0.3-0.1 % OP SUSP
2.0000 [drp] | Freq: Four times a day (QID) | OPHTHALMIC | 0 refills | Status: DC
Start: 2021-10-07 — End: 2021-10-29

## 2021-10-07 MED ORDER — LEVOTHYROXINE SODIUM 150 MCG PO TABS
150.0000 ug | ORAL_TABLET | Freq: Every day | ORAL | 0 refills | Status: DC
Start: 2021-10-07 — End: 2021-10-08

## 2021-10-07 MED ORDER — LEVOTHYROXINE SODIUM 150 MCG PO TABS: 150.0000 ug | ORAL_TABLET | Freq: Every day | ORAL | 1 refills | Status: DC

## 2021-10-07 NOTE — Patient Instructions (Addendum)
See Dermatology for skin cancer survey.    Schedule annual Eye exam with Ophthalmologist of your choice.  Latoya Stevenson will be calling you to schedule the Mammogram.  If you want to get the shingles vaccine (Shingrix) you can go to your local pharmacy.

## 2021-10-07 NOTE — Progress Notes (Signed)
Date:  10/07/2021   Name:  Latoya Stevenson   DOB:  08/08/1940   MRN:  [id number]   Chief Complaint: Annual Exam Latoya Stevenson is a 81 y.o. female who presents today for her Complete Annual Exam. She feels well. She reports doing yard work. She reports she is sleeping poorly. Breast complaints - none. She had MAW in January with her previous PCP.  Mammogram: 01/2021 DEXA: 11/2019 Pap smear: discontinued Colonoscopy: 02/2021  Health Maintenance Due  Topic Date Due   Hepatitis C Screening  Never done   Zoster Vaccines- Shingrix (1 of 2) Never done   DEXA SCAN  Never done   Pneumonia Vaccine 77+ Years old (2 - PPSV23 or PCV20) 07/31/2017   COVID-19 Vaccine (4 - Booster for Pfizer series) 05/29/2020    Immunization History  Administered Date(s) Administered   Influenza Split 02/15/2014, 01/05/2015   Influenza,inj,Quad PF,6+ Mos 03/08/2019   Influenza-Unspecified 01/28/2017, 02/12/2021   PFIZER(Purple Top)SARS-COV-2 Vaccination 05/31/2019, 06/21/2019, 04/03/2020   Pneumococcal Conjugate-13 07/31/2016   Tdap 08/09/2014    Hypertension Pertinent negatives include no chest pain, headaches, palpitations or shortness of breath. Identifiable causes of hypertension include a thyroid problem.  Thyroid Problem Presents for follow-up visit. Patient reports no anxiety, constipation, diarrhea, fatigue, palpitations or tremors. The symptoms have been stable.  Gastroesophageal Reflux She reports no abdominal pain, no chest pain, no coughing or no wheezing. Pertinent negatives include no fatigue.  Conjunctivitis  The current episode started more than 2 weeks ago. The onset was sudden. The problem has been unchanged. Nothing aggravates the symptoms. Associated symptoms include eye discharge and eye redness. Pertinent negatives include no fever, no abdominal pain, no constipation, no diarrhea, no vomiting, no congestion, no headaches, no hearing loss, no cough, no wheezing and no rash.  OP - on  Prolia for treatment from Dr. 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  Has not seen Derm in several years.  Lab Results  Component Value Date   NA 137 01/21/2021   K 3.9 01/21/2021   CO2 25 01/21/2021   GLUCOSE 103 (H) 01/21/2021   BUN 25 (H) 01/21/2021   CREATININE 1.42 (H) 01/21/2021   CALCIUM 8.6 (L) 01/21/2021   GFRNONAA 38 (L) 01/21/2021   No results found for: "CHOL", "HDL", "LDLCALC", "LDLDIRECT", "TRIG", "CHOLHDL" Lab Results  Component Value Date   TSH 0.073 (L) 06/04/2021   No results found for: "HGBA1C" Lab Results  Component Value Date   WBC 4.6 01/21/2021   HGB 11.9 (L) 01/21/2021   HCT 37.7 01/21/2021   MCV 88.5 01/21/2021   PLT 150 01/21/2021   Lab Results  Component Value Date   ALT 15 01/21/2021   AST 18 01/21/2021   ALKPHOS 74 01/21/2021   BILITOT 0.9 01/21/2021   Lab Results  Component Value Date   VD25OH 32.16 01/21/2021     Review of Systems  Constitutional:  Negative for chills, fatigue and fever.  HENT:  Negative for congestion, hearing loss, tinnitus, trouble swallowing and voice change.   Eyes:  Positive for discharge and redness. Negative for visual disturbance.  Respiratory:  Negative for cough, chest tightness, shortness of breath and wheezing.   Cardiovascular:  Positive for leg swelling. Negative for chest pain and palpitations.  Gastrointestinal:  Negative for abdominal pain, constipation, diarrhea and vomiting.  Endocrine: Negative for polydipsia and polyuria.  Genitourinary:  Negative for dysuria, frequency, genital sores, vaginal bleeding and vaginal discharge.  Musculoskeletal:  Positive  for arthralgias (hands and knee) and back pain. Negative for gait problem and joint swelling.  Skin:  Negative for color change and rash.       Several facial lesions w/hx of SCCA nose  Neurological:  Negative for dizziness, tremors, light-headedness and headaches.  Hematological:  Negative for adenopathy. Does not bruise/bleed  easily.  Psychiatric/Behavioral:  Negative for dysphoric mood and sleep disturbance. The patient is not nervous/anxious.     Patient Active Problem List   Diagnosis Date Noted   Malignant neoplasm of 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 ([place]) 08/04/2019   Esophageal stenosis 01/28/2018   Varicose veins of left leg with edema 07/29/2017   Dysphagia, oropharyngeal 04/30/2017   Lumbar degenerative disc disease 04/30/2017   Spinal stenosis of lumbar region 12/08/2016   Idiopathic scoliosis of lumbar region 09/09/2016   Chronic midline low back pain with right-sided sciatica 07/28/2016   Gastroesophageal reflux disease without esophagitis 07/27/2015   Intermittent vertigo 07/27/2015   Osteoporosis, senile 07/27/2015   Seasonal allergic rhinitis due to pollen 07/27/2015   Acquired hypothyroidism 01/05/2015   Benign essential hypertension 06/29/2014   Mixed hyperlipidemia 06/29/2014   Stage 3b chronic kidney disease ([place]) 06/29/2014   Insomnia 11/11/2013   Mild reactive airways disease 11/11/2013    Allergies  Allergen Reactions   Flagyl [Metronidazole Hcl] Swelling    Swelling in face, mouth, throat, palms of feet and hands.   Furosemide Itching    Made side of mouth irritated and puffy.  The rim inside her lip swelled up   Macrodantin [Nitrofurantoin Macrocrystal] Anaphylaxis, Hives and Swelling   Penicillins Anaphylaxis and Swelling    PATIENT HAD A PCN REACTION WITH IMMEDIATE RASH, FACIAL/TONGUE/THROAT SWELLING, SOB, OR LIGHTHEADEDNESS WITH HYPOTENSION:  #  #  #  YES  #  #  #   Has patient had a PCN reaction causing severe rash involving mucus membranes or skin necrosis: No Has patient had a PCN reaction that required hospitalization: No Has patient had a PCN reaction occurring within the last 10 years: No If all of the above answers are "NO", then may proceed with Cephalosporin use.    Celebrex [Celecoxib] Swelling    Swelling in joints.  Did not agree with her. Did not  help her either   Other     Pt says it was recently discovered she allergic to an ingredient in local anesthesia but she can't recall the name. (epinephrine and the additive used for the biopsy. Gets very nervous. Feels internally shaking. 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 Tito Dine FRACTURE SURGERY Left 2003   arch of foot.  no metal. per patient, she did not have surgery for this fracture. healed on its own   ANTERIOR LAT LUMBAR FUSION Left 09/09/2016   Procedure: Left Lumbar two-three Anterolateral lumbar interbody fusion with lateral plate;  Surgeon: Erline Levine, MD;  Location: [place];  Service: Neurosurgery;  Laterality: Left;  Left L2-3 Anterolateral lumbar interbody fusion with lateral plate   APPENDECTOMY     BACK SURGERY  2013, 2016, 2018   lumbar lam, fusion, discectomy.titanium   BIOPSY THYROID     x2  thyroid nodule ; Dr. Sharlet Salina and one by Dr. Sabra Heck   BREAST BIOPSY Left 08/02/2019   coil clip-INVASIVE MAMMARY CARCINOMA   BREAST CYST ASPIRATION  1970's  BREAST LUMPECTOMY Left 2021   BREAST LUMPECTOMY WITH RADIOFREQUENCY TAG IDENTIFICATION Left 0/09/6224   Procedure: BREAST LUMPECTOMY WITH RADIOFREQUENCY TAG IDENTIFICATION;  Surgeon: Ronny Bacon, MD;  Location: [facility];  Service: General;  Laterality: Left;   BREAST LUMPECTOMY WITH SENTINEL LYMPH NODE BIOPSY Left 08/12/2019   Procedure: BREAST LUMPECTOMY WITH SENTINEL LYMPH NODE BX;  Surgeon: Ronny Bacon, MD;  Location: [facility];  Service: General;  Laterality: Left;   broken arm Left 1994   wrist fracture surgery, left.  metal has been replaced.   CARDIOVASCULAR STRESS TEST     2010   COLONOSCOPY  2010   COLONOSCOPY  07/21/2013   PHX OF CP/REPEAT 46YRS/[place]   ESOPHAGOGASTRODUODENOSCOPY (EGD) WITH PROPOFOL N/A 09/22/2018   Procedure: ESOPHAGOGASTRODUODENOSCOPY (EGD) WITH PROPOFOL;   Surgeon: Toledo, Benay Pike, MD;  Location: [facility];  Service: Gastroenterology;  Laterality: N/A;  Patient to have Rapid COVID-19 day of procedure Kieth Brightly approved this)   EYE SURGERY Bilateral    cataract surgery with lens implant   FOREIGN BODY REMOVAL N/A 12/09/2019   Procedure: FOREIGN BODY REMOVAL ADULT;  Surgeon: Hessie Knows, MD;  Location: [facility];  Service: Orthopedics;  Laterality: N/A;   INCISION AND DRAINAGE Left 12/09/2019   Procedure: INCISION AND DRAINAGE;  Surgeon: Hessie Knows, MD;  Location: [facility];  Service: Orthopedics;  Laterality: Left;   LACERATION REPAIR Bilateral 12/09/2019   Procedure: REPAIR MULTIPLE LACERATIONS;  Surgeon: Hessie Knows, MD;  Location: [facility];  Service: Orthopedics;  Laterality: Bilateral;   LUMBAR LAMINECTOMY/DECOMPRESSION MICRODISCECTOMY  11/18/2011   Procedure: LUMBAR LAMINECTOMY/DECOMPRESSION MICRODISCECTOMY 2 LEVELS;  Surgeon: Erline Levine, MD;  Location: [facility];  Service: Neurosurgery;  Laterality: N/A;  Lumbar Three-Four,  Lumbar Four-Five Decompressive Laminectomy   MALONEY DILATION N/A 09/22/2018   Procedure: MALONEY DILATION;  Surgeon: Toledo, Benay Pike, MD;  Location: [facility];  Service: Gastroenterology;  Laterality: N/A;   OVARIAN CYST SURGERY     POSTERIOR FUSION LUMBAR SPINE  2016   TOTAL THYROIDECTOMY     TRANSTHORACIC ECHOCARDIOGRAM  2010   upper and lower GI  2010    Social History   Tobacco Use   Smoking status: Never   Smokeless tobacco: Never  Vaping Use   Vaping Use: Never used  Substance Use Topics   Alcohol use: Yes    Comment: socially-rare   Drug use: No     Medication list has been reviewed and updated.  Current Meds  Medication Sig   acetaminophen (TYLENOL) 650 MG CR tablet Take by mouth.   amLODipine (NORVASC) 2.5 MG tablet Take 2.5 mg by mouth daily.   anastrozole (ARIMIDEX) 1 MG tablet TAKE 1 TABLET EVERY DAY   atorvastatin (LIPITOR) 40 MG tablet Take 1 tablet (40 mg total) by mouth  at bedtime.   Calcium Carbonate-Vitamin D 600-400 MG-UNIT tablet Take 2 tablets by mouth daily.   chlorthalidone (HYGROTON) 25 MG tablet Take 25 mg by mouth in the morning and at bedtime.    cholecalciferol (VITAMIN D3) 25 MCG (1000 UNIT) tablet Take 1,000 Units by mouth daily.   denosumab (PROLIA) 60 MG/ML SOSY injection Inject 60 mg into the skin every 6 (six) months.   fexofenadine (ALLEGRA) 180 MG tablet Take 180 mg by mouth daily.   fluticasone (FLONASE) 50 MCG/ACT nasal spray Place 2 sprays into both nostrils daily as needed for allergies.   gabapentin (NEURONTIN) 300 MG capsule Take 300 mg by mouth at bedtime.   GLUCOSAMINE-CHONDROITIN PO Take 1 tablet by mouth daily.  irbesartan (AVAPRO) 300 MG tablet Take 1 tablet (300 mg total) by mouth daily.   levothyroxine (SYNTHROID) 150 MCG tablet Take 150 mcg by mouth daily before breakfast.   Multiple Vitamin (MULTIVITAMIN WITH MINERALS) TABS Take 1 tablet by mouth daily.   omeprazole (PRILOSEC) 40 MG capsule Take 40 mg by mouth daily before breakfast.   vitamin E 180 MG (400 UNITS) capsule Take 400 Units by mouth daily.       10/07/2021   10:53 AM 08/23/2021    9:08 AM 06/04/2021    2:51 PM  GAD 7 : Generalized Anxiety Score  Nervous, Anxious, on Edge 0 1 0  Control/stop worrying 0 0 0  Worry too much - different things 0 0 0  Trouble relaxing 0 0 0  Restless 0 0 0  Easily annoyed or irritable 0 0 0  Afraid - awful might happen 0 0 0  Total GAD 7 Score 0 1 0  Anxiety Difficulty Not difficult at all Not difficult at all Not difficult at all       10/07/2021   10:53 AM 08/23/2021    9:08 AM 06/04/2021    2:50 PM  Depression screen PHQ 2/9  Decreased Interest 0 0 0  Down, Depressed, Hopeless 0 0 0  PHQ - 2 Score 0 0 0  Altered sleeping '2 1 2  '$ Tired, decreased energy 0 0 2  Change in appetite 0 0 2  Feeling bad or failure about yourself  0 0 0  Trouble concentrating 0 0 0  Moving slowly or fidgety/restless 0 0 0  Suicidal  thoughts 0 0 0  PHQ-9 Score '2 1 6  '$ Difficult doing work/chores  Not difficult at all Not difficult at all    BP Readings from Last 3 Encounters:  10/07/21 120/72  08/23/21 138/78  07/22/21 (!) 143/80    Physical Exam Vitals and nursing note reviewed.  Constitutional:      General: She is not in acute distress.    Appearance: She is well-developed.  HENT:     Head: Normocephalic and atraumatic.     Right Ear: Tympanic membrane and ear canal normal.     Left Ear: Tympanic membrane and ear canal normal.     Nose:     Right Sinus: No maxillary sinus tenderness.     Left Sinus: No maxillary sinus tenderness.  Eyes:     General: No scleral icterus.       Right eye: No discharge.        Left eye: No discharge.     Conjunctiva/sclera: Conjunctivae normal.  Neck:     Thyroid: No thyromegaly.     Vascular: No carotid bruit.  Cardiovascular:     Rate and Rhythm: Normal rate and regular rhythm.     Pulses: Normal pulses.     Heart sounds: Normal heart sounds.  Pulmonary:     Effort: Pulmonary effort is normal. No respiratory distress.     Breath sounds: No wheezing.  Chest:  Breasts:    Right: No mass, nipple discharge, skin change or tenderness.     Left: No mass, nipple discharge, skin change or tenderness.  Abdominal:     General: Bowel sounds are normal.     Palpations: Abdomen is soft.     Tenderness: There is no abdominal tenderness.  Musculoskeletal:        General: Normal range of motion.     Cervical back: Normal range of motion. No erythema.  Right lower leg: No edema.     Left lower leg: Edema (mild non pitting edema - chronic appearing) present.  Lymphadenopathy:     Cervical: No cervical adenopathy.  Skin:    General: Skin is warm and dry.     Capillary Refill: Capillary refill takes less than 2 seconds.     Findings: No rash.  Neurological:     General: No focal deficit present.     Mental Status: She is alert and oriented to person, place, and time.      Cranial Nerves: No cranial nerve deficit.     Sensory: No sensory deficit.     Deep Tendon Reflexes: Reflexes are normal and symmetric.  Psychiatric:        Attention and Perception: Attention normal.        Mood and Affect: Mood normal.     Wt Readings from Last 3 Encounters:  10/07/21 172 lb 3.2 oz (78.1 kg)  08/23/21 167 lb (75.8 kg)  07/22/21 168 lb 11.2 oz (76.5 kg)    BP 120/72   Pulse 66   Ht '5\' 3"'$  (1.6 m)   Wt 172 lb 3.2 oz (78.1 kg)   SpO2 98%   BMI 30.50 kg/m   Assessment and Plan: 1. Annual physical exam Exam is normal except for weight. Encourage regular exercise and appropriate dietary changes.  OP treated by Endo with DEXA scheduled Recommend Eye exam, Dermatology skin survey Shingrix at the local pharmacy if desired  2. Benign essential hypertension Clinically stable exam with well controlled BP. Tolerating medications without side effects at this time. Pt to continue current regimen and low sodium diet; benefits of regular exercise as able discussed. Mild chronic LLE edema; nonpitting.  No indication for additional medication.  Continue ARB - CBC with Differential/Platelet - Comprehensive metabolic panel - Urinalysis, Routine w reflex microscopic  3. Malignant neoplasm of 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, unspecified estrogen receptor status ([place]) Followed by Oncology Dr. Janese Banks and Dr. Christian Mate She does not need to see the surgeon unless surgery is needed Continue annual Dx Mammo due this fall - MM DIAG BREAST TOMO BILATERAL; Future  4. Conjunctivitis of both eyes, unspecified conjunctivitis type - tobramycin-dexamethasone (TOBRADEX) ophthalmic solution; Place 2 drops into both eyes every 6 (six) hours.  Dispense: 5 mL; Refill: 0  5. Gastroesophageal reflux disease without esophagitis Symptoms well controlled on daily PPI No red flag signs such as weight loss, n/v, melena Will continue omeprazole.  6. Acquired  hypothyroidism Supplemented - will check labs and adjust dose if needed - levothyroxine (SYNTHROID) 150 MCG tablet; Take 1 tablet (150 mcg total) by mouth daily before breakfast.  Dispense: 90 tablet; Refill: 1 - levothyroxine (SYNTHROID) 150 MCG tablet; Take 1 tablet (150 mcg total) by mouth daily.  Dispense: 30 tablet; Refill: 0 - TSH + free T4  7. Stage 3b chronic kidney disease ([place]) Continue monitor regularly Avoid NSAIDS; take tylenol as needed  8. Mixed hyperlipidemia Tolerating statin medication without side effects at this time LDL is at goal of < 70 on current dose Continue same therapy without change at this time. - Lipid panel  9. Need for hepatitis C screening test - Hepatitis C antibody  10. Need for vaccination for pneumococcus Given today - Pneumococcal conjugate vaccine 20-valent   Partially dictated using Editor, commissioning. Any errors are unintentional.  [name], MD [facility]  10/07/2021

## 2021-10-08 ENCOUNTER — Other Ambulatory Visit: Payer: Self-pay | Admitting: Internal Medicine

## 2021-10-08 LAB — COMPREHENSIVE METABOLIC PANEL
ALT: 16 IU/L (ref 0–32)
AST: 17 IU/L (ref 0–40)
Albumin/Globulin Ratio: 1.8 (ref 1.2–2.2)
Albumin: 4.2 g/dL (ref 3.8–4.8)
Alkaline Phosphatase: 76 IU/L (ref 44–121)
BUN/Creatinine Ratio: 22 (ref 12–28)
BUN: 28 mg/dL — ABNORMAL HIGH (ref 8–27)
Bilirubin Total: 0.7 mg/dL (ref 0.0–1.2)
CO2: 22 mmol/L (ref 20–29)
Calcium: 10 mg/dL (ref 8.7–10.3)
Chloride: 107 mmol/L — ABNORMAL HIGH (ref 96–106)
Creatinine, Ser: 1.26 mg/dL — ABNORMAL HIGH (ref 0.57–1.00)
Globulin, Total: 2.4 g/dL (ref 1.5–4.5)
Glucose: 94 mg/dL (ref 70–99)
Potassium: 5 mmol/L (ref 3.5–5.2)
Sodium: 144 mmol/L (ref 134–144)
Total Protein: 6.6 g/dL (ref 6.0–8.5)
eGFR: 43 mL/min/{1.73_m2} — ABNORMAL LOW (ref >59)

## 2021-10-08 LAB — CBC WITH DIFFERENTIAL/PLATELET
Basophils Absolute: 0 10*3/uL (ref 0.0–0.2)
Basos: 1 %
EOS (ABSOLUTE): 0.1 10*3/uL (ref 0.0–0.4)
Eos: 2 %
Hematocrit: 36.9 % (ref 34.0–46.6)
Hemoglobin: 12 g/dL (ref 11.1–15.9)
Immature Grans (Abs): 0 10*3/uL (ref 0.0–0.1)
Immature Granulocytes: 0 %
Lymphocytes Absolute: 1.1 10*3/uL (ref 0.7–3.1)
Lymphs: 21 %
MCH: 28.4 pg (ref 26.6–33.0)
MCHC: 32.5 g/dL (ref 31.5–35.7)
MCV: 87 fL (ref 79–97)
Monocytes Absolute: 0.6 10*3/uL (ref 0.1–0.9)
Monocytes: 11 %
Neutrophils Absolute: 3.5 10*3/uL (ref 1.4–7.0)
Neutrophils: 65 %
Platelets: 176 10*3/uL (ref 150–450)
RBC: 4.22 x10E6/uL (ref 3.77–5.28)
RDW: 13 % (ref 11.7–15.4)
WBC: 5.4 10*3/uL (ref 3.4–10.8)

## 2021-10-08 LAB — URINALYSIS, ROUTINE W REFLEX MICROSCOPIC
Bilirubin, UA: NEGATIVE
Glucose, UA: NEGATIVE
Ketones, UA: NEGATIVE
Leukocytes,UA: NEGATIVE
Nitrite, UA: NEGATIVE
Protein,UA: NEGATIVE
RBC, UA: NEGATIVE
Specific Gravity, UA: 1.011 (ref 1.005–1.030)
Urobilinogen, Ur: 0.2 mg/dL (ref 0.2–1.0)
pH, UA: 7 (ref 5.0–7.5)

## 2021-10-08 LAB — LIPID PANEL
Chol/HDL Ratio: 2.8 ratio (ref 0.0–4.4)
Cholesterol, Total: 154 mg/dL (ref 100–199)
HDL: 55 mg/dL (ref >39)
LDL Chol Calc (NIH): 85 mg/dL (ref 0–99)
Triglycerides: 70 mg/dL (ref 0–149)
VLDL Cholesterol Cal: 14 mg/dL (ref 5–40)

## 2021-10-08 LAB — HEPATITIS C ANTIBODY: Hep C Virus Ab: NONREACTIVE

## 2021-10-08 LAB — TSH+FREE T4
Free T4: 1.7 ng/dL (ref 0.82–1.77)
TSH: 0.037 u[IU]/mL — ABNORMAL LOW (ref 0.450–4.500)

## 2021-10-09 ENCOUNTER — Emergency Department: Payer: Medicare HMO

## 2021-10-09 ENCOUNTER — Ambulatory Visit: Payer: Self-pay

## 2021-10-09 ENCOUNTER — Inpatient Hospital Stay
Admission: EM | Admit: 2021-10-09 | Discharge: 2021-10-11 | DRG: 603 | Disposition: A | Discharge status: Home / Self Care | Payer: Medicare HMO | Attending: Internal Medicine | Admitting: Internal Medicine

## 2021-10-09 ENCOUNTER — Encounter: Payer: Self-pay | Admitting: *Deleted

## 2021-10-09 ENCOUNTER — Other Ambulatory Visit: Payer: Self-pay

## 2021-10-09 DIAGNOSIS — J45909 Unspecified asthma, uncomplicated: Secondary | ICD-10-CM | POA: Diagnosis present

## 2021-10-09 DIAGNOSIS — Z79811 Long term (current) use of aromatase inhibitors: Secondary | ICD-10-CM | POA: Diagnosis not present

## 2021-10-09 DIAGNOSIS — Z880 Allergy status to penicillin: Secondary | ICD-10-CM | POA: Diagnosis not present

## 2021-10-09 DIAGNOSIS — M7989 Other specified soft tissue disorders: Secondary | ICD-10-CM | POA: Diagnosis not present

## 2021-10-09 DIAGNOSIS — M5416 Radiculopathy, lumbar region: Secondary | ICD-10-CM

## 2021-10-09 DIAGNOSIS — I10 Essential (primary) hypertension: Secondary | ICD-10-CM | POA: Diagnosis not present

## 2021-10-09 DIAGNOSIS — Z981 Arthrodesis status: Secondary | ICD-10-CM

## 2021-10-09 DIAGNOSIS — I129 Hypertensive chronic kidney disease with stage 1 through stage 4 chronic kidney disease, or unspecified chronic kidney disease: Secondary | ICD-10-CM | POA: Diagnosis present

## 2021-10-09 DIAGNOSIS — Z923 Personal history of irradiation: Secondary | ICD-10-CM | POA: Diagnosis not present

## 2021-10-09 DIAGNOSIS — E785 Hyperlipidemia, unspecified: Secondary | ICD-10-CM | POA: Diagnosis present

## 2021-10-09 DIAGNOSIS — Z8249 Family history of ischemic heart disease and other diseases of the circulatory system: Secondary | ICD-10-CM

## 2021-10-09 DIAGNOSIS — Z886 Allergy status to analgesic agent status: Secondary | ICD-10-CM

## 2021-10-09 DIAGNOSIS — E669 Obesity, unspecified: Secondary | ICD-10-CM | POA: Diagnosis not present

## 2021-10-09 DIAGNOSIS — L03116 Cellulitis of left lower limb: Secondary | ICD-10-CM | POA: Diagnosis not present

## 2021-10-09 DIAGNOSIS — L539 Erythematous condition, unspecified: Secondary | ICD-10-CM | POA: Diagnosis not present

## 2021-10-09 DIAGNOSIS — E039 Hypothyroidism, unspecified: Secondary | ICD-10-CM | POA: Diagnosis not present

## 2021-10-09 DIAGNOSIS — N1832 Chronic kidney disease, stage 3b: Secondary | ICD-10-CM | POA: Diagnosis present

## 2021-10-09 DIAGNOSIS — Z808 Family history of malignant neoplasm of other organs or systems: Secondary | ICD-10-CM

## 2021-10-09 DIAGNOSIS — Z79890 Hormone replacement therapy (postmenopausal): Secondary | ICD-10-CM | POA: Diagnosis not present

## 2021-10-09 DIAGNOSIS — Z888 Allergy status to other drugs, medicaments and biological substances status: Secondary | ICD-10-CM | POA: Diagnosis not present

## 2021-10-09 DIAGNOSIS — Z79899 Other long term (current) drug therapy: Secondary | ICD-10-CM | POA: Diagnosis not present

## 2021-10-09 DIAGNOSIS — Z85850 Personal history of malignant neoplasm of thyroid: Secondary | ICD-10-CM

## 2021-10-09 DIAGNOSIS — Z881 Allergy status to other antibiotic agents status: Secondary | ICD-10-CM | POA: Diagnosis not present

## 2021-10-09 DIAGNOSIS — Z803 Family history of malignant neoplasm of breast: Secondary | ICD-10-CM

## 2021-10-09 DIAGNOSIS — K219 Gastro-esophageal reflux disease without esophagitis: Secondary | ICD-10-CM | POA: Diagnosis not present

## 2021-10-09 DIAGNOSIS — M810 Age-related osteoporosis without current pathological fracture: Secondary | ICD-10-CM | POA: Diagnosis present

## 2021-10-09 DIAGNOSIS — Z853 Personal history of malignant neoplasm of breast: Secondary | ICD-10-CM | POA: Diagnosis not present

## 2021-10-09 DIAGNOSIS — Z6830 Body mass index (BMI) 30.0-30.9, adult: Secondary | ICD-10-CM

## 2021-10-09 DIAGNOSIS — R600 Localized edema: Secondary | ICD-10-CM | POA: Diagnosis not present

## 2021-10-09 LAB — CBC
HCT: 39.1 % (ref 36.0–46.0)
Hemoglobin: 12.2 g/dL (ref 12.0–15.0)
MCH: 28.5 pg (ref 26.0–34.0)
MCHC: 31.2 g/dL (ref 30.0–36.0)
MCV: 91.4 fL (ref 80.0–100.0)
Platelets: 155 10*3/uL (ref 150–400)
RBC: 4.28 MIL/uL (ref 3.87–5.11)
RDW: 13.7 % (ref 11.5–15.5)
WBC: 12.8 10*3/uL — ABNORMAL HIGH (ref 4.0–10.5)
nRBC: 0 % (ref 0.0–0.2)

## 2021-10-09 LAB — BASIC METABOLIC PANEL
Anion gap: 8 (ref 5–15)
BUN: 31 mg/dL — ABNORMAL HIGH (ref 8–23)
CO2: 24 mmol/L (ref 22–32)
Calcium: 9.2 mg/dL (ref 8.9–10.3)
Chloride: 106 mmol/L (ref 98–111)
Creatinine, Ser: 1.38 mg/dL — ABNORMAL HIGH (ref 0.44–1.00)
GFR, Estimated: 39 mL/min — ABNORMAL LOW (ref >60)
Glucose, Bld: 116 mg/dL — ABNORMAL HIGH (ref 70–99)
Potassium: 3.7 mmol/L (ref 3.5–5.1)
Sodium: 138 mmol/L (ref 135–145)

## 2021-10-09 LAB — LACTIC ACID, PLASMA
Lactic Acid, Venous: 1.4 mmol/L (ref 0.5–1.9)
Lactic Acid, Venous: 1.1 mmol/L (ref 0.5–1.9)

## 2021-10-09 LAB — BRAIN NATRIURETIC PEPTIDE: B Natriuretic Peptide: 204.4 pg/mL — ABNORMAL HIGH (ref 0.0–100.0)

## 2021-10-09 LAB — TROPONIN I (HIGH SENSITIVITY)
Troponin I (High Sensitivity): 6 ng/L (ref <18)
Troponin I (High Sensitivity): 7 ng/L (ref <18)

## 2021-10-09 MED ORDER — LORATADINE 10 MG PO TABS
10.0000 mg | ORAL_TABLET | Freq: Every day | ORAL | Status: DC
  Administered 2021-10-10 – 2021-10-11 (×2): 10 mg via ORAL
  Filled 2021-10-09 (×2): qty 1

## 2021-10-09 MED ORDER — LEVOTHYROXINE SODIUM 100 MCG PO TABS
150.0000 ug | ORAL_TABLET | Freq: Every day | ORAL | Status: DC
Start: 2021-10-10 — End: 2021-10-11
  Administered 2021-10-10 – 2021-10-11 (×2): 150 ug via ORAL
  Filled 2021-10-09 (×2): qty 1

## 2021-10-09 MED ORDER — VANCOMYCIN HCL 750 MG/150ML IV SOLN
750.0000 mg | INTRAVENOUS | Status: DC
  Administered 2021-10-09 – 2021-10-10 (×2): 750 mg via INTRAVENOUS
  Filled 2021-10-09 (×4): qty 150

## 2021-10-09 MED ORDER — ANASTROZOLE 1 MG PO TABS
1.0000 mg | ORAL_TABLET | Freq: Every day | ORAL | Status: DC
  Administered 2021-10-10 – 2021-10-11 (×2): 1 mg via ORAL
  Filled 2021-10-09 (×2): qty 1

## 2021-10-09 MED ORDER — SODIUM CHLORIDE 0.9 % IV BOLUS
500.0000 mL | Freq: Once | INTRAVENOUS | Status: AC
  Administered 2021-10-09: 500 mL via INTRAVENOUS

## 2021-10-09 MED ORDER — ATORVASTATIN CALCIUM 20 MG PO TABS
40.0000 mg | ORAL_TABLET | Freq: Every day | ORAL | Status: DC
  Administered 2021-10-10: 40 mg via ORAL
  Filled 2021-10-09: qty 2

## 2021-10-09 MED ORDER — GABAPENTIN 300 MG PO CAPS
300.0000 mg | ORAL_CAPSULE | Freq: Every day | ORAL | Status: DC
  Administered 2021-10-10: 300 mg via ORAL
  Filled 2021-10-09: qty 1

## 2021-10-09 MED ORDER — ONDANSETRON HCL 4 MG PO TABS: 4.0000 mg | ORAL_TABLET | Freq: Four times a day (QID) | ORAL | Status: DC | PRN

## 2021-10-09 MED ORDER — ACETAMINOPHEN 325 MG PO TABS
650.0000 mg | ORAL_TABLET | Freq: Four times a day (QID) | ORAL | Status: DC | PRN
  Administered 2021-10-09 – 2021-10-11 (×3): 650 mg via ORAL
  Filled 2021-10-09 (×3): qty 2

## 2021-10-09 MED ORDER — ADULT MULTIVITAMIN W/MINERALS CH
1.0000 | ORAL_TABLET | Freq: Every day | ORAL | Status: DC
  Administered 2021-10-10 – 2021-10-11 (×2): 1 via ORAL
  Filled 2021-10-09 (×3): qty 1

## 2021-10-09 MED ORDER — MAGNESIUM HYDROXIDE 400 MG/5ML PO SUSP: 30.0000 mL | Freq: Every day | ORAL | Status: DC | PRN

## 2021-10-09 MED ORDER — FLUTICASONE PROPIONATE 50 MCG/ACT NA SUSP
2.0000 | Freq: Every day | NASAL | Status: DC | PRN
Start: 2021-10-09 — End: 2021-10-11

## 2021-10-09 MED ORDER — SODIUM CHLORIDE 0.9 % IV SOLN: INTRAVENOUS | Status: DC

## 2021-10-09 MED ORDER — TRAZODONE HCL 50 MG PO TABS
25.0000 mg | ORAL_TABLET | Freq: Every evening | ORAL | Status: DC | PRN
  Administered 2021-10-09 – 2021-10-10 (×2): 25 mg via ORAL
  Filled 2021-10-09 (×2): qty 1

## 2021-10-09 MED ORDER — PANTOPRAZOLE SODIUM 40 MG PO TBEC
40.0000 mg | DELAYED_RELEASE_TABLET | Freq: Every day | ORAL | Status: DC
  Administered 2021-10-10 – 2021-10-11 (×2): 40 mg via ORAL
  Filled 2021-10-09 (×2): qty 1

## 2021-10-09 MED ORDER — VANCOMYCIN HCL IN DEXTROSE 1-5 GM/200ML-% IV SOLN
1000.0000 mg | Freq: Once | INTRAVENOUS | Status: AC
Start: 2021-10-09 — End: 2021-10-09
  Administered 2021-10-09: 1000 mg via INTRAVENOUS

## 2021-10-09 MED ORDER — VITAMIN E 45 MG (100 UNIT) PO CAPS
400.0000 [IU] | ORAL_CAPSULE | Freq: Every day | ORAL | Status: DC
  Administered 2021-10-10 – 2021-10-11 (×2): 400 [IU] via ORAL
  Filled 2021-10-09 (×2): qty 4

## 2021-10-09 MED ORDER — AMLODIPINE BESYLATE 5 MG PO TABS
2.5000 mg | ORAL_TABLET | Freq: Every day | ORAL | Status: DC
  Administered 2021-10-10 – 2021-10-11 (×2): 2.5 mg via ORAL
  Filled 2021-10-09 (×2): qty 1

## 2021-10-09 MED ORDER — ONDANSETRON HCL 4 MG/2ML IJ SOLN: 4.0000 mg | Freq: Four times a day (QID) | INTRAMUSCULAR | Status: DC | PRN

## 2021-10-09 MED ORDER — OYSTER SHELL CALCIUM/D3 500-5 MG-MCG PO TABS
1.0000 | ORAL_TABLET | Freq: Every day | ORAL | Status: DC
  Administered 2021-10-10 – 2021-10-11 (×2): 1 via ORAL
  Filled 2021-10-09 (×2): qty 1

## 2021-10-09 MED ORDER — TOBRAMYCIN-DEXAMETHASONE 0.3-0.1 % OP SUSP
2.0000 [drp] | Freq: Four times a day (QID) | OPHTHALMIC | Status: DC
Start: 2021-10-09 — End: 2021-10-11
  Administered 2021-10-10 – 2021-10-11 (×8): 2 [drp] via OPHTHALMIC
  Filled 2021-10-09: qty 2.5

## 2021-10-09 MED ORDER — CHLORTHALIDONE 25 MG PO TABS
25.0000 mg | ORAL_TABLET | Freq: Two times a day (BID) | ORAL | Status: DC
  Administered 2021-10-10 – 2021-10-11 (×3): 25 mg via ORAL
  Filled 2021-10-09 (×4): qty 1

## 2021-10-09 MED ORDER — VITAMIN D 25 MCG (1000 UNIT) PO TABS
1000.0000 [IU] | ORAL_TABLET | Freq: Every day | ORAL | Status: DC
  Administered 2021-10-10 – 2021-10-11 (×2): 1000 [IU] via ORAL
  Filled 2021-10-09 (×2): qty 1

## 2021-10-09 MED ORDER — IRBESARTAN 150 MG PO TABS
300.0000 mg | ORAL_TABLET | Freq: Every day | ORAL | Status: DC
  Administered 2021-10-10 – 2021-10-11 (×2): 300 mg via ORAL
  Filled 2021-10-09 (×2): qty 2

## 2021-10-09 MED ORDER — ACETAMINOPHEN 650 MG RE SUPP: 650.0000 mg | Freq: Four times a day (QID) | RECTAL | Status: DC | PRN

## 2021-10-09 MED ORDER — VANCOMYCIN HCL IN DEXTROSE 1-5 GM/200ML-% IV SOLN: 1000.0000 mg | Freq: Once | INTRAVENOUS | Status: DC

## 2021-10-09 MED ORDER — GLUCOSAMINE-CHONDROITIN PO CAPS: ORAL_CAPSULE | Freq: Every day | ORAL | Status: DC

## 2021-10-09 MED ORDER — ENOXAPARIN SODIUM 40 MG/0.4ML IJ SOSY
40.0000 mg | PREFILLED_SYRINGE | INTRAMUSCULAR | Status: DC
  Administered 2021-10-09 – 2021-10-10 (×2): 40 mg via SUBCUTANEOUS
  Filled 2021-10-09 (×2): qty 0.4

## 2021-10-09 MED ORDER — POTASSIUM CHLORIDE 20 MEQ PO PACK
40.0000 meq | PACK | Freq: Once | ORAL | Status: AC
  Administered 2021-10-09: 40 meq via ORAL
  Filled 2021-10-09: qty 2

## 2021-10-09 NOTE — Assessment & Plan Note (Addendum)
-   We will continue her amlodipine and hold off her chlorthalidone and Avapro. - We will hydrate with IV normal saline and follow BMP in the setting of stage IIIb chronic kidney disease. - We will place on as needed IV labetalol.

## 2021-10-09 NOTE — Assessment & Plan Note (Signed)
-   We will continue PPI therapy 

## 2021-10-09 NOTE — H&P (Signed)
[place]   PATIENT NAME: Neomi Laidler    MR#:  [id number]  DATE OF BIRTH:  06-24-1940  DATE OF ADMISSION:  10/09/2021  PRIMARY CARE PHYSICIAN: Glean Hess, MD   Patient is coming from: Home  REQUESTING/REFERRING PHYSICIAN: Cuthriell, Leola Brazil  CHIEF COMPLAINT:   Chief Complaint  Patient presents with   Leg Pain    HISTORY OF PRESENT ILLNESS:  LEITA LINDBLOOM is a 81 y.o. Caucasian female with medical history significant for anxiety, osteoarthritis, asthma, breast cancer, GERD, on Arimidex, stage IIIb chronic kidney disease, DDD, hypertension comes lipidemia, RLS and hypothyroidism, who presented to the emergency room with acute onset of worsening left lower extremity pain with associated swelling and erythema since last week worsening over the last couple of days.  She was seen by her PCP at which time it was only edema without erythema.  It started around her ankle and extended to her calf area.  Her pain is extending from below her knee down to her ankle.  2 years ago she had a significant injury when she was mowing her lawn and picked up a piece of wire that became impaled in her leg.  She was taken to the OR for removal of the wire foreign body as well as irrigation but had no fractures.  She admits to mild chills with no reported fever.  Chest pain or dyspnea or cough.  No nausea or vomiting or abdominal pain.  She denies any history of thromboembolism..  No dysuria, oliguria or hematuria or flank pain.  No headache or dizziness or blurred vision.  No new lotions, or detergents or new medications.  ED Course: When she came to the ER, vital signs were within normal.  Labs revealed BMP with BUN of 31 and creatinine of 4.38 compared to 20/1.26 couple of days ago.  BNP was 204.4 and high-sensitivity troponin I was 6 and later 7.  Lactic acid was 1.4 and later 1.1 and CBC showed leukocytosis of 12.8. EKG as reviewed by me : EKG showed normal sinus rhythm rate of 85  with left axis deviation and T wave inversion in V1. Imaging: Venous Doppler of the left lower extremity revealed no evidence for DVT.  It showed 8.4 X2.3X 4.6 fluid density lesion within the left vaginal fossa that could represent Baker's cyst versus liquefied hematoma.                 The patient was given a gram of IV vancomycin and 500 mill IV normal saline.  She will be admitted to a medical bed for further evaluation and management.  PAST MEDICAL HISTORY:   Past Medical History:  Diagnosis Date   Allergic rhinitis    Anemia    low iron   Anxiety    Arthritis 07/2019   osteoporosis   Asthma without status asthmaticus    seasonal, not often   Bilateral sciatica    Breast cancer ([facility]) 07/2019   invasive mammary cancer , grade 2   Bronchitis    Cancer ([facility])     follicular variant papillary thyroid cancer   Chronic back pain    unspecified   Chronic kidney disease    ckd stage 3b   Colon polyp    adenomatous   DDD (degenerative disc disease), lumbar    DJD (degenerative joint disease)    Dysphagia    Dyspnea    E coli bacteremia    around 10/11   Environmental  allergies    Family history of breast cancer    Family history of colon cancer    Family history of ovarian cancer    Family history of pancreatic cancer    Family history of prostate cancer    Family history of thyroid cancer    GERD (gastroesophageal reflux disease)    Headache    migraines as a young woman   History of recurrent UTIs    Hyperlipidemia    Hypertension    Hypothyroidism    IBS (irritable bowel syndrome)    Lumbar compression fracture ([facility]) 12/02/2016   Lumbar disc disease    Mold exposure 2002   hx of    Osteoporosis, post-menopausal    Personal history of radiation therapy    Pneumonia 1978   Restless legs    Scoliosis    Spinal stenosis    Thyroid nodule    aspirated in 1997 and 2003 which were negative   Ulcer    Varicose veins of left lower extremity    Vertigo    Wears  dentures     PAST SURGICAL HISTORY:   Past Surgical History:  Procedure Laterality Date   ABDOMINAL HYSTERECTOMY  1977   Dr. Tito Dine FRACTURE SURGERY Left 2003   arch of foot.  no metal. per patient, she did not have surgery for this fracture. healed on its own   ANTERIOR LAT LUMBAR FUSION Left 09/09/2016   Procedure: Left Lumbar two-three Anterolateral lumbar interbody fusion with lateral plate;  Surgeon: Erline Levine, MD;  Location: [facility];  Service: Neurosurgery;  Laterality: Left;  Left L2-3 Anterolateral lumbar interbody fusion with lateral plate   APPENDECTOMY     BACK SURGERY  2013, 2016, 2018   lumbar lam, fusion, discectomy.titanium   BIOPSY THYROID     x2  thyroid nodule ; Dr. Sharlet Salina and one by Dr. Sabra Heck   BREAST BIOPSY Left 08/02/2019   coil clip-INVASIVE MAMMARY CARCINOMA   BREAST CYST ASPIRATION  1970's   BREAST LUMPECTOMY Left 2021   BREAST LUMPECTOMY WITH RADIOFREQUENCY TAG IDENTIFICATION Left 9/92/4268   Procedure: BREAST LUMPECTOMY WITH RADIOFREQUENCY TAG IDENTIFICATION;  Surgeon: Ronny Bacon, MD;  Location: [facility];  Service: General;  Laterality: Left;   BREAST LUMPECTOMY WITH SENTINEL LYMPH NODE BIOPSY Left 08/12/2019   Procedure: BREAST LUMPECTOMY WITH SENTINEL LYMPH NODE BX;  Surgeon: Ronny Bacon, MD;  Location: [facility];  Service: General;  Laterality: Left;   broken arm Left 1994   wrist fracture surgery, left.  metal has been replaced.   CARDIOVASCULAR STRESS TEST     2010   COLONOSCOPY  2010   COLONOSCOPY  07/21/2013   PHX OF CP/REPEAT 42YRS/[place]   ESOPHAGOGASTRODUODENOSCOPY (EGD) WITH PROPOFOL N/A 09/22/2018   Procedure: ESOPHAGOGASTRODUODENOSCOPY (EGD) WITH PROPOFOL;  Surgeon: Toledo, Benay Pike, MD;  Location: [facility];  Service: Gastroenterology;  Laterality: N/A;  Patient to have Rapid COVID-19 day of procedure Kieth Brightly approved this)   EYE SURGERY Bilateral    cataract surgery with lens implant   FOREIGN BODY REMOVAL N/A 12/09/2019    Procedure: FOREIGN BODY REMOVAL ADULT;  Surgeon: Hessie Knows, MD;  Location: [facility];  Service: Orthopedics;  Laterality: N/A;   INCISION AND DRAINAGE Left 12/09/2019   Procedure: INCISION AND DRAINAGE;  Surgeon: Hessie Knows, MD;  Location: [facility];  Service: Orthopedics;  Laterality: Left;   LACERATION REPAIR Bilateral 12/09/2019   Procedure: REPAIR MULTIPLE LACERATIONS;  Surgeon: Hessie Knows, MD;  Location: [facility];  Service:  Orthopedics;  Laterality: Bilateral;   LUMBAR LAMINECTOMY/DECOMPRESSION MICRODISCECTOMY  11/18/2011   Procedure: LUMBAR LAMINECTOMY/DECOMPRESSION MICRODISCECTOMY 2 LEVELS;  Surgeon: Erline Levine, MD;  Location: [facility];  Service: Neurosurgery;  Laterality: N/A;  Lumbar Three-Four,  Lumbar Four-Five Decompressive Laminectomy   MALONEY DILATION N/A 09/22/2018   Procedure: MALONEY DILATION;  Surgeon: Toledo, Benay Pike, MD;  Location: [facility];  Service: Gastroenterology;  Laterality: N/A;   OVARIAN CYST SURGERY     POSTERIOR FUSION LUMBAR SPINE  2016   TOTAL THYROIDECTOMY     TRANSTHORACIC ECHOCARDIOGRAM  2010   upper and lower GI  2010    SOCIAL HISTORY:   Social History   Tobacco Use   Smoking status: Never   Smokeless tobacco: Never  Substance Use Topics   Alcohol use: Not Currently    Comment: socially-rare    FAMILY HISTORY:   Family History  Problem Relation Age of Onset   Breast cancer Mother 8   Thyroid cancer Mother    Pancreatic cancer Brother    Pancreatic cancer Sister    Suicidality Father    Prostate cancer Brother    Colon cancer Brother    Breast cancer Sister        x 2 times   Heart attack Sister    Ovarian cancer Maternal Aunt    Breast cancer Paternal Aunt    Lung cancer Paternal Uncle    Breast cancer Other    Colon cancer Maternal Aunt    Pancreatic cancer Maternal Aunt     DRUG ALLERGIES:   Allergies  Allergen Reactions   Flagyl [Metronidazole Hcl] Swelling    Swelling in face, mouth, throat, palms of  feet and hands.   Furosemide Itching    Made side of mouth irritated and puffy.  The rim inside her lip swelled up   Macrodantin [Nitrofurantoin Macrocrystal] Anaphylaxis, Hives and Swelling   Penicillins Anaphylaxis and Swelling    PATIENT HAD A PCN REACTION WITH IMMEDIATE RASH, FACIAL/TONGUE/THROAT SWELLING, SOB, OR LIGHTHEADEDNESS WITH HYPOTENSION:  #  #  #  YES  #  #  #   Has patient had a PCN reaction causing severe rash involving mucus membranes or skin necrosis: No Has patient had a PCN reaction that required hospitalization: No Has patient had a PCN reaction occurring within the last 10 years: No If all of the above answers are "NO", then may proceed with Cephalosporin use.    Celebrex [Celecoxib] Swelling    Swelling in joints.  Did not agree with her. Did not help her either   Other     Pt says it was recently discovered she allergic to an ingredient in local anesthesia but she can't recall the name. (epinephrine and the additive used for the biopsy. Gets very nervous. Feels internally shaking. 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 (2018 and 2021)    REVIEW OF SYSTEMS:   ROS As per history of present illness. All pertinent systems were reviewed above. Constitutional, HEENT, cardiovascular, respiratory, GI, GU, musculoskeletal, neuro, psychiatric, endocrine, integumentary and hematologic systems were reviewed and are otherwise negative/unremarkable except for positive findings mentioned above in the HPI.   MEDICATIONS AT HOME:   Prior to Admission medications   Medication Sig Start Date End Date Taking? Authorizing Provider  acetaminophen (TYLENOL) 650 MG CR tablet Take by mouth.    [provider]  amLODipine (NORVASC) 2.5 MG tablet Take 2.5 mg by mouth daily. 08/07/21   [provider]  anastrozole (ARIMIDEX) 1 MG tablet TAKE 1 TABLET EVERY DAY 08/02/21   Sindy Guadeloupe, MD  atorvastatin (LIPITOR) 40 MG tablet Take 1 tablet (40 mg total) by  mouth at bedtime. 09/03/21   Glean Hess, MD  Calcium Carbonate-Vitamin D 600-400 MG-UNIT tablet Take 2 tablets by mouth daily.    [provider]  chlorthalidone (HYGROTON) 25 MG tablet Take 25 mg by mouth in the morning and at bedtime.  09/24/17   [provider]  cholecalciferol (VITAMIN D3) 25 MCG (1000 UNIT) tablet Take 1,000 Units by mouth daily.    [provider]  denosumab (PROLIA) 60 MG/ML SOSY injection Inject 60 mg into the skin every 6 (six) months.    [provider]  fexofenadine (ALLEGRA) 180 MG tablet Take 180 mg by mouth daily.    [provider]  fluticasone (FLONASE) 50 MCG/ACT nasal spray Place 2 sprays into both nostrils daily as needed for allergies.    [provider]  gabapentin (NEURONTIN) 300 MG capsule Take 300 mg by mouth at bedtime. 07/05/20   [provider]  GLUCOSAMINE-CHONDROITIN PO Take 1 tablet by mouth daily.     [provider]  irbesartan (AVAPRO) 300 MG tablet Take 1 tablet (300 mg total) by mouth daily. 08/02/21   Glean Hess, MD  levothyroxine (SYNTHROID) 150 MCG tablet Take 1 tablet (150 mcg total) by mouth daily before breakfast. 10/07/21   Glean Hess, MD  Multiple Vitamin (MULTIVITAMIN WITH MINERALS) TABS Take 1 tablet by mouth daily.    [provider]  omeprazole (PRILOSEC) 40 MG capsule Take 40 mg by mouth daily before breakfast.    [provider]  tobramycin-dexamethasone (TOBRADEX) ophthalmic solution Place 2 drops into both eyes every 6 (six) hours. 10/07/21   Glean Hess, MD  vitamin E 180 MG (400 UNITS) capsule Take 400 Units by mouth daily.    [provider]      VITAL SIGNS:  Blood pressure 131/80, pulse 89, temperature 98.7 F (37.1 C), temperature source Oral, resp. rate 17, height '5\' 3"'$  (1.6 m), weight 77.6 kg, SpO2 98 %.  PHYSICAL EXAMINATION:  Physical Exam  GENERAL:  81 y.o.-year-old patient lying in the bed with  no acute distress.  EYES: Pupils equal, round, reactive to light and accommodation. No scleral icterus. Extraocular muscles intact.  HEENT: Head atraumatic, normocephalic. Oropharynx and nasopharynx clear.  NECK:  Supple, no jugular venous distention. No thyroid enlargement, no tenderness.  LUNGS: Normal breath sounds bilaterally, no wheezing, rales,rhonchi or crepitation. No use of accessory muscles of respiration.  CARDIOVASCULAR: Regular rate and rhythm, S1, S2 normal. No murmurs, rubs, or gallops.  ABDOMEN: Soft, nondistended, nontender. Bowel sounds present. No organomegaly or mass.  EXTREMITIES: Bilateral 1+ pitting lower extremity edema more on the left than the right with no cyanosis, or clubbing.  She has generalized left leg extending from below her knee to her ankle with erythema, induration, warmth and tenderness without fluctuation. NEUROLOGIC: Cranial nerves II through XII are intact. Muscle strength 5/5 in all extremities. Sensation intact. Gait not checked.  PSYCHIATRIC: The patient is alert and oriented x 3.  Normal affect and good eye contact. SKIN: She has generalized left leg extending from below her knee to her ankle with erythema, induration, warmth and tenderness without fluctuation. No other obvious rash, lesion, or ulcer.     LABORATORY PANEL:   CBC Recent Labs  Lab 10/09/21 1550  WBC 12.8*  HGB 12.2  HCT  39.1  PLT 155   ------------------------------------------------------------------------------------------------------------------  Chemistries  Recent Labs  Lab 10/07/21 1208 10/09/21 1550  NA 144 138  K 5.0 3.7  CL 107* 106  CO2 22 24  GLUCOSE 94 116*  BUN 28* 31*  CREATININE 1.26* 1.38*  CALCIUM 10.0 9.2  AST 17  --   ALT 16  --   ALKPHOS 76  --   BILITOT 0.7  --    ------------------------------------------------------------------------------------------------------------------  Cardiac Enzymes No results for input(s): "TROPONINI" in the  last 168 hours. ------------------------------------------------------------------------------------------------------------------  RADIOLOGY:  US Venous Img Lower Unilateral Left  Result Date: 10/09/2021 CLINICAL DATA:  pain, edema, erythema to LLE EXAM: Left LOWER EXTREMITY VENOUS DOPPLER ULTRASOUND TECHNIQUE: Gray-scale sonography with compression, as well as color and duplex ultrasound, were performed to evaluate the deep venous system(s) from the level of the common femoral vein through the popliteal and proximal calf veins. COMPARISON:  None Available. FINDINGS: VENOUS Normal compressibility of the common femoral, superficial femoral, and popliteal veins, as well as the visualized calf veins. Please note limited evaluation of the calf veins. Visualized portions of profunda femoral vein and great saphenous vein unremarkable. No filling defects to suggest DVT on grayscale or color Doppler imaging. Doppler waveforms show normal direction of venous flow, normal respiratory plasticity and response to augmentation. Limited views of the contralateral common femoral vein are unremarkable. OTHER Subcutaneus soft tissue edema of the calves. There is a 8.4 x 2.3 x 4.6 cm fluid density lesion within the left popliteal fossa. Limitations: Edema. IMPRESSION: 1. No deep venous thrombosis left lower extremity with limited evaluation of the calf veins. 2. A 8.4 x 2.3 x 4.6 cm fluid density lesion within the left popliteal fossa. Finding could represent a Baker cyst versus liquified hematoma. Electronically Signed   By: Iven Finn M.D.   On: 10/09/2021 18:08   DG Tibia/Fibula Left  Result Date: 10/09/2021 CLINICAL DATA:  Erythema to left lower extremity.  Pain and edema. EXAM: LEFT TIBIA AND FIBULA - 2 VIEW COMPARISON:  Left tibia and fibula radiographs 12/09/2019 FINDINGS: Interval removal of the prior linear metallic foreign body previously seen within the distal medial calf. There is diffuse decreased bone  mineralization. Moderate lateral and mild-to-moderate medial compartment joint space narrowing with moderate degenerative osteophytosis in both compartments. Severe patellofemoral joint space narrowing. Mild patella alta. No joint effusion. No acute fracture or dislocation. Small plantar calcaneal heel spur. Moderate midfoot joint space narrowing and subchondral osteoarthritis. Diffuse subcutaneous fat edema appears similar to prior 12/09/2019 radiographs and may be systemic. IMPRESSION: 1. No acute fracture. Diffuse subcutaneous fat edema appears similar to prior 12/09/2019 radiographs and may be systemic. 2. Moderate lateral greater than medial knee compartment osteoarthritis. 3. Severe patellofemoral osteoarthritis. Electronically Signed   By: Yvonne Kendall M.D.   On: 10/09/2021 17:12   DG Chest 2 View  Result Date: 10/09/2021 CLINICAL DATA:  Lower extremity swelling. EXAM: CHEST - 2 VIEW COMPARISON:  September 09, 2016. FINDINGS: The heart size and mediastinal contours are within normal limits. Both lungs are clear. The visualized skeletal structures are unremarkable. IMPRESSION: No active cardiopulmonary disease. Electronically Signed   By: Marijo Conception M.D.   On: 10/09/2021 16:24      IMPRESSION AND PLAN:  Assessment and Plan: * Left leg cellulitis - The patient will be admitted to a medical bed. - We will continue antibiotic therapy with IV vancomycin. - Warm compresses will be utilized. - Pain management will be provided.  Lumbar radiculopathy -  We will continue Neurontin.  History of breast cancer - We will continue Arimidex  Essential hypertension - We will continue her amlodipine and hold off her chlorthalidone and Avapro. - We will hydrate with IV normal saline and follow BMP in the setting of stage IIIb chronic kidney disease. - We will place on as needed IV labetalol.  Dyslipidemia - We will continue statin therapy.  GERD without esophagitis - We will continue PPI  therapy.  Acquired hypothyroidism - We will continue Synthroid..   DVT prophylaxis: Lovenox.  Advanced Care Planning:  Code Status: full code.  Family Communication:  The plan of care was discussed in details with the patient (and family). I answered all questions. The patient agreed to proceed with the above mentioned plan. Further management will depend upon hospital course. Disposition Plan: Back to previous home environment Consults called: none.  All the records are reviewed and case discussed with ED provider.  Status is: Inpatient  At the time of the admission, it appears that the appropriate admission status for this patient is inpatient.  This is judged to be reasonable and necessary in order to provide the required intensity of service to ensure the patient's safety given the presenting symptoms, physical exam findings and initial radiographic and laboratory data in the context of comorbid conditions.  The patient requires inpatient status due to high intensity of service, high risk of further deterioration and high frequency of surveillance required.  I certify that at the time of admission, it is my clinical judgment that the patient will require inpatient hospital care extending more than 2 midnights.                            Dispo: The patient is from: Home              Anticipated d/c is to: Home              Patient currently is not medically stable to d/c.              Difficult to place patient: No  Christel Mormon M.D on 10/09/2021 at 7:54 PM  [facility]   From 7 PM-7 AM, contact night-coverage [email or web address]  CC: Primary care physician; Glean Hess, MD

## 2021-10-09 NOTE — Assessment & Plan Note (Signed)
-   We will continue Synthroid. 

## 2021-10-09 NOTE — Telephone Encounter (Signed)
  Chief Complaint: left lower leg swelling Symptoms: redness, hot to touch, swelling and leaking fluid from area Frequency: noted last night Pertinent Negatives: Patient denies fever Disposition: '[]'$ ED /'[]'$ Urgent Care (no appt availability in office) / '[]'$ Appointment(In office/virtual)/ '[]'$  [facility]/ '[]'$ Home Care/ '[]'$ Refused Recommended Disposition /'[]'$ [facility]/ '[x]'$  Follow-up with PCP Additional Notes: called office and was advised to send note to office  Reason for Disposition  [1] MODERATE leg swelling (e.g., swelling extends up to knees) AND [2] new-onset or worsening  Answer Assessment - Initial Assessment Questions 1. ONSET: "When did the swelling start?" (e.g., minutes, hours, days)     Years comes and goes 2. LOCATION: "What part of the leg is swollen?"  "Are both legs swollen or just one leg?"     Left leg  swollen 3. SEVERITY: "How bad is the swelling?" (e.g., localized; mild, moderate, severe)   - Localized: Small area of swelling localized to one leg.   - MILD pedal edema: Swelling limited to foot and ankle, pitting edema < 1/4 inch (6 mm) deep, rest and elevation eliminate most or all swelling.   - MODERATE edema: Swelling of lower leg to knee, pitting edema > 1/4 inch (6 mm) deep, rest and elevation only partially reduce swelling.   - SEVERE edema: Swelling extends above knee, facial or hand swelling present.      moderate 4. REDNESS: "Does the swelling look red or infected?"    yes 5. PAIN: "Is the swelling painful to touch?" If Yes, ask: "How painful is it?"   (Scale 1-10; mild, moderate or severe)     7/10 6. FEVER: "Do you have a fever?" If Yes, ask: "What is it, how was it measured, and when did it start?"      no 7. CAUSE: "What do you think is causing the leg swelling?"     Cellulitis versus DVT 8. MEDICAL HISTORY: "Do you have a history of blood clots (e.g., DVT), cancer, heart failure, kidney disease, or liver failure?"     no 9. RECURRENT  SYMPTOM: "Have you had leg swelling before?" If Yes, ask: "When was the last time?" "What happened that time?"     4 years ago due to swelling 10. OTHER SYMPTOMS: "Do you have any other symptoms?" (e.g., chest pain, difficulty breathing)       Area of redness and area is hot to touch.  Protocols used: Leg Swelling and Edema-A-AH

## 2021-10-09 NOTE — ED Provider Triage Note (Signed)
Emergency Medicine Provider Triage Evaluation Note  Latoya Stevenson, a 81 y.o. female  was evaluated in triage.  Pt complains of swelling, redness, and weeping to her left lower extremity.  She denies any recent injury, trauma, fall patient does report onset of symptoms yesterday.  Review of Systems  Positive: LLE edema,erythema Negative: SOB  Physical Exam  BP 131/80 (BP Location: Left Arm)   Pulse 89   Temp 98.7 F (37.1 C) (Oral)   Resp 17   Ht '5\' 3"'$  (1.6 m)   Wt 77.6 kg   SpO2 98%   BMI 30.29 kg/m  Gen:   Awake, no distress  NAD Resp:  Normal effort  MSK:   Moves extremities without difficulty LLE with anterior erythema, warmth, and edema Other:    Medical Decision Making  Medically screening exam initiated at 3:50 PM.  Appropriate orders placed.  TIAJUANA LEPPANEN was informed that the remainder of the evaluation will be completed by another provider, this initial triage assessment does not replace that evaluation, and the importance of remaining in the ED until their evaluation is complete.  Geriatric patient to the ED for evaluation of left lower extremity edema, erythema, warmth, and tenderness.  Patient denies any preceding injury, trauma, fall.   Melvenia Needles, PA-C 10/09/21 1552

## 2021-10-09 NOTE — Consult Note (Signed)
Pharmacy Antibiotic Note  Latoya Stevenson is a 81 y.o. female admitted on 10/09/2021 with cellulitis.  Pharmacy has been consulted for vancomycin dosing.  Plan: Pt received vancomycin 1000 mg x 1 in the ED. Will start vancomycin 750 mg q24H. Will give the first 750 mg dose now so a loading dose of 1750 mg can be completed today. Predicted AUC of 495. Goal AUC 400-550. Scr 1.38, Vd 0.72 (BMI borderline), IBW. Plan to order vancomycin levels after 4th or 5th dose.   Height: '5\' 3"'$  (160 cm) Weight: 77.6 kg (171 lb) IBW/kg (Calculated) : 52.4  Temp (24hrs), Avg:98.7 F (37.1 C), Min:98.7 F (37.1 C), Max:98.7 F (37.1 C)  Recent Labs  Lab 10/07/21 1208 10/09/21 1550 10/09/21 1553 10/09/21 1834  WBC 5.4 12.8*  --   --   CREATININE 1.26* 1.38*  --   --   LATICACIDVEN  --   --  1.4 1.1    Estimated Creatinine Clearance: 32.6 mL/min (A) (by C-G formula based on SCr of 1.38 mg/dL (H)).    Allergies  Allergen Reactions   Flagyl [Metronidazole Hcl] Swelling    Swelling in face, mouth, throat, palms of feet and hands.   Furosemide Itching    Made side of mouth irritated and puffy.  The rim inside her lip swelled up   Macrodantin [Nitrofurantoin Macrocrystal] Anaphylaxis, Hives and Swelling   Penicillins Anaphylaxis and Swelling    PATIENT HAD A PCN REACTION WITH IMMEDIATE RASH, FACIAL/TONGUE/THROAT SWELLING, SOB, OR LIGHTHEADEDNESS WITH HYPOTENSION:  #  #  #  YES  #  #  #   Has patient had a PCN reaction causing severe rash involving mucus membranes or skin necrosis: No Has patient had a PCN reaction that required hospitalization: No Has patient had a PCN reaction occurring within the last 10 years: No If all of the above answers are "NO", then may proceed with Cephalosporin use.    Celebrex [Celecoxib] Swelling    Swelling in joints.  Did not agree with her. Did not help her either   Other     Pt says it was recently discovered she allergic to an ingredient in local anesthesia but she  can't recall the name. (epinephrine and the additive used for the biopsy. Gets very nervous. Feels internally shaking. 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   Thank you for allowing pharmacy to be a part of this patient's care.  Oswald Hillock, PharmD, BCPS 10/09/2021 7:45 PM

## 2021-10-09 NOTE — ED Provider Notes (Signed)
[facility] Provider Note  Patient Contact: 4:15 PM (approximate)   History   Leg Pain   HPI  Latoya Stevenson is a 81 y.o. female who presents to the emergency department complaining of left lower extremity pain, erythema, edema.  This is been developing over the last week.  Patient did have an appointment with her primary care, and at that time there was no gross erythema there though there was some edema.  There is no medication started.  Patient has been having worsening pain, worsening edema and erythema to the lower extremity.  This starts around the ankle joint and extends through the mid calf region.  Patient states that the edema has encompassed her knee but is not currently doing so.  She does have pain from her knee down to her ankle joint.  She has a remote history 2 years ago of a significant injury where she was mowing her lawn, picked up a piece of wire that became impaled in her leg.  There was no associated fractures but did require orthopedics to take her to the OR for removal of the wire foreign body as well as irrigation.  She has a residual scar to this area.  No in-place hardware.  There is no reported retained foreign body.  Patient has had some chills but no reported fever at home.  No other symptoms to include chest pain, shortness of breath, nausea, vomiting, urinary symptoms.  Patient with no history of DVT/PE.  Medical history includes hypertension, spinal stenosis, arthritis, GERD, IBS     Physical Exam   Triage Vital Signs: ED Triage Vitals  Enc Vitals Group     BP 10/09/21 1544 131/80     Pulse Rate 10/09/21 1544 89     Resp 10/09/21 1544 17     Temp 10/09/21 1544 98.7 F (37.1 C)     Temp Source 10/09/21 1544 Oral     SpO2 10/09/21 1544 98 %     Weight 10/09/21 1548 171 lb (77.6 kg)     Height 10/09/21 1548 '5\' 3"'$  (1.6 m)     Head Circumference --      Peak Flow --      Pain Score 10/09/21 1548 7     Pain Loc --      Pain Edu? --       Excl. in [place]? --     Most recent vital signs: Vitals:   10/09/21 1544  BP: 131/80  Pulse: 89  Resp: 17  Temp: 98.7 F (37.1 C)  SpO2: 98%     General: Alert and in no acute distress.  Cardiovascular:  Good peripheral perfusion Respiratory: Normal respiratory effort without tachypnea or retractions. Lungs CTAB.  Musculoskeletal: Full range of motion to all extremities.  Visualization of the left lower extremity reveals gross erythema and edema when compared with right.  Patient has baseline lower extremity edema due to habitus but this is definite worsening of the left lower extremity.  Findings on physical exam are concerning for cellulitis.  It is very warm to the touch, globally tender about the lower extremity.  This starts just proximal to the ankle joint and extends through the mid to proximal region of the tibia.  No joint tenderness over the left knee.  Dorsalis pedis pulse and sensation intact distally. Neurologic:  No gross focal neurologic deficits are appreciated.  Skin:   No rash noted Other:   ED Results / Procedures / Treatments  Labs (all labs ordered are listed, but only abnormal results are displayed) Labs Reviewed  BASIC METABOLIC PANEL - Abnormal; Notable for the following components:      Result Value   Glucose, Bld 116 (*)    BUN 31 (*)    Creatinine, Ser 1.38 (*)    GFR, Estimated 39 (*)    All other components within normal limits  CBC - Abnormal; Notable for the following components:   WBC 12.8 (*)    All other components within normal limits  BRAIN NATRIURETIC PEPTIDE - Abnormal; Notable for the following components:   B Natriuretic Peptide 204.4 (*)    All other components within normal limits  LACTIC ACID, PLASMA  LACTIC ACID, PLASMA  TROPONIN I (HIGH SENSITIVITY)  TROPONIN I (HIGH SENSITIVITY)     EKG     RADIOLOGY  I personally viewed, evaluated, and interpreted these images as part of my medical decision making, as well as  reviewing the written report by the radiologist.  ED Provider Interpretation: No evidence of osteomyelitis on x-ray.  No evidence of DVT on ultrasound  US Venous Img Lower Unilateral Left  Result Date: 10/09/2021 CLINICAL DATA:  pain, edema, erythema to LLE EXAM: Left LOWER EXTREMITY VENOUS DOPPLER ULTRASOUND TECHNIQUE: Gray-scale sonography with compression, as well as color and duplex ultrasound, were performed to evaluate the deep venous system(s) from the level of the common femoral vein through the popliteal and proximal calf veins. COMPARISON:  None Available. FINDINGS: VENOUS Normal compressibility of the common femoral, superficial femoral, and popliteal veins, as well as the visualized calf veins. Please note limited evaluation of the calf veins. Visualized portions of profunda femoral vein and great saphenous vein unremarkable. No filling defects to suggest DVT on grayscale or color Doppler imaging. Doppler waveforms show normal direction of venous flow, normal respiratory plasticity and response to augmentation. Limited views of the contralateral common femoral vein are unremarkable. OTHER Subcutaneus soft tissue edema of the calves. There is a 8.4 x 2.3 x 4.6 cm fluid density lesion within the left popliteal fossa. Limitations: Edema. IMPRESSION: 1. No deep venous thrombosis left lower extremity with limited evaluation of the calf veins. 2. A 8.4 x 2.3 x 4.6 cm fluid density lesion within the left popliteal fossa. Finding could represent a Baker cyst versus liquified hematoma. Electronically Signed   By: Iven Finn M.D.   On: 10/09/2021 18:08   DG Tibia/Fibula Left  Result Date: 10/09/2021 CLINICAL DATA:  Erythema to left lower extremity.  Pain and edema. EXAM: LEFT TIBIA AND FIBULA - 2 VIEW COMPARISON:  Left tibia and fibula radiographs 12/09/2019 FINDINGS: Interval removal of the prior linear metallic foreign body previously seen within the distal medial calf. There is diffuse decreased  bone mineralization. Moderate lateral and mild-to-moderate medial compartment joint space narrowing with moderate degenerative osteophytosis in both compartments. Severe patellofemoral joint space narrowing. Mild patella alta. No joint effusion. No acute fracture or dislocation. Small plantar calcaneal heel spur. Moderate midfoot joint space narrowing and subchondral osteoarthritis. Diffuse subcutaneous fat edema appears similar to prior 12/09/2019 radiographs and may be systemic. IMPRESSION: 1. No acute fracture. Diffuse subcutaneous fat edema appears similar to prior 12/09/2019 radiographs and may be systemic. 2. Moderate lateral greater than medial knee compartment osteoarthritis. 3. Severe patellofemoral osteoarthritis. Electronically Signed   By: Yvonne Kendall M.D.   On: 10/09/2021 17:12   DG Chest 2 View  Result Date: 10/09/2021 CLINICAL DATA:  Lower extremity swelling. EXAM: CHEST - 2 VIEW COMPARISON:  September 09, 2016. FINDINGS: The heart size and mediastinal contours are within normal limits. Both lungs are clear. The visualized skeletal structures are unremarkable. IMPRESSION: No active cardiopulmonary disease. Electronically Signed   By: Marijo Conception M.D.   On: 10/09/2021 16:24    PROCEDURES:  Critical Care performed: No  Procedures   MEDICATIONS ORDERED IN ED: Medications  sodium chloride 0.9 % bolus 500 mL (has no administration in time range)  vancomycin (VANCOCIN) IVPB 1000 mg/200 mL premix (1,000 mg Intravenous New Bag/Given 10/09/21 1917)     IMPRESSION / MDM / ASSESSMENT AND PLAN / ED COURSE  I reviewed the triage vital signs and the nursing notes.                              Differential diagnosis includes, but is not limited to, cellulitis, septic arthritis, DVT, osteomyelitis   Patient's presentation is most consistent with acute presentation with potential threat to life or bodily function.   Patient's diagnosis is consistent with cellulitis of the left lower  extremity.  Patient presents to the ED complaining of worsening left lower extremity pain and edema.  It appears that the symptoms have been worsening over the past week.  Findings were concerning for cellulitis, patient does have an elevated white blood cell count but does not have an elevated lactic.  Patient has no evidence of DVT on ultrasound.  No evidence of osteomyelitis on x-ray.  At this time given the amount of edema, erythema as well as the fact that the patient lives alone I feel the patient would benefit from inpatient admission for IV antibiotics.  We will reach out to the hospitalist service at this time..  Patient is given ED precautions to return to the ED for any worsening or new symptoms.        FINAL CLINICAL IMPRESSION(S) / ED DIAGNOSES   Final diagnoses:  Cellulitis of left lower extremity     Rx / DC Orders   ED Discharge Orders     None        Note:  This document was prepared using Dragon voice recognition software and may include unintentional dictation errors.   Brynda Peon 10/09/21 1917    Harvest Dark, MD 10/09/21 [id number]

## 2021-10-09 NOTE — Assessment & Plan Note (Signed)
-   We will continue Neurontin. ?

## 2021-10-09 NOTE — Assessment & Plan Note (Signed)
-   The patient will be admitted to a medical bed. - We will continue antibiotic therapy with IV vancomycin. - Warm compresses will be utilized. - Pain management will be provided.

## 2021-10-09 NOTE — Assessment & Plan Note (Signed)
-   We will continue Arimidex. 

## 2021-10-09 NOTE — Assessment & Plan Note (Signed)
-   We will continue statin therapy. 

## 2021-10-09 NOTE — ED Triage Notes (Signed)
Pt brought in by her neighbor.  Pt has left leg pain and redness.  Pt reports leg was weeping yesterday.  Pt has swelling to left lower leg.    No known injury to leg.  Pt alert  speech clear.

## 2021-10-10 ENCOUNTER — Ambulatory Visit: Payer: Medicare HMO | Admitting: Internal Medicine

## 2021-10-10 DIAGNOSIS — L03116 Cellulitis of left lower limb: Secondary | ICD-10-CM | POA: Diagnosis not present

## 2021-10-10 LAB — BASIC METABOLIC PANEL
Anion gap: 6 (ref 5–15)
BUN: 27 mg/dL — ABNORMAL HIGH (ref 8–23)
CO2: 21 mmol/L — ABNORMAL LOW (ref 22–32)
Calcium: 8.1 mg/dL — ABNORMAL LOW (ref 8.9–10.3)
Chloride: 110 mmol/L (ref 98–111)
Creatinine, Ser: 1.25 mg/dL — ABNORMAL HIGH (ref 0.44–1.00)
GFR, Estimated: 44 mL/min — ABNORMAL LOW (ref >60)
Glucose, Bld: 103 mg/dL — ABNORMAL HIGH (ref 70–99)
Potassium: 3.7 mmol/L (ref 3.5–5.1)
Sodium: 137 mmol/L (ref 135–145)

## 2021-10-10 LAB — CBC
HCT: 32.4 % — ABNORMAL LOW (ref 36.0–46.0)
Hemoglobin: 10.2 g/dL — ABNORMAL LOW (ref 12.0–15.0)
MCH: 28.3 pg (ref 26.0–34.0)
MCHC: 31.5 g/dL (ref 30.0–36.0)
MCV: 89.8 fL (ref 80.0–100.0)
Platelets: 129 10*3/uL — ABNORMAL LOW (ref 150–400)
RBC: 3.61 MIL/uL — ABNORMAL LOW (ref 3.87–5.11)
RDW: 13.8 % (ref 11.5–15.5)
WBC: 10.1 10*3/uL (ref 4.0–10.5)
nRBC: 0 % (ref 0.0–0.2)

## 2021-10-10 MED ORDER — DICLOFENAC SODIUM 1 % EX GEL
4.0000 g | Freq: Four times a day (QID) | CUTANEOUS | Status: DC
  Administered 2021-10-10 – 2021-10-11 (×5): 4 g via TOPICAL
  Filled 2021-10-10: qty 100

## 2021-10-10 NOTE — Plan of Care (Signed)

## 2021-10-10 NOTE — Progress Notes (Signed)
PROGRESS NOTE    Latoya Stevenson  MLY:[id number] DOB: 1940-11-17 DOA: 10/09/2021 PCP: Glean Hess, MD    Brief Narrative:   81 y.o. Caucasian female with medical history significant for anxiety, osteoarthritis, asthma, breast cancer, GERD, on Arimidex, stage IIIb chronic kidney disease, DDD, hypertension comes lipidemia, RLS and hypothyroidism, who presented to the emergency room with acute onset of worsening left lower extremity pain with associated swelling and erythema since last week worsening over the last couple of days.  She was seen by her PCP at which time it was only edema without erythema.  It started around her ankle and extended to her calf area.  Her pain is extending from below her knee down to her ankle.  2 years ago she had a significant injury when she was mowing her lawn and picked up a piece of wire that became impaled in her leg.  She was taken to the OR for removal of the wire foreign body as well as irrigation but had no fractures.  She admits to mild chills with no reported fever.  Chest pain or dyspnea or cough.  No nausea or vomiting or abdominal pain.  She denies any history of thromboembolism..  No dysuria, oliguria or hematuria or flank pain.  No headache or dizziness or blurred vision.  No new lotions, or detergents or new medications.  Was on intravenous vancomycin from admission.  Cellulitic changes improving.  Patient has a severe penicillin allergy.   Assessment & Plan:   Principal Problem:   Left leg cellulitis Active Problems:   Acquired hypothyroidism   GERD without esophagitis   Dyslipidemia   Essential hypertension   History of breast cancer   Lumbar radiculopathy  Left leg cellulitis Unclear source.  Patient denies any trauma.  No history of bug bites or other injury to the area. Plan: Continue IV vancomycin for now Warm compress Pain management Leg elevation Therapy evaluations   Lumbar radiculopathy - We will continue Neurontin.    History of breast cancer - We will continue Arimidex   Essential hypertension - We will continue her amlodipine and hold off her chlorthalidone and Avapro.  Stage IIIb chronic kidney disease IV fluids Creatinine appears at or near baseline   Dyslipidemia - We will continue statin therapy.   GERD without esophagitis - We will continue PPI therapy.   Acquired hypothyroidism - We will continue Synthroid..   DVT prophylaxis: SQ Lovenox Code Status: Full Family Communication: None today.  Offered to call but patient declined Disposition Plan: Status is: Inpatient Remains inpatient appropriate because: Left leg cellulitis on IV antibiotics.  Possible discharge in 24 to 48 hours.   Level of care: Med-Surg  Consultants:  None  Procedures:  None  Antimicrobials: Vancomycin   Subjective: Patient seen and examined.  Resting in bed.  Reports interval improvement in cellulitic change.  No other complaints.  Objective: Vitals:   10/09/21 1956 10/09/21 2030 10/10/21 0551 10/10/21 0855  BP: (!) 128/57 121/61 (!) 107/58 104/65  Pulse: 79 78 95 78  Resp: '16 18  19  '$ Temp: 99.7 F (37.6 C) 98.8 F (37.1 C) 99.2 F (37.3 C) 98.6 F (37 C)  TempSrc: Oral  Oral   SpO2: 93% 98%  95%  Weight:      Height:        Intake/Output Summary (Last 24 hours) at 10/10/2021 1100 Last data filed at 10/10/2021 0900 Gross per 24 hour  Intake 481.32 ml  Output --  Net 481.[place]  ml   Filed Weights   10/09/21 1548  Weight: 77.6 kg    Examination:  General exam: Appears calm and comfortable  Respiratory system: Clear to auscultation. Respiratory effort normal. Cardiovascular system: S1-S2, RRR, no murmurs, no pedal edema Gastrointestinal system: Soft, NT/ND, normal bowel sounds Central nervous system: Alert and oriented. No focal neurological deficits. Extremities: Symmetric 5 x 5 power. Skin: Left lower leg erythematous, tender to touch, swollen, decreased ROM Psychiatry: Judgement  and insight appear normal. Mood & affect appropriate.     Data Reviewed: I have personally reviewed following labs and imaging studies  CBC: Recent Labs  Lab 10/07/21 1208 10/09/21 1550 10/10/21 0548  WBC 5.4 12.8* 10.1  NEUTROABS 3.5  --   --   HGB 12.0 12.2 10.2*  HCT 36.9 39.1 32.4*  MCV 87 91.4 89.8  PLT 176 155 643*   Basic Metabolic Panel: Recent Labs  Lab 10/07/21 1208 10/09/21 1550 10/10/21 0548  NA 144 138 137  K 5.0 3.7 3.7  CL 107* 106 110  CO2 22 24 21*  GLUCOSE 94 116* 103*  BUN 28* 31* 27*  CREATININE 1.26* 1.38* 1.25*  CALCIUM 10.0 9.2 8.1*   GFR: Estimated Creatinine Clearance: 36 mL/min (A) (by C-G formula based on SCr of 1.25 mg/dL (H)). Liver Function Tests: Recent Labs  Lab 10/07/21 1208  AST 17  ALT 16  ALKPHOS 76  BILITOT 0.7  PROT 6.6  ALBUMIN 4.2   No results for input(s): "LIPASE", "AMYLASE" in the last 168 hours. No results for input(s): "AMMONIA" in the last 168 hours. Coagulation Profile: No results for input(s): "INR", "PROTIME" in the last 168 hours. Cardiac Enzymes: No results for input(s): "CKTOTAL", "CKMB", "CKMBINDEX", "TROPONINI" in the last 168 hours. BNP (last 3 results) No results for input(s): "PROBNP" in the last 8760 hours. HbA1C: No results for input(s): "HGBA1C" in the last 72 hours. CBG: No results for input(s): "GLUCAP" in the last 168 hours. Lipid Profile: Recent Labs    10/07/21 1208  CHOL 154  HDL 55  LDLCALC 85  TRIG 70  CHOLHDL 2.8   Thyroid Function Tests: Recent Labs    10/07/21 1208  TSH 0.037*  FREET4 1.70   Anemia Panel: No results for input(s): "VITAMINB12", "FOLATE", "FERRITIN", "TIBC", "IRON", "RETICCTPCT" in the last 72 hours. Sepsis Labs: Recent Labs  Lab 10/09/21 1553 10/09/21 1834  LATICACIDVEN 1.4 1.1    No results found for this or any previous visit (from the past 240 hour(s)).       Radiology Studies: US Venous Img Lower Unilateral Left  Result Date:  10/09/2021 CLINICAL DATA:  pain, edema, erythema to LLE EXAM: Left LOWER EXTREMITY VENOUS DOPPLER ULTRASOUND TECHNIQUE: Gray-scale sonography with compression, as well as color and duplex ultrasound, were performed to evaluate the deep venous system(s) from the level of the common femoral vein through the popliteal and proximal calf veins. COMPARISON:  None Available. FINDINGS: VENOUS Normal compressibility of the common femoral, superficial femoral, and popliteal veins, as well as the visualized calf veins. Please note limited evaluation of the calf veins. Visualized portions of profunda femoral vein and great saphenous vein unremarkable. No filling defects to suggest DVT on grayscale or color Doppler imaging. Doppler waveforms show normal direction of venous flow, normal respiratory plasticity and response to augmentation. Limited views of the contralateral common femoral vein are unremarkable. OTHER Subcutaneus soft tissue edema of the calves. There is a 8.4 x 2.3 x 4.6 cm fluid density lesion within the left  popliteal fossa. Limitations: Edema. IMPRESSION: 1. No deep venous thrombosis left lower extremity with limited evaluation of the calf veins. 2. A 8.4 x 2.3 x 4.6 cm fluid density lesion within the left popliteal fossa. Finding could represent a Baker cyst versus liquified hematoma. Electronically Signed   By: Iven Finn M.D.   On: 10/09/2021 18:08   DG Tibia/Fibula Left  Result Date: 10/09/2021 CLINICAL DATA:  Erythema to left lower extremity.  Pain and edema. EXAM: LEFT TIBIA AND FIBULA - 2 VIEW COMPARISON:  Left tibia and fibula radiographs 12/09/2019 FINDINGS: Interval removal of the prior linear metallic foreign body previously seen within the distal medial calf. There is diffuse decreased bone mineralization. Moderate lateral and mild-to-moderate medial compartment joint space narrowing with moderate degenerative osteophytosis in both compartments. Severe patellofemoral joint space narrowing.  Mild patella alta. No joint effusion. No acute fracture or dislocation. Small plantar calcaneal heel spur. Moderate midfoot joint space narrowing and subchondral osteoarthritis. Diffuse subcutaneous fat edema appears similar to prior 12/09/2019 radiographs and may be systemic. IMPRESSION: 1. No acute fracture. Diffuse subcutaneous fat edema appears similar to prior 12/09/2019 radiographs and may be systemic. 2. Moderate lateral greater than medial knee compartment osteoarthritis. 3. Severe patellofemoral osteoarthritis. Electronically Signed   By: Yvonne Kendall M.D.   On: 10/09/2021 17:12   DG Chest 2 View  Result Date: 10/09/2021 CLINICAL DATA:  Lower extremity swelling. EXAM: CHEST - 2 VIEW COMPARISON:  September 09, 2016. FINDINGS: The heart size and mediastinal contours are within normal limits. Both lungs are clear. The visualized skeletal structures are unremarkable. IMPRESSION: No active cardiopulmonary disease. Electronically Signed   By: Marijo Conception M.D.   On: 10/09/2021 16:24        Scheduled Meds:  amLODipine  2.5 mg Oral Daily   anastrozole  1 mg Oral Daily   atorvastatin  40 mg Oral QHS   calcium-vitamin D  1 tablet Oral Daily   chlorthalidone  25 mg Oral BID   cholecalciferol  1,000 Units Oral Daily   enoxaparin (LOVENOX) injection  40 mg Subcutaneous Q24H   gabapentin  300 mg Oral QHS   irbesartan  300 mg Oral Daily   levothyroxine  150 mcg Oral QAC breakfast   loratadine  10 mg Oral Daily   multivitamin with minerals  1 tablet Oral Daily   pantoprazole  40 mg Oral Daily   tobramycin-dexamethasone  2 drop Both Eyes Q6H   vitamin E  400 Units Oral Daily   Continuous Infusions:  sodium chloride 100 mL/hr at 10/09/21 2207   vancomycin 750 mg (10/09/21 2208)     LOS: 1 day     Sidney Ace, MD [facility]   If 7PM-7AM, please contact night-coverage  10/10/2021, 11:00 AM

## 2021-10-10 NOTE — TOC Initial Note (Signed)
Transition of Care [facility]) - Initial/Assessment Note    Patient Details  Name: Latoya Stevenson MRN: [id number] Date of Birth: Aug 23, 1940  Transition of Care [facility]) CM/SW Contact:    Conception Oms, RN Phone Number: 10/10/2021, 9:07 AM  Clinical Narrative:                  Transition of Care (TOC) Screening Note   Patient Details  Name: Latoya Stevenson Date of Birth: [birth year]   Transition of Care [facility]) CM/SW Contact:    Conception Oms, RN Phone Number: 10/10/2021, 9:07 AM  Independent at home and does her own yard work. [name] PCP,   Transition of Care Department [facility]) has reviewed patient and no TOC needs have been identified at this time. We will continue to monitor patient advancement through interdisciplinary progression rounds. If new patient transition needs arise, please place a TOC consult.          Patient Goals and CMS Choice        Expected Discharge Plan and Services                                                Prior Living Arrangements/Services                       Activities of Daily Living Home Assistive Devices/Equipment: None ADL Screening (condition at time of admission) Patient's cognitive ability adequate to safely complete daily activities?: Yes Is the patient deaf or have difficulty hearing?: No Does the patient have difficulty seeing, even when wearing glasses/contacts?: No Does the patient have difficulty concentrating, remembering, or making decisions?: No Patient able to express need for assistance with ADLs?: Yes Does the patient have difficulty dressing or bathing?: No Independently performs ADLs?: Yes (appropriate for developmental age) Does the patient have difficulty walking or climbing stairs?: Yes Weakness of Legs: Left Weakness of Arms/Hands: None  Permission Sought/Granted                  Emotional Assessment              Admission diagnosis:  Cellulitis of left lower  extremity [L03.116] Left leg cellulitis [L03.116] Patient Active Problem List   Diagnosis Date Noted   Left leg cellulitis 10/09/2021   Dyslipidemia 10/09/2021   Essential hypertension 10/09/2021   History of breast cancer 10/09/2021   Lumbar radiculopathy 10/09/2021   Malignant neoplasm of 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 ([place]) 08/04/2019   Esophageal stenosis 01/28/2018   Varicose veins of left leg with edema 07/29/2017   Dysphagia, oropharyngeal 04/30/2017   Lumbar degenerative disc disease 04/30/2017   Spinal stenosis of lumbar region 12/08/2016   Idiopathic scoliosis of lumbar region 09/09/2016   Chronic midline low back pain with right-sided sciatica 07/28/2016   GERD without esophagitis 07/27/2015   Intermittent vertigo 07/27/2015   Osteoporosis, senile 07/27/2015   Seasonal allergic rhinitis due to pollen 07/27/2015   Acquired hypothyroidism 01/05/2015   Benign essential hypertension 06/29/2014   Mixed hyperlipidemia 06/29/2014   Stage 3b chronic kidney disease ([place]) 06/29/2014   Insomnia 11/11/2013   Mild reactive airways disease 11/11/2013   PCP:  Glean Hess, MD Pharmacy:   [facility] [place], [place] [address] Phone: [phone]  Fax: [phone]     Social Determinants of Health (SDOH) Interventions    Readmission Risk Interventions     No data to display

## 2021-10-10 NOTE — Plan of Care (Signed)

## 2021-10-10 NOTE — Evaluation (Signed)
Physical Therapy Evaluation Patient Details Name: Latoya Stevenson MRN: [id number] DOB: 12/13/1940 Today's Date: 10/10/2021  History of Present Illness  Latoya Stevenson is a 26yoF who comes [facility] 7/26 c LLE swelling, erythema, weeping, warmth- pt admitted with LLE cellulitis. PMH: GAD, OA, asthma, BrCA, GERD, CKD3b, DDD, HTN, RLS, hypoTSH. LLE vemous US showing no acute DVT but popliteal fossa fluid lesion not unlike Baker's Cyst. Tib/fib scan revealing of no acute fracture. At baseline, pt is limited in AMB due to chronic neuropathy post low back surgery, but typically AMB in home and in yard/garden modI without device on most days. No recent difficulty with falls or gross imbalance.  Clinical Impression  Pt admitted with above diagnosis. Pt currently with functional limitations due to the deficits listed below (see "PT Problem List"). Upon entry, pt in recliner done with lunch, and agreeable to participate. The pt is alert, pleasant, interactive, and able to provide info regarding prior level of function, both in tolerance and independence. Pt's lower left leg remains swollen and erythmatous, but pt reports improvement in both. Pt still unable to fully weight bear due to pain, but with RW is able to AMB 126f safely. Pt reports some new feelings of CL knee instability, but experienced no gross buckling in session. Pt educated on LAQ c isometric holds and feels her knee to be somewhat improved upon standing again. Pt feels quite close to her baseline other than pain, will need to use one of her RW once she gets home, wants to start AMB to bathroom today rather than using purewick for toiletting needs. Patient's performance this date reveals decreased ability, independence, and tolerance in performing all basic mobility required for performance of activities of daily living. Pt requires additional DME, close physical assistance, and cues for safe participate in mobility. Pt will benefit from skilled PT intervention  to increase independence and safety with basic mobility in preparation for discharge to the venue listed below.     No data found.      Recommendations for follow up therapy are one component of a multi-disciplinary discharge planning process, led by the attending physician.  Recommendations may be updated based on patient status, additional functional criteria and insurance authorization.  Follow Up Recommendations No PT follow up      Assistance Recommended at Discharge Set up Supervision/Assistance  Patient can return home with the following  Help with stairs or ramp for entrance;Assist for transportation;Assistance with cooking/housework    Equipment Recommendations None recommended by PT  Recommendations for Other Services       Functional Status Assessment Patient has had a recent decline in their functional status and demonstrates the ability to make significant improvements in function in a reasonable and predictable amount of time.     Precautions / Restrictions Precautions Precautions: Fall Restrictions Weight Bearing Restrictions: No LLE Weight Bearing: Weight bearing as tolerated      Mobility  Bed Mobility Overal bed mobility: Modified Independent                  Transfers Overall transfer level: Needs assistance Equipment used: Rolling walker (2 wheels) Transfers: Sit to/from Stand Sit to Stand: Supervision, From elevated surface           General transfer comment: moderate to heavy effort, requires heavy UE use which is complicated by Left cubital IV    Ambulation/Gait Ambulation/Gait assistance: Independent Gait Distance (Feet): 100 Feet Assistive device: Rolling walker (2 wheels) Gait Pattern/deviations: WFL(Within Functional Limits), Knees  buckling, Step-to pattern       General Gait Details: pain precludes AMB without device, Pt does well with RW slowly, but reports her CL knee feels strength and unstable, buckling prone, this is a  new phenomenon (no symptoms of hypotension despite ealier low BP)  Stairs            Wheelchair Mobility    Modified Rankin (Stroke Patients Only)       Balance Overall balance assessment: Modified Independent                                           Pertinent Vitals/Pain Pain Assessment Pain Assessment: 0-10 Pain Score: 4  Pain Location: Left ankl/cxalf    Home Living Family/patient expects to be discharged to:: Private residence Living Arrangements: Spouse/significant other (pt's significant other) Available Help at Discharge: Family;Available PRN/intermittently Type of Home: House Home Access: Stairs to enter Entrance Stairs-Rails: Right Entrance Stairs-Number of Steps: 3   Home Layout: One level Home Equipment: Rollator (4 wheels);Rolling Walker (2 wheels);Cane - single point      Prior Function Prior Level of Function : Independent/Modified Independent                     Hand Dominance        Extremity/Trunk Assessment                Communication      Cognition Arousal/Alertness: Awake/alert Behavior During Therapy: WFL for tasks assessed/performed Overall Cognitive Status: Within Functional Limits for tasks assessed                                          General Comments      Exercises General Exercises - Lower Extremity Long Arc Quad: AROM, Right, 10 reps, Seated   Assessment/Plan    PT Assessment Patient needs continued PT services  PT Problem List Decreased range of motion;Decreased activity tolerance;Decreased balance;Decreased mobility;Decreased skin integrity       PT Treatment Interventions DME instruction;Balance training;Gait training;Stair training;Functional mobility training;Therapeutic activities;Therapeutic exercise;Patient/family education    PT Goals (Current goals can be found in the Care Plan section)  Acute Rehab PT Goals Patient Stated Goal: regain AMB without  device PT Goal Formulation: With patient Time For Goal Achievement: 10/24/21 Potential to Achieve Goals: Good    Frequency Min 2X/week     Co-evaluation               AM-PAC PT "6 Clicks" Mobility  Outcome Measure Help needed turning from your back to your side while in a flat bed without using bedrails?: None Help needed moving from lying on your back to sitting on the side of a flat bed without using bedrails?: None Help needed moving to and from a bed to a chair (including a wheelchair)?: A Little Help needed standing up from a chair using your arms (e.g., wheelchair or bedside chair)?: A Little Help needed to walk in hospital room?: A Little Help needed climbing 3-5 steps with a railing? : A Little 6 Click Score: 20    End of Session Equipment Utilized During Treatment: Gait belt Activity Tolerance: Patient tolerated treatment well;Patient limited by pain Patient left: in bed;with call bell/phone within reach Nurse Communication: Mobility status PT Visit  Diagnosis: Difficulty in walking, not elsewhere classified (R26.2);Other abnormalities of gait and mobility (R26.89);Muscle weakness (generalized) (M62.81)    Time: 0415-9301 PT Time Calculation (min) (ACUTE ONLY): 20 min   Charges:   PT Evaluation $PT Eval Low Complexity: 1 Low PT Treatments $Therapeutic Activity: 8-22 mins       2:00 PM, 10/10/21 Etta Grandchild, PT, DPT Physical Therapist - [facility]  [phone] ([place])    Karandeep Resende C 10/10/2021, 1:53 PM

## 2021-10-10 NOTE — Progress Notes (Signed)
Pharmacy Antibiotic Note  Latoya Stevenson is a 81 y.o. female admitted on 10/09/2021 with cellulitis.  Pharmacy has been consulted for vancomycin dosing.  Plan: Vancomycin 1750 mg load given 7/26, then Vancomycin 750 mg IV Q 24 hrs.  Goal AUC 400-550. Expected AUC: 455 SCr used: 1.25 Vd 0.72 (BMI borderline)  Plan to order vancomycin levels after 4th or 5th dose.   Height: '5\' 3"'$  (160 cm) Weight: 77.6 kg (171 lb) IBW/kg (Calculated) : 52.4  Temp (24hrs), Avg:99 F (37.2 C), Min:98.6 F (37 C), Max:99.7 F (37.6 C)  Recent Labs  Lab 10/07/21 1208 10/09/21 1550 10/09/21 1553 10/09/21 1834 10/10/21 0548  WBC 5.4 12.8*  --   --  10.1  CREATININE 1.26* 1.38*  --   --  1.25*  LATICACIDVEN  --   --  1.4 1.1  --      Estimated Creatinine Clearance: 36 mL/min (A) (by C-G formula based on SCr of 1.25 mg/dL (H)).    Allergies  Allergen Reactions   Flagyl [Metronidazole Hcl] Swelling    Swelling in face, mouth, throat, palms of feet and hands.   Furosemide Itching    Made side of mouth irritated and puffy.  The rim inside her lip swelled up   Macrodantin [Nitrofurantoin Macrocrystal] Anaphylaxis, Hives and Swelling   Penicillins Anaphylaxis and Swelling    PATIENT HAD A PCN REACTION WITH IMMEDIATE RASH, FACIAL/TONGUE/THROAT SWELLING, SOB, OR LIGHTHEADEDNESS WITH HYPOTENSION:  #  #  #  YES  #  #  #   Has patient had a PCN reaction causing severe rash involving mucus membranes or skin necrosis: No Has patient had a PCN reaction that required hospitalization: No Has patient had a PCN reaction occurring within the last 10 years: No If all of the above answers are "NO", then may proceed with Cephalosporin use.    Celebrex [Celecoxib] Swelling    Swelling in joints.  Did not agree with her. Did not help her either   Other     Pt says it was recently discovered she allergic to an ingredient in local anesthesia but she can't recall the name. (epinephrine and the additive used for the  biopsy. Gets very nervous. Feels internally shaking. 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  Christophr Calix Rodriguez-Guzman PharmD, BCPS 10/10/2021 4:07 PM

## 2021-10-11 DIAGNOSIS — L03116 Cellulitis of left lower limb: Secondary | ICD-10-CM | POA: Diagnosis not present

## 2021-10-11 LAB — CREATININE, SERUM
Creatinine, Ser: 1.29 mg/dL — ABNORMAL HIGH (ref 0.44–1.00)
GFR, Estimated: 42 mL/min — ABNORMAL LOW (ref >60)

## 2021-10-11 MED ORDER — DOXYCYCLINE HYCLATE 100 MG PO CAPS
100.0000 mg | ORAL_CAPSULE | Freq: Two times a day (BID) | ORAL | 0 refills | Status: AC
Start: 2021-10-12 — End: 2021-10-17

## 2021-10-11 MED ORDER — KETOROLAC TROMETHAMINE 15 MG/ML IJ SOLN
15.0000 mg | Freq: Four times a day (QID) | INTRAMUSCULAR | Status: DC
  Administered 2021-10-11 (×2): 15 mg via INTRAVENOUS
  Filled 2021-10-11 (×2): qty 1

## 2021-10-11 NOTE — Plan of Care (Signed)

## 2021-10-11 NOTE — Discharge Summary (Signed)
Physician Discharge Summary  KARELYN BRISBY EUM:[id number] DOB: 1940/10/12 DOA: 10/09/2021  PCP: Glean Hess, MD  Admit date: 10/09/2021 Discharge date: 10/11/2021  Admitted From: Home Disposition: Home  Recommendations for Outpatient Follow-up:  Follow up with PCP in 1-2 weeks   Home Health: Yes PT OT Equipment/Devices: None  Discharge Condition: Stable CODE STATUS: Full Diet recommendation: Regular  Brief/Interim Summary: 81 y.o. Caucasian female with medical history significant for anxiety, osteoarthritis, asthma, breast cancer, GERD, on Arimidex, stage IIIb chronic kidney disease, DDD, hypertension comes lipidemia, RLS and hypothyroidism, who presented to the emergency room with acute onset of worsening left lower extremity pain with associated swelling and erythema since last week worsening over the last couple of days.  She was seen by her PCP at which time it was only edema without erythema.  It started around her ankle and extended to her calf area.  Her pain is extending from below her knee down to her ankle.  2 years ago she had a significant injury when she was mowing her lawn and picked up a piece of wire that became impaled in her leg.  She was taken to the OR for removal of the wire foreign body as well as irrigation but had no fractures.  She admits to mild chills with no reported fever.  Chest pain or dyspnea or cough.  No nausea or vomiting or abdominal pain.  She denies any history of thromboembolism..  No dysuria, oliguria or hematuria or flank pain.  No headache or dizziness or blurred vision.  No new lotions, or detergents or new medications.   Was on intravenous vancomycin from admission.  Cellulitic changes improving.  Patient has a severe penicillin allergy.  Physical exam markedly improved at time of discharge.  Considering penicillin allergy will transition to p.o. doxycycline to complete additional 5-day course for total of 7 days of antibiotics.  Follow-up with  PCP within 1 to 2 weeks    Discharge Diagnoses:  Principal Problem:   Left leg cellulitis Active Problems:   Acquired hypothyroidism   GERD without esophagitis   Dyslipidemia   Essential hypertension   History of breast cancer   Lumbar radiculopathy  Left leg cellulitis Unclear source.  Patient denies any trauma.  No history of bug bites or other injury to the area. Was on IV vancomycin in house.  Improving over interval.  Cellulitic change markedly improved at time of discharge. Plan: Discontinue IV vancomycin.  Start p.o. doxycycline.  Additional 5 days prescribed for total 7-day antibiotic course.  Stable for discharge home at this time.  Home health PT and OT ordered.    Discharge Instructions  Discharge Instructions     Ambulatory referral to Physical Therapy   Complete by: As directed    Diet - low sodium heart healthy   Complete by: As directed    Increase activity slowly   Complete by: As directed       Allergies as of 10/11/2021       Reactions   Flagyl [metronidazole Hcl] Swelling   Swelling in face, mouth, throat, palms of feet and hands.   Furosemide Itching   Made side of mouth irritated and puffy.  The rim inside her lip swelled up   Macrodantin [nitrofurantoin Macrocrystal] Anaphylaxis, Hives, Swelling   Penicillins Anaphylaxis, Swelling   PATIENT HAD A PCN REACTION WITH IMMEDIATE RASH, FACIAL/TONGUE/THROAT SWELLING, SOB, OR LIGHTHEADEDNESS WITH HYPOTENSION:  #  #  #  YES  #  #  #  Has patient had a PCN reaction causing severe rash involving mucus membranes or skin necrosis: No Has patient had a PCN reaction that required hospitalization: No Has patient had a PCN reaction occurring within the last 10 years: No If all of the above answers are "NO", then may proceed with Cephalosporin use.   Celebrex [celecoxib] Swelling   Swelling in joints.  Did not agree with her. Did not help her either   Other    Pt says it was recently discovered she allergic to  an ingredient in local anesthesia but she can't recall the name. (epinephrine and the additive used for the biopsy. Gets very nervous. Feels internally shaking. 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   amLODipine 2.5 MG tablet Commonly known as: NORVASC Take 2.5 mg by mouth daily.   anastrozole 1 MG tablet Commonly known as: ARIMIDEX TAKE 1 TABLET EVERY DAY   atorvastatin 40 MG tablet Commonly known as: LIPITOR Take 1 tablet (40 mg total) by mouth at bedtime.   Calcium Carbonate-Vitamin D 600-400 MG-UNIT tablet Take 2 tablets by mouth daily.   chlorthalidone 25 MG tablet Commonly known as: HYGROTON Take 25 mg by mouth in the morning and at bedtime.   cholecalciferol 25 MCG (1000 UNIT) tablet Commonly known as: VITAMIN D3 Take 1,000 Units by mouth daily.   denosumab 60 MG/ML Sosy injection Commonly known as: PROLIA Inject 60 mg into the skin every 6 (six) months.   doxycycline 100 MG capsule Commonly known as: VIBRAMYCIN Take 1 capsule (100 mg total) by mouth 2 (two) times daily for 5 days. Start taking on: October 12, 2021   fexofenadine 180 MG tablet Commonly known as: ALLEGRA Take 180 mg by mouth daily.   fluticasone 50 MCG/ACT nasal spray Commonly known as: FLONASE Place 2 sprays into both nostrils daily as needed for allergies.   gabapentin 300 MG capsule Commonly known as: NEURONTIN Take 300 mg by mouth at bedtime.   GLUCOSAMINE-CHONDROITIN PO Take 1 tablet by mouth daily.   irbesartan 300 MG tablet Commonly known as: AVAPRO Take 1 tablet (300 mg total) by mouth daily.   levothyroxine 150 MCG tablet Commonly known as: SYNTHROID Take 1 tablet (150 mcg total) by mouth daily before breakfast.   multivitamin with minerals Tabs tablet Take 1  tablet by mouth daily.   omeprazole 40 MG capsule Commonly known as: PRILOSEC Take 40 mg by mouth daily before breakfast.   tobramycin-dexamethasone ophthalmic solution Commonly known as: TobraDex Place 2 drops into both eyes every 6 (six) hours.   vitamin E 180 MG (400 UNITS) capsule Take 400 Units by mouth daily.   Voltaren 1 % Gel Generic drug: diclofenac Sodium Apply 2 g topically 4 (four) times daily.               Durable Medical Equipment  (From admission, onward)           Start     Ordered   10/11/21 1734  For home use only DME 3 n 1  Once        10/11/21 1733            Allergies  Allergen Reactions   Flagyl [Metronidazole Hcl] Swelling  Swelling in face, mouth, throat, palms of feet and hands.   Furosemide Itching    Made side of mouth irritated and puffy.  The rim inside her lip swelled up   Macrodantin [Nitrofurantoin Macrocrystal] Anaphylaxis, Hives and Swelling   Penicillins Anaphylaxis and Swelling    PATIENT HAD A PCN REACTION WITH IMMEDIATE RASH, FACIAL/TONGUE/THROAT SWELLING, SOB, OR LIGHTHEADEDNESS WITH HYPOTENSION:  #  #  #  YES  #  #  #   Has patient had a PCN reaction causing severe rash involving mucus membranes or skin necrosis: No Has patient had a PCN reaction that required hospitalization: No Has patient had a PCN reaction occurring within the last 10 years: No If all of the above answers are "NO", then may proceed with Cephalosporin use.    Celebrex [Celecoxib] Swelling    Swelling in joints.  Did not agree with her. Did not help her either   Other     Pt says it was recently discovered she allergic to an ingredient in local anesthesia but she can't recall the name. (epinephrine and the additive used for the biopsy. Gets very nervous. Feels internally shaking. 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 COMPARISON:  None Available. FINDINGS: VENOUS Normal compressibility of the common femoral, superficial femoral, and popliteal veins, as well as the visualized calf veins. Please note limited evaluation of the calf veins. Visualized portions of profunda femoral vein and great saphenous vein unremarkable. No filling defects to suggest DVT on grayscale or color Doppler imaging. Doppler waveforms show normal direction of venous flow, normal respiratory plasticity and response to augmentation. Limited views of the contralateral common femoral vein are unremarkable. OTHER Subcutaneus soft tissue edema of the calves. There is a 8.4 x 2.3 x 4.6 cm fluid density lesion within the left popliteal fossa. Limitations: Edema. IMPRESSION: 1. No deep venous thrombosis left lower extremity with limited evaluation of the calf veins. 2. A 8.4 x 2.3 x 4.6 cm fluid density lesion within the left popliteal fossa. Finding could represent a Baker cyst versus liquified hematoma. Electronically Signed   By: Iven Finn M.D.   On: 10/09/2021 18:08   DG Tibia/Fibula Left  Result Date: 10/09/2021 CLINICAL DATA:  Erythema to left lower extremity.  Pain and edema. EXAM: LEFT TIBIA AND FIBULA - 2 VIEW COMPARISON:  Left tibia and fibula radiographs 12/09/2019 FINDINGS: Interval removal of the prior linear metallic foreign body previously seen within the distal medial calf. There is diffuse decreased bone mineralization. Moderate lateral and mild-to-moderate medial compartment joint space narrowing with moderate degenerative osteophytosis in both compartments. Severe patellofemoral joint space  narrowing. Mild patella alta. No joint effusion. No acute fracture or dislocation. Small plantar calcaneal heel spur. Moderate midfoot joint space narrowing and subchondral osteoarthritis. Diffuse subcutaneous fat edema appears similar to prior 12/09/2019 radiographs and may be systemic. IMPRESSION: 1. No acute fracture. Diffuse subcutaneous fat edema appears similar to prior 12/09/2019 radiographs and may be systemic. 2. Moderate lateral greater than medial knee compartment osteoarthritis. 3. Severe patellofemoral osteoarthritis. Electronically Signed   By:  Yvonne Kendall M.D.   On: 10/09/2021 17:12   DG Chest 2 View  Result Date: 10/09/2021 CLINICAL DATA:  Lower extremity swelling. EXAM: CHEST - 2 VIEW COMPARISON:  September 09, 2016. FINDINGS: The heart size and mediastinal contours are within normal limits. Both lungs are clear. The visualized skeletal structures are unremarkable. IMPRESSION: No active cardiopulmonary disease. Electronically Signed   By: Marijo Conception M.D.   On: 10/09/2021 16:24      Subjective: Seen and examined at the time of discharge.  Stable no distress.  Pain improved.  Cellulitic change improved.  Stable for discharge home.  Discharge Exam: Vitals:   10/11/21 0559 10/11/21 0855  BP: 137/79 113/66  Pulse: 78 82  Resp: 18 16  Temp: 98 F (36.7 C) 98.3 F (36.8 C)  SpO2: 97% 97%   Vitals:   10/10/21 1937 10/10/21 2359 10/11/21 0559 10/11/21 0855  BP: 127/64 135/70 137/79 113/66  Pulse: 87 86 78 82  Resp: '17 17 18 16  '$ Temp: 98.7 F (37.1 C)  98 F (36.7 C) 98.3 F (36.8 C)  TempSrc:      SpO2: 98% 96% 97% 97%  Weight:      Height:        General: Pt is alert, awake, not in acute distress Cardiovascular: RRR, S1/S2 +, no rubs, no gallops Respiratory: CTA bilaterally, no wheezing, no rhonchi Abdominal: Soft, NT, ND, bowel sounds + Extremities: Left leg cellulitic changes, markedly improved from prior    The results of significant diagnostics from this  hospitalization (including imaging, microbiology, ancillary and laboratory) are listed below for reference.     Microbiology: No results found for this or any previous visit (from the past 240 hour(s)).   Labs: BNP (last 3 results) Recent Labs    10/09/21 1553  BNP 409.8*   Basic Metabolic Panel: Recent Labs  Lab 10/07/21 1208 10/09/21 1550 10/10/21 0548 10/11/21 1148  NA 144 138 137  --   K 5.0 3.7 3.7  --   CL 107* 106 110  --   CO2 22 24 21*  --   GLUCOSE 94 116* 103*  --   BUN 28* 31* 27*  --   CREATININE 1.26* 1.38* 1.25* 1.29*  CALCIUM 10.0 9.2 8.1*  --    Liver Function Tests: Recent Labs  Lab 10/07/21 1208  AST 17  ALT 16  ALKPHOS 76  BILITOT 0.7  PROT 6.6  ALBUMIN 4.2   No results for input(s): "LIPASE", "AMYLASE" in the last 168 hours. No results for input(s): "AMMONIA" in the last 168 hours. CBC: Recent Labs  Lab 10/07/21 1208 10/09/21 1550 10/10/21 0548  WBC 5.4 12.8* 10.1  NEUTROABS 3.5  --   --   HGB 12.0 12.2 10.2*  HCT 36.9 39.1 32.4*  MCV 87 91.4 89.8  PLT 176 155 129*   Cardiac Enzymes: No results for input(s): "CKTOTAL", "CKMB", "CKMBINDEX", "TROPONINI" in the last 168 hours. BNP: Invalid input(s): "POCBNP" CBG: No results for input(s): "GLUCAP" in the last 168 hours. D-Dimer No results for input(s): "DDIMER" in the last 72 hours. Hgb A1c No results for input(s): "HGBA1C" in the last 72 hours. Lipid Profile No results for input(s): "CHOL", "HDL", "LDLCALC", "TRIG", "CHOLHDL", "LDLDIRECT" in the last 72 hours. Thyroid function studies No results for input(s): "TSH", "T4TOTAL", "T3FREE", "THYROIDAB" in the last 72 hours.  Invalid input(s): "FREET3" Anemia work up No results for input(s): "VITAMINB12", "FOLATE", "FERRITIN", "TIBC", "IRON", "RETICCTPCT" in the last 72 hours.  Urinalysis    Component Value Date/Time   COLORURINE YELLOW 08/16/2009 0950   APPEARANCEUR Clear 10/07/2021 1208   LABSPEC 1.012 08/16/2009 0950    PHURINE 6.0 08/16/2009 0950   GLUCOSEU Negative 10/07/2021 1208   HGBUR NEGATIVE 08/16/2009 0950   BILIRUBINUR Negative 10/07/2021 [place] 08/16/2009 0950   PROTEINUR Negative 10/07/2021 1208   PROTEINUR NEGATIVE 08/16/2009 0950   UROBILINOGEN 0.2 08/16/2009 0950   NITRITE Negative 10/07/2021 1208   NITRITE NEGATIVE 08/16/2009 0950   LEUKOCYTESUR Negative 10/07/2021 1208   Sepsis Labs Recent Labs  Lab 10/07/21 1208 10/09/21 1550 10/10/21 0548  WBC 5.4 12.8* 10.1   Microbiology No results found for this or any previous visit (from the past 240 hour(s)).   Time coordinating discharge: Over 30 minutes  SIGNED:   Sidney Ace, MD  [facility] 10/11/2021, 5:36 PM Pager   If 7PM-7AM, please contact night-coverage

## 2021-10-11 NOTE — Progress Notes (Signed)
Pharmacy Antibiotic Note  Latoya Stevenson is a 81 y.o. female admitted on 10/09/2021 with cellulitis.  Pharmacy has been consulted for vancomycin dosing.  Plan: Continue vancomycin 750 mg IV Q 24 hrs.  Goal AUC 400-550. Expected AUC: 468; Cmin 13.9 SCr used: 1.25 Vd 0.72 (BMI borderline)  Plan to order vancomycin levels after 4th or 5th dose.   Height: '5\' 3"'$  (160 cm) Weight: 77.6 kg (171 lb) IBW/kg (Calculated) : 52.4  Temp (24hrs), Avg:98.5 F (36.9 C), Min:98 F (36.7 C), Max:98.7 F (37.1 C)  Recent Labs  Lab 10/07/21 1208 10/09/21 1550 10/09/21 1553 10/09/21 1834 10/10/21 0548  WBC 5.4 12.8*  --   --  10.1  CREATININE 1.26* 1.38*  --   --  1.25*  LATICACIDVEN  --   --  1.4 1.1  --      Estimated Creatinine Clearance: 36 mL/min (A) (by C-G formula based on SCr of 1.25 mg/dL (H)).    Allergies  Allergen Reactions   Flagyl [Metronidazole Hcl] Swelling    Swelling in face, mouth, throat, palms of feet and hands.   Furosemide Itching    Made side of mouth irritated and puffy.  The rim inside her lip swelled up   Macrodantin [Nitrofurantoin Macrocrystal] Anaphylaxis, Hives and Swelling   Penicillins Anaphylaxis and Swelling    PATIENT HAD A PCN REACTION WITH IMMEDIATE RASH, FACIAL/TONGUE/THROAT SWELLING, SOB, OR LIGHTHEADEDNESS WITH HYPOTENSION:  #  #  #  YES  #  #  #   Has patient had a PCN reaction causing severe rash involving mucus membranes or skin necrosis: No Has patient had a PCN reaction that required hospitalization: No Has patient had a PCN reaction occurring within the last 10 years: No If all of the above answers are "NO", then may proceed with Cephalosporin use.    Celebrex [Celecoxib] Swelling    Swelling in joints.  Did not agree with her. Did not help her either   Other     Pt says it was recently discovered she allergic to an ingredient in local anesthesia but she can't recall the name. (epinephrine and the additive used for the biopsy. Gets very  nervous. Feels internally shaking. 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  Glean Salvo, PharmD Clinical Pharmacist  10/11/2021 2:48 PM

## 2021-10-11 NOTE — Care Management Important Message (Signed)
Important Message  Patient Details  Name: Latoya Stevenson MRN: [id number] Date of Birth: 11-20-1940   Medicare Important Message Given:  N/A - LOS <3 / Initial given by admissions     [name] A Dwaine Pringle 10/11/2021, 9:24 AM

## 2021-10-11 NOTE — Progress Notes (Signed)
Patient discharged to Home, via personal vehicle with Boyfriend Quillian Quince.

## 2021-10-11 NOTE — Evaluation (Signed)
Occupational Therapy Evaluation Patient Details Name: Latoya Stevenson MRN: [id number] DOB: [birth year] Today's Date: 10/11/2021   History of Present Illness Pt is a 81 year old female admitted with left leg cellulitis after pesenting to the ED with acute onset of worsening left lower extremity pain with associated swelling and erythema since last week worsening over the last couple of days. PMH significant for anxiety, osteoarthritis, asthma, breast cancer, GERD, on Arimidex, stage IIIb chronic kidney disease, DDD, hypertension comes lipidemia, RLS and hypothyroidism   Clinical Impression   Pt greeted in bed agreeable to OT evaluation. Pt is alert and oriented x4. Pt presents with deficits in activity tolerance due to pain affecting safe and optimal ADL completion. Increased generalized pain reported as compared to yesterday. ADL mobility completed with RW with supervision as pt with increased pain with mobility. Frequent vcs required for technique. Education provided re: use of DME after discharge for safe ADL completion, potential HHOT to facilitate safe home set up. Pt is left in bedside chair, NAD, all needs met. OT will follow acutely.      Recommendations for follow up therapy are one component of a multi-disciplinary discharge planning process, led by the attending physician.  Recommendations may be updated based on patient status, additional functional criteria and insurance authorization.   Follow Up Recommendations  Home health OT    Assistance Recommended at Discharge Intermittent Supervision/Assistance  Patient can return home with the following A little help with bathing/dressing/bathroom    Functional Status Assessment  Patient has had a recent decline in their functional status and demonstrates the ability to make significant improvements in function in a reasonable and predictable amount of time.  Equipment Recommendations  BSC/3in1;Tub/shower bench    Recommendations for  Other Services       Precautions / Restrictions Precautions Precautions: Fall Restrictions Weight Bearing Restrictions: Yes LLE Weight Bearing: Weight bearing as tolerated      Mobility Bed Mobility Overal bed mobility: Modified Independent                  Transfers Overall transfer level: Needs assistance Equipment used: Rolling walker (2 wheels) Transfers: Sit to/from Stand Sit to Stand: Supervision                  Balance Overall balance assessment: Needs assistance Sitting-balance support: Feet supported Sitting balance-Leahy Scale: Good     Standing balance support: Bilateral upper extremity supported, During functional activity Standing balance-Leahy Scale: Good                             ADL either performed or assessed with clinical judgement   ADL Overall ADL's : Needs assistance/impaired     Grooming: Wash/dry hands;Standing;Supervision/safety Grooming Details (indicate cue type and reason): sink level with RW             Lower Body Dressing: Maximal assistance Lower Body Dressing Details (indicate cue type and reason): socks at bed level Toilet Transfer: Nature conservation officer;Ambulation Toilet Transfer Details (indicate cue type and reason): frequent vcs for technique Toileting- Clothing Manipulation and Hygiene: Supervision/safety;Sit to/from stand       Functional mobility during ADLs: Supervision/safety;Min guard;Rolling walker (2 wheels)       Vision Patient Visual Report: No change from baseline       Perception     Praxis      Pertinent Vitals/Pain Pain Assessment Pain Assessment: 0-10 Pain Score: 7  Pain  Location: LLE Pain Descriptors / Indicators: Jabbing Pain Intervention(s): Limited activity within patient's tolerance, Monitored during session, Repositioned     Hand Dominance     Extremity/Trunk Assessment Upper Extremity Assessment Upper Extremity Assessment: Overall WFL for tasks  assessed   Lower Extremity Assessment Lower Extremity Assessment: Generalized weakness   Cervical / Trunk Assessment Cervical / Trunk Assessment: Normal   Communication Communication Communication: No difficulties   Cognition Arousal/Alertness: Awake/alert Behavior During Therapy: WFL for tasks assessed/performed Overall Cognitive Status: Within Functional Limits for tasks assessed                                       General Comments       Exercises     Shoulder Instructions      Home Living Family/patient expects to be discharged to:: Private residence Living Arrangements: Spouse/significant other Available Help at Discharge: Family;Available PRN/intermittently (reports boyfriend works second shift, neighbor can assist as needed) Type of Home: House Home Access: Stairs to enter [employer] of Steps: 3 Entrance Stairs-Rails: Right Home Layout: One level     Bathroom Shower/Tub: Teacher, early years/pre: Handicapped height Bathroom Accessibility: Yes   Home Equipment: [place] (4 wheels);Rolling Walker (2 wheels);Cane - single point          Prior Functioning/Environment Prior Level of Function : Independent/Modified Independent                        OT Problem List: Decreased activity tolerance      OT Treatment/Interventions: Self-care/ADL training;DME and/or AE instruction;Therapeutic exercise;Therapeutic activities;Patient/family education;Neuromuscular education;Energy conservation    OT Goals(Current goals can be found in the care plan section) Acute Rehab OT Goals Patient Stated Goal: go home OT Goal Formulation: With patient Time For Goal Achievement: 10/25/21 Potential to Achieve Goals: Good ADL Goals Pt Will Perform Grooming: with modified independence;standing Pt Will Perform Lower Body Dressing: with modified independence;sit to/from stand Pt Will Transfer to Toilet: with modified  independence;ambulating Pt Will Perform Toileting - Clothing Manipulation and hygiene: with modified independence;sit to/from stand  OT Frequency: Min 2X/week    Co-evaluation              AM-PAC OT "6 Clicks" Daily Activity     Outcome Measure Help from another person eating meals?: None Help from another person taking care of personal grooming?: None Help from another person toileting, which includes using toliet, bedpan, or urinal?: None Help from another person bathing (including washing, rinsing, drying)?: A Little Help from another person to put on and taking off regular upper body clothing?: None Help from another person to put on and taking off regular lower body clothing?: A Little 6 Click Score: 22   End of Session Equipment Utilized During Treatment: Rolling walker (2 wheels) Nurse Communication: Mobility status  Activity Tolerance: Patient tolerated treatment well Patient left: in chair;with call bell/phone within reach;with chair alarm set  OT Visit Diagnosis: Unsteadiness on feet (R26.81)                Time: 9179-1505 OT Time Calculation (min): 26 min Charges:  OT General Charges $OT Visit: 1 Visit OT Evaluation $OT Eval Low Complexity: 1 Low  Shanon Payor, OTD OTR/L  10/11/21, 10:18 AM

## 2021-10-14 ENCOUNTER — Other Ambulatory Visit: Payer: Self-pay | Admitting: *Deleted

## 2021-10-14 NOTE — Patient Outreach (Signed)
  Care Coordination [facility] Note Transition Care Management Follow-up Telephone Call Date of discharge and from where: [phone] [facility] How have you been since you were released from the hospital? Feeling better getting around Any questions or concerns? No  Items Reviewed: Did the pt receive and understand the discharge instructions provided? Yes  Medications obtained and verified? Y  Other? No  Any new allergies since your discharge? No  Dietary orders reviewed? No Do you have support at home? Yes   Home Care and Equipment/Supplies: Were home health services ordered? no If so, what is the name of the agency? N/a   Has the agency set up a time to come to the patient's home? no Were any new equipment or medical supplies ordered?  No What is the name of the medical supply agency? N/a  Were you able to get the supplies/equipment? not applicable Do you have any questions related to the use of the equipment or supplies? No  Functional Questionnaire: (I = Independent and D = Dependent) ADLs: I  Bathing/Dressing- I  Meal Prep- I  Eating- I  Maintaining continence- I  Transferring/Ambulation- I  Managing Meds- I  Follow up appointments reviewed:  PCP Hospital f/u appt confirmed? No  [facility] f/u appt confirmed? No   Are transportation arrangements needed? No  If their condition worsens, is the pt aware to call PCP or go to the Emergency Dept.? Y  Was the patient provided with contact information for the PCP's office or ED? Yes Was to pt encouraged to call back with questions or concerns? Yes  SDOH assessments and interventions completed:   Yes  Care Coordination Interventions Activated:  Yes Care Coordination Interventions:  PCP follow up appointment requested  Encounter Outcome:  Pt. Visit Completed

## 2021-10-24 ENCOUNTER — Other Ambulatory Visit: Payer: Self-pay

## 2021-10-24 ENCOUNTER — Emergency Department: Payer: Medicare HMO

## 2021-10-24 ENCOUNTER — Emergency Department
Admission: EM | Admit: 2021-10-24 | Discharge: 2021-10-25 | Disposition: A | Discharge status: Home / Self Care | Payer: Medicare HMO | Attending: Emergency Medicine | Admitting: Emergency Medicine

## 2021-10-24 ENCOUNTER — Encounter: Payer: Self-pay | Admitting: Emergency Medicine

## 2021-10-24 DIAGNOSIS — L03116 Cellulitis of left lower limb: Secondary | ICD-10-CM | POA: Diagnosis not present

## 2021-10-24 DIAGNOSIS — Z853 Personal history of malignant neoplasm of breast: Secondary | ICD-10-CM | POA: Diagnosis not present

## 2021-10-24 DIAGNOSIS — J45909 Unspecified asthma, uncomplicated: Secondary | ICD-10-CM | POA: Insufficient documentation

## 2021-10-24 DIAGNOSIS — N1832 Chronic kidney disease, stage 3b: Secondary | ICD-10-CM | POA: Diagnosis not present

## 2021-10-24 DIAGNOSIS — M7989 Other specified soft tissue disorders: Secondary | ICD-10-CM | POA: Diagnosis present

## 2021-10-24 DIAGNOSIS — M7122 Synovial cyst of popliteal space [Baker], left knee: Secondary | ICD-10-CM | POA: Diagnosis not present

## 2021-10-24 DIAGNOSIS — E039 Hypothyroidism, unspecified: Secondary | ICD-10-CM | POA: Insufficient documentation

## 2021-10-24 DIAGNOSIS — L0390 Cellulitis, unspecified: Secondary | ICD-10-CM

## 2021-10-24 DIAGNOSIS — I129 Hypertensive chronic kidney disease with stage 1 through stage 4 chronic kidney disease, or unspecified chronic kidney disease: Secondary | ICD-10-CM | POA: Diagnosis not present

## 2021-10-24 LAB — CBC WITH DIFFERENTIAL/PLATELET
Abs Immature Granulocytes: 0.04 10*3/uL (ref 0.00–0.07)
Basophils Absolute: 0.1 10*3/uL (ref 0.0–0.1)
Basophils Relative: 1 %
Eosinophils Absolute: 0.1 10*3/uL (ref 0.0–0.5)
Eosinophils Relative: 1 %
HCT: 38.6 % (ref 36.0–46.0)
Hemoglobin: 11.8 g/dL — ABNORMAL LOW (ref 12.0–15.0)
Immature Granulocytes: 1 %
Lymphocytes Relative: 16 %
Lymphs Abs: 1.3 10*3/uL (ref 0.7–4.0)
MCH: 28.2 pg (ref 26.0–34.0)
MCHC: 30.6 g/dL (ref 30.0–36.0)
MCV: 92.3 fL (ref 80.0–100.0)
Monocytes Absolute: 0.8 10*3/uL (ref 0.1–1.0)
Monocytes Relative: 10 %
Neutro Abs: 5.9 10*3/uL (ref 1.7–7.7)
Neutrophils Relative %: 71 %
Platelets: 193 10*3/uL (ref 150–400)
RBC: 4.18 MIL/uL (ref 3.87–5.11)
RDW: 14.2 % (ref 11.5–15.5)
WBC: 8.2 10*3/uL (ref 4.0–10.5)
nRBC: 0 % (ref 0.0–0.2)

## 2021-10-24 LAB — BASIC METABOLIC PANEL
Anion gap: 7 (ref 5–15)
BUN: 26 mg/dL — ABNORMAL HIGH (ref 8–23)
CO2: 24 mmol/L (ref 22–32)
Calcium: 9.3 mg/dL (ref 8.9–10.3)
Chloride: 109 mmol/L (ref 98–111)
Creatinine, Ser: 1.38 mg/dL — ABNORMAL HIGH (ref 0.44–1.00)
GFR, Estimated: 39 mL/min — ABNORMAL LOW (ref >60)
Glucose, Bld: 91 mg/dL (ref 70–99)
Potassium: 3.7 mmol/L (ref 3.5–5.1)
Sodium: 140 mmol/L (ref 135–145)

## 2021-10-24 LAB — LACTIC ACID, PLASMA: Lactic Acid, Venous: 1.1 mmol/L (ref 0.5–1.9)

## 2021-10-24 NOTE — ED Provider Triage Note (Signed)
Emergency Medicine Provider Triage Evaluation Note  NUR RABOLD , a 81 y.o. female  was evaluated in triage.  Pt complains of left lower extremity redness swelling and pain.  Had recent admission to the hospital for cellulitis lower extremity.  No chest pain, shortness of breath.  Review of Systems  Positive: Left leg pain, swelling, redness Negative: Chest pain shortness of breath  Physical Exam  BP (!) 156/84   Pulse 85   Temp 99.2 F (37.3 C)   Resp 16   SpO2 98%  Gen:   Awake, no distress   Resp:  Normal effort  MSK:   Moves extremities without difficulty  Other:    Medical Decision Making  Medically screening exam initiated at 6:46 PM.  Appropriate orders placed.  MARELIN TAT was informed that the remainder of the evaluation will be completed by another provider, this initial triage assessment does not replace that evaluation, and the importance of remaining in the ED until their evaluation is complete.     Duanne Guess, [place] 10/24/21 1848

## 2021-10-24 NOTE — ED Triage Notes (Signed)
Pt to ED from [facility] walk in for recurrently cellulitis. Pt was DC from the hospital on 10/11/21. Pt has taken PO doxy but pt is having increased swelling, pain and redness.

## 2021-10-25 MED ORDER — CLINDAMYCIN HCL 150 MG PO CAPS: 450.0000 mg | ORAL_CAPSULE | Freq: Three times a day (TID) | ORAL | 0 refills | Status: AC

## 2021-10-25 MED ORDER — CLINDAMYCIN HCL 150 MG PO CAPS
450.0000 mg | ORAL_CAPSULE | Freq: Once | ORAL | Status: AC
  Administered 2021-10-25: 450 mg via ORAL
  Filled 2021-10-25: qty 3

## 2021-10-25 NOTE — ED Provider Notes (Signed)
[facility] Provider Note    Event Date/Time   First MD Initiated Contact with Patient 10/24/21 2358     (approximate)   History   Leg Swelling   HPI  Latoya Stevenson is a 81 y.o. female  with medical history significant for anxiety, osteoarthritis, asthma, breast cancer, GERD, on Arimidex, stage IIIb chronic kidney disease, DDD, hypertension comes lipidemia, RLS and hypothyroidism, and recent admission discharged on 7/28 for left lower extremity cellulitis discharged on doxycycline having completed a 5-day course he presents for assessment of recurrence of the pain redness and swelling around the left lower leg from the mid calf to the ankle.  She states it feels similar to how she felt when she was hospitalized.  She has not had any fevers, chills, cough, nausea, vomiting, diarrhea or any other areas of redness or pain in the extremities.  He states he is worried is the same infection of the back.  No other acute concerns at this time      Physical Exam  Triage Vital Signs: ED Triage Vitals  Enc Vitals Group     BP 10/24/21 1840 (!) 156/84     Pulse Rate 10/24/21 1840 85     Resp 10/24/21 1840 16     Temp 10/24/21 1840 99.2 F (37.3 C)     Temp src --      SpO2 10/24/21 1840 98 %     Weight --      Height --      Head Circumference --      Peak Flow --      Pain Score 10/24/21 1832 6     Pain Loc --      Pain Edu? --      Excl. in [place]? --     Most recent vital signs: Vitals:   10/24/21 1840 10/25/21 0027  BP: (!) 156/84 (!) 148/83  Pulse: 85 81  Resp: 16 16  Temp: 99.2 F (37.3 C) 98.7 F (37.1 C)  SpO2: 98% 98%    General: Awake, no distress.  CV:  Good peripheral perfusion.  2+ bilateral radial and PT pulses. Resp:  Normal effort.  Abd:  No distention.  Other:  There is some erythema edema warmth and tenderness around the left lower leg circumferentially from the posterior around the ankle to the mid calf.  Patient is able to move  her toes and plantar and dorsiflex her foot.   ED Results / Procedures / Treatments  Labs (all labs ordered are listed, but only abnormal results are displayed) Labs Reviewed  CBC WITH DIFFERENTIAL/PLATELET - Abnormal; Notable for the following components:      Result Value   Hemoglobin 11.8 (*)    All other components within normal limits  BASIC METABOLIC PANEL - Abnormal; Notable for the following components:   BUN 26 (*)    Creatinine, Ser 1.38 (*)    GFR, Estimated 39 (*)    All other components within normal limits  LACTIC ACID, PLASMA     EKG    RADIOLOGY  Ultrasound left lower extremity interpretation without evidence of DVT.  I reviewed radiology interpretation and agree to findings of same in addition to a left-sided Baker's cyst.   PROCEDURES:  Critical Care performed: No  Procedures   MEDICATIONS ORDERED IN ED: Medications  clindamycin (CLEOCIN) capsule 450 mg (has no administration in time range)     IMPRESSION / MDM / ASSESSMENT AND PLAN / ED COURSE  I reviewed the triage vital signs and the nursing notes. Patient's presentation is most consistent with acute presentation with potential threat to life or bodily function.                               Differential diagnosis includes, but is not limited to recurrent cellulitis versus a DVT.  Patient has no abnormal vitals or other constitutional symptoms or hemodynamic instability to suggest sepsis and overall presentation is not suggestive of necrotizing infection or an acute orthopedic injury  Ultrasound left lower extremity interpretation without evidence of DVT.  I reviewed radiology interpretation and agree to findings of same in addition to a left-sided Baker's cyst.  BMP is remarkable for creatinine of 1.38, to 1.292 weeks ago without any other significant derangements.  CBC without leukocytosis or acute anemia.  Lactic acid is nonelevated  Concerned that this may be recurrent cellulitis and that  she will require additional strep coverage, doxycycline was able to provide earlier.  I discussed with pharmacy and they are in agreement that 40 mg of clindamycin 3 times daily for 7 days would be the best option at this point.  I do not believe patient requires admission but discussed she should return immediately for any new or worsening of symptoms or if she does not improve after 72 hours.  Also advise close outpatient PCP follow-up for wound check early next week.  Discharged in stable condition.  Strict return precautions advised and discussed      FINAL CLINICAL IMPRESSION(S) / ED DIAGNOSES   Final diagnoses:  Recurrent cellulitis     Rx / DC Orders   ED Discharge Orders          Ordered    clindamycin (CLEOCIN) 150 MG capsule  3 times daily        10/25/21 0029             Note:  This document was prepared using Dragon voice recognition software and may include unintentional dictation errors.   Lucrezia Starch, MD 10/25/21 [phone]

## 2021-10-29 ENCOUNTER — Encounter: Payer: Self-pay | Admitting: Internal Medicine

## 2021-10-29 ENCOUNTER — Ambulatory Visit (INDEPENDENT_AMBULATORY_CARE_PROVIDER_SITE_OTHER): Payer: Medicare HMO | Admitting: Internal Medicine

## 2021-10-29 VITALS — BP 122/62 | HR 75 | Ht 63.0 in | Wt 169.0 lb

## 2021-10-29 DIAGNOSIS — F411 Generalized anxiety disorder: Secondary | ICD-10-CM

## 2021-10-29 DIAGNOSIS — L03116 Cellulitis of left lower limb: Secondary | ICD-10-CM

## 2021-10-29 DIAGNOSIS — K219 Gastro-esophageal reflux disease without esophagitis: Secondary | ICD-10-CM | POA: Diagnosis not present

## 2021-10-29 MED ORDER — OMEPRAZOLE 40 MG PO CPDR: 40.0000 mg | DELAYED_RELEASE_CAPSULE | Freq: Every day | ORAL | 1 refills | Status: DC

## 2021-10-29 MED ORDER — CLINDAMYCIN HCL 300 MG PO CAPS: 300.0000 mg | ORAL_CAPSULE | Freq: Three times a day (TID) | ORAL | 0 refills | Status: AC

## 2021-10-29 MED ORDER — OMEPRAZOLE 40 MG PO CPDR: 40.0000 mg | DELAYED_RELEASE_CAPSULE | Freq: Every day | ORAL | 0 refills | Status: DC

## 2021-10-29 MED ORDER — SERTRALINE HCL 50 MG PO TABS: 50.0000 mg | ORAL_TABLET | Freq: Every day | ORAL | 1 refills | Status: DC

## 2021-10-29 NOTE — Progress Notes (Signed)
Date:  10/29/2021   Name:  Latoya Stevenson   DOB:  Sep 06, 1940   MRN:  [id number]   Chief Complaint: Hospitalization Follow-up (Medication is helping with pain, swelling , redness, warm to touch), Gastroesophageal Reflux (Needs a refill - was sent in from GI), and Anxiety (Due to worrying about health ) Hospital follow up. Admitted to [facility] 10/09/21 to 10/11/21 for left leg cellulitis. 81 y.o. Caucasian female with medical history significant for anxiety, osteoarthritis, asthma, breast cancer, GERD, on Arimidex, stage IIIb chronic kidney disease, DDD, hypertension comes lipidemia, RLS and hypothyroidism, who presented to the emergency room with acute onset of worsening left lower extremity pain with associated swelling and erythema since last week worsening over the last couple of days.  She was seen by her PCP at which time it was only edema without erythema.  It started around her ankle and extended to her calf area.  Her pain is extending from below her knee down to her ankle.  2 years ago she had a significant injury when she was mowing her lawn and picked up a piece of wire that became impaled in her leg.  She was taken to the OR for removal of the wire foreign body as well as irrigation but had no fractures.  She admits to mild chills with no reported fever.  Chest pain or dyspnea or cough.  No nausea or vomiting or abdominal pain.  She denies any history of thromboembolism..  No dysuria, oliguria or hematuria or flank pain.  No headache or dizziness or blurred vision.  No new lotions, or detergents or new medications.   Was on intravenous vancomycin from admission.  Cellulitic changes improving.  Patient has a severe penicillin allergy.  Physical exam markedly improved at time of discharge.  Considering penicillin allergy will transition to p.o. doxycycline to complete additional 5-day course for total of 7 days of antibiotics.  Follow-up with PCP within 1 to 2 weeks.  Left tib-fib imaging:   IMPRESSION: 1. No acute fracture. Diffuse subcutaneous fat edema appears similar to prior 12/09/2019 radiographs and may be systemic. 2. Moderate lateral greater than medial knee compartment osteoarthritis. 3. Severe patellofemoral osteoarthritis.  Venous US Left: IMPRESSION: 1. No deep venous thrombosis left lower extremity with limited evaluation of the calf veins. 2. A 8.4 x 2.3 x 4.6 cm fluid density lesion within the left popliteal fossa. Finding could represent a Baker cyst versus liquified hematoma.  Interim History - she returned to the ED on 10/24/21. Summary:  No DVT. Concerned that this may be recurrent cellulitis and that she will require additional strep coverage, doxycycline was able to provide earlier.  I discussed with pharmacy and they are in agreement that 40 mg of clindamycin 3 times daily for 7 days would be the best option at this point.  I do not believe patient requires admission but discussed she should return immediately for any new or worsening of symptoms or if she does not improve after 72 hours.  Also advise close outpatient PCP follow-up for wound check early next week.  Discharged in stable condition.  Strict return precautions advised and discussed Anxiety Presents for initial visit. Onset was 1 to 4 weeks ago. The problem has been waxing and waning. Symptoms include irritability and nervous/anxious behavior. Patient reports no chest pain, dizziness or shortness of breath. Symptoms occur most days. The severity of symptoms is interfering with daily activities.    Gastroesophageal Reflux She complains of heartburn. She reports no  chest pain. This is a recurrent problem. Pertinent negatives include no fatigue. She has tried a PPI (ran out of 40 mg omeprazole) for the symptoms.     Lab Results  Component Value Date   NA 140 10/24/2021   K 3.7 10/24/2021   CO2 24 10/24/2021   GLUCOSE 91 10/24/2021   BUN 26 (H) 10/24/2021   CREATININE 1.38 (H) 10/24/2021   CALCIUM  9.3 10/24/2021   EGFR 43 (L) 10/07/2021   GFRNONAA 39 (L) 10/24/2021   Lab Results  Component Value Date   CHOL 154 10/07/2021   HDL 55 10/07/2021   LDLCALC 85 10/07/2021   TRIG 70 10/07/2021   CHOLHDL 2.8 10/07/2021   Lab Results  Component Value Date   TSH 0.037 (L) 10/07/2021   No results found for: "HGBA1C" Lab Results  Component Value Date   WBC 8.2 10/24/2021   HGB 11.8 (L) 10/24/2021   HCT 38.6 10/24/2021   MCV 92.3 10/24/2021   PLT 193 10/24/2021   Lab Results  Component Value Date   ALT 16 10/07/2021   AST 17 10/07/2021   ALKPHOS 76 10/07/2021   BILITOT 0.7 10/07/2021   Lab Results  Component Value Date   VD25OH 32.16 01/21/2021     Review of Systems  Constitutional:  Positive for irritability. Negative for chills, fatigue and fever.  Respiratory:  Negative for chest tightness and shortness of breath.   Cardiovascular:  Positive for leg swelling. Negative for chest pain.  Gastrointestinal:  Positive for heartburn.  Skin:  Positive for color change.  Neurological:  Negative for dizziness and headaches.  Psychiatric/Behavioral:  The patient is nervous/anxious.     Patient Active Problem List   Diagnosis Date Noted   Left leg cellulitis 10/09/2021   Dyslipidemia 10/09/2021   Essential hypertension 10/09/2021   History of breast cancer 10/09/2021   Lumbar radiculopathy 10/09/2021   Malignant neoplasm of 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 ([place]) 08/04/2019   Esophageal stenosis 01/28/2018   Varicose veins of left leg with edema 07/29/2017   Dysphagia, oropharyngeal 04/30/2017   Lumbar degenerative disc disease 04/30/2017   Spinal stenosis of lumbar region 12/08/2016   Idiopathic scoliosis of lumbar region 09/09/2016   Chronic midline low back pain with right-sided sciatica 07/28/2016   GERD without esophagitis 07/27/2015   Intermittent vertigo 07/27/2015   Osteoporosis, senile 07/27/2015   Seasonal allergic rhinitis due to pollen 07/27/2015    Acquired hypothyroidism 01/05/2015   Benign essential hypertension 06/29/2014   Mixed hyperlipidemia 06/29/2014   Stage 3b chronic kidney disease ([place]) 06/29/2014   Insomnia 11/11/2013   Mild reactive airways disease 11/11/2013    Allergies  Allergen Reactions   Flagyl [Metronidazole Hcl] Swelling    Swelling in face, mouth, throat, palms of feet and hands.   Furosemide Itching    Made side of mouth irritated and puffy.  The rim inside her lip swelled up   Macrodantin [Nitrofurantoin Macrocrystal] Anaphylaxis, Hives and Swelling   Penicillins Anaphylaxis and Swelling    PATIENT HAD A PCN REACTION WITH IMMEDIATE RASH, FACIAL/TONGUE/THROAT SWELLING, SOB, OR LIGHTHEADEDNESS WITH HYPOTENSION:  #  #  #  YES  #  #  #   Has patient had a PCN reaction causing severe rash involving mucus membranes or skin necrosis: No Has patient had a PCN reaction that required hospitalization: No Has patient had a PCN reaction occurring within the last 10 years: No If all of the above answers are "NO", then may proceed with  Cephalosporin use.    Celebrex [Celecoxib] Swelling    Swelling in joints.  Did not agree with her. Did not help her either   Other     Pt says it was recently discovered she allergic to an ingredient in local anesthesia but she can't recall the name. (epinephrine and the additive used for the biopsy. Gets very nervous. Feels internally shaking. 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 Tito Dine FRACTURE SURGERY Left 2003   arch of foot.  no metal. per patient, she did not have surgery for this fracture. healed on its own   ANTERIOR LAT LUMBAR FUSION Left 09/09/2016   Procedure: Left Lumbar two-three Anterolateral lumbar interbody fusion with lateral plate;  Surgeon: Erline Levine, MD;  Location: [facility];  Service: Neurosurgery;  Laterality: Left;  Left L2-3  Anterolateral lumbar interbody fusion with lateral plate   APPENDECTOMY     BACK SURGERY  2013, 2016, 2018   lumbar lam, fusion, discectomy.titanium   BIOPSY THYROID     x2  thyroid nodule ; Dr. Sharlet Salina and one by Dr. Sabra Heck   BREAST BIOPSY Left 08/02/2019   coil clip-INVASIVE MAMMARY CARCINOMA   BREAST CYST ASPIRATION  1970's   BREAST LUMPECTOMY Left 2021   BREAST LUMPECTOMY WITH RADIOFREQUENCY TAG IDENTIFICATION Left 0/53/9767   Procedure: BREAST LUMPECTOMY WITH RADIOFREQUENCY TAG IDENTIFICATION;  Surgeon: Ronny Bacon, MD;  Location: [facility];  Service: General;  Laterality: Left;   BREAST LUMPECTOMY WITH SENTINEL LYMPH NODE BIOPSY Left 08/12/2019   Procedure: BREAST LUMPECTOMY WITH SENTINEL LYMPH NODE BX;  Surgeon: Ronny Bacon, MD;  Location: [facility];  Service: General;  Laterality: Left;   broken arm Left 1994   wrist fracture surgery, left.  metal has been replaced.   CARDIOVASCULAR STRESS TEST     2010   COLONOSCOPY  2010   COLONOSCOPY  07/21/2013   PHX OF CP/REPEAT 46YRS/[place]   ESOPHAGOGASTRODUODENOSCOPY (EGD) WITH PROPOFOL N/A 09/22/2018   Procedure: ESOPHAGOGASTRODUODENOSCOPY (EGD) WITH PROPOFOL;  Surgeon: Toledo, Benay Pike, MD;  Location: [facility];  Service: Gastroenterology;  Laterality: N/A;  Patient to have Rapid COVID-19 day of procedure Kieth Brightly approved this)   EYE SURGERY Bilateral    cataract surgery with lens implant   FOREIGN BODY REMOVAL N/A 12/09/2019   Procedure: FOREIGN BODY REMOVAL ADULT;  Surgeon: Hessie Knows, MD;  Location: [facility];  Service: Orthopedics;  Laterality: N/A;   INCISION AND DRAINAGE Left 12/09/2019   Procedure: INCISION AND DRAINAGE;  Surgeon: Hessie Knows, MD;  Location: [facility];  Service: Orthopedics;  Laterality: Left;   LACERATION REPAIR Bilateral 12/09/2019   Procedure: REPAIR MULTIPLE LACERATIONS;  Surgeon: Hessie Knows, MD;  Location: [facility];  Service: Orthopedics;  Laterality: Bilateral;   LUMBAR LAMINECTOMY/DECOMPRESSION  MICRODISCECTOMY  11/18/2011   Procedure: LUMBAR LAMINECTOMY/DECOMPRESSION MICRODISCECTOMY 2 LEVELS;  Surgeon: Erline Levine, MD;  Location: [facility];  Service: Neurosurgery;  Laterality: N/A;  Lumbar Three-Four,  Lumbar Four-Five Decompressive Laminectomy   MALONEY DILATION N/A 09/22/2018   Procedure: MALONEY DILATION;  Surgeon: Toledo, Benay Pike, MD;  Location: [facility];  Service: Gastroenterology;  Laterality: N/A;   OVARIAN CYST SURGERY     POSTERIOR FUSION LUMBAR SPINE  2016   TOTAL THYROIDECTOMY     TRANSTHORACIC ECHOCARDIOGRAM  2010   upper and lower GI  2010    Social History  Tobacco Use   Smoking status: Never   Smokeless tobacco: Never  Vaping Use   Vaping Use: Never used  Substance Use Topics   Alcohol use: Not Currently    Comment: socially-rare   Drug use: No     Medication list has been reviewed and updated.  Current Meds  Medication Sig   acetaminophen (TYLENOL) 650 MG CR tablet Take 1,300 mg by mouth every 8 (eight) hours as needed for pain.   amLODipine (NORVASC) 2.5 MG tablet Take 2.5 mg by mouth daily.   anastrozole (ARIMIDEX) 1 MG tablet TAKE 1 TABLET EVERY DAY   atorvastatin (LIPITOR) 40 MG tablet Take 1 tablet (40 mg total) by mouth at bedtime.   Calcium Carbonate-Vitamin D 600-400 MG-UNIT tablet Take 2 tablets by mouth daily.   chlorthalidone (HYGROTON) 25 MG tablet Take 25 mg by mouth in the morning and at bedtime.    cholecalciferol (VITAMIN D3) 25 MCG (1000 UNIT) tablet Take 1,000 Units by mouth daily.   clindamycin (CLEOCIN) 150 MG capsule Take 3 capsules (450 mg total) by mouth 3 (three) times daily for 7 days.   denosumab (PROLIA) 60 MG/ML SOSY injection Inject 60 mg into the skin every 6 (six) months.   diclofenac Sodium (VOLTAREN) 1 % GEL Apply 2 g topically 4 (four) times daily.   fexofenadine (ALLEGRA) 180 MG tablet Take 180 mg by mouth daily.   fluticasone (FLONASE) 50 MCG/ACT nasal spray Place 2 sprays into both nostrils daily as needed  for allergies.   gabapentin (NEURONTIN) 300 MG capsule Take 300 mg by mouth at bedtime.   GLUCOSAMINE-CHONDROITIN PO Take 1 tablet by mouth daily.    irbesartan (AVAPRO) 300 MG tablet Take 1 tablet (300 mg total) by mouth daily.   levothyroxine (SYNTHROID) 150 MCG tablet Take 1 tablet (150 mcg total) by mouth daily before breakfast.   Multiple Vitamin (MULTIVITAMIN WITH MINERALS) TABS Take 1 tablet by mouth daily.   omeprazole (PRILOSEC) 40 MG capsule Take 40 mg by mouth daily before breakfast.   vitamin E 180 MG (400 UNITS) capsule Take 400 Units by mouth daily.   [DISCONTINUED] tobramycin-dexamethasone (TOBRADEX) ophthalmic solution Place 2 drops into both eyes every 6 (six) hours.       10/29/2021    2:11 PM 10/07/2021   10:53 AM 08/23/2021    9:08 AM 06/04/2021    2:51 PM  GAD 7 : Generalized Anxiety Score  Nervous, Anxious, on Edge 2 0 1 0  Control/stop worrying 1 0 0 0  Worry too much - different things 1 0 0 0  Trouble relaxing 2 0 0 0  Restless 2 0 0 0  Easily annoyed or irritable 3 0 0 0  Afraid - awful might happen 0 0 0 0  Total GAD 7 Score 11 0 1 0  Anxiety Difficulty Somewhat difficult Not difficult at all Not difficult at all Not difficult at all       10/29/2021    2:11 PM 10/07/2021   10:53 AM 08/23/2021    9:08 AM  Depression screen PHQ 2/9  Decreased Interest 0 0 0  Down, Depressed, Hopeless 1 0 0  PHQ - 2 Score 1 0 0  Altered sleeping _0 Tired, decreased energy 2 0 0  Change in appetite 2 0 0  Feeling bad or failure about yourself  0 0 0  Trouble concentrating 0 0 0  Moving slowly or fidgety/restless 0 0 0  Suicidal thoughts 0 0 0  PHQ-9 Score _0 Difficult doing work/chores Somewhat difficult  Not difficult at all    BP Readings from Last 3 Encounters:  10/29/21 122/62  10/25/21 (!) 148/83  10/11/21 113/66    Physical Exam Vitals and nursing note reviewed.  Constitutional:      General: She is not in acute distress.    Appearance: She is  well-developed.  HENT:     Head: Normocephalic and atraumatic.  Cardiovascular:     Rate and Rhythm: Normal rate and regular rhythm.  Pulmonary:     Effort: Pulmonary effort is normal. No respiratory distress.     Breath sounds: No wheezing or rhonchi.  Musculoskeletal:     Cervical back: Normal range of motion.  Skin:    General: Skin is warm and dry.     Findings: No rash.          Comments: Mild erythema of the anterior 2/3 of the left lower leg No drainage or fluctuance Minimally warm/tender  Neurological:     Mental Status: She is alert and oriented to person, place, and time.  Psychiatric:        Mood and Affect: Mood normal.        Behavior: Behavior normal.     Wt Readings from Last 3 Encounters:  10/29/21 169 lb (76.7 kg)  10/09/21 171 lb (77.6 kg)  10/07/21 172 lb 3.2 oz (78.1 kg)    BP 122/62   Pulse 75   Ht _1  (1.6 m)   Wt 169 lb (76.7 kg)   SpO2 99%   BMI 29.94 kg/m   Assessment and Plan: 1. Left leg cellulitis Per patient and friend - improved from last week Continue activity as tolerated with leg elevation as needed Follow up in 2 weeks Consider VS referral at that time. Finish 450 mg tid Rx then 300 mg tid x 10 days - clindamycin (CLEOCIN) 300 MG capsule; Take 1 capsule (300 mg total) by mouth 3 (three) times daily for 10 days.  Dispense: 30 capsule; Refill: 0  2. Generalized anxiety disorder Begin Sertraline Follow up next visit - sertraline (ZOLOFT) 50 MG tablet; Take 1 tablet (50 mg total) by mouth daily.  Dispense: 30 tablet; Refill: 1  3. Gastroesophageal reflux disease without esophagitis Continue PPI - omeprazole (PRILOSEC) 40 MG capsule; Take 1 capsule (40 mg total) by mouth daily before breakfast.  Dispense: 90 capsule; Refill: 1 - omeprazole (PRILOSEC) 40 MG capsule; Take 1 capsule (40 mg total) by mouth daily.  Dispense: 30 capsule; Refill: 0   Partially dictated using Editor, commissioning. Any errors are unintentional.  [name], MD [facility]  10/29/2021

## 2021-11-01 ENCOUNTER — Telehealth: Payer: Self-pay | Admitting: Internal Medicine

## 2021-11-01 NOTE — Telephone Encounter (Signed)
Copied from [place] [phone]. Topic: Medicare AWV >> Nov 01, 2021  2:20 PM Jae Dire wrote: Reason for CRM:  Left message for patient to call back and schedule Medicare Annual Wellness Visit (AWV) in office.   If unable to come into the office for AWV,  please offer to do virtually or by telephone.  Last AWV: 07/16/2018 awvs per [facility]  Please schedule at anytime with [facility] Health Advisor.      30 minute appointment for Virtual or phone 45 minute appointment for in office or Initial virtual/phone  Any questions, please call me at [phone]

## 2021-11-04 ENCOUNTER — Ambulatory Visit (INDEPENDENT_AMBULATORY_CARE_PROVIDER_SITE_OTHER): Payer: Medicare HMO | Admitting: Internal Medicine

## 2021-11-04 ENCOUNTER — Ambulatory Visit: Payer: Self-pay | Admitting: *Deleted

## 2021-11-04 ENCOUNTER — Encounter: Payer: Self-pay | Admitting: Internal Medicine

## 2021-11-04 VITALS — BP 130/70 | HR 82 | Ht 63.0 in | Wt 170.2 lb

## 2021-11-04 DIAGNOSIS — F411 Generalized anxiety disorder: Secondary | ICD-10-CM

## 2021-11-04 DIAGNOSIS — L03116 Cellulitis of left lower limb: Secondary | ICD-10-CM

## 2021-11-04 NOTE — Telephone Encounter (Signed)
Reason for Disposition  Sounds like a recheck is needed  to the triager  Answer Assessment - Initial Assessment Questions 1. SYMPTOM: "What's the main symptom you're concerned about?" (e.g., redness, swelling, pain, fever, weakness)     Open draining wound- cellulitis- Friday am 2. WOUND INFECTION LOCATION: "Where is the wound infection located?" (e.g., arm, face, foot, knee, leg)     Front of left leg- draining   3. WOUND INFECTION SIZE: "What is the size of the red area?" (e.g., inches, centimeters; compare to size of a coin)      Small-less than dime size 4. BETTER-SAME-WORSE: "Are you getting better, staying the same, or getting worse compared to the day you started the antibiotics?"      Better- seems to be closing now 5. PAIN: "Do you have any pain?"  If Yes, ask: "How bad is the pain?"  (e.g., Scale 1-10; mild, moderate, or severe)    - MILD (1-3): Doesn't interfere with normal activities.     - MODERATE (4-7): Interferes with normal activities or awakens from sleep.    - SEVERE (8-10): Excruciating pain, unable to do any normal activities.       No pain 6. FEVER: "Do you have a fever?" If Yes, ask: "What is it, how was it measured and when did it start?"     no 7. OTHER SYMPTOMS: "Do you have any other symptoms?" (e.g., pus coming from a wound, red streaks, weakness)     Friday- pus drainage- has cleared to clear/yellow in color 8. DIAGNOSIS DATE: "When was the wound infection diagnosed?" "By whom?"      Last Tuesday 9. ANTIBIOTIC NAME: "What antibiotic(s) are you taking?"  "How many times a day?" Note: Be sure the patient is taking the antibiotic as directed.      Clindamycin 300 mg daily 10. ANTIBIOTIC DATE: "When was the antibiotic started?"       Friday night was on antibiotic 3 times/day- then switched to daily 11. FOLLOW-UP APPOINTMENT: "Do you have a follow-up appointment with your doctor?"       8/29  Protocols used: Wound Infection on Antibiotic Follow-up Call-A-AH,  Cellulitis on Antibiotic Follow-up Call-A-AH

## 2021-11-04 NOTE — Telephone Encounter (Signed)
  Chief Complaint: wound open Symptoms: draining- is better Frequency: opened Friday Pertinent Negatives: Patient denies fever Disposition: '[]'$ ED /'[]'$ Urgent Care (no appt availability in office) / '[x]'$ Appointment(In office/virtual)/ '[]'$  [facility]/ '[]'$ Home Care/ '[]'$ Refused Recommended Disposition /'[]'$ [facility]/ '[]'$  Follow-up with PCP Additional Notes:

## 2021-11-04 NOTE — Patient Instructions (Signed)
Cut Zoloft in half and take only 25 mg daily.  Increase the Clindamycin to three times a day until Rx is complete.

## 2021-11-04 NOTE — Progress Notes (Signed)
Date:  11/04/2021   Name:  Latoya Stevenson   DOB:  March 20, 1940   MRN:  [id number]   Chief Complaint: Recurrent Skin Infections (Patient says she now has a open wound and wants leg checked. )  Leg Pain  There was no injury mechanism. The pain is present in the left leg. The patient is experiencing no pain. Treatments tried: antibiotics but only taking one a day rather three a day.  Anxiety Presents for follow-up visit. Symptoms include excessive worry and irritability. Patient reports no chest pain, nervous/anxious behavior or shortness of breath. Symptoms occur occasionally. The quality of sleep is good.   Compliance with medications: started Zoloft 50 mg but may be too strong - making her sleepy but helping the other symptoms.    Lab Results  Component Value Date   NA 140 10/24/2021   K 3.7 10/24/2021   CO2 24 10/24/2021   GLUCOSE 91 10/24/2021   BUN 26 (H) 10/24/2021   CREATININE 1.38 (H) 10/24/2021   CALCIUM 9.3 10/24/2021   EGFR 43 (L) 10/07/2021   GFRNONAA 39 (L) 10/24/2021   Lab Results  Component Value Date   CHOL 154 10/07/2021   HDL 55 10/07/2021   LDLCALC 85 10/07/2021   TRIG 70 10/07/2021   CHOLHDL 2.8 10/07/2021   Lab Results  Component Value Date   TSH 0.037 (L) 10/07/2021   No results found for: "HGBA1C" Lab Results  Component Value Date   WBC 8.2 10/24/2021   HGB 11.8 (L) 10/24/2021   HCT 38.6 10/24/2021   MCV 92.3 10/24/2021   PLT 193 10/24/2021   Lab Results  Component Value Date   ALT 16 10/07/2021   AST 17 10/07/2021   ALKPHOS 76 10/07/2021   BILITOT 0.7 10/07/2021   Lab Results  Component Value Date   VD25OH 32.16 01/21/2021     Review of Systems  Constitutional:  Positive for irritability. Negative for appetite change, fatigue and fever.  Respiratory:  Negative for chest tightness and shortness of breath.   Cardiovascular:  Positive for leg swelling. Negative for chest pain.  Skin:  Positive for wound.  Psychiatric/Behavioral:   Negative for dysphoric mood and sleep disturbance. The patient is not nervous/anxious.     Patient Active Problem List   Diagnosis Date Noted   Left leg cellulitis 10/09/2021   Dyslipidemia 10/09/2021   Essential hypertension 10/09/2021   History of breast cancer 10/09/2021   Lumbar radiculopathy 10/09/2021   Malignant neoplasm of 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 ([place]) 08/04/2019   Esophageal stenosis 01/28/2018   Varicose veins of left leg with edema 07/29/2017   Dysphagia, oropharyngeal 04/30/2017   Lumbar degenerative disc disease 04/30/2017   Spinal stenosis of lumbar region 12/08/2016   Idiopathic scoliosis of lumbar region 09/09/2016   Chronic midline low back pain with right-sided sciatica 07/28/2016   GERD without esophagitis 07/27/2015   Intermittent vertigo 07/27/2015   Osteoporosis, senile 07/27/2015   Seasonal allergic rhinitis due to pollen 07/27/2015   Acquired hypothyroidism 01/05/2015   Benign essential hypertension 06/29/2014   Mixed hyperlipidemia 06/29/2014   Stage 3b chronic kidney disease ([place]) 06/29/2014   Insomnia 11/11/2013   Mild reactive airways disease 11/11/2013    Allergies  Allergen Reactions   Flagyl [Metronidazole Hcl] Swelling    Swelling in face, mouth, throat, palms of feet and hands.   Furosemide Itching    Made side of mouth irritated and puffy.  The rim inside her lip swelled up  Macrodantin [Nitrofurantoin Macrocrystal] Anaphylaxis, Hives and Swelling   Penicillins Anaphylaxis and Swelling    PATIENT HAD A PCN REACTION WITH IMMEDIATE RASH, FACIAL/TONGUE/THROAT SWELLING, SOB, OR LIGHTHEADEDNESS WITH HYPOTENSION:  #  #  #  YES  #  #  #   Has patient had a PCN reaction causing severe rash involving mucus membranes or skin necrosis: No Has patient had a PCN reaction that required hospitalization: No Has patient had a PCN reaction occurring within the last 10 years: No If all of the above answers are "NO", then may proceed with  Cephalosporin use.    Celebrex [Celecoxib] Swelling    Swelling in joints.  Did not agree with her. Did not help her either   Other     Pt says it was recently discovered she allergic to an ingredient in local anesthesia but she can't recall the name. (epinephrine and the additive used for the biopsy. Gets very nervous. Feels internally shaking. 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 Tito Dine FRACTURE SURGERY Left 2003   arch of foot.  no metal. per patient, she did not have surgery for this fracture. healed on its own   ANTERIOR LAT LUMBAR FUSION Left 09/09/2016   Procedure: Left Lumbar two-three Anterolateral lumbar interbody fusion with lateral plate;  Surgeon: Erline Levine, MD;  Location: [facility];  Service: Neurosurgery;  Laterality: Left;  Left L2-3 Anterolateral lumbar interbody fusion with lateral plate   APPENDECTOMY     BACK SURGERY  2013, 2016, 2018   lumbar lam, fusion, discectomy.titanium   BIOPSY THYROID     x2  thyroid nodule ; Dr. Sharlet Salina and one by Dr. Sabra Heck   BREAST BIOPSY Left 08/02/2019   coil clip-INVASIVE MAMMARY CARCINOMA   BREAST CYST ASPIRATION  1970's   BREAST LUMPECTOMY Left 2021   BREAST LUMPECTOMY WITH RADIOFREQUENCY TAG IDENTIFICATION Left 9/38/1017   Procedure: BREAST LUMPECTOMY WITH RADIOFREQUENCY TAG IDENTIFICATION;  Surgeon: Ronny Bacon, MD;  Location: [facility];  Service: General;  Laterality: Left;   BREAST LUMPECTOMY WITH SENTINEL LYMPH NODE BIOPSY Left 08/12/2019   Procedure: BREAST LUMPECTOMY WITH SENTINEL LYMPH NODE BX;  Surgeon: Ronny Bacon, MD;  Location: [facility];  Service: General;  Laterality: Left;   broken arm Left 1994   wrist fracture surgery, left.  metal has been replaced.   CARDIOVASCULAR STRESS TEST     2010   COLONOSCOPY  2010   COLONOSCOPY  07/21/2013   PHX OF CP/REPEAT 71YRS/[place]    ESOPHAGOGASTRODUODENOSCOPY (EGD) WITH PROPOFOL N/A 09/22/2018   Procedure: ESOPHAGOGASTRODUODENOSCOPY (EGD) WITH PROPOFOL;  Surgeon: Toledo, Benay Pike, MD;  Location: [facility];  Service: Gastroenterology;  Laterality: N/A;  Patient to have Rapid COVID-19 day of procedure Kieth Brightly approved this)   EYE SURGERY Bilateral    cataract surgery with lens implant   FOREIGN BODY REMOVAL N/A 12/09/2019   Procedure: FOREIGN BODY REMOVAL ADULT;  Surgeon: Hessie Knows, MD;  Location: [facility];  Service: Orthopedics;  Laterality: N/A;   INCISION AND DRAINAGE Left 12/09/2019   Procedure: INCISION AND DRAINAGE;  Surgeon: Hessie Knows, MD;  Location: [facility];  Service: Orthopedics;  Laterality: Left;   LACERATION REPAIR Bilateral 12/09/2019   Procedure: REPAIR MULTIPLE LACERATIONS;  Surgeon: Hessie Knows, MD;  Location: [facility];  Service: Orthopedics;  Laterality: Bilateral;   LUMBAR LAMINECTOMY/DECOMPRESSION MICRODISCECTOMY  11/18/2011  Procedure: LUMBAR LAMINECTOMY/DECOMPRESSION MICRODISCECTOMY 2 LEVELS;  Surgeon: Erline Levine, MD;  Location: [facility];  Service: Neurosurgery;  Laterality: N/A;  Lumbar Three-Four,  Lumbar Four-Five Decompressive Laminectomy   MALONEY DILATION N/A 09/22/2018   Procedure: MALONEY DILATION;  Surgeon: Toledo, Benay Pike, MD;  Location: [facility];  Service: Gastroenterology;  Laterality: N/A;   OVARIAN CYST SURGERY     POSTERIOR FUSION LUMBAR SPINE  2016   TOTAL THYROIDECTOMY     TRANSTHORACIC ECHOCARDIOGRAM  2010   upper and lower GI  2010    Social History   Tobacco Use   Smoking status: Never   Smokeless tobacco: Never  Vaping Use   Vaping Use: Never used  Substance Use Topics   Alcohol use: Not Currently    Comment: socially-rare   Drug use: No     Medication list has been reviewed and updated.  Current Meds  Medication Sig   acetaminophen (TYLENOL) 650 MG CR tablet Take 1,300 mg by mouth every 8 (eight) hours as needed for pain.   amLODipine  (NORVASC) 2.5 MG tablet Take 2.5 mg by mouth daily.   anastrozole (ARIMIDEX) 1 MG tablet TAKE 1 TABLET EVERY DAY   atorvastatin (LIPITOR) 40 MG tablet Take 1 tablet (40 mg total) by mouth at bedtime.   Calcium Carbonate-Vitamin D 600-400 MG-UNIT tablet Take 2 tablets by mouth daily.   chlorthalidone (HYGROTON) 25 MG tablet Take 25 mg by mouth in the morning and at bedtime.    cholecalciferol (VITAMIN D3) 25 MCG (1000 UNIT) tablet Take 1,000 Units by mouth daily.   clindamycin (CLEOCIN) 300 MG capsule Take 1 capsule (300 mg total) by mouth 3 (three) times daily for 10 days.   denosumab (PROLIA) 60 MG/ML SOSY injection Inject 60 mg into the skin every 6 (six) months.   diclofenac Sodium (VOLTAREN) 1 % GEL Apply 2 g topically 4 (four) times daily.   fexofenadine (ALLEGRA) 180 MG tablet Take 180 mg by mouth daily.   fluticasone (FLONASE) 50 MCG/ACT nasal spray Place 2 sprays into both nostrils daily as needed for allergies.   gabapentin (NEURONTIN) 300 MG capsule Take 300 mg by mouth at bedtime.   GLUCOSAMINE-CHONDROITIN PO Take 1 tablet by mouth daily.    irbesartan (AVAPRO) 300 MG tablet Take 1 tablet (300 mg total) by mouth daily.   levothyroxine (SYNTHROID) 150 MCG tablet Take 1 tablet (150 mcg total) by mouth daily before breakfast.   Multiple Vitamin (MULTIVITAMIN WITH MINERALS) TABS Take 1 tablet by mouth daily.   omeprazole (PRILOSEC) 40 MG capsule Take 1 capsule (40 mg total) by mouth daily before breakfast.   omeprazole (PRILOSEC) 40 MG capsule Take 1 capsule (40 mg total) by mouth daily.   sertraline (ZOLOFT) 50 MG tablet Take 1 tablet (50 mg total) by mouth daily.   vitamin E 180 MG (400 UNITS) capsule Take 400 Units by mouth daily.       11/04/2021   10:21 AM 10/29/2021    2:11 PM 10/07/2021   10:53 AM 08/23/2021    9:08 AM  GAD 7 : Generalized Anxiety Score  Nervous, Anxious, on Edge 3 2 0 1  Control/stop worrying 0 1 0 0  Worry too much - different things 3 1 0 0  Trouble  relaxing 3 2 0 0  Restless 0 2 0 0  Easily annoyed or irritable 0 3 0 0  Afraid - awful might happen 0 0 0 0  Total GAD 7 Score 9 11 0 1  Anxiety Difficulty Extremely difficult Somewhat difficult Not difficult at all Not difficult at all       11/04/2021   10:21 AM 10/29/2021    2:11 PM 10/07/2021   10:53 AM  Depression screen PHQ 2/9  Decreased Interest 0 0 0  Down, Depressed, Hopeless 0 1 0  PHQ - 2 Score 0 1 0  Altered sleeping 0 1 2  Tired, decreased energy 3 2 0  Change in appetite 3 2 0  Feeling bad or failure about yourself  0 0 0  Trouble concentrating 2 0 0  Moving slowly or fidgety/restless 0 0 0  Suicidal thoughts 0 0 0  PHQ-9 Score 8 6 2   Difficult doing work/chores Not difficult at all Somewhat difficult     BP Readings from Last 3 Encounters:  11/04/21 130/70  10/29/21 122/62  10/25/21 (!) 148/83    Physical Exam Vitals and nursing note reviewed.  Constitutional:      General: She is not in acute distress.    Appearance: She is well-developed.  HENT:     Head: Normocephalic and atraumatic.  Pulmonary:     Effort: Pulmonary effort is normal. No respiratory distress.  Skin:    General: Skin is warm and dry.     Findings: Lesion present. No rash.          Comments: 0.5 cm raised papule with scant serous drainage Surrounding skin improved - increased pigmentation but no erythema  Neurological:     Mental Status: She is alert and oriented to person, place, and time.  Psychiatric:        Mood and Affect: Mood normal.        Behavior: Behavior normal.      Wt Readings from Last 3 Encounters:  11/04/21 170 lb 3.2 oz (77.2 kg)  10/29/21 169 lb (76.7 kg)  10/09/21 171 lb (77.6 kg)    BP 130/70   Pulse 82   Ht 5' 3"  (1.6 m)   Wt 170 lb 3.2 oz (77.2 kg)   SpO2 97%   BMI 30.15 kg/m   Assessment and Plan: 1. Left leg cellulitis With small opening - no purulent drainage Increase Clindamycin to tid - call if not tolerated Local care - wash with  warm water only, no alcohol and apply TAO  2. Generalized anxiety disorder Responding well to Zoloft but having somnolence Recommend cutting back to 25 mg daily until next visit   Partially dictated using Editor, commissioning. Any errors are unintentional.  [name], MD [facility]  11/04/2021

## 2021-11-06 DIAGNOSIS — M5441 Lumbago with sciatica, right side: Secondary | ICD-10-CM | POA: Diagnosis not present

## 2021-11-06 DIAGNOSIS — M5136 Other intervertebral disc degeneration, lumbar region: Secondary | ICD-10-CM | POA: Diagnosis not present

## 2021-11-06 DIAGNOSIS — M159 Polyosteoarthritis, unspecified: Secondary | ICD-10-CM | POA: Diagnosis not present

## 2021-11-06 DIAGNOSIS — G8929 Other chronic pain: Secondary | ICD-10-CM | POA: Diagnosis not present

## 2021-11-11 ENCOUNTER — Ambulatory Visit: Payer: Self-pay | Admitting: *Deleted

## 2021-11-11 NOTE — Telephone Encounter (Signed)
  Chief Complaint: Leg wound Symptoms: Pt with wound left leg, cellulitis. Saw Dr. Army Melia 11/04/21. States has 10 tabs of ATBs left (3 days after tonights dose) States no worsening of wound in pain, swelling, redness, or drainage, "Just wondering how long we can expect this drainage to go on." Reports drainage the same as when at OV, no changes. Pt has appt 12/02/21 "Wonder if I shuld be seen earlier. I don't want this to go to bone and lose a leg."  Frequency: OV 11/04/21 Pertinent Negatives: Patient denies fever, any changes in wound appearance or drainage.  Disposition: '[]'$ ED /'[]'$ Urgent Care (no appt availability in office) / '[]'$ Appointment(In office/virtual)/ '[]'$  [facility]/ '[]'$ Home Care/ '[]'$ Refused Recommended Disposition /'[]'$ [facility]/ '[x]'$  Follow-up with PCP Additional Notes: Please advise pt, anxious regarding healing process Reason for Disposition  [1] Taking antibiotic > 72 hours (3 days) AND [2] infected wound not improved (i.e., pain, pus, redness)  Answer Assessment - Initial Assessment Questions 1. LOCATION: "Where is the wound located?"      Left leg 2. WOUND APPEARANCE: "What does the wound look like?"      Same. 3. SIZE: If redness is present, ask: "What is the size of the red area?" (Inches, centimeters, or compare to size of a coin)       4. SPREAD: "What's changed in the last day?"  "Do you see any red streaks coming from the wound?"      5. ONSET: "When did it start to look infected?"       6. MECHANISM: "How did the wound start, what was the cause?"     Cellulitis. 7. PAIN: "Is there any pain?" If Yes, ask: "How bad is the pain?"   (Scale 1-10; or mild, moderate, severe)     No worsening 8. FEVER: "Do you have a fever?" If Yes, ask: "What is your temperature, how was it measured, and when did it start?"     No 9. OTHER SYMPTOMS: "Do you have any other symptoms?" (e.g., shaking chills, weakness, rash elsewhere on body)     Still draining,  "Hole very  small."  Drainage continued, clear bloody, at times little white, dries yellow.No worsening  Protocols used: Wound Infection-A-AH

## 2021-11-11 NOTE — Telephone Encounter (Signed)
Please review.  KP

## 2021-11-12 ENCOUNTER — Ambulatory Visit: Payer: Medicare HMO | Admitting: Internal Medicine

## 2021-11-12 NOTE — Telephone Encounter (Signed)
Called pt scheduled appt for 9/1.  KP

## 2021-11-15 ENCOUNTER — Ambulatory Visit (INDEPENDENT_AMBULATORY_CARE_PROVIDER_SITE_OTHER): Payer: Medicare HMO | Admitting: Internal Medicine

## 2021-11-15 ENCOUNTER — Encounter: Payer: Self-pay | Admitting: Internal Medicine

## 2021-11-15 VITALS — BP 116/66 | HR 73 | Ht 63.0 in | Wt 171.6 lb

## 2021-11-15 DIAGNOSIS — R609 Edema, unspecified: Secondary | ICD-10-CM | POA: Diagnosis not present

## 2021-11-15 DIAGNOSIS — L03116 Cellulitis of left lower limb: Secondary | ICD-10-CM | POA: Diagnosis not present

## 2021-11-15 NOTE — Progress Notes (Signed)
Date:  11/15/2021   Name:  Latoya Stevenson   DOB:  06-10-1940   MRN:  [id number]   Chief Complaint: Wound Check  Wound Check She was originally treated more than 14 days ago. Previous treatment included oral antibiotics. Her temperature was unmeasured prior to arrival. There has been clear discharge from the wound. There is no redness present. There is no swelling present. There is no pain present. She has no difficulty moving the affected extremity or digit.    Lab Results  Component Value Date   NA 140 10/24/2021   K 3.7 10/24/2021   CO2 24 10/24/2021   GLUCOSE 91 10/24/2021   BUN 26 (H) 10/24/2021   CREATININE 1.38 (H) 10/24/2021   CALCIUM 9.3 10/24/2021   EGFR 43 (L) 10/07/2021   GFRNONAA 39 (L) 10/24/2021   Lab Results  Component Value Date   CHOL 154 10/07/2021   HDL 55 10/07/2021   LDLCALC 85 10/07/2021   TRIG 70 10/07/2021   CHOLHDL 2.8 10/07/2021   Lab Results  Component Value Date   TSH 0.037 (L) 10/07/2021   No results found for: "HGBA1C" Lab Results  Component Value Date   WBC 8.2 10/24/2021   HGB 11.8 (L) 10/24/2021   HCT 38.6 10/24/2021   MCV 92.3 10/24/2021   PLT 193 10/24/2021   Lab Results  Component Value Date   ALT 16 10/07/2021   AST 17 10/07/2021   ALKPHOS 76 10/07/2021   BILITOT 0.7 10/07/2021   Lab Results  Component Value Date   VD25OH 32.16 01/21/2021     Review of Systems  Constitutional:  Negative for chills and fatigue.  Respiratory:  Negative for chest tightness and shortness of breath.   Cardiovascular:  Negative for chest pain.  Skin:  Positive for wound.    Patient Active Problem List   Diagnosis Date Noted   Generalized anxiety disorder 11/04/2021   Left leg cellulitis 10/09/2021   Dyslipidemia 10/09/2021   Essential hypertension 10/09/2021   History of breast cancer 10/09/2021   Lumbar radiculopathy 10/09/2021   Malignant neoplasm of 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 ([place]) 08/04/2019   Esophageal  stenosis 01/28/2018   Varicose veins of left leg with edema 07/29/2017   Dysphagia, oropharyngeal 04/30/2017   Lumbar degenerative disc disease 04/30/2017   Spinal stenosis of lumbar region 12/08/2016   Idiopathic scoliosis of lumbar region 09/09/2016   Chronic midline low back pain with right-sided sciatica 07/28/2016   GERD without esophagitis 07/27/2015   Intermittent vertigo 07/27/2015   Osteoporosis, senile 07/27/2015   Seasonal allergic rhinitis due to pollen 07/27/2015   Acquired hypothyroidism 01/05/2015   Benign essential hypertension 06/29/2014   Mixed hyperlipidemia 06/29/2014   Stage 3b chronic kidney disease ([place]) 06/29/2014   Insomnia 11/11/2013   Mild reactive airways disease 11/11/2013    Allergies  Allergen Reactions   Flagyl [Metronidazole Hcl] Swelling    Swelling in face, mouth, throat, palms of feet and hands.   Furosemide Itching    Made side of mouth irritated and puffy.  The rim inside her lip swelled up   Macrodantin [Nitrofurantoin Macrocrystal] Anaphylaxis, Hives and Swelling   Penicillins Anaphylaxis and Swelling    PATIENT HAD A PCN REACTION WITH IMMEDIATE RASH, FACIAL/TONGUE/THROAT SWELLING, SOB, OR LIGHTHEADEDNESS WITH HYPOTENSION:  #  #  #  YES  #  #  #   Has patient had a PCN reaction causing severe rash involving mucus membranes or skin necrosis: No Has patient had  a PCN reaction that required hospitalization: No Has patient had a PCN reaction occurring within the last 10 years: No If all of the above answers are "NO", then may proceed with Cephalosporin use.    Celebrex [Celecoxib] Swelling    Swelling in joints.  Did not agree with her. Did not help her either   Other     Pt says it was recently discovered she allergic to an ingredient in local anesthesia but she can't recall the name. (epinephrine and the additive used for the biopsy. Gets very nervous. Feels internally shaking. 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 Tito Dine FRACTURE SURGERY Left 2003   arch of foot.  no metal. per patient, she did not have surgery for this fracture. healed on its own   ANTERIOR LAT LUMBAR FUSION Left 09/09/2016   Procedure: Left Lumbar two-three Anterolateral lumbar interbody fusion with lateral plate;  Surgeon: Erline Levine, MD;  Location: [place];  Service: Neurosurgery;  Laterality: Left;  Left L2-3 Anterolateral lumbar interbody fusion with lateral plate   APPENDECTOMY     BACK SURGERY  2013, 2016, 2018   lumbar lam, fusion, discectomy.titanium   BIOPSY THYROID     x2  thyroid nodule ; Dr. Sharlet Salina and one by Dr. Sabra Heck   BREAST BIOPSY Left 08/02/2019   coil clip-INVASIVE MAMMARY CARCINOMA   BREAST CYST ASPIRATION  1970's   BREAST LUMPECTOMY Left 2021   BREAST LUMPECTOMY WITH RADIOFREQUENCY TAG IDENTIFICATION Left 10/11/9789   Procedure: BREAST LUMPECTOMY WITH RADIOFREQUENCY TAG IDENTIFICATION;  Surgeon: Ronny Bacon, MD;  Location: [facility];  Service: General;  Laterality: Left;   BREAST LUMPECTOMY WITH SENTINEL LYMPH NODE BIOPSY Left 08/12/2019   Procedure: BREAST LUMPECTOMY WITH SENTINEL LYMPH NODE BX;  Surgeon: Ronny Bacon, MD;  Location: [facility];  Service: General;  Laterality: Left;   broken arm Left 1994   wrist fracture surgery, left.  metal has been replaced.   CARDIOVASCULAR STRESS TEST     2010   COLONOSCOPY  2010   COLONOSCOPY  07/21/2013   PHX OF CP/REPEAT 48YRS/OH   ESOPHAGOGASTRODUODENOSCOPY (EGD) WITH PROPOFOL N/A 09/22/2018   Procedure: ESOPHAGOGASTRODUODENOSCOPY (EGD) WITH PROPOFOL;  Surgeon: Toledo, Benay Pike, MD;  Location: [facility];  Service: Gastroenterology;  Laterality: N/A;  Patient to have Rapid COVID-19 day of procedure Kieth Brightly approved this)   EYE SURGERY Bilateral    cataract surgery with lens implant   FOREIGN BODY REMOVAL N/A 12/09/2019   Procedure: FOREIGN BODY  REMOVAL ADULT;  Surgeon: Hessie Knows, MD;  Location: [facility];  Service: Orthopedics;  Laterality: N/A;   INCISION AND DRAINAGE Left 12/09/2019   Procedure: INCISION AND DRAINAGE;  Surgeon: Hessie Knows, MD;  Location: [facility];  Service: Orthopedics;  Laterality: Left;   LACERATION REPAIR Bilateral 12/09/2019   Procedure: REPAIR MULTIPLE LACERATIONS;  Surgeon: Hessie Knows, MD;  Location: [facility];  Service: Orthopedics;  Laterality: Bilateral;   LUMBAR LAMINECTOMY/DECOMPRESSION MICRODISCECTOMY  11/18/2011   Procedure: LUMBAR LAMINECTOMY/DECOMPRESSION MICRODISCECTOMY 2 LEVELS;  Surgeon: Erline Levine, MD;  Location: [place] [facility];  Service: Neurosurgery;  Laterality: N/A;  Lumbar Three-Four,  Lumbar Four-Five Decompressive Laminectomy   MALONEY DILATION N/A 09/22/2018   Procedure: MALONEY DILATION;  Surgeon: Toledo, Benay Pike, MD;  Location: [facility];  Service: Gastroenterology;  Laterality: N/A;   OVARIAN CYST SURGERY  POSTERIOR FUSION LUMBAR SPINE  2016   TOTAL THYROIDECTOMY     TRANSTHORACIC ECHOCARDIOGRAM  2010   upper and lower GI  2010    Social History   Tobacco Use   Smoking status: Never   Smokeless tobacco: Never  Vaping Use   Vaping Use: Never used  Substance Use Topics   Alcohol use: Not Currently    Comment: socially-rare   Drug use: No     Medication list has been reviewed and updated.  Current Meds  Medication Sig   acetaminophen (TYLENOL) 650 MG CR tablet Take 1,300 mg by mouth every 8 (eight) hours as needed for pain.   amLODipine (NORVASC) 2.5 MG tablet Take 2.5 mg by mouth daily.   anastrozole (ARIMIDEX) 1 MG tablet TAKE 1 TABLET EVERY DAY   atorvastatin (LIPITOR) 40 MG tablet Take 1 tablet (40 mg total) by mouth at bedtime.   Calcium Carbonate-Vitamin D 600-400 MG-UNIT tablet Take 2 tablets by mouth daily.   chlorthalidone (HYGROTON) 25 MG tablet Take 25 mg by mouth in the morning and at bedtime.    cholecalciferol (VITAMIN D3) 25 MCG (1000 UNIT)  tablet Take 1,000 Units by mouth daily.   denosumab (PROLIA) 60 MG/ML SOSY injection Inject 60 mg into the skin every 6 (six) months.   diclofenac Sodium (VOLTAREN) 1 % GEL Apply 2 g topically 4 (four) times daily.   fexofenadine (ALLEGRA) 180 MG tablet Take 180 mg by mouth daily.   fluticasone (FLONASE) 50 MCG/ACT nasal spray Place 2 sprays into both nostrils daily as needed for allergies.   gabapentin (NEURONTIN) 300 MG capsule Take 300 mg by mouth at bedtime.   GLUCOSAMINE-CHONDROITIN PO Take 1 tablet by mouth daily.    irbesartan (AVAPRO) 300 MG tablet Take 1 tablet (300 mg total) by mouth daily.   levothyroxine (SYNTHROID) 150 MCG tablet Take 1 tablet (150 mcg total) by mouth daily before breakfast.   Multiple Vitamin (MULTIVITAMIN WITH MINERALS) TABS Take 1 tablet by mouth daily.   omeprazole (PRILOSEC) 40 MG capsule Take 1 capsule (40 mg total) by mouth daily before breakfast.   omeprazole (PRILOSEC) 40 MG capsule Take 1 capsule (40 mg total) by mouth daily.   sertraline (ZOLOFT) 50 MG tablet Take 1 tablet (50 mg total) by mouth daily.   vitamin E 180 MG (400 UNITS) capsule Take 400 Units by mouth daily.       11/15/2021   10:56 AM 11/04/2021   10:21 AM 10/29/2021    2:11 PM 10/07/2021   10:53 AM  GAD 7 : Generalized Anxiety Score  Nervous, Anxious, on Edge 0 3 2 0  Control/stop worrying 0 0 1 0  Worry too much - different things 0 3 1 0  Trouble relaxing 0 3 2 0  Restless 0 0 2 0  Easily annoyed or irritable 0 0 3 0  Afraid - awful might happen 0 0 0 0  Total GAD 7 Score 0 9 11 0  Anxiety Difficulty Not difficult at all Extremely difficult Somewhat difficult Not difficult at all       11/15/2021   10:56 AM 11/04/2021   10:21 AM 10/29/2021    2:11 PM  Depression screen PHQ 2/9  Decreased Interest 0 0 0  Down, Depressed, Hopeless 0 0 1  PHQ - 2 Score 0 0 1  Altered sleeping 0 0 1  Tired, decreased energy _0 Change in appetite 0 3 2  Feeling bad or failure about yourself  0 0 0  Trouble concentrating 0 2 0  Moving slowly or fidgety/restless 0 0 0  Suicidal thoughts 0 0 0  PHQ-9 Score _0 Difficult doing work/chores Not difficult at all Not difficult at all Somewhat difficult    BP Readings from Last 3 Encounters:  11/15/21 116/66  11/04/21 130/70  10/29/21 122/62    Physical Exam Vitals and nursing note reviewed.  Constitutional:      General: She is not in acute distress.    Appearance: She is well-developed.  HENT:     Head: Normocephalic and atraumatic.  Cardiovascular:     Rate and Rhythm: Normal rate and regular rhythm.  Pulmonary:     Effort: Pulmonary effort is normal. No respiratory distress.     Breath sounds: No wheezing or rhonchi.  Musculoskeletal:     Cervical back: Normal range of motion.  Skin:    General: Skin is warm and dry.     Findings: No rash.          Comments: Cellulitis area slightly warm but no more heaped edges to wound which is closed and not draining  Neurological:     Mental Status: She is alert and oriented to person, place, and time.  Psychiatric:        Mood and Affect: Mood normal.        Behavior: Behavior normal.     Wt Readings from Last 3 Encounters:  11/15/21 171 lb 9.6 oz (77.8 kg)  11/04/21 170 lb 3.2 oz (77.2 kg)  10/29/21 169 lb (76.7 kg)    BP 116/66   Pulse 73   Ht _1  (1.6 m)   Wt 171 lb 9.6 oz (77.8 kg)   SpO2 94%   BMI 30.40 kg/m   Assessment and Plan: 1. Left leg cellulitis Clinically resolved Continue to monitor and avoid further injury  2. Dependent edema Recommend trial of compression stockings during the day Also resume walking for exercise Elevate as needed   Partially dictated using Editor, commissioning. Any errors are unintentional.  [name], MD [facility]  11/15/2021

## 2021-11-15 NOTE — Patient Instructions (Signed)
Continue to elevate the legs during the day from time to time  Try some mild compression stockings - wear them during the day but take them off at bedtime

## 2021-11-20 DIAGNOSIS — M8588 Other specified disorders of bone density and structure, other site: Secondary | ICD-10-CM | POA: Diagnosis not present

## 2021-11-20 DIAGNOSIS — E039 Hypothyroidism, unspecified: Secondary | ICD-10-CM | POA: Diagnosis not present

## 2021-11-20 DIAGNOSIS — M810 Age-related osteoporosis without current pathological fracture: Secondary | ICD-10-CM | POA: Diagnosis not present

## 2021-11-27 ENCOUNTER — Other Ambulatory Visit: Payer: Self-pay

## 2021-11-27 DIAGNOSIS — K219 Gastro-esophageal reflux disease without esophagitis: Secondary | ICD-10-CM

## 2021-11-27 DIAGNOSIS — C50312 Malignant neoplasm of 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: Secondary | ICD-10-CM

## 2021-11-28 ENCOUNTER — Telehealth: Payer: Self-pay | Admitting: Internal Medicine

## 2021-11-28 ENCOUNTER — Other Ambulatory Visit: Payer: Self-pay | Admitting: Internal Medicine

## 2021-11-28 DIAGNOSIS — F411 Generalized anxiety disorder: Secondary | ICD-10-CM

## 2021-11-28 DIAGNOSIS — Z8781 Personal history of (healed) traumatic fracture: Secondary | ICD-10-CM | POA: Diagnosis not present

## 2021-11-28 DIAGNOSIS — N1832 Chronic kidney disease, stage 3b: Secondary | ICD-10-CM | POA: Diagnosis not present

## 2021-11-28 DIAGNOSIS — M810 Age-related osteoporosis without current pathological fracture: Secondary | ICD-10-CM | POA: Diagnosis not present

## 2021-11-28 NOTE — Telephone Encounter (Signed)
Medication Refill - Medication: sertraline (ZOLOFT) 50 MG tablet  Has the patient contacted their pharmacy? Yes.   [facility] pharmacy calling to ask a 90 day supply be sent to them.   Preferred Pharmacy (with phone number or street name): [place], [place] Has the patient been seen for an appointment in the last year OR does the patient have an upcoming appointment? Yes.    Agent: Please be advised that RX refills may take up to 3 business days. We ask that you follow-up with your pharmacy.

## 2021-11-28 NOTE — Telephone Encounter (Signed)
Pt is calling to request if order can be sent to imaging for her mammogram to [facility] or downstairs to [facility].   Cb- [phone]

## 2021-11-29 MED ORDER — SERTRALINE HCL 50 MG PO TABS: 50.0000 mg | ORAL_TABLET | Freq: Every day | ORAL | 0 refills | Status: DC

## 2021-11-29 NOTE — Telephone Encounter (Signed)
Called and left pt a detailed msg informing her that per her chart she is already scheduled for her ordered mammogram. She is scheduled for 01/31/2022 at 340PM at [facility] in [place] at [place] her the number to call [facility] if she has any issues with this appt.  - Demba Nigh

## 2021-11-29 NOTE — Telephone Encounter (Signed)
Requested Prescriptions  Pending Prescriptions Disp Refills  . sertraline (ZOLOFT) 50 MG tablet 90 tablet 0    Sig: Take 1 tablet (50 mg total) by mouth daily.     Psychiatry:  Antidepressants - SSRI - sertraline Passed - 11/28/2021 11:39 AM      Passed - AST in normal range and within 360 days    AST  Date Value Ref Range Status  10/07/2021 17 0 - 40 IU/L Final         Passed - ALT in normal range and within 360 days    ALT  Date Value Ref Range Status  10/07/2021 16 0 - 32 IU/L Final         Passed - Completed PHQ-2 or PHQ-9 in the last 360 days      Passed - Valid encounter within last 6 months    Recent Outpatient Visits          2 weeks ago Left leg cellulitis   [facility] at [facility], Jesse Sans, MD   3 weeks ago Left leg cellulitis   [place] [facility] at [facility], Jesse Sans, MD   1 month ago Left leg cellulitis   [facility] at [place], Jesse Sans, MD   1 month ago Annual physical exam   [facility] at [facility], Jesse Sans, MD   3 months ago Arthralgia, unspecified joint   [place] [facility] at [facility], Jesse Sans, MD      Future Appointments            In 4 months [facility], Jesse Sans, MD [facility] at [facility], [facility]   In 10 months Army Melia, Jesse Sans, MD [place] [facility] at [facility], [facility]

## 2021-12-02 ENCOUNTER — Ambulatory Visit: Payer: Medicare HMO | Admitting: Internal Medicine

## 2021-12-12 ENCOUNTER — Ambulatory Visit
Admission: RE | Admit: 2021-12-12 | Discharge: 2021-12-12 | Disposition: A | Discharge status: Home / Self Care | Payer: Medicare HMO | Source: Ambulatory Visit | Attending: Radiation Oncology | Admitting: Radiation Oncology

## 2021-12-12 ENCOUNTER — Encounter: Payer: Self-pay | Admitting: Radiation Oncology

## 2021-12-12 VITALS — BP 137/84 | HR 77 | Temp 98.2°F | Resp 18 | Ht 63.0 in | Wt 173.0 lb

## 2021-12-12 DIAGNOSIS — Z79811 Long term (current) use of aromatase inhibitors: Secondary | ICD-10-CM | POA: Diagnosis not present

## 2021-12-12 DIAGNOSIS — Z170 Estrogen receptor positive status [ER+]: Secondary | ICD-10-CM | POA: Diagnosis not present

## 2021-12-12 DIAGNOSIS — Z923 Personal history of irradiation: Secondary | ICD-10-CM | POA: Diagnosis not present

## 2021-12-12 DIAGNOSIS — C50012 Malignant neoplasm of nipple and areola, left female breast: Secondary | ICD-10-CM

## 2021-12-12 DIAGNOSIS — C50912 Malignant neoplasm of unspecified site of left female breast: Secondary | ICD-10-CM | POA: Insufficient documentation

## 2021-12-12 DIAGNOSIS — C50312 Malignant neoplasm of 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: Secondary | ICD-10-CM | POA: Diagnosis not present

## 2021-12-12 NOTE — Progress Notes (Signed)
Radiation Oncology Follow up Note  Name: Latoya Stevenson   Date:   12/12/2021 MRN:  [id number] DOB: 06-17-1940    This 81 y.o. 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  She specifically denies breast tenderness cough or bone pain.Marland Kitchen  Her last mammograms back in November which I reviewed were BI-RADS 2 benign.  She is currently on Arimidex tolerating that well without side effect.  COMPLICATIONS OF TREATMENT: none  FOLLOW UP COMPLIANCE: keeps appointments   PHYSICAL EXAM:  BP 137/84 (BP Location: Left Arm, Patient Position: Sitting, Cuff Size: Normal)   Pulse 77   Temp 98.2 F (36.8 C) (Tympanic)   Resp 18   Ht '5\' 3"'$  (1.6 m) Comment: stated ht  Wt 173 lb (78.5 kg)   BMI 30.65 kg/m  Lungs are clear to A&P cardiac examination essentially unremarkable with regular rate and rhythm. No dominant mass or nodularity is noted in either breast in 2 positions examined. Incision is well-healed. No axillary or supraclavicular adenopathy is appreciated. Cosmetic result is excellent.  Well-developed well-nourished patient in NAD. HEENT reveals PERLA, EOMI, discs not visualized.  Oral cavity is clear. No oral mucosal lesions are identified. Neck is clear without evidence of cervical or supraclavicular adenopathy. Lungs are clear to A&P. Cardiac examination is essentially unremarkable with regular rate and rhythm without murmur rub or thrill. Abdomen is benign with no organomegaly or masses noted. Motor sensory and DTR levels are equal and symmetric in the upper and lower extremities. Cranial nerves II through XII are  grossly intact. Proprioception is intact. No peripheral adenopathy or edema is identified. No motor or sensory levels are noted. Crude visual fields are within normal range.  RADIOLOGY RESULTS: Mammograms reviewed compatible with above-stated findings  PLAN: At the present time patient is doing well with no evidence of disease now at 2 years I will see her back 1 more time in a year for follow-up.  She continues on Arimidex without side effect.  Patient is to call with any concerns.  I would like to take this opportunity to thank you for allowing me to participate in the care of your patient.Noreene Filbert, MD

## 2021-12-25 DIAGNOSIS — H16223 Keratoconjunctivitis sicca, not specified as Sjogren's, bilateral: Secondary | ICD-10-CM | POA: Diagnosis not present

## 2021-12-25 DIAGNOSIS — Z0100 Encounter for examination of eyes and vision without abnormal findings: Secondary | ICD-10-CM | POA: Diagnosis not present

## 2022-01-01 ENCOUNTER — Other Ambulatory Visit: Payer: Self-pay | Admitting: Internal Medicine

## 2022-01-01 ENCOUNTER — Ambulatory Visit (INDEPENDENT_AMBULATORY_CARE_PROVIDER_SITE_OTHER): Payer: Medicare HMO

## 2022-01-01 VITALS — BP 118/70 | HR 65 | Temp 97.8°F | Resp 17 | Ht 63.0 in | Wt 173.4 lb

## 2022-01-01 DIAGNOSIS — Z23 Encounter for immunization: Secondary | ICD-10-CM

## 2022-01-01 DIAGNOSIS — Z0000 Encounter for general adult medical examination without abnormal findings: Secondary | ICD-10-CM | POA: Diagnosis not present

## 2022-01-01 NOTE — Progress Notes (Signed)
Subjective:   Latoya Stevenson is a 81 y.o. female who presents for Medicare Annual (Subsequent) preventive examination.  Review of Systems    Cardiac Risk Factors include: advanced age (>35mn, >>44women);female gender          Objective:    Today's Vitals   01/01/22 1505 01/01/22 1517  BP: 118/70   Pulse: 65   Resp: 17   Temp: 97.8 F (36.6 C)   TempSrc: Oral   SpO2: 97%   Weight: 173 lb 6.4 oz (78.7 kg)   Height: '5\' 3"'$  (1.6 m)   PainSc: 0-No pain 0-No pain   Body mass index is 30.72 kg/m.     12/12/2021    9:32 AM 10/24/2021    6:32 PM 10/09/2021    8:30 PM 10/09/2021    3:51 PM 07/22/2021   11:18 AM 01/21/2021   10:24 AM 12/13/2020   10:54 AM  Advanced Directives  Does Patient Have a Medical Advance Directive? Yes No  No Yes Yes Yes  Type of AParamedicof ADes LacsLiving will    HLemon GroveLiving will Living will;Healthcare Power of AMelville Grandfield LLCLiving will;Healthcare Power of ABurtonOut of facility DNR (pink MOST or yellow form)  Does patient want to make changes to medical advance directive? No - Patient declined     No - Patient declined No - Patient declined  Copy of HManisteein Chart? No - copy requested     No - copy requested Yes - validated most recent copy scanned in chart (See row information)  Would patient like information on creating a medical advance directive?  No - Patient declined No - Patient declined        Current Medications (verified) Outpatient Encounter Medications as of 01/01/2022  Medication Sig   acetaminophen (TYLENOL) 650 MG CR tablet Take 1,300 mg by mouth every 8 (eight) hours as needed for pain.   amLODipine (NORVASC) 2.5 MG tablet Take 2.5 mg by mouth daily.   anastrozole (ARIMIDEX) 1 MG tablet TAKE 1 TABLET EVERY DAY   atorvastatin (LIPITOR) 40 MG tablet Take 1 tablet (40 mg total) by mouth at bedtime.   Calcium Carbonate-Vitamin D 600-400 MG-UNIT tablet Take 2 tablets by  mouth daily.   chlorthalidone (HYGROTON) 25 MG tablet Take 25 mg by mouth in the morning and at bedtime.    cholecalciferol (VITAMIN D3) 25 MCG (1000 UNIT) tablet Take 1,000 Units by mouth daily.   denosumab (PROLIA) 60 MG/ML SOSY injection Inject 60 mg into the skin every 6 (six) months.   diclofenac Sodium (VOLTAREN) 1 % GEL Apply 2 g topically 4 (four) times daily.   fexofenadine (ALLEGRA) 180 MG tablet Take 180 mg by mouth daily.   fluticasone (FLONASE) 50 MCG/ACT nasal spray Place 2 sprays into both nostrils daily as needed for allergies.   gabapentin (NEURONTIN) 300 MG capsule Take 300 mg by mouth at bedtime.   GLUCOSAMINE-CHONDROITIN PO Take 1 tablet by mouth daily.    irbesartan (AVAPRO) 300 MG tablet TAKE 1 TABLET EVERY DAY   levothyroxine (SYNTHROID) 150 MCG tablet Take 1 tablet (150 mcg total) by mouth daily before breakfast.   Multiple Vitamin (MULTIVITAMIN WITH MINERALS) TABS Take 1 tablet by mouth daily.   omeprazole (PRILOSEC) 40 MG capsule Take 1 capsule (40 mg total) by mouth daily before breakfast.   sertraline (ZOLOFT) 50 MG tablet Take 1 tablet (50 mg total) by mouth daily. (Patient taking differently: Take 25 mg by  mouth daily.)   vitamin E 180 MG (400 UNITS) capsule Take 400 Units by mouth daily.   [DISCONTINUED] omeprazole (PRILOSEC) 40 MG capsule Take 1 capsule (40 mg total) by mouth daily. (Patient not taking: Reported on 01/01/2022)   No facility-administered encounter medications on file as of 01/01/2022.    Allergies (verified) Flagyl [metronidazole hcl], Furosemide, Macrodantin [nitrofurantoin macrocrystal], Penicillins, Celebrex [celecoxib], and Other   History: Past Medical History:  Diagnosis Date   Allergic rhinitis    Anemia    low iron   Anxiety    Arthritis 07/2019   osteoporosis   Asthma without status asthmaticus    seasonal, not often   Bilateral sciatica    Breast cancer ([facility]) 07/2019   invasive mammary cancer , grade 2   Bronchitis     Cancer ([facility])     follicular variant papillary thyroid cancer   Chronic back pain    unspecified   Chronic kidney disease    ckd stage 3b   Colon polyp    adenomatous   DDD (degenerative disc disease), lumbar    DJD (degenerative joint disease)    Dysphagia    Dyspnea    E coli bacteremia    around 10/11   Environmental allergies    Family history of breast cancer    Family history of colon cancer    Family history of ovarian cancer    Family history of pancreatic cancer    Family history of prostate cancer    Family history of thyroid cancer    GERD (gastroesophageal reflux disease)    Headache    migraines as a young woman   History of recurrent UTIs    Hyperlipidemia    Hypertension    Hypothyroidism    IBS (irritable bowel syndrome)    Lumbar compression fracture ([place]) 12/02/2016   Lumbar disc disease    Mold exposure 2002   hx of    Osteoporosis, post-menopausal    Personal history of radiation therapy    Pneumonia 1978   Restless legs    Scoliosis    Spinal stenosis    Thyroid nodule    aspirated in 1997 and 2003 which were negative   Ulcer    Varicose veins of left lower extremity    Vertigo    Wears dentures    Past Surgical History:  Procedure Laterality Date   ABDOMINAL HYSTERECTOMY  1977   Dr. Tito Dine FRACTURE SURGERY Left 2003   arch of foot.  no metal. per patient, she did not have surgery for this fracture. healed on its own   ANTERIOR LAT LUMBAR FUSION Left 09/09/2016   Procedure: Left Lumbar two-three Anterolateral lumbar interbody fusion with lateral plate;  Surgeon: Erline Levine, MD;  Location: [facility];  Service: Neurosurgery;  Laterality: Left;  Left L2-3 Anterolateral lumbar interbody fusion with lateral plate   APPENDECTOMY     BACK SURGERY  2013, 2016, 2018   lumbar lam, fusion, discectomy.titanium   BIOPSY THYROID     x2  thyroid nodule ; Dr. Sharlet Salina and one by Dr. Sabra Heck   BREAST BIOPSY Left 08/02/2019   coil clip-INVASIVE MAMMARY  CARCINOMA   BREAST CYST ASPIRATION  1970's   BREAST LUMPECTOMY Left 2021   BREAST LUMPECTOMY WITH RADIOFREQUENCY TAG IDENTIFICATION Left 9/92/4268   Procedure: BREAST LUMPECTOMY WITH RADIOFREQUENCY TAG IDENTIFICATION;  Surgeon: Ronny Bacon, MD;  Location: [facility];  Service: General;  Laterality: Left;   BREAST LUMPECTOMY WITH SENTINEL LYMPH NODE  BIOPSY Left 08/12/2019   Procedure: BREAST LUMPECTOMY WITH SENTINEL LYMPH NODE BX;  Surgeon: Ronny Bacon, MD;  Location: [facility];  Service: General;  Laterality: Left;   broken arm Left 1994   wrist fracture surgery, left.  metal has been replaced.   CARDIOVASCULAR STRESS TEST     2010   COLONOSCOPY  2010   COLONOSCOPY  07/21/2013   PHX OF CP/REPEAT 69YRS/[place]   ESOPHAGOGASTRODUODENOSCOPY (EGD) WITH PROPOFOL N/A 09/22/2018   Procedure: ESOPHAGOGASTRODUODENOSCOPY (EGD) WITH PROPOFOL;  Surgeon: Toledo, Benay Pike, MD;  Location: [facility];  Service: Gastroenterology;  Laterality: N/A;  Patient to have Rapid COVID-19 day of procedure Kieth Brightly approved this)   EYE SURGERY Bilateral    cataract surgery with lens implant   FOREIGN BODY REMOVAL N/A 12/09/2019   Procedure: FOREIGN BODY REMOVAL ADULT;  Surgeon: Hessie Knows, MD;  Location: [facility];  Service: Orthopedics;  Laterality: N/A;   INCISION AND DRAINAGE Left 12/09/2019   Procedure: INCISION AND DRAINAGE;  Surgeon: Hessie Knows, MD;  Location: [facility];  Service: Orthopedics;  Laterality: Left;   LACERATION REPAIR Bilateral 12/09/2019   Procedure: REPAIR MULTIPLE LACERATIONS;  Surgeon: Hessie Knows, MD;  Location: [facility];  Service: Orthopedics;  Laterality: Bilateral;   LUMBAR LAMINECTOMY/DECOMPRESSION MICRODISCECTOMY  11/18/2011   Procedure: LUMBAR LAMINECTOMY/DECOMPRESSION MICRODISCECTOMY 2 LEVELS;  Surgeon: Erline Levine, MD;  Location: [facility];  Service: Neurosurgery;  Laterality: N/A;  Lumbar Three-Four,  Lumbar Four-Five Decompressive Laminectomy   MALONEY DILATION N/A 09/22/2018    Procedure: MALONEY DILATION;  Surgeon: Toledo, Benay Pike, MD;  Location: [facility];  Service: Gastroenterology;  Laterality: N/A;   OVARIAN CYST SURGERY     POSTERIOR FUSION LUMBAR SPINE  2016   TOTAL THYROIDECTOMY     TRANSTHORACIC ECHOCARDIOGRAM  2010   upper and lower GI  2010   Family History  Problem Relation Age of Onset   Breast cancer Mother 67   Thyroid cancer Mother    Pancreatic cancer Brother    Pancreatic cancer Sister    Suicidality Father    Prostate cancer Brother    Colon cancer Brother    Breast cancer Sister        x 2 times   Heart attack Sister    Ovarian cancer Maternal Aunt    Breast cancer Paternal Aunt    Lung cancer Paternal Uncle    Breast cancer Other    Colon cancer Maternal Aunt    Pancreatic cancer Maternal Aunt    Social History   Socioeconomic History   Marital status: Widowed    Spouse name: Not on file   Number of children: 2   Years of education: Not on file   Highest education level: Not on file  Occupational History   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   Social Determinants of Health   Financial Resource Strain: Low Risk  (01/01/2022)   Overall Financial Resource Strain (CARDIA)    Difficulty of Paying Living Expenses: Not very hard  Food Insecurity:  No Food Insecurity (01/01/2022)   Hunger Vital Sign    Worried About Running Out of Food in the Last Year: Never true    Ran Out of Food in the Last Year: Never true  Transportation Needs: No Transportation Needs (01/01/2022)   PRAPARE - Armed forces logistics/support/administrative officer (Medical): No    Lack of Transportation (Non-Medical): No  Physical Activity: Insufficiently Active (01/01/2022)   Exercise Vital Sign    Days of Exercise per Week: 3 days    Minutes of Exercise per Session: 30 min  Stress: No Stress Concern Present (01/01/2022)   [place]    Feeling of Stress : Not at all  Social Connections: Socially Isolated (01/01/2022)   Social Connection and Isolation Panel [NHANES]    Frequency of Communication with Friends and Family: More than three times a week    Frequency of Social Gatherings with Friends and Family: Three times a week    Attends Religious Services: Never    Active Member of Clubs or Organizations: No    Attends Archivist Meetings: Never    Marital Status: Widowed    Tobacco Counseling Counseling given: No   Clinical Intake:  Pre-visit preparation completed: No  Pain : No/denies pain Pain Score: 0-No pain     Nutritional Status: BMI > 30  Obese Nutritional Risks: None Diabetes: No  How often do you need to have someone help you when you read instructions, pamphlets, or other written materials from your doctor or pharmacy?: 1 - Never  Diabetic?No  Interpreter Needed?: No  Information entered by :: Latoya Stevenson, cMA   Activities of Daily Living    01/01/2022    3:14 PM 10/09/2021    8:30 PM  In your present state of health, do you have any difficulty performing the following activities:  Hearing? 0 0  Vision? [place]   Difficulty concentrating or making decisions? 0 0  Walking or climbing stairs? 1 1  Dressing or bathing? 0 0  Doing errands, shopping? 0 0    Patient Care Team: Glean Hess, MD as PCP - General (Internal Medicine) Sindy Guadeloupe, MD as Consulting Physician (Oncology) Noreene Filbert, MD as Referring Physician (Radiation Oncology) Ronny Bacon, MD as  Consulting Physician (General Surgery) Rico Junker, RN as Registered Nurse Quintin Alto, MD as Consulting Physician (Rheumatology) Gabriel Carina Betsey Holiday, MD as Physician Assistant (Endocrinology) Teodoro Spray, MD as Consulting Physician (Cardiology) Efrain Sella, MD as Consulting Physician (Gastroenterology)  Indicate any recent Medical Services you may have received from other than [facility] providers in the past year (date may be approximate). Pt was  hospitalize at [facility] on 10/24/2021 for recurrent cellulitis.   This is a routine wellness examination for [facility].  Hearing/Vision screen Denies any hearing issues. [facility] Exam at [facility], Denies any vision issues   Dietary issues and exercise activities discussed: Current Exercise Habits: Structured exercise class;Home exercise routine, Time (Minutes): 15, Frequency (Times/Week): 3, Weekly Exercise (Minutes/Week): 45, Intensity: Mild   Goals Addressed             This Visit's Progress    Stay Active and Independent       Why is this important?   Regular activity or exercise is important to managing back pain.  Activity helps to keep your muscles strong.  You will sleep better and feel more relaxed.  You will have more energy and feel less stressed.  If you are not active now, start slowly. Little changes make a big difference.  Rest, but not too much.  Stay as active as you can and listen to your body's signals.            Depression Screen    01/01/2022    3:13 PM 11/15/2021   10:56 AM 11/04/2021   10:21 AM 10/29/2021    2:11 PM 10/07/2021   10:53 AM 08/23/2021    9:08 AM 06/04/2021    2:50 PM  PHQ 2/9 Scores  PHQ - 2 Score 0 0 0 1 0 0 0  PHQ- 9 Score 0 '3 8 6 2 1 6    '$ Fall Risk    01/01/2022    3:13 PM 11/15/2021   10:56 AM 11/04/2021   10:21 AM 10/29/2021    2:11 PM 10/07/2021   10:53 AM  Fall Risk   Falls in the past year? 0 '1 1 1 '$ 0  Number falls in past yr: 0 '1 1 1 '$ 0  Injury with Fall? 0 0 1 1 0   Risk for fall due to : No Fall Risks History of fall(s) History of fall(s) History of fall(s) No Fall Risks  Follow up Falls evaluation completed Falls evaluation completed Falls evaluation completed Falls evaluation completed Falls evaluation completed    [address]:  Any stairs in or around the home?  No If so, are there any without handrails? No  Home free of loose throw rugs in walkways, pet beds, electrical cords, etc? Yes  Adequate lighting in your home to reduce risk of falls? Yes   ASSISTIVE DEVICES UTILIZED TO PREVENT FALLS:  Life alert? No  Use of a cane, walker or w/c? No  Grab bars in the bathroom? Yes  Shower chair or bench in shower? No  Elevated toilet seat or a handicapped toilet? No   TIMED UP AND GO:  Was the test performed? Yes .  Length of time to ambulate 10 feet: 10 sec.   Gait steady and fast without use of assistive device  Cognitive Function:        01/01/2022    3:15 PM  6CIT Screen  What Year? 0 points  What month? 3 points  What time? 0 points  Count back from 20 0 points  Months in reverse 0 points  Repeat phrase 2 points  Total Score 5 points    Immunizations Immunization History  Administered Date(s) Administered   Influenza Split 02/15/2014, 01/05/2015   Influenza,inj,Quad PF,6+ Mos 03/08/2019   Influenza-Unspecified 01/28/2017, 02/12/2021   PFIZER(Purple Top)SARS-COV-2 Vaccination 05/31/2019, 06/21/2019, 04/03/2020   PNEUMOCOCCAL CONJUGATE-20 10/07/2021   Pneumococcal Conjugate-13 07/31/2016   Tdap 08/09/2014    TDAP status: Up to date  Flu Vaccine status: Completed at today's visit  Pneumococcal vaccine status: Up to date  Covid-19 vaccine status: Information provided on how to obtain vaccines.   Qualifies for Shingles Vaccine? Yes   Zostavax completed No   Shingrix Completed?: No.    Education has been provided regarding the importance of this vaccine. Patient has been advised to call  insurance company to determine out of pocket expense if they have not yet received this vaccine. Advised may also receive vaccine at local pharmacy or Health Dept. Verbalized acceptance and understanding.  Screening Tests Health Maintenance  Topic Date Due   Zoster Vaccines- Shingrix (1 of 2) Never done   COVID-19  Vaccine (4 - Pfizer risk series) 05/29/2020   INFLUENZA VACCINE  06/15/2022 (Originally 10/15/2021)   TETANUS/TDAP  08/08/2024   Pneumonia Vaccine 38+ Years old  Completed   DEXA SCAN  Completed   Hepatitis C Screening  Completed   HPV VACCINES  Aged Out    Health Maintenance  Health Maintenance Due  Topic Date Due   Zoster Vaccines- Shingrix (1 of 2) Never done   COVID-19 Vaccine (4 - Pfizer risk series) 05/29/2020    Colorectal cancer screening: No longer required.   Mammogram status: No longer required due to age.  DEXA Scan: 11/28/2019   Lung Cancer Screening: (Low Dose CT Chest recommended if Age 26-80 years, 30 pack-year currently smoking OR have quit w/in 15years.) does not qualify.     Additional Screening:  Hepatitis C Screening: does qualify; Completed 10/07/2021  Vision Screening: Recommended annual ophthalmology exams for early detection of glaucoma and other disorders of the eye. Is the patient up to date with their annual eye exam?  Yes  Who is the provider or what is the name of the office in which the patient attends annual eye exams? [facility]  If pt is not established with a provider, would they like to be referred to a provider to establish care? No .   Dental Screening: Recommended annual dental exams for proper oral hygiene  Community Resource Referral / Chronic Care Management: CRR required this visit?  No   CCM required this visit?  No      Plan:     I have personally reviewed and noted the following in the patient's chart:   Medical and social history Use of alcohol, tobacco or illicit drugs  Current medications and  supplements including opioid prescriptions. Patient is not currently taking opioid prescriptions. Functional ability and status Nutritional status Physical activity Advanced directives List of other physicians Hospitalizations, surgeries, and ER visits in previous 12 months Vitals Screenings to include cognitive, depression, and falls Referrals and appointments  In addition, I have reviewed and discussed with patient certain preventive protocols, quality metrics, and best practice recommendations. A written personalized care plan for preventive services as well as general preventive health recommendations were provided to patient.    Latoya Stevenson , Thank you for taking time to come for your Medicare Wellness Visit. I appreciate your ongoing commitment to your health goals. Please review the following plan we discussed and let me know if I can assist you in the future.   These are the goals we discussed:  Goals      Stay Active and Independent     Why is this important?   Regular activity or exercise is important to managing back pain.  Activity helps to keep your muscles strong.  You will sleep better and feel more relaxed.  You will have more energy and feel less stressed.  If you are not active now, start slowly. Little changes make a big difference.  Rest, but not too much.  Stay as active as you can and listen to your body's signals.             This is a list of the screening recommended for you and due dates:  Health Maintenance  Topic Date Due   Zoster (Shingles) Vaccine (1 of 2) Never done   COVID-19 Vaccine (4 - Pfizer risk series) 05/29/2020   Flu Shot  06/15/2022*   Tetanus Vaccine  08/08/2024   Pneumonia Vaccine  Completed   DEXA scan (  bone density measurement)  Completed   Hepatitis C Screening: USPSTF Recommendation to screen - Ages 48-79 yo.  Completed   HPV Vaccine  Aged Out  *Topic was postponed. The date shown is not the original due date.      Latoya Stevenson, [place]   01/01/2022   Nurse Notes: Approximately 30 minute Face-To-Face Visit

## 2022-01-01 NOTE — Patient Instructions (Signed)

## 2022-01-01 NOTE — Telephone Encounter (Signed)
Requested Prescriptions  Pending Prescriptions Disp Refills  . irbesartan (AVAPRO) 300 MG tablet [Pharmacy Med Name: IRBESARTAN 300 MG Tablet] 90 tablet 1    Sig: TAKE 1 TABLET EVERY DAY     Cardiovascular:  Angiotensin Receptor Blockers Failed - 01/01/2022 10:21 AM      Failed - Cr in normal range and within 180 days    Creatinine, Ser  Date Value Ref Range Status  10/24/2021 1.38 (H) 0.44 - 1.00 mg/dL Final         Passed - K in normal range and within 180 days    Potassium  Date Value Ref Range Status  10/24/2021 3.7 3.5 - 5.1 mmol/L Final         Passed - Patient is not pregnant      Passed - Last BP in normal range    BP Readings from Last 1 Encounters:  12/12/21 137/84         Passed - Valid encounter within last 6 months    Recent Outpatient Visits          1 month ago Left leg cellulitis   [place] [facility] at [facility], Jesse Sans, MD   1 month ago Left leg cellulitis   [place] [facility] at [facility], Jesse Sans, MD   2 months ago Left leg cellulitis   [place] [facility] at [facility], Jesse Sans, MD   2 months ago Annual physical exam   [facility] at [facility], Jesse Sans, MD   4 months ago Arthralgia, unspecified joint   [place] [facility] at [facility], Jesse Sans, MD      Future Appointments            In 3 months Army Melia, Jesse Sans, MD [facility] at [facility], [facility]   In 9 months Army Melia, Jesse Sans, MD [place] [facility] at [facility], [facility]

## 2022-01-13 DIAGNOSIS — M810 Age-related osteoporosis without current pathological fracture: Secondary | ICD-10-CM | POA: Diagnosis not present

## 2022-01-13 DIAGNOSIS — H16223 Keratoconjunctivitis sicca, not specified as Sjogren's, bilateral: Secondary | ICD-10-CM | POA: Diagnosis not present

## 2022-01-22 ENCOUNTER — Encounter: Payer: Self-pay | Admitting: Oncology

## 2022-01-22 ENCOUNTER — Inpatient Hospital Stay: Payer: Medicare HMO | Attending: Oncology | Admitting: Oncology

## 2022-01-22 VITALS — BP 143/93 | HR 69 | Temp 97.9°F | Resp 18 | Wt 171.4 lb

## 2022-01-22 DIAGNOSIS — C50912 Malignant neoplasm of unspecified site of left female breast: Secondary | ICD-10-CM | POA: Diagnosis not present

## 2022-01-22 DIAGNOSIS — Z08 Encounter for follow-up examination after completed treatment for malignant neoplasm: Secondary | ICD-10-CM | POA: Diagnosis not present

## 2022-01-22 DIAGNOSIS — Z79811 Long term (current) use of aromatase inhibitors: Secondary | ICD-10-CM | POA: Diagnosis not present

## 2022-01-22 DIAGNOSIS — M810 Age-related osteoporosis without current pathological fracture: Secondary | ICD-10-CM | POA: Diagnosis not present

## 2022-01-22 DIAGNOSIS — Z853 Personal history of malignant neoplasm of breast: Secondary | ICD-10-CM

## 2022-01-22 DIAGNOSIS — Z5181 Encounter for therapeutic drug level monitoring: Secondary | ICD-10-CM | POA: Diagnosis not present

## 2022-01-22 DIAGNOSIS — Z170 Estrogen receptor positive status [ER+]: Secondary | ICD-10-CM | POA: Diagnosis not present

## 2022-01-22 NOTE — Progress Notes (Signed)
Hematology/Oncology Consult note [facility]  Telephone:([phone] [phone]  Patient Care Team: Glean Hess, MD as PCP - General (Internal Medicine) Sindy Guadeloupe, MD as Consulting Physician (Oncology) Noreene Filbert, MD as Referring Physician (Radiation Oncology) Ronny Bacon, MD as Consulting Physician (General Surgery) Rico Junker, RN as Registered Nurse Quintin Alto, MD as Consulting Physician (Rheumatology) Gabriel Carina Betsey Holiday, MD as Physician Assistant (Endocrinology) Teodoro Spray, MD as Consulting Physician (Cardiology) Efrain Sella, MD as Consulting Physician (Gastroenterology)   Name of the patient: Latoya Stevenson  [id number]  10/13/1940   Date of visit: 01/22/22  Diagnosis- pathological prognostic 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  She then noticed possible nipple discharge from her right breast which prompted a diagnostic right breast mammogram which was essentially unremarkable.  MRI was recommended and patient underwent a bilateral MRI which did not show any concerning findings in the right breast where she had the nipple discharge but showed an enhancing mass in the inner left breast measuring 0.9 x 0.6 x 0.7 cm which was suspicious and biopsy. Biopsy was consistent with invasive mammary carcinoma grade 2 6 mm ER greater than 90% positive PR greater than 90% positive and HER-2 negative.   Genetic testing negative   Final pathology showed 7 mm grade 2 invasive mammary carcinoma with positive unifocal anterior margin.  1 sentinel lymph node negative for malignancy.  PT1BPN0 tumor was greater than 90% ER positive PR positive  and HER-2 negative   Patient completed adjuvant radiation therapy and started Arimidex in October 2021.  She has baseline osteoporosis and follows up with endocrinology.  She gets Prolia    Interval history-tolerating Arimidex well for the most part with calcium and vitamin D.  Arthralgias are mild and self-limited.  ECOG PS- 1 Pain scale- 0   Review of systems- Review of Systems  Constitutional:  Negative for chills, fever, malaise/fatigue and weight loss.  HENT:  Negative for congestion, ear discharge and nosebleeds.   Eyes:  Negative for blurred vision.  Respiratory:  Negative for cough, hemoptysis, sputum production, shortness of breath and wheezing.   Cardiovascular:  Negative for chest pain, palpitations, orthopnea and claudication.  Gastrointestinal:  Negative for abdominal pain, blood in stool, constipation, diarrhea, heartburn, melena, nausea and vomiting.  Genitourinary:  Negative for dysuria, flank pain, frequency, hematuria and urgency.  Musculoskeletal:  Negative for back pain, joint pain and myalgias.  Skin:  Negative for rash.  Neurological:  Negative for dizziness, tingling, focal weakness, seizures, weakness and headaches.  Endo/Heme/Allergies:  Does not bruise/bleed easily.  Psychiatric/Behavioral:  Negative for depression and suicidal ideas. The patient does not have insomnia.       Allergies  Allergen Reactions   Flagyl [Metronidazole Hcl] Swelling    Swelling in face, mouth, throat, palms of feet and hands.   Furosemide Itching    Made side of mouth irritated and puffy.  The rim inside her lip swelled up   Macrodantin [Nitrofurantoin Macrocrystal] Anaphylaxis, Hives and Swelling   Penicillins Anaphylaxis and Swelling    PATIENT HAD A PCN REACTION WITH IMMEDIATE RASH, FACIAL/TONGUE/THROAT SWELLING, SOB, OR LIGHTHEADEDNESS WITH HYPOTENSION:  #  #  #  YES  #  #  #  Has patient had a PCN reaction causing severe rash involving mucus membranes or skin necrosis:  No Has patient had a PCN reaction that required hospitalization: No Has patient had a PCN reaction occurring within the last 10 years: No If all of the above answers are "NO", then may proceed with Cephalosporin use.    Celebrex [Celecoxib] Swelling    Swelling in joints.  Did not agree with her. Did not help her either   Other     Pt says it was recently discovered she allergic to an ingredient in local anesthesia but she can't recall the name. (epinephrine and the additive used for the biopsy. Gets very nervous. Feels internally shaking. 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 (2018 and 2021)     Past Medical History:  Diagnosis Date   Allergic rhinitis    Anemia    low iron   Anxiety    Arthritis 07/2019   osteoporosis   Asthma without status asthmaticus    seasonal, not often   Bilateral sciatica    Breast cancer ([place]) 07/2019   invasive mammary cancer , grade 2   Bronchitis    Cancer ([facility])     follicular variant papillary thyroid cancer   Chronic back pain    unspecified   Chronic kidney disease    ckd stage 3b   Colon polyp    adenomatous   DDD (degenerative disc disease), lumbar    DJD (degenerative joint disease)    Dysphagia    Dyspnea    E coli bacteremia    around 10/11   Environmental allergies    Family history of breast cancer    Family history of colon cancer    Family history of ovarian cancer    Family history of pancreatic cancer    Family history of prostate cancer    Family history of thyroid cancer    GERD (gastroesophageal reflux disease)    Headache    migraines as a young woman   History of recurrent UTIs    Hyperlipidemia    Hypertension    Hypothyroidism    IBS (irritable bowel syndrome)    Lumbar compression fracture ([place]) 12/02/2016   Lumbar disc disease    Mold exposure 2002   hx of    Osteoporosis, post-menopausal    Personal history of radiation therapy    Pneumonia 1978   Restless legs    Scoliosis     Spinal stenosis    Thyroid nodule    aspirated in 1997 and 2003 which were negative   Ulcer    Varicose veins of left lower extremity    Vertigo    Wears dentures      Past Surgical History:  Procedure Laterality Date   ABDOMINAL HYSTERECTOMY  1977   Dr. Tito Dine FRACTURE SURGERY Left 2003   arch of foot.  no metal. per patient, she did not have surgery for this fracture. healed on its own   ANTERIOR LAT LUMBAR FUSION Left 09/09/2016   Procedure: Left Lumbar two-three Anterolateral lumbar interbody fusion with lateral plate;  Surgeon: Erline Levine, MD;  Location: [place];  Service: Neurosurgery;  Laterality: Left;  Left L2-3 Anterolateral lumbar interbody fusion with lateral plate   APPENDECTOMY     BACK SURGERY  2013, 2016, 2018   lumbar lam, fusion, discectomy.titanium   BIOPSY THYROID     x2  thyroid nodule ; Dr. Sharlet Salina and one by Dr. Sabra Heck  BREAST BIOPSY Left 08/02/2019   coil clip-INVASIVE MAMMARY CARCINOMA   BREAST CYST ASPIRATION  1970's   BREAST LUMPECTOMY Left 2021   BREAST LUMPECTOMY WITH RADIOFREQUENCY TAG IDENTIFICATION Left 05/12/3333   Procedure: BREAST LUMPECTOMY WITH RADIOFREQUENCY TAG IDENTIFICATION;  Surgeon: Ronny Bacon, MD;  Location: [facility];  Service: General;  Laterality: Left;   BREAST LUMPECTOMY WITH SENTINEL LYMPH NODE BIOPSY Left 08/12/2019   Procedure: BREAST LUMPECTOMY WITH SENTINEL LYMPH NODE BX;  Surgeon: Ronny Bacon, MD;  Location: [facility];  Service: General;  Laterality: Left;   broken arm Left 1994   wrist fracture surgery, left.  metal has been replaced.   CARDIOVASCULAR STRESS TEST     2010   COLONOSCOPY  2010   COLONOSCOPY  07/21/2013   PHX OF CP/REPEAT 71YRS/[place]   ESOPHAGOGASTRODUODENOSCOPY (EGD) WITH PROPOFOL N/A 09/22/2018   Procedure: ESOPHAGOGASTRODUODENOSCOPY (EGD) WITH PROPOFOL;  Surgeon: Toledo, Benay Pike, MD;  Location: [facility];  Service: Gastroenterology;  Laterality: N/A;  Patient to have Rapid COVID-19 day of  procedure Kieth Brightly approved this)   EYE SURGERY Bilateral    cataract surgery with lens implant   FOREIGN BODY REMOVAL N/A 12/09/2019   Procedure: FOREIGN BODY REMOVAL ADULT;  Surgeon: Hessie Knows, MD;  Location: [facility];  Service: Orthopedics;  Laterality: N/A;   INCISION AND DRAINAGE Left 12/09/2019   Procedure: INCISION AND DRAINAGE;  Surgeon: Hessie Knows, MD;  Location: [facility];  Service: Orthopedics;  Laterality: Left;   LACERATION REPAIR Bilateral 12/09/2019   Procedure: REPAIR MULTIPLE LACERATIONS;  Surgeon: Hessie Knows, MD;  Location: [facility];  Service: Orthopedics;  Laterality: Bilateral;   LUMBAR LAMINECTOMY/DECOMPRESSION MICRODISCECTOMY  11/18/2011   Procedure: LUMBAR LAMINECTOMY/DECOMPRESSION MICRODISCECTOMY 2 LEVELS;  Surgeon: Erline Levine, MD;  Location: [facility];  Service: Neurosurgery;  Laterality: N/A;  Lumbar Three-Four,  Lumbar Four-Five Decompressive Laminectomy   MALONEY DILATION N/A 09/22/2018   Procedure: MALONEY DILATION;  Surgeon: Toledo, Benay Pike, MD;  Location: [facility];  Service: Gastroenterology;  Laterality: N/A;   OVARIAN CYST SURGERY     POSTERIOR FUSION LUMBAR SPINE  2016   TOTAL THYROIDECTOMY     TRANSTHORACIC ECHOCARDIOGRAM  2010   upper and lower GI  2010    Social History   Socioeconomic History   Marital status: Widowed    Spouse name: Not on file   Number of children: 2   Years of education: Not on file   Highest education level: Not on file  Occupational History   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   Social  Determinants of Health   Financial Resource Strain: Low Risk  (01/01/2022)   Overall Financial Resource Strain (CARDIA)    Difficulty of Paying Living Expenses: Not very hard  Food Insecurity: No Food Insecurity (01/01/2022)   Hunger Vital Sign    Worried About Running Out of Food in the Last Year: Never true    Ran Out of Food in the Last Year: Never true  Transportation Needs: No Transportation Needs (01/01/2022)  PRAPARE - Hydrologist (Medical): No    Lack of Transportation (Non-Medical): No  Physical Activity: Insufficiently Active (01/01/2022)   Exercise Vital Sign    Days of Exercise per Week: 3 days    Minutes of Exercise per Session: 30 min  Stress: No Stress Concern Present (01/01/2022)   [place]    Feeling of Stress : Not at all  Social Connections: Socially Isolated (01/01/2022)   Social Connection and Isolation Panel [NHANES]    Frequency of Communication with Friends and Family: More than three times a week    Frequency of Social Gatherings with Friends and Family: Three times a week    Attends Religious Services: Never    Active Member of Clubs or Organizations: No    Attends Archivist Meetings: Never    Marital Status: Widowed  Intimate Partner Violence: Not At Risk (01/01/2022)   Humiliation, Afraid, Rape, and Kick questionnaire    Fear of Current or Ex-Partner: No    Emotionally Abused: No    Physically Abused: No    Sexually Abused: No    Family History  Problem Relation Age of Onset   Breast cancer Mother 86   Thyroid cancer Mother    Pancreatic cancer Brother    Pancreatic cancer Sister    Suicidality Father    Prostate cancer Brother    Colon cancer Brother    Breast cancer Sister        x 2 times   Heart attack Sister    Ovarian cancer Maternal Aunt    Breast cancer Paternal Aunt    Lung cancer Paternal Uncle    Breast cancer Other     Colon cancer Maternal Aunt    Pancreatic cancer Maternal Aunt      Current Outpatient Medications:    acetaminophen (TYLENOL) 650 MG CR tablet, Take 1,300 mg by mouth every 8 (eight) hours as needed for pain., Disp: , Rfl:    acetaminophen (TYLENOL) 650 MG CR tablet, Take 650 mg by mouth every 8 (eight) hours as needed for pain., Disp: , Rfl:    amLODipine (NORVASC) 2.5 MG tablet, Take 2.5 mg by mouth daily., Disp: , Rfl:    anastrozole (ARIMIDEX) 1 MG tablet, TAKE 1 TABLET EVERY DAY, Disp: 90 tablet, Rfl: 1   Ascorbic Acid (VITAMIN C) 1000 MG tablet, Take 1,000 mg by mouth daily., Disp: , Rfl:    atorvastatin (LIPITOR) 40 MG tablet, Take 1 tablet (40 mg total) by mouth at bedtime., Disp: 90 tablet, Rfl: 0   Calcium Carbonate-Vitamin D 600-400 MG-UNIT tablet, Take 2 tablets by mouth daily., Disp: , Rfl:    chlorthalidone (HYGROTON) 25 MG tablet, Take 25 mg by mouth in the morning and at bedtime. , Disp: , Rfl: 1   cholecalciferol (VITAMIN D3) 25 MCG (1000 UNIT) tablet, Take 1,000 Units by mouth daily., Disp: , Rfl:    denosumab (PROLIA) 60 MG/ML SOSY injection, Inject 60 mg into the skin every 6 (six) months., Disp: , Rfl:    diclofenac Sodium (VOLTAREN) 1 % GEL, Apply 2 g topically 4 (four) times daily., Disp: , Rfl:    fexofenadine (ALLEGRA) 180 MG tablet, Take 180 mg by mouth daily., Disp: , Rfl:    fluticasone (FLONASE) 50 MCG/ACT nasal spray, Place 2 sprays into both nostrils daily as needed for allergies., Disp: , Rfl:    gabapentin (NEURONTIN) 300 MG capsule, Take 300 mg by  mouth at bedtime., Disp: , Rfl:    GLUCOSAMINE-CHONDROITIN PO, Take 1 tablet by mouth daily. , Disp: , Rfl:    guaifenesin (ROBITUSSIN) 100 MG/5ML syrup, Take 200 mg by mouth 3 (three) times daily as needed for cough., Disp: , Rfl:    irbesartan (AVAPRO) 300 MG tablet, TAKE 1 TABLET EVERY DAY, Disp: 90 tablet, Rfl: 1   levothyroxine (SYNTHROID) 150 MCG tablet, Take 1 tablet (150 mcg total) by mouth daily before  breakfast., Disp: 90 tablet, Rfl: 1   Multiple Vitamin (MULTIVITAMIN WITH MINERALS) TABS, Take 1 tablet by mouth daily., Disp: , Rfl:    omeprazole (PRILOSEC) 40 MG capsule, Take 1 capsule (40 mg total) by mouth daily before breakfast., Disp: 90 capsule, Rfl: 1   sertraline (ZOLOFT) 50 MG tablet, Take 1 tablet (50 mg total) by mouth daily. (Patient taking differently: Take 25 mg by mouth daily.), Disp: 90 tablet, Rfl: 0   vitamin E 180 MG (400 UNITS) capsule, Take 400 Units by mouth daily., Disp: , Rfl:   Physical exam:  Vitals:   01/22/22 1126  BP: (!) 143/93  Pulse: 69  Resp: 18  Temp: 97.9 F (36.6 C)  SpO2: 98%  Weight: 171 lb 6.4 oz (77.7 kg)   Physical Exam Cardiovascular:     Rate and Rhythm: Normal rate and regular rhythm.     Heart sounds: Normal heart sounds.  Pulmonary:     Effort: Pulmonary effort is normal.     Breath sounds: Normal breath sounds.  Abdominal:     General: Bowel sounds are normal.     Palpations: Abdomen is soft.  Skin:    General: Skin is warm and dry.  Neurological:     Mental Status: She is alert and oriented to person, place, and time.    Breast exam was performed in seated and lying down position. Patient is status post left lumpectomy with a well-healed surgical scar. No evidence of any palpable masses. No evidence of axillary adenopathy. No evidence of any palpable masses or lumps in the right breast. No evidence of right axillary adenopathy      Latest Ref Rng & Units 10/24/2021    6:47 PM  CMP  Glucose 70 - 99 mg/dL 91   BUN 8 - 23 mg/dL 26   Creatinine 0.44 - 1.00 mg/dL 1.38   Sodium 135 - 145 mmol/L 140   Potassium 3.5 - 5.1 mmol/L 3.7   Chloride 98 - 111 mmol/L 109   CO2 22 - 32 mmol/L 24   Calcium 8.9 - 10.3 mg/dL 9.3       Latest Ref Rng & Units 10/24/2021    6:47 PM  CBC  WBC 4.0 - 10.5 K/uL 8.2   Hemoglobin 12.0 - 15.0 g/dL 11.8   Hematocrit 36.0 - 46.0 % 38.6   Platelets 150 - 400 K/uL 193      Assessment and  plan- Patient is a 81 y.o. female with history of stage I left breast cancer ER/PR positive HER2 negative.  She is currently on Arimidex and this is a routine follow-up visit  Clinically patient is doing well with no concerning signs and symptoms of recurrence based on today's exam.  She will continue taking her Arimidex along with calcium and vitaminD.  Patient does have some baseline osteoporosis for which she gets Prolia through endocrinology and we will continue to monitor as well.  I will see her back in 6 months no labs   Visit Diagnosis 1. Encounter  for follow-up surveillance of breast cancer   2. Visit for monitoring Arimidex therapy   3. Osteoporosis of lumbar spine      Dr. Randa Evens, MD, MPH [facility] [phone] 01/22/2022 3:52 PM

## 2022-01-27 DIAGNOSIS — H16223 Keratoconjunctivitis sicca, not specified as Sjogren's, bilateral: Secondary | ICD-10-CM | POA: Diagnosis not present

## 2022-01-29 DIAGNOSIS — N1832 Chronic kidney disease, stage 3b: Secondary | ICD-10-CM | POA: Diagnosis not present

## 2022-01-29 DIAGNOSIS — E782 Mixed hyperlipidemia: Secondary | ICD-10-CM | POA: Diagnosis not present

## 2022-01-29 DIAGNOSIS — I10 Essential (primary) hypertension: Secondary | ICD-10-CM | POA: Diagnosis not present

## 2022-01-31 ENCOUNTER — Ambulatory Visit
Admission: RE | Admit: 2022-01-31 | Discharge: 2022-01-31 | Disposition: A | Discharge status: Home / Self Care | Payer: Medicare HMO | Source: Ambulatory Visit | Attending: Surgery | Admitting: Surgery

## 2022-01-31 DIAGNOSIS — C50312 Malignant neoplasm of 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: Secondary | ICD-10-CM | POA: Insufficient documentation

## 2022-01-31 DIAGNOSIS — Z853 Personal history of malignant neoplasm of breast: Secondary | ICD-10-CM | POA: Diagnosis not present

## 2022-02-10 DIAGNOSIS — H16223 Keratoconjunctivitis sicca, not specified as Sjogren's, bilateral: Secondary | ICD-10-CM | POA: Diagnosis not present

## 2022-02-11 ENCOUNTER — Encounter: Payer: Self-pay | Admitting: Surgery

## 2022-02-11 ENCOUNTER — Ambulatory Visit: Payer: Medicare HMO | Admitting: Surgery

## 2022-02-11 VITALS — BP 143/83 | HR 75 | Temp 98.5°F | Wt 171.0 lb

## 2022-02-11 DIAGNOSIS — Z853 Personal history of malignant neoplasm of breast: Secondary | ICD-10-CM

## 2022-02-11 NOTE — Patient Instructions (Signed)
If you have any concerns or questions, please feel free to call our office.   Breast Self-Awareness Breast self-awareness is knowing how your breasts look and feel. You need to: Check your breasts on a regular basis. Tell your doctor about any changes. Become familiar with the look and feel of your breasts. This can help you catch a breast problem while it is still small and can be treated. You should do breast self-exams even if you have breast implants. What you need: A mirror. A well-lit room. A pillow or other soft object. How to do a breast self-exam Follow these steps to do a breast self-exam: Look for changes  Take off all the clothes above your waist. Stand in front of a mirror in a room with good lighting. Put your hands down at your sides. Compare your breasts in the mirror. Look for any difference between them, such as: A difference in shape. A difference in size. Wrinkles, dips, and bumps in one breast and not the other. Look at each breast for changes in the skin, such as: Redness. Scaly areas. Skin that has gotten thicker. Dimpling. Open sores (ulcers). Look for changes in your nipples, such as: Fluid coming out of a nipple. Fluid around a nipple. Bleeding. Dimpling. Redness. A nipple that looks pushed in (retracted), or that has changed position. Feel for changes Lie on your back. Feel each breast. To do this: Pick a breast to feel. Place a pillow under the shoulder closest to that breast. Put the arm closest to that breast behind your head. Feel the nipple area of that breast using the hand of your other arm. Feel the area with the pads of your three middle fingers by making small circles with your fingers. Use light, medium, and firm pressure. Continue the overlapping circles, moving downward over the breast. Keep making circles with your fingers. Stop when you feel your ribs. Start making circles with your fingers again, this time going upward until you  reach your collarbone. Then, make circles outward across your breast and into your armpit area. Squeeze your nipple. Check for discharge and lumps. Repeat these steps to check your other breast. Sit or stand in the tub or shower. With soapy water on your skin, feel each breast the same way you did when you were lying down. Write down what you find Writing down what you find can help you remember what to tell your doctor. Write down: What is normal for each breast. Any changes you find in each breast. These include: The kind of changes you find. A tender or painful breast. Any lump you find. Write down its size and where it is. When you last had your monthly period (menstrual cycle). General tips If you are breastfeeding, the best time to check your breasts is after you feed your baby or after you use a breast pump. If you get monthly bleeding, the best time to check your breasts is 5-7 days after your monthly cycle ends. With time, you will become comfortable with the self-exam. You will also start to know if there are changes in your breasts. Contact a doctor if: You see a change in the shape or size of your breasts or nipples. You see a change in the skin of your breast or nipples, such as red or scaly skin. You have fluid coming from your nipples that is not normal. You find a new lump or thick area. You have breast pain. You have any concerns about your   breast health. Summary Breast self-awareness includes looking for changes in your breasts and feeling for changes within your breasts. You should do breast self-awareness in front of a mirror in a well-lit room. If you get monthly periods (menstrual cycles), the best time to check your breasts is 5-7 days after your period ends. Tell your doctor about any changes you see in your breasts. Changes include changes in size, changes on the skin, painful or tender breasts, or fluid from your nipples that is not normal. This information is  not intended to replace advice given to you by your health care provider. Make sure you discuss any questions you have with your health care provider. Document Revised: 08/08/2021 Document Reviewed: 01/03/2021 Elsevier Patient Education  2023 [employer]  

## 2022-02-11 NOTE — Progress Notes (Signed)
Surgical Clinic Progress/Follow-up Note   HPI:  81 y.o. Female presents to clinic for left breast cancer follow-up.  She reports no issues with her left breast.   She appears to be tolerating her Arimidex well.  On Prolia: (Denosumab blocks osteoclast activation, thereby resulting in decreased bone resorption (less bone breakdown) and modification of the release of calcium from the bone. The drug lowers serum calcium via this mechanism. )  Review of Systems:  Constitutional: denies fever/chills  ENT: denies sore throat, hearing problems  Respiratory: denies shortness of breath, wheezing  Cardiovascular: denies chest pain, palpitations  Gastrointestinal: denies abdominal pain, N/V, or diarrhea Skin: Denies any other rashes or skin discolorations  Vital Signs:  There were no vitals taken for this visit.   Physical Exam:  Constitutional:  -- Normal body habitus  -- Awake, alert, and oriented x3  Pulmonary:  -- No crackles -- Equal breath sounds bilaterally -- Breathing non-labored at rest Cardiovascular:  -- S1, S2 present  -- No pericardial rubs  Gastrointestinal:  -- Soft and non-distended, non-tender/with no tenderness to palpation, no guarding/rebound tenderness GU  --bilateral breast exam unremarkable for any skin changes, suspicious or dominant nodularity.  Her left central-areolar scar is well-healed Musculoskeletal / Integumentary:  -- Wounds or skin discoloration: None appreciated    Laboratory studies: Radiology review:   Imaging:  CLINICAL DATA:  History of left breast cancer status post lumpectomy in May of 2021.   EXAM: DIGITAL DIAGNOSTIC BILATERAL MAMMOGRAM WITH TOMOSYNTHESIS   TECHNIQUE: Bilateral digital diagnostic mammography and breast tomosynthesis was performed.   COMPARISON:  Previous exam(s).   ACR Breast Density Category c: The breast tissue is heterogeneously dense, which may obscure small masses.   FINDINGS: Stable lumpectomy changes are  seen in the left breast. No suspicious mass or malignant type microcalcifications identified.   IMPRESSION: No evidence of malignancy in either breast.   RECOMMENDATION: Bilateral screening mammogram in 1 year is recommended.   I have discussed the findings and recommendations with the patient. If applicable, a reminder letter will be sent to the patient regarding the next appointment.   BI-RADS CATEGORY  2: Benign.     Electronically Signed   By: Lillia Mountain M.D.   On: 01/31/2022 15:51   Assessment:  81 y.o. yo Female with a problem list including...  Patient Active Problem List   Diagnosis Date Noted   Generalized anxiety disorder 11/04/2021   Left leg cellulitis 10/09/2021   Dyslipidemia 10/09/2021   Essential hypertension 10/09/2021   History of breast cancer 10/09/2021   Lumbar radiculopathy 10/09/2021   Malignant neoplasm of 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 ([place]) 08/04/2019   Esophageal stenosis 01/28/2018   Varicose veins of left leg with edema 07/29/2017   Dysphagia, oropharyngeal 04/30/2017   Lumbar degenerative disc disease 04/30/2017   Spinal stenosis of lumbar region 12/08/2016   Idiopathic scoliosis of lumbar region 09/09/2016   Chronic midline low back pain with right-sided sciatica 07/28/2016   GERD without esophagitis 07/27/2015   Intermittent vertigo 07/27/2015   Osteoporosis, 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  Plan:              -  return to clinic in 1 year with follow-up imaging, or as needed, instructed to call office if any questions or concerns  -Continue her  Arimidex/Prolia      Ronny Bacon, MD, [place]: [place] [facility] exceptional care. Office: [phone]

## 2022-02-14 ENCOUNTER — Other Ambulatory Visit: Payer: Self-pay | Admitting: Internal Medicine

## 2022-02-14 DIAGNOSIS — F411 Generalized anxiety disorder: Secondary | ICD-10-CM

## 2022-03-03 ENCOUNTER — Other Ambulatory Visit: Payer: Self-pay

## 2022-03-03 MED ORDER — FLUTICASONE PROPIONATE 50 MCG/ACT NA SUSP: 2.0000 | Freq: Every day | NASAL | 0 refills | Status: DC | PRN

## 2022-03-11 ENCOUNTER — Other Ambulatory Visit: Payer: Self-pay | Admitting: Internal Medicine

## 2022-03-11 DIAGNOSIS — E039 Hypothyroidism, unspecified: Secondary | ICD-10-CM

## 2022-03-19 ENCOUNTER — Other Ambulatory Visit: Payer: Self-pay | Admitting: Oncology

## 2022-03-25 ENCOUNTER — Ambulatory Visit: Payer: Self-pay

## 2022-03-25 DIAGNOSIS — M19071 Primary osteoarthritis, right ankle and foot: Secondary | ICD-10-CM | POA: Diagnosis not present

## 2022-03-25 DIAGNOSIS — M722 Plantar fascial fibromatosis: Secondary | ICD-10-CM | POA: Diagnosis not present

## 2022-03-25 DIAGNOSIS — M9271 Juvenile osteochondrosis of metatarsus, right foot: Secondary | ICD-10-CM | POA: Diagnosis not present

## 2022-03-25 DIAGNOSIS — M79671 Pain in right foot: Secondary | ICD-10-CM | POA: Diagnosis not present

## 2022-03-25 DIAGNOSIS — M2042 Other hammer toe(s) (acquired), left foot: Secondary | ICD-10-CM | POA: Diagnosis not present

## 2022-03-25 DIAGNOSIS — M2012 Hallux valgus (acquired), left foot: Secondary | ICD-10-CM | POA: Diagnosis not present

## 2022-03-25 DIAGNOSIS — M2041 Other hammer toe(s) (acquired), right foot: Secondary | ICD-10-CM | POA: Diagnosis not present

## 2022-03-25 DIAGNOSIS — M2011 Hallux valgus (acquired), right foot: Secondary | ICD-10-CM | POA: Diagnosis not present

## 2022-03-25 NOTE — Telephone Encounter (Signed)
  Chief Complaint: skin rear and leaking serous fluid clear to yellow Symptoms: feet and ankle swollen, "little red bumps"  Frequency: yesterday  Pertinent Negatives: Patient denies fever, red streaks, chest  Disposition: '[]'$ ED /'[]'$ Urgent Care (no appt availability in office) / '[x]'$ Appointment(In office/virtual)/ '[]'$  [facility]/ '[]'$ Home Care/ '[]'$ Refused Recommended Disposition /'[]'$ [facility]/ '[]'$  Follow-up with PCP Additional Notes: Agent made appt prior to transfer tomorrow. Reason for Disposition  Looks like a boil, infected sore, deep ulcer or other infected rash (spreading redness, pus)  Answer Assessment - Initial Assessment Questions 1. ONSET: "When did the swelling start?" (e.g., minutes, hours, days)     Feet and ankles - right foot shiny 2. LOCATION: "What part of the leg is swollen?"  "Are both legs swollen or just one leg?"     Right lower leg and foot 3. SEVERITY: "How bad is the swelling?" (e.g., localized; mild, moderate, severe)   - Localized: Small area of swelling localized to one leg.   - MILD pedal edema: Swelling limited to foot and ankle, pitting edema < 1/4 inch (6 mm) deep, rest and elevation eliminate most or all swelling.   - MODERATE edema: Swelling of lower leg to knee, pitting edema > 1/4 inch (6 mm) deep, rest and elevation only partially reduce swelling.   - SEVERE edema: Swelling extends above knee, facial or hand swelling present.      moderate 4. REDNESS: "Does the swelling look red or infected?"     Red spots with thin skin leaking fluid - yellow to clear drainage 5. PAIN: "Is the swelling painful to touch?" If Yes, ask: "How painful is it?"   (Scale 1-10; mild, moderate or severe)     yes 6. FEVER: "Do you have a fever?" If Yes, ask: "What is it, how was it measured, and when did it start?"      no 7. CAUSE: "What do you think is causing the leg swelling?"     cellulitis 8. MEDICAL HISTORY: "Do you have a history of blood clots (e.g.,  DVT), cancer, heart failure, kidney disease, or liver failure?"     *No Answer* 9. RECURRENT SYMPTOM: "Have you had leg swelling before?" If Yes, ask: "When was the last time?" "What happened that time?"     Yes July 2023 and in August 2023 10. OTHER SYMPTOMS: "Do you have any other symptoms?" (e.g., chest pain, difficulty breathing)       no 11. PREGNANCY: "Is there any chance you are pregnant?" "When was your last menstrual period?"       N/a  Protocols used: Leg Swelling and Edema-A-AH

## 2022-03-26 ENCOUNTER — Ambulatory Visit (INDEPENDENT_AMBULATORY_CARE_PROVIDER_SITE_OTHER): Payer: Medicare HMO | Admitting: Internal Medicine

## 2022-03-26 ENCOUNTER — Ambulatory Visit: Payer: Medicare HMO | Admitting: Internal Medicine

## 2022-03-26 ENCOUNTER — Encounter: Payer: Self-pay | Admitting: Internal Medicine

## 2022-03-26 VITALS — BP 134/84 | HR 68 | Ht 63.0 in | Wt 172.0 lb

## 2022-03-26 DIAGNOSIS — I83892 Varicose veins of left lower extremities with other complications: Secondary | ICD-10-CM

## 2022-03-26 DIAGNOSIS — M722 Plantar fascial fibromatosis: Secondary | ICD-10-CM | POA: Diagnosis not present

## 2022-03-26 NOTE — Assessment & Plan Note (Signed)
Seen by podiatry; has a wrap and exercises to perform

## 2022-03-26 NOTE — Progress Notes (Signed)
Date:  03/26/2022   Name:  Latoya Stevenson   DOB:  1940/11/09   MRN:  [id number]   Chief Complaint: Leg Pain  Leg Pain  Incident onset: X1 month. The injury mechanism was a direct blow. The pain is present in the right leg. The pain is at a severity of 4/10. The pain is mild. Pain course: comes and goes. Associated symptoms comments: swelling. The symptoms are aggravated by movement and weight bearing. She has tried immobilization and ice for the symptoms. The treatment provided mild relief.    Lab Results  Component Value Date   NA 140 10/24/2021   K 3.7 10/24/2021   CO2 24 10/24/2021   GLUCOSE 91 10/24/2021   BUN 26 (H) 10/24/2021   CREATININE 1.38 (H) 10/24/2021   CALCIUM 9.3 10/24/2021   EGFR 43 (L) 10/07/2021   GFRNONAA 39 (L) 10/24/2021   Lab Results  Component Value Date   CHOL 154 10/07/2021   HDL 55 10/07/2021   LDLCALC 85 10/07/2021   TRIG 70 10/07/2021   CHOLHDL 2.8 10/07/2021   Lab Results  Component Value Date   TSH 0.037 (L) 10/07/2021   No results found for: "HGBA1C" Lab Results  Component Value Date   WBC 8.2 10/24/2021   HGB 11.8 (L) 10/24/2021   HCT 38.6 10/24/2021   MCV 92.3 10/24/2021   PLT 193 10/24/2021   Lab Results  Component Value Date   ALT 16 10/07/2021   AST 17 10/07/2021   ALKPHOS 76 10/07/2021   BILITOT 0.7 10/07/2021   Lab Results  Component Value Date   VD25OH 32.16 01/21/2021     Review of Systems  Constitutional:  Negative for chills, fatigue and fever.  Respiratory:  Negative for chest tightness and shortness of breath.   Cardiovascular:  Positive for leg swelling. Negative for chest pain.  Skin:  Positive for wound.    Patient Active Problem List   Diagnosis Date Noted   Plantar fasciitis of right foot 03/26/2022   Generalized anxiety disorder 11/04/2021   Left leg cellulitis 10/09/2021   Dyslipidemia 10/09/2021   Essential hypertension 10/09/2021   History of breast cancer 10/09/2021   Lumbar radiculopathy  10/09/2021   Malignant neoplasm of 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 ([place]) 08/04/2019   Esophageal stenosis 01/28/2018   Varicose veins of left leg with edema 07/29/2017   Dysphagia, oropharyngeal 04/30/2017   Lumbar degenerative disc disease 04/30/2017   Spinal stenosis of lumbar region 12/08/2016   Idiopathic scoliosis of lumbar region 09/09/2016   Chronic midline low back pain with right-sided sciatica 07/28/2016   GERD without esophagitis 07/27/2015   Intermittent vertigo 07/27/2015   Osteoporosis, senile 07/27/2015   Seasonal allergic rhinitis due to pollen 07/27/2015   Acquired hypothyroidism 01/05/2015   Benign essential hypertension 06/29/2014   Mixed hyperlipidemia 06/29/2014   Stage 3b chronic kidney disease ([place]) 06/29/2014   Insomnia 11/11/2013   Mild reactive airways disease 11/11/2013    Allergies  Allergen Reactions   Flagyl [Metronidazole Hcl] Swelling    Swelling in face, mouth, throat, palms of feet and hands.   Furosemide Itching    Made side of mouth irritated and puffy.  The rim inside her lip swelled up   Macrodantin [Nitrofurantoin Macrocrystal] Anaphylaxis, Hives and Swelling   Penicillins Anaphylaxis and Swelling    PATIENT HAD A PCN REACTION WITH IMMEDIATE RASH, FACIAL/TONGUE/THROAT SWELLING, SOB, OR LIGHTHEADEDNESS WITH HYPOTENSION:  #  #  #  YES  #  #  #  Has patient had a PCN reaction causing severe rash involving mucus membranes or skin necrosis: No Has patient had a PCN reaction that required hospitalization: No Has patient had a PCN reaction occurring within the last 10 years: No If all of the above answers are "NO", then may proceed with Cephalosporin use.    Celebrex [Celecoxib] Swelling    Swelling in joints.  Did not agree with her. Did not help her either   Other     Pt says it was recently discovered she allergic to an ingredient in local anesthesia but she can't recall the name. (epinephrine and the additive used for the biopsy.  Gets very nervous. Feels internally shaking. 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 Tito Dine FRACTURE SURGERY Left 2003   arch of foot.  no metal. per patient, she did not have surgery for this fracture. healed on its own   ANTERIOR LAT LUMBAR FUSION Left 09/09/2016   Procedure: Left Lumbar two-three Anterolateral lumbar interbody fusion with lateral plate;  Surgeon: Erline Levine, MD;  Location: [facility];  Service: Neurosurgery;  Laterality: Left;  Left L2-3 Anterolateral lumbar interbody fusion with lateral plate   APPENDECTOMY     BACK SURGERY  2013, 2016, 2018   lumbar lam, fusion, discectomy.titanium   BIOPSY THYROID     x2  thyroid nodule ; Dr. Sharlet Salina and one by Dr. Sabra Heck   BREAST BIOPSY Left 08/02/2019   coil clip-INVASIVE MAMMARY CARCINOMA   BREAST CYST ASPIRATION  1970's   BREAST LUMPECTOMY Left 2021   BREAST LUMPECTOMY WITH RADIOFREQUENCY TAG IDENTIFICATION Left 10/09/3662   Procedure: BREAST LUMPECTOMY WITH RADIOFREQUENCY TAG IDENTIFICATION;  Surgeon: Ronny Bacon, MD;  Location: [facility];  Service: General;  Laterality: Left;   BREAST LUMPECTOMY WITH SENTINEL LYMPH NODE BIOPSY Left 08/12/2019   Procedure: BREAST LUMPECTOMY WITH SENTINEL LYMPH NODE BX;  Surgeon: Ronny Bacon, MD;  Location: [facility];  Service: General;  Laterality: Left;   broken arm Left 1994   wrist fracture surgery, left.  metal has been replaced.   CARDIOVASCULAR STRESS TEST     2010   COLONOSCOPY  2010   COLONOSCOPY  07/21/2013   PHX OF CP/REPEAT 33YRS/[place]   ESOPHAGOGASTRODUODENOSCOPY (EGD) WITH PROPOFOL N/A 09/22/2018   Procedure: ESOPHAGOGASTRODUODENOSCOPY (EGD) WITH PROPOFOL;  Surgeon: Toledo, Benay Pike, MD;  Location: [facility];  Service: Gastroenterology;  Laterality: N/A;  Patient to have Rapid COVID-19 day of procedure Kieth Brightly approved this)   EYE SURGERY Bilateral     cataract surgery with lens implant   FOREIGN BODY REMOVAL N/A 12/09/2019   Procedure: FOREIGN BODY REMOVAL ADULT;  Surgeon: Hessie Knows, MD;  Location: [facility];  Service: Orthopedics;  Laterality: N/A;   INCISION AND DRAINAGE Left 12/09/2019   Procedure: INCISION AND DRAINAGE;  Surgeon: Hessie Knows, MD;  Location: [facility];  Service: Orthopedics;  Laterality: Left;   LACERATION REPAIR Bilateral 12/09/2019   Procedure: REPAIR MULTIPLE LACERATIONS;  Surgeon: Hessie Knows, MD;  Location: [facility];  Service: Orthopedics;  Laterality: Bilateral;   LUMBAR LAMINECTOMY/DECOMPRESSION MICRODISCECTOMY  11/18/2011   Procedure: LUMBAR LAMINECTOMY/DECOMPRESSION MICRODISCECTOMY 2 LEVELS;  Surgeon: Erline Levine, MD;  Location: [facility];  Service: Neurosurgery;  Laterality: N/A;  Lumbar Three-Four,  Lumbar Four-Five Decompressive Laminectomy   MALONEY DILATION N/A 09/22/2018   Procedure: MALONEY DILATION;  Surgeon: Ennis, Shelby,  MD;  Location: [facility];  Service: Gastroenterology;  Laterality: N/A;   OVARIAN CYST SURGERY     POSTERIOR FUSION LUMBAR SPINE  2016   TOTAL THYROIDECTOMY     TRANSTHORACIC ECHOCARDIOGRAM  2010   upper and lower GI  2010    Social History   Tobacco Use   Smoking status: Never   Smokeless tobacco: Never  Vaping Use   Vaping Use: Never used  Substance Use Topics   Alcohol use: Not Currently    Comment: socially-rare   Drug use: No     Medication list has been reviewed and updated.  Current Meds  Medication Sig   acetaminophen (TYLENOL) 650 MG CR tablet Take 1,300 mg by mouth every 8 (eight) hours as needed for pain.   acetaminophen (TYLENOL) 650 MG CR tablet Take 650 mg by mouth every 8 (eight) hours as needed for pain.   amLODipine (NORVASC) 2.5 MG tablet Take 2.5 mg by mouth daily.   anastrozole (ARIMIDEX) 1 MG tablet TAKE 1 TABLET EVERY DAY   Ascorbic Acid (VITAMIN C) 1000 MG tablet Take 1,000 mg by mouth daily.   atorvastatin (LIPITOR) 40 MG  tablet Take 1 tablet (40 mg total) by mouth at bedtime.   Calcium Carbonate-Vitamin D 600-400 MG-UNIT tablet Take 2 tablets by mouth daily.   chlorthalidone (HYGROTON) 25 MG tablet Take 25 mg by mouth in the morning and at bedtime.    cholecalciferol (VITAMIN D3) 25 MCG (1000 UNIT) tablet Take 1,000 Units by mouth daily.   denosumab (PROLIA) 60 MG/ML SOSY injection Inject 60 mg into the skin every 6 (six) months.   diclofenac Sodium (VOLTAREN) 1 % GEL Apply 2 g topically 4 (four) times daily.   fexofenadine (ALLEGRA) 180 MG tablet Take 180 mg by mouth daily.   fluticasone (FLONASE) 50 MCG/ACT nasal spray Place 2 sprays into both nostrils daily as needed for allergies.   gabapentin (NEURONTIN) 300 MG capsule Take 300 mg by mouth at bedtime.   GLUCOSAMINE-CHONDROITIN PO Take 1 tablet by mouth daily.    guaifenesin (ROBITUSSIN) 100 MG/5ML syrup Take 200 mg by mouth 3 (three) times daily as needed for cough.   irbesartan (AVAPRO) 300 MG tablet TAKE 1 TABLET EVERY DAY   levothyroxine (SYNTHROID) 150 MCG tablet TAKE 1 TABLET EVERY DAY BEFORE BREAKFAST   Multiple Vitamin (MULTIVITAMIN WITH MINERALS) TABS Take 1 tablet by mouth daily.   neomycin-polymyxin b-dexamethasone (MAXITROL) 3.5-10000-0.1 SUSP 1 drop 2 (two) times daily.   omeprazole (PRILOSEC) 40 MG capsule Take 1 capsule (40 mg total) by mouth daily before breakfast.   sertraline (ZOLOFT) 50 MG tablet TAKE 1 TABLET EVERY DAY   vitamin E 180 MG (400 UNITS) capsule Take 400 Units by mouth daily.       03/26/2022    2:38 PM 01/01/2022    3:38 PM 11/15/2021   10:56 AM 11/04/2021   10:21 AM  GAD 7 : Generalized Anxiety Score  Nervous, Anxious, on Edge 0 0 0 3  Control/stop worrying 0 0 0 0  Worry too much - different things 0 0 0 3  Trouble relaxing 0 0 0 3  Restless 0 0 0 0  Easily annoyed or irritable 0 0 0 0  Afraid - awful might happen 0 0 0 0  Total GAD 7 Score 0 0 0 9  Anxiety Difficulty Not difficult at all Not difficult at all Not  difficult at all Extremely difficult       03/26/2022  2:37 PM 01/01/2022    3:13 PM 11/15/2021   10:56 AM  Depression screen PHQ 2/9  Decreased Interest 0 0 0  Down, Depressed, Hopeless 0 0 0  PHQ - 2 Score 0 0 0  Altered sleeping 2 0 0  Tired, decreased energy 3 0 3  Change in appetite 0 0 0  Feeling bad or failure about yourself  0 0 0  Trouble concentrating 0 0 0  Moving slowly or fidgety/restless 0 0 0  Suicidal thoughts 0 0 0  PHQ-9 Score 5 0 3  Difficult doing work/chores Not difficult at all Not difficult at all Not difficult at all    BP Readings from Last 3 Encounters:  03/26/22 134/84  02/11/22 (!) 143/83  01/22/22 (!) 143/93    Physical Exam Vitals and nursing note reviewed.  Constitutional:      General: She is not in acute distress.    Appearance: She is well-developed.  HENT:     Head: Normocephalic and atraumatic.  Neck:     Vascular: No carotid bruit.  Cardiovascular:     Rate and Rhythm: Normal rate and regular rhythm.  Pulmonary:     Effort: Pulmonary effort is normal. No respiratory distress.     Breath sounds: No wheezing or rhonchi.  Musculoskeletal:        General: Swelling present.     Cervical back: Normal range of motion.     Right lower leg: Edema present.     Left lower leg: Edema present.  Lymphadenopathy:     Cervical: No cervical adenopathy.  Skin:    General: Skin is warm and dry.     Findings: No rash.     Comments: Mild soft tissues swelling L>R Small anterior skin tear mid right calf - healing without surrounding erythema.  Lower extremity generally tender.  Neurological:     Mental Status: She is alert and oriented to person, place, and time.  Psychiatric:        Mood and Affect: Mood normal.        Behavior: Behavior normal.     Wt Readings from Last 3 Encounters:  03/26/22 172 lb (78 kg)  02/11/22 171 lb (77.6 kg)  01/22/22 171 lb 6.4 oz (77.7 kg)    BP 134/84 (BP Location: Left Arm, Cuff Size: Normal)   Pulse  68   Ht '5\' 3"'$  (1.6 m)   Wt 172 lb (78 kg)   SpO2 97%   BMI 30.47 kg/m   Assessment and Plan: Problem List Items Addressed This Visit       Cardiovascular and Mediastinum   Varicose veins of left leg with edema - Primary     A small skin tear that has been weeping The tear is healing with no evidence of infection. Recommend TAO locally and elevation        Musculoskeletal and Integument   Plantar fasciitis of right foot    Seen by podiatry; has a wrap and exercises to perform        Partially dictated using Editor, commissioning. Any errors are unintentional.  [name], MD [facility]  03/26/2022

## 2022-03-26 NOTE — Assessment & Plan Note (Signed)
A small skin tear that has been weeping The tear is healing with no evidence of infection. Recommend TAO locally and elevation

## 2022-03-31 ENCOUNTER — Other Ambulatory Visit: Payer: Self-pay | Admitting: Internal Medicine

## 2022-04-09 ENCOUNTER — Ambulatory Visit (INDEPENDENT_AMBULATORY_CARE_PROVIDER_SITE_OTHER): Payer: Medicare HMO | Admitting: Internal Medicine

## 2022-04-09 ENCOUNTER — Encounter: Payer: Self-pay | Admitting: Internal Medicine

## 2022-04-09 VITALS — BP 126/74 | HR 50 | Ht 63.0 in | Wt 175.4 lb

## 2022-04-09 DIAGNOSIS — C50312 Malignant neoplasm of 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: Secondary | ICD-10-CM

## 2022-04-09 DIAGNOSIS — J3489 Other specified disorders of nose and nasal sinuses: Secondary | ICD-10-CM

## 2022-04-09 DIAGNOSIS — I10 Essential (primary) hypertension: Secondary | ICD-10-CM | POA: Diagnosis not present

## 2022-04-09 DIAGNOSIS — J0101 Acute recurrent maxillary sinusitis: Secondary | ICD-10-CM

## 2022-04-09 DIAGNOSIS — F411 Generalized anxiety disorder: Secondary | ICD-10-CM

## 2022-04-09 DIAGNOSIS — N1832 Chronic kidney disease, stage 3b: Secondary | ICD-10-CM | POA: Diagnosis not present

## 2022-04-09 MED ORDER — AZITHROMYCIN 250 MG PO TABS: ORAL_TABLET | ORAL | 0 refills | Status: AC

## 2022-04-09 NOTE — Assessment & Plan Note (Addendum)
Clinically stable exam with well controlled BP on amlodipine and Avapro. Tolerating medications without side effects. Pt to continue current regimen and low sodium diet.

## 2022-04-09 NOTE — Assessment & Plan Note (Signed)
Most recent mammogram was normal. No new concerns

## 2022-04-09 NOTE — Assessment & Plan Note (Signed)
Clinically stable on current regimen with good control of symptoms, No SI or HI. No change in management at this time. Zoloft daily

## 2022-04-09 NOTE — Progress Notes (Signed)
Date:  04/09/2022   Name:  Latoya Stevenson   DOB:  [birth year]   MRN:  [id number]   Chief Complaint: Hypertension  Hypertension This is a chronic problem. The problem is controlled. Associated symptoms include anxiety. Pertinent negatives include no chest pain, headaches, palpitations or shortness of breath. Past treatments include calcium channel blockers and angiotensin blockers. The current treatment provides significant improvement. Hypertensive end-organ damage includes kidney disease. There is no history of CAD/MI or CVA.  Anxiety Presents for follow-up visit. Patient reports no chest pain, dizziness, nervous/anxious behavior, palpitations or shortness of breath. Symptoms occur occasionally. The quality of sleep is good.   Compliance with medications is 76-100%.  Nose bleed/nasal sore - on the right side for several weeks, occasional bloody mucus and left sided maxillary tenderness.  No fever, ear pain, cough.   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   Lab Results  Component Value Date   NA 140 10/24/2021   K 3.7 10/24/2021   CO2 24 10/24/2021   GLUCOSE 91 10/24/2021   BUN 26 (H) 10/24/2021   CREATININE 1.38 (H) 10/24/2021   CALCIUM 9.3 10/24/2021   EGFR 43 (L) 10/07/2021   GFRNONAA 39 (L) 10/24/2021   Lab Results  Component Value Date   CHOL 154 10/07/2021   HDL 55 10/07/2021   LDLCALC 85 10/07/2021   TRIG 70 10/07/2021   CHOLHDL 2.8 10/07/2021   Lab Results  Component Value Date   TSH 0.037 (L) 10/07/2021   No results found for: "HGBA1C" Lab Results  Component Value Date   WBC 8.2 10/24/2021   HGB 11.8 (L) 10/24/2021   HCT 38.6 10/24/2021   MCV 92.3 10/24/2021   PLT 193 10/24/2021   Lab Results  Component Value Date   ALT 16 10/07/2021   AST 17 10/07/2021   ALKPHOS 76 10/07/2021   BILITOT 0.7 10/07/2021   Lab Results  Component Value Date   VD25OH 32.16 01/21/2021     Review of Systems  Constitutional:  Negative for fatigue  and unexpected weight change.  HENT:  Positive for congestion, nosebleeds (and right sided nasal sore), sinus pressure and sinus pain.   Eyes:  Negative for visual disturbance.  Respiratory:  Negative for cough, chest tightness, shortness of breath and wheezing.   Cardiovascular:  Positive for leg swelling (off and on). Negative for chest pain and palpitations.  Gastrointestinal:  Negative for abdominal pain, constipation and diarrhea.  Skin:  Negative for color change and rash.  Neurological:  Negative for dizziness, weakness, light-headedness and headaches.  Psychiatric/Behavioral:  Negative for dysphoric mood and sleep disturbance. The patient is not nervous/anxious.     Patient Active Problem List   Diagnosis Date Noted   Plantar fasciitis of right foot 03/26/2022   Generalized anxiety disorder 11/04/2021   Left leg cellulitis 10/09/2021   Dyslipidemia 10/09/2021   History of breast cancer 10/09/2021   Lumbar radiculopathy 10/09/2021   Malignant neoplasm of 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 ([place]) 08/04/2019   Esophageal stenosis 01/28/2018   Varicose veins of left leg with edema 07/29/2017   Dysphagia, oropharyngeal 04/30/2017   Lumbar degenerative disc disease 04/30/2017   Spinal stenosis of lumbar region 12/08/2016   Idiopathic scoliosis of lumbar region 09/09/2016   Chronic midline low back pain with right-sided sciatica 07/28/2016   GERD without esophagitis 07/27/2015   Intermittent vertigo 07/27/2015   Osteoporosis, senile 07/27/2015   Seasonal allergic rhinitis due to pollen 07/27/2015   Acquired  hypothyroidism 01/05/2015   Benign essential hypertension 06/29/2014   Mixed hyperlipidemia 06/29/2014   Stage 3b chronic kidney disease ([place]) 06/29/2014   Insomnia 11/11/2013   Mild reactive airways disease 11/11/2013    Allergies  Allergen Reactions   Flagyl [Metronidazole Hcl] Swelling    Swelling in face, mouth, throat, palms of feet and hands.   Furosemide  Itching    Made side of mouth irritated and puffy.  The rim inside her lip swelled up   Macrodantin [Nitrofurantoin Macrocrystal] Anaphylaxis, Hives and Swelling   Penicillins Anaphylaxis and Swelling    PATIENT HAD A PCN REACTION WITH IMMEDIATE RASH, FACIAL/TONGUE/THROAT SWELLING, SOB, OR LIGHTHEADEDNESS WITH HYPOTENSION:  #  #  #  YES  #  #  #   Has patient had a PCN reaction causing severe rash involving mucus membranes or skin necrosis: No Has patient had a PCN reaction that required hospitalization: No Has patient had a PCN reaction occurring within the last 10 years: No If all of the above answers are "NO", then may proceed with Cephalosporin use.    Celebrex [Celecoxib] Swelling    Swelling in joints.  Did not agree with her. Did not help her either   Other     Pt says it was recently discovered she allergic to an ingredient in local anesthesia but she can't recall the name. (epinephrine and the additive used for the biopsy. Gets very nervous. Feels internally shaking. 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 Tito Dine FRACTURE SURGERY Left 2003   arch of foot.  no metal. per patient, she did not have surgery for this fracture. healed on its own   ANTERIOR LAT LUMBAR FUSION Left 09/09/2016   Procedure: Left Lumbar two-three Anterolateral lumbar interbody fusion with lateral plate;  Surgeon: Erline Levine, MD;  Location: [facility];  Service: Neurosurgery;  Laterality: Left;  Left L2-3 Anterolateral lumbar interbody fusion with lateral plate   APPENDECTOMY     BACK SURGERY  2013, 2016, 2018   lumbar lam, fusion, discectomy.titanium   BIOPSY THYROID     x2  thyroid nodule ; Dr. Sharlet Salina and one by Dr. Sabra Heck   BREAST BIOPSY Left 08/02/2019   coil clip-INVASIVE MAMMARY CARCINOMA   BREAST CYST ASPIRATION  1970's   BREAST LUMPECTOMY Left 2021   BREAST LUMPECTOMY  WITH RADIOFREQUENCY TAG IDENTIFICATION Left 9/89/2119   Procedure: BREAST LUMPECTOMY WITH RADIOFREQUENCY TAG IDENTIFICATION;  Surgeon: Ronny Bacon, MD;  Location: [facility];  Service: General;  Laterality: Left;   BREAST LUMPECTOMY WITH SENTINEL LYMPH NODE BIOPSY Left 08/12/2019   Procedure: BREAST LUMPECTOMY WITH SENTINEL LYMPH NODE BX;  Surgeon: Ronny Bacon, MD;  Location: [facility];  Service: General;  Laterality: Left;   broken arm Left 1994   wrist fracture surgery, left.  metal has been replaced.   CARDIOVASCULAR STRESS TEST     2010   COLONOSCOPY  2010   COLONOSCOPY  07/21/2013   PHX OF CP/REPEAT 76YRS/[place]   ESOPHAGOGASTRODUODENOSCOPY (EGD) WITH PROPOFOL N/A 09/22/2018   Procedure: ESOPHAGOGASTRODUODENOSCOPY (EGD) WITH PROPOFOL;  Surgeon: Toledo, Benay Pike, MD;  Location: [facility];  Service: Gastroenterology;  Laterality: N/A;  Patient to have Rapid COVID-19 day of procedure Kieth Brightly approved this)   EYE SURGERY Bilateral    cataract surgery with lens implant   FOREIGN BODY REMOVAL N/A 12/09/2019  Procedure: FOREIGN BODY REMOVAL ADULT;  Surgeon: Hessie Knows, MD;  Location: [facility];  Service: Orthopedics;  Laterality: N/A;   INCISION AND DRAINAGE Left 12/09/2019   Procedure: INCISION AND DRAINAGE;  Surgeon: Hessie Knows, MD;  Location: [facility];  Service: Orthopedics;  Laterality: Left;   LACERATION REPAIR Bilateral 12/09/2019   Procedure: REPAIR MULTIPLE LACERATIONS;  Surgeon: Hessie Knows, MD;  Location: [facility];  Service: Orthopedics;  Laterality: Bilateral;   LUMBAR LAMINECTOMY/DECOMPRESSION MICRODISCECTOMY  11/18/2011   Procedure: LUMBAR LAMINECTOMY/DECOMPRESSION MICRODISCECTOMY 2 LEVELS;  Surgeon: Erline Levine, MD;  Location: [facility];  Service: Neurosurgery;  Laterality: N/A;  Lumbar Three-Four,  Lumbar Four-Five Decompressive Laminectomy   MALONEY DILATION N/A 09/22/2018   Procedure: MALONEY DILATION;  Surgeon: Toledo, Benay Pike, MD;  Location: [facility];   Service: Gastroenterology;  Laterality: N/A;   OVARIAN CYST SURGERY     POSTERIOR FUSION LUMBAR SPINE  2016   TOTAL THYROIDECTOMY     TRANSTHORACIC ECHOCARDIOGRAM  2010   upper and lower GI  2010    Social History   Tobacco Use   Smoking status: Never   Smokeless tobacco: Never  Vaping Use   Vaping Use: Never used  Substance Use Topics   Alcohol use: Not Currently    Comment: socially-rare   Drug use: No     Medication list has been reviewed and updated.  Current Meds  Medication Sig   acetaminophen (TYLENOL) 650 MG CR tablet Take 1,300 mg by mouth every 8 (eight) hours as needed for pain.   amLODipine (NORVASC) 2.5 MG tablet Take 2.5 mg by mouth daily.   anastrozole (ARIMIDEX) 1 MG tablet TAKE 1 TABLET EVERY DAY   Ascorbic Acid (VITAMIN C) 1000 MG tablet Take 1,000 mg by mouth daily.   atorvastatin (LIPITOR) 40 MG tablet Take 1 tablet (40 mg total) by mouth at bedtime.   azithromycin (ZITHROMAX Z-PAK) 250 MG tablet UAD   Calcium Carbonate-Vitamin D 600-400 MG-UNIT tablet Take 2 tablets by mouth daily.   chlorthalidone (HYGROTON) 25 MG tablet Take 25 mg by mouth in the morning and at bedtime.    cholecalciferol (VITAMIN D3) 25 MCG (1000 UNIT) tablet Take 1,000 Units by mouth daily.   denosumab (PROLIA) 60 MG/ML SOSY injection Inject 60 mg into the skin every 6 (six) months.   diclofenac Sodium (VOLTAREN) 1 % GEL Apply 2 g topically 4 (four) times daily.   fexofenadine (ALLEGRA) 180 MG tablet Take 180 mg by mouth daily.   fluticasone (FLONASE) 50 MCG/ACT nasal spray USE 2 SPRAYS INTO BOTH NOSTRILS DAILY AS NEEDED FOR ALLERGIES.   gabapentin (NEURONTIN) 300 MG capsule Take 300 mg by mouth at bedtime.   GLUCOSAMINE-CHONDROITIN PO Take 1 tablet by mouth daily.    irbesartan (AVAPRO) 300 MG tablet TAKE 1 TABLET EVERY DAY   levothyroxine (SYNTHROID) 150 MCG tablet TAKE 1 TABLET EVERY DAY BEFORE BREAKFAST   Multiple Vitamin (MULTIVITAMIN WITH MINERALS) TABS Take 1 tablet by mouth  daily.   omeprazole (PRILOSEC) 40 MG capsule Take 1 capsule (40 mg total) by mouth daily before breakfast.   sertraline (ZOLOFT) 50 MG tablet TAKE 1 TABLET EVERY DAY   vitamin E 180 MG (400 UNITS) capsule Take 400 Units by mouth daily.       04/09/2022   10:04 AM 03/26/2022    2:38 PM 01/01/2022    3:38 PM 11/15/2021   10:56 AM  GAD 7 : Generalized Anxiety Score  Nervous, Anxious, on Edge 0 0 0 0  Control/stop worrying 0 0 0 0  Worry too much - different things 0 0 0 0  Trouble relaxing 0 0 0 0  Restless 0 0 0 0  Easily annoyed or irritable 0 0 0 0  Afraid - awful might happen 0 0 0 0  Total GAD 7 Score 0 0 0 0  Anxiety Difficulty Not difficult at all Not difficult at all Not difficult at all Not difficult at all       04/09/2022   10:04 AM 03/26/2022    2:37 PM 01/01/2022    3:13 PM  Depression screen PHQ 2/9  Decreased Interest 0 0 0  Down, Depressed, Hopeless 0 0 0  PHQ - 2 Score 0 0 0  Altered sleeping 0 2 0  Tired, decreased energy 0 3 0  Change in appetite 0 0 0  Feeling bad or failure about yourself  0 0 0  Trouble concentrating 0 0 0  Moving slowly or fidgety/restless 0 0 0  Suicidal thoughts 0 0 0  PHQ-9 Score 0 5 0  Difficult doing work/chores Not difficult at all Not difficult at all Not difficult at all    BP Readings from Last 3 Encounters:  04/09/22 126/74  03/26/22 134/84  02/11/22 (!) 143/83    Physical Exam Vitals and nursing note reviewed.  Constitutional:      General: She is not in acute distress.    Appearance: She is well-developed.  HENT:     Head: Normocephalic and atraumatic.     Right Ear: Tympanic membrane normal.     Left Ear: Tympanic membrane normal.     Nose: Mucosal edema (and adherent blood both sides) present.     Left Sinus: Maxillary sinus tenderness present.  Cardiovascular:     Rate and Rhythm: Normal rate and regular rhythm.     Heart sounds: Normal heart sounds.  Pulmonary:     Effort: Pulmonary effort is normal. No  respiratory distress.     Breath sounds: No wheezing or rhonchi.  Musculoskeletal:        General: Swelling present.     Cervical back: Normal range of motion.     Right lower leg: Edema present.     Left lower leg: Edema present.  Lymphadenopathy:     Cervical: No cervical adenopathy.  Skin:    General: Skin is warm and dry.     Capillary Refill: Capillary refill takes less than 2 seconds.     Findings: No rash.  Neurological:     General: No focal deficit present.     Mental Status: She is alert and oriented to person, place, and time.  Psychiatric:        Mood and Affect: Mood normal.        Behavior: Behavior normal.     Wt Readings from Last 3 Encounters:  04/09/22 175 lb 6.4 oz (79.6 kg)  03/26/22 172 lb (78 kg)  02/11/22 171 lb (77.6 kg)    BP 126/74   Pulse (!) 50   Ht '5\' 3"'$  (1.6 m)   Wt 175 lb 6.4 oz (79.6 kg)   SpO2 96%   BMI 31.07 kg/m   Assessment and Plan: Problem List Items Addressed This Visit       Cardiovascular and Mediastinum   Benign essential hypertension - Primary (Chronic)    Clinically stable exam with well controlled BP on amlodipine and Avapro. Tolerating medications without side effects. Pt to continue current regimen and low sodium  diet.         Genitourinary   Stage 3b chronic kidney disease (HCC) (Chronic)    GFR stable around 43       Relevant Orders   Basic metabolic panel   Parathyroid hormone, intact (no Ca)   Phosphorus   VITAMIN D 25 Hydroxy (Vit-D Deficiency, Fractures)     Other   Generalized anxiety disorder (Chronic)    Clinically stable on current regimen with good control of symptoms, No SI or HI. No change in management at this time. Zoloft daily      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) (Chronic)    Most recent mammogram was normal. No new concerns      Relevant Medications   azithromycin (ZITHROMAX Z-PAK) 250 MG tablet   Other Visit Diagnoses     Acute recurrent  maxillary sinusitis       Relevant Medications   azithromycin (ZITHROMAX Z-PAK) 250 MG tablet   Internal nasal lesion       Relevant Orders   Ambulatory referral to ENT        Partially dictated using Dragon software. Any errors are unintentional.  [name], MD [facility]  04/09/2022

## 2022-04-09 NOTE — Assessment & Plan Note (Signed)
GFR stable around 43

## 2022-04-10 LAB — BASIC METABOLIC PANEL
BUN/Creatinine Ratio: 20 (ref 12–28)
BUN: 26 mg/dL (ref 8–27)
CO2: 21 mmol/L (ref 20–29)
Calcium: 9.6 mg/dL (ref 8.7–10.3)
Chloride: 105 mmol/L (ref 96–106)
Creatinine, Ser: 1.33 mg/dL — ABNORMAL HIGH (ref 0.57–1.00)
Glucose: 84 mg/dL (ref 70–99)
Potassium: 3.7 mmol/L (ref 3.5–5.2)
Sodium: 142 mmol/L (ref 134–144)
eGFR: 40 mL/min/{1.73_m2} — ABNORMAL LOW (ref >59)

## 2022-04-10 LAB — VITAMIN D 25 HYDROXY (VIT D DEFICIENCY, FRACTURES): Vit D, 25-Hydroxy: 48.9 ng/mL (ref 30.0–100.0)

## 2022-04-10 LAB — PHOSPHORUS: Phosphorus: 3.7 mg/dL (ref 3.0–4.3)

## 2022-04-10 LAB — PARATHYROID HORMONE, INTACT (NO CA): PTH: 26 pg/mL (ref 15–65)

## 2022-04-29 ENCOUNTER — Other Ambulatory Visit: Payer: Self-pay | Admitting: Internal Medicine

## 2022-04-29 DIAGNOSIS — E782 Mixed hyperlipidemia: Secondary | ICD-10-CM

## 2022-04-29 DIAGNOSIS — F411 Generalized anxiety disorder: Secondary | ICD-10-CM

## 2022-04-29 DIAGNOSIS — K219 Gastro-esophageal reflux disease without esophagitis: Secondary | ICD-10-CM

## 2022-04-30 NOTE — Telephone Encounter (Signed)
Requested Prescriptions  Pending Prescriptions Disp Refills   atorvastatin (LIPITOR) 40 MG tablet [Pharmacy Med Name: ATORVASTATIN CALCIUM 40 MG Tablet] 90 tablet 0    Sig: TAKE 1 TABLET AT BEDTIME     Cardiovascular:  Antilipid - Statins Failed - 04/29/2022 12:18 PM      Failed - Lipid Panel in normal range within the last 12 months    Cholesterol, Total  Date Value Ref Range Status  10/07/2021 154 100 - 199 mg/dL Final   LDL Chol Calc (NIH)  Date Value Ref Range Status  10/07/2021 85 0 - 99 mg/dL Final   HDL  Date Value Ref Range Status  10/07/2021 55 >39 mg/dL Final   Triglycerides  Date Value Ref Range Status  10/07/2021 70 0 - 149 mg/dL Final         Passed - Patient is not pregnant      Passed - Valid encounter within last 12 months    Recent Outpatient Visits           3 weeks ago Benign essential hypertension   [place] [facility], Jesse Sans, MD   1 month ago Varicose veins of left leg with edema   [place] [facility], Jesse Sans, MD   5 months ago Left leg cellulitis   [place] at [facility], Jesse Sans, MD   5 months ago Left leg cellulitis   [place] at [facility], Jesse Sans, MD   6 months ago Left leg cellulitis   [place] at [facility], Jesse Sans, MD       Future Appointments             In 5 months Army Melia Jesse Sans, MD [place] at [place], [place]             sertraline (ZOLOFT) 50 MG tablet [Pharmacy Med Name: SERTRALINE HCL 50 MG Tablet] 90 tablet 0    Sig: TAKE 1 [place]     Psychiatry:  Antidepressants - SSRI - sertraline Passed - 04/29/2022 12:18 PM      Passed - AST in normal range and within 360 days    AST  Date Value Ref Range Status  10/07/2021 17 0  - 40 IU/L Final         Passed - ALT in normal range and within 360 days    ALT  Date Value Ref Range Status  10/07/2021 16 0 - 32 IU/L Final         Passed - Completed PHQ-2 or PHQ-9 in the last 360 days      Passed - Valid encounter within last 6 months    Recent Outpatient Visits           3 weeks ago Benign essential hypertension   [place] [facility] [place] [facility], Jesse Sans, MD   1 month ago Varicose veins of left leg with edema   [place] at [facility], Jesse Sans, MD   5 months ago Left leg cellulitis   [place] at [facility], Jesse Sans, MD   5 months ago Left leg cellulitis   [place]  [facility], Jesse Sans, MD   6 months ago Left leg cellulitis   [place] [facility] at [facility], Jesse Sans, MD       Future Appointments             In 5 months Army Melia Jesse Sans, MD [place] at [place], [place]             omeprazole (PRILOSEC) 40 MG capsule [Pharmacy Med Name: OMEPRAZOLE 40 MG Capsule Delayed Release] 90 capsule 0    Sig: TAKE [place]     Gastroenterology: Proton Pump Inhibitors Passed - 04/29/2022 12:18 PM      Passed - Valid encounter within last 12 months    Recent Outpatient Visits           3 weeks ago Benign essential hypertension   [place] at [facility], Jesse Sans, MD   1 month ago Varicose veins of left leg with edema   [place] [facility] at [facility], Jesse Sans, MD   5 months ago Left leg cellulitis   [place] [facility] at [facility], Jesse Sans, MD   5 months ago Left leg cellulitis   [place] [facility] at [facility], Jesse Sans, MD   6 months ago Left leg cellulitis   [facility] at [facility], Jesse Sans, MD       Future Appointments             In 5 months Army Melia, Jesse Sans, MD [place] at [facility], [facility]

## 2022-05-26 DIAGNOSIS — H6122 Impacted cerumen, left ear: Secondary | ICD-10-CM | POA: Diagnosis not present

## 2022-05-26 DIAGNOSIS — H903 Sensorineural hearing loss, bilateral: Secondary | ICD-10-CM | POA: Diagnosis not present

## 2022-05-26 DIAGNOSIS — J340 Abscess, furuncle and carbuncle of nose: Secondary | ICD-10-CM | POA: Diagnosis not present

## 2022-05-26 DIAGNOSIS — H9313 Tinnitus, bilateral: Secondary | ICD-10-CM | POA: Diagnosis not present

## 2022-06-09 DIAGNOSIS — M5136 Other intervertebral disc degeneration, lumbar region: Secondary | ICD-10-CM | POA: Diagnosis not present

## 2022-06-09 DIAGNOSIS — M170 Bilateral primary osteoarthritis of knee: Secondary | ICD-10-CM | POA: Diagnosis not present

## 2022-07-07 ENCOUNTER — Other Ambulatory Visit: Payer: Self-pay | Admitting: Internal Medicine

## 2022-07-07 DIAGNOSIS — K219 Gastro-esophageal reflux disease without esophagitis: Secondary | ICD-10-CM

## 2022-07-07 DIAGNOSIS — E782 Mixed hyperlipidemia: Secondary | ICD-10-CM

## 2022-07-07 DIAGNOSIS — F411 Generalized anxiety disorder: Secondary | ICD-10-CM

## 2022-07-18 DIAGNOSIS — M810 Age-related osteoporosis without current pathological fracture: Secondary | ICD-10-CM | POA: Diagnosis not present

## 2022-07-23 ENCOUNTER — Inpatient Hospital Stay: Payer: Medicare HMO | Attending: Oncology | Admitting: Oncology

## 2022-07-23 ENCOUNTER — Encounter: Payer: Self-pay | Admitting: Oncology

## 2022-07-23 ENCOUNTER — Inpatient Hospital Stay: Payer: Medicare HMO

## 2022-07-23 VITALS — BP 126/79 | HR 83 | Temp 97.4°F | Ht 63.0 in | Wt 178.1 lb

## 2022-07-23 DIAGNOSIS — Z170 Estrogen receptor positive status [ER+]: Secondary | ICD-10-CM | POA: Insufficient documentation

## 2022-07-23 DIAGNOSIS — Z853 Personal history of malignant neoplasm of breast: Secondary | ICD-10-CM | POA: Diagnosis not present

## 2022-07-23 DIAGNOSIS — C50912 Malignant neoplasm of unspecified site of left female breast: Secondary | ICD-10-CM | POA: Diagnosis not present

## 2022-07-23 DIAGNOSIS — M8580 Other specified disorders of bone density and structure, unspecified site: Secondary | ICD-10-CM | POA: Insufficient documentation

## 2022-07-23 DIAGNOSIS — Z08 Encounter for follow-up examination after completed treatment for malignant neoplasm: Secondary | ICD-10-CM

## 2022-07-23 DIAGNOSIS — R5383 Other fatigue: Secondary | ICD-10-CM

## 2022-07-23 DIAGNOSIS — D649 Anemia, unspecified: Secondary | ICD-10-CM | POA: Diagnosis not present

## 2022-07-23 DIAGNOSIS — Z79811 Long term (current) use of aromatase inhibitors: Secondary | ICD-10-CM | POA: Diagnosis not present

## 2022-07-23 LAB — CBC WITH DIFFERENTIAL/PLATELET
Abs Immature Granulocytes: 0.02 10*3/uL (ref 0.00–0.07)
Basophils Absolute: 0.1 10*3/uL (ref 0.0–0.1)
Basophils Relative: 1 %
Eosinophils Absolute: 0.2 10*3/uL (ref 0.0–0.5)
Eosinophils Relative: 3 %
HCT: 37.6 % (ref 36.0–46.0)
Hemoglobin: 11.6 g/dL — ABNORMAL LOW (ref 12.0–15.0)
Immature Granulocytes: 0 %
Lymphocytes Relative: 27 %
Lymphs Abs: 1.6 10*3/uL (ref 0.7–4.0)
MCH: 28.2 pg (ref 26.0–34.0)
MCHC: 30.9 g/dL (ref 30.0–36.0)
MCV: 91.3 fL (ref 80.0–100.0)
Monocytes Absolute: 0.4 10*3/uL (ref 0.1–1.0)
Monocytes Relative: 7 %
Neutro Abs: 3.7 10*3/uL (ref 1.7–7.7)
Neutrophils Relative %: 62 %
Platelets: 155 10*3/uL (ref 150–400)
RBC: 4.12 MIL/uL (ref 3.87–5.11)
RDW: 15.6 % — ABNORMAL HIGH (ref 11.5–15.5)
WBC: 6 10*3/uL (ref 4.0–10.5)
nRBC: 0 % (ref 0.0–0.2)

## 2022-07-23 LAB — FERRITIN: Ferritin: 43 ng/mL (ref 11–307)

## 2022-07-23 LAB — VITAMIN B12: Vitamin B-12: 3948 pg/mL — ABNORMAL HIGH (ref 180–914)

## 2022-07-23 LAB — FOLATE: Folate: <21 ng/mL (ref >5.9)

## 2022-07-23 LAB — TSH: TSH: 74.703 u[IU]/mL — ABNORMAL HIGH (ref 0.350–4.500)

## 2022-07-23 LAB — IRON AND TIBC
Iron: 68 ug/dL (ref 28–170)
Saturation Ratios: 20 % (ref 10.4–31.8)
TIBC: 347 ug/dL (ref 250–450)
UIBC: 279 ug/dL

## 2022-07-23 NOTE — Progress Notes (Signed)
Hematology/Oncology Consult note [facility]  Telephone:([phone] [phone]  Patient Care Team: Reubin Milan, MD as PCP - General (Internal Medicine) Creig Hines, MD as Consulting Physician (Oncology) Carmina Miller, MD as Referring Physician (Radiation Oncology) Campbell Lerner, MD as Consulting Physician (General Surgery) Jim Like, RN as Registered Nurse Patterson Hammersmith, MD as Consulting Physician (Rheumatology) Tedd Sias Marlana Salvage, MD as Physician Assistant (Endocrinology) Dalia Heading, MD as Consulting Physician (Cardiology) Stanton Kidney, MD as Consulting Physician (Gastroenterology)   Name of the patient: Latoya Stevenson  [id number]  Dec 27, 1940   Date of visit: 07/23/22  Diagnosis- 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  She then noticed possible nipple discharge from her right breast which prompted a diagnostic right breast mammogram which was essentially unremarkable.  MRI was recommended and patient underwent a bilateral MRI which did not show any concerning findings in the right breast where she had the nipple discharge but showed an enhancing mass in the inner left breast measuring 0.9 x 0.6 x 0.7 cm which was suspicious and biopsy. Biopsy was consistent with invasive mammary carcinoma grade 2 6 mm ER greater than 90% positive PR greater than 90% positive and HER-2 negative.   Genetic testing negative   Final pathology showed 7 mm grade 2 invasive mammary carcinoma with positive unifocal anterior margin.  1 sentinel lymph node negative for malignancy.  PT1BPN0 tumor was greater than 90% ER positive PR positive  and HER-2 negative   Patient completed adjuvant radiation therapy and started Arimidex in October 2021.  She has baseline osteoporosis and follows up with endocrinology.  She gets Prolia  Interval history-tolerating Arimidex along with calcium and vitamin D well without any significant side effects.  She is getting Prolia through endocrinology.  She reports ongoing fatigue which has gotten worse over the last few months.  ECOG PS- 1 Pain scale- 0   Review of systems- Review of Systems  Constitutional:  Negative for chills, fever, malaise/fatigue and weight loss.  HENT:  Negative for congestion, ear discharge and nosebleeds.   Eyes:  Negative for blurred vision.  Respiratory:  Negative for cough, hemoptysis, sputum production, shortness of breath and wheezing.   Cardiovascular:  Negative for chest pain, palpitations, orthopnea and claudication.  Gastrointestinal:  Negative for abdominal pain, blood in stool, constipation, diarrhea, heartburn, melena, nausea and vomiting.  Genitourinary:  Negative for dysuria, flank pain, frequency, hematuria and urgency.  Musculoskeletal:  Negative for back pain, joint pain and myalgias.  Skin:  Negative for rash.  Neurological:  Negative for dizziness, tingling, focal weakness, seizures, weakness and headaches.  Endo/Heme/Allergies:  Does not bruise/bleed easily.  Psychiatric/Behavioral:  Negative for depression and suicidal ideas. The patient does not have insomnia.       Allergies  Allergen Reactions   Flagyl [Metronidazole Hcl] Swelling    Swelling in face, mouth, throat, palms of feet and hands.   Furosemide Itching    Made side of mouth irritated and puffy.  The rim inside her lip swelled up   Macrodantin [Nitrofurantoin Macrocrystal] Anaphylaxis, Hives and Swelling   Penicillins Anaphylaxis and Swelling    PATIENT HAD A PCN REACTION WITH IMMEDIATE RASH, FACIAL/TONGUE/THROAT SWELLING,  SOB, OR LIGHTHEADEDNESS WITH HYPOTENSION:  #  #  #  YES  #   #  #   Has patient had a PCN reaction causing severe rash involving mucus membranes or skin necrosis: No Has patient had a PCN reaction that required hospitalization: No Has patient had a PCN reaction occurring within the last 10 years: No If all of the above answers are "NO", then may proceed with Cephalosporin use.    Celebrex [Celecoxib] Swelling    Swelling in joints.  Did not agree with her. Did not help her either   Other     Pt says it was recently discovered she allergic to an ingredient in local anesthesia but she can't recall the name. (epinephrine and the additive used for the biopsy. Gets very nervous. Feels internally shaking. 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 (2018 and 2021)     Past Medical History:  Diagnosis Date   Allergic rhinitis    Anemia    low iron   Anxiety    Arthritis 07/2019   osteoporosis   Asthma without status asthmaticus    seasonal, not often   Bilateral sciatica    Breast cancer (HCC) 07/2019   invasive mammary cancer , grade 2   Bronchitis    Cancer (HCC)     follicular variant papillary thyroid cancer   Chronic back pain    unspecified   Chronic kidney disease    ckd stage 3b   Colon polyp    adenomatous   DDD (degenerative disc disease), lumbar    DJD (degenerative joint disease)    Dysphagia    Dyspnea    E coli bacteremia    around 10/11   Environmental allergies    Family history of breast cancer    Family history of colon cancer    Family history of ovarian cancer    Family history of pancreatic cancer    Family history of prostate cancer    Family history of thyroid cancer    GERD (gastroesophageal reflux disease)    Headache    migraines as a young woman   History of recurrent UTIs    Hyperlipidemia    Hypertension    Hypothyroidism    IBS (irritable bowel syndrome)    Lumbar compression fracture (HCC) 12/02/2016   Lumbar disc disease    Mold exposure 2002   hx of    Osteoporosis,  post-menopausal    Personal history of radiation therapy    Pneumonia 1978   Restless legs    Scoliosis    Spinal stenosis    Thyroid nodule    aspirated in 1997 and 2003 which were negative   Ulcer    Varicose veins of left lower extremity    Vertigo    Wears dentures      Past Surgical History:  Procedure Laterality Date   ABDOMINAL HYSTERECTOMY  1977   Dr. Lance Coon FRACTURE SURGERY Left 2003   arch of foot.  no metal. per patient, she did not have surgery for this fracture. healed on its own   ANTERIOR LAT LUMBAR FUSION Left 09/09/2016   Procedure: Left Lumbar two-three Anterolateral lumbar interbody fusion with lateral plate;  Surgeon: Maeola Harman, MD;  Location: [facility] OR;  Service: Neurosurgery;  Laterality: Left;  Left L2-3 Anterolateral lumbar interbody fusion with lateral plate   APPENDECTOMY     BACK SURGERY  2013, 2016, 2018   lumbar lam, fusion, discectomy.titanium  BIOPSY THYROID     x2  thyroid nodule ; Dr. Okey Dupre and one by Dr. Hyacinth Meeker   BREAST BIOPSY Left 08/02/2019   coil clip-INVASIVE MAMMARY CARCINOMA   BREAST CYST ASPIRATION  1970's   BREAST LUMPECTOMY Left 2021   BREAST LUMPECTOMY WITH RADIOFREQUENCY TAG IDENTIFICATION Left 08/12/2019   Procedure: BREAST LUMPECTOMY WITH RADIOFREQUENCY TAG IDENTIFICATION;  Surgeon: Campbell Lerner, MD;  Location: [facility] ORS;  Service: General;  Laterality: Left;   BREAST LUMPECTOMY WITH SENTINEL LYMPH NODE BIOPSY Left 08/12/2019   Procedure: BREAST LUMPECTOMY WITH SENTINEL LYMPH NODE BX;  Surgeon: Campbell Lerner, MD;  Location: [facility] ORS;  Service: General;  Laterality: Left;   broken arm Left 1994   wrist fracture surgery, left.  metal has been replaced.   CARDIOVASCULAR STRESS TEST     2010   COLONOSCOPY  2010   COLONOSCOPY  07/21/2013   PHX OF CP/REPEAT 87YRS/[place]   ESOPHAGOGASTRODUODENOSCOPY (EGD) WITH PROPOFOL N/A 09/22/2018   Procedure: ESOPHAGOGASTRODUODENOSCOPY (EGD) WITH PROPOFOL;  Surgeon: Toledo, Boykin Nearing, MD;   Location: [facility] ENDOSCOPY;  Service: Gastroenterology;  Laterality: N/A;  Patient to have Rapid COVID-19 day of procedure Boyd Kerbs approved this)   EYE SURGERY Bilateral    cataract surgery with lens implant   FOREIGN BODY REMOVAL N/A 12/09/2019   Procedure: FOREIGN BODY REMOVAL ADULT;  Surgeon: Kennedy Bucker, MD;  Location: [facility] ORS;  Service: Orthopedics;  Laterality: N/A;   INCISION AND DRAINAGE Left 12/09/2019   Procedure: INCISION AND DRAINAGE;  Surgeon: Kennedy Bucker, MD;  Location: [facility] ORS;  Service: Orthopedics;  Laterality: Left;   LACERATION REPAIR Bilateral 12/09/2019   Procedure: REPAIR MULTIPLE LACERATIONS;  Surgeon: Kennedy Bucker, MD;  Location: [facility] ORS;  Service: Orthopedics;  Laterality: Bilateral;   LUMBAR LAMINECTOMY/DECOMPRESSION MICRODISCECTOMY  11/18/2011   Procedure: LUMBAR LAMINECTOMY/DECOMPRESSION MICRODISCECTOMY 2 LEVELS;  Surgeon: Maeola Harman, MD;  Location: [facility];  Service: Neurosurgery;  Laterality: N/A;  Lumbar Three-Four,  Lumbar Four-Five Decompressive Laminectomy   MALONEY DILATION N/A 09/22/2018   Procedure: MALONEY DILATION;  Surgeon: Toledo, Boykin Nearing, MD;  Location: [facility] ENDOSCOPY;  Service: Gastroenterology;  Laterality: N/A;   OVARIAN CYST SURGERY     POSTERIOR FUSION LUMBAR SPINE  2016   TOTAL THYROIDECTOMY     TRANSTHORACIC ECHOCARDIOGRAM  2010   upper and lower GI  2010    Social History   Socioeconomic History   Marital status: Widowed    Spouse name: Not on file   Number of children: 2   Years of education: Not on file   Highest education level: Not on file  Occupational History   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   Social Determinants of Health   Financial Resource Strain: Low Risk  (03/26/2022)   Overall Financial Resource Strain (CARDIA)    Difficulty of Paying Living Expenses: Not hard at all  Food Insecurity: No Food Insecurity (03/26/2022)   Hunger Vital Sign    Worried About Running Out of Food in the Last Year: Never true  Ran Out of Food in the Last Year: Never true  Transportation Needs: No Transportation Needs (03/26/2022)   PRAPARE - Administrator, Civil Service (Medical): No    Lack of Transportation (Non-Medical): No  Physical Activity: Insufficiently Active (01/01/2022)   Exercise Vital Sign    Days of Exercise per Week: 3 days    Minutes of Exercise per Session: 30 min  Stress: No Stress Concern Present (01/01/2022)   [employer] of Occupational Health - Occupational Stress Questionnaire    Feeling of Stress : Not at all  Social Connections: Socially Isolated (01/01/2022)   Social Connection and Isolation Panel [NHANES]    Frequency of Communication with Friends and Family: More than three times a week    Frequency of Social Gatherings with Friends and Family: Three times a week    Attends Religious Services: Never    Active Member of Clubs or Organizations: No    Attends Banker Meetings: Never    Marital Status: Widowed  Intimate Partner Violence: Not At Risk (03/26/2022)   Humiliation, Afraid, Rape, and Kick questionnaire    Fear of Current or Ex-Partner: No    Emotionally Abused: No    Physically Abused: No    Sexually Abused: No    Family History  Problem Relation Age of Onset   Breast cancer Mother 64   Thyroid cancer Mother    Pancreatic cancer Brother    Pancreatic cancer Sister    Suicidality Father    Prostate cancer Brother    Colon cancer Brother    Breast cancer Sister        x 2 times   Heart attack Sister    Ovarian  cancer Maternal Aunt    Breast cancer Paternal Aunt    Lung cancer Paternal Uncle    Breast cancer Other    Colon cancer Maternal Aunt    Pancreatic cancer Maternal Aunt      Current Outpatient Medications:    acetaminophen (TYLENOL) 650 MG CR tablet, Take 1,300 mg by mouth every 8 (eight) hours as needed for pain., Disp: , Rfl:    amLODipine (NORVASC) 2.5 MG tablet, Take 2.5 mg by mouth daily., Disp: , Rfl:    anastrozole (ARIMIDEX) 1 MG tablet, TAKE 1 TABLET EVERY DAY, Disp: 90 tablet, Rfl: 3   Ascorbic Acid (VITAMIN C) 1000 MG tablet, Take 1,000 mg by mouth daily., Disp: , Rfl:    atorvastatin (LIPITOR) 40 MG tablet, TAKE 1 TABLET AT BEDTIME, Disp: 90 tablet, Rfl: 3   Calcium Carbonate-Vitamin D 600-400 MG-UNIT tablet, Take 2 tablets by mouth daily., Disp: , Rfl:    chlorthalidone (HYGROTON) 25 MG tablet, Take 25 mg by mouth in the morning and at bedtime. , Disp: , Rfl: 1   cholecalciferol (VITAMIN D3) 25 MCG (1000 UNIT) tablet, Take 1,000 Units by mouth daily., Disp: , Rfl:    denosumab (PROLIA) 60 MG/ML SOSY injection, Inject 60 mg into the skin every 6 (six) months., Disp: , Rfl:    diclofenac Sodium (VOLTAREN) 1 % GEL, Apply 2 g topically 4 (four) times daily., Disp: , Rfl:    fexofenadine (ALLEGRA) 180 MG tablet, Take 180 mg by mouth daily., Disp: , Rfl:    fluticasone (FLONASE) 50 MCG/ACT nasal spray, USE 2 SPRAYS INTO BOTH NOSTRILS DAILY AS NEEDED FOR ALLERGIES., Disp: 16 g, Rfl: 3   gabapentin (NEURONTIN) 300 MG capsule, Take 300 mg by mouth at bedtime., Disp: , Rfl:    GLUCOSAMINE-CHONDROITIN  PO, Take 1 tablet by mouth daily. , Disp: , Rfl:    irbesartan (AVAPRO) 300 MG tablet, TAKE 1 TABLET EVERY DAY, Disp: 90 tablet, Rfl: 1   levothyroxine (SYNTHROID) 150 MCG tablet, TAKE 1 TABLET EVERY DAY BEFORE BREAKFAST, Disp: 90 tablet, Rfl: 3   Multiple Vitamin (MULTIVITAMIN WITH MINERALS) TABS, Take 1 tablet by mouth daily., Disp: , Rfl:    omeprazole (PRILOSEC) 40 MG capsule, TAKE 1  CAPSULE EVERY DAY BEFORE BREAKFAST, Disp: 90 capsule, Rfl: 3   sertraline (ZOLOFT) 50 MG tablet, TAKE 1 TABLET EVERY DAY, Disp: 90 tablet, Rfl: 3   vitamin E 180 MG (400 UNITS) capsule, Take 400 Units by mouth daily., Disp: , Rfl:   Physical exam:  Vitals:   07/23/22 1347  BP: 126/79  Pulse: 83  Temp: (!) 97.4 F (36.3 C)  TempSrc: Tympanic  SpO2: 100%  Weight: 178 lb 1.6 oz (80.8 kg)  Height: 5\' 3"  (1.6 m)   Physical Exam Cardiovascular:     Rate and Rhythm: Normal rate and regular rhythm.     Heart sounds: Normal heart sounds.  Pulmonary:     Effort: Pulmonary effort is normal.     Breath sounds: Normal breath sounds.  Abdominal:     General: Bowel sounds are normal.     Palpations: Abdomen is soft.  Skin:    General: Skin is warm and dry.  Neurological:     Mental Status: She is alert and oriented to person, place, and time.    Breast exam was performed in seated and lying down position. Patient is status post left lumpectomy with a well-healed surgical scar. No evidence of any palpable masses. No evidence of axillary adenopathy. No evidence of any palpable masses or lumps in the right breast. No evidence of right axillary adenopathy      Latest Ref Rng & Units 04/09/2022   10:26 AM  CMP  Glucose 70 - 99 mg/dL 84   BUN 8 - 27 mg/dL 26   Creatinine 1.61 - 1.00 mg/dL 0.96   Sodium 045 - 409 mmol/L 142   Potassium 3.5 - 5.2 mmol/L 3.7   Chloride 96 - 106 mmol/L 105   CO2 20 - 29 mmol/L 21   Calcium 8.7 - 10.3 mg/dL 9.6       Latest Ref Rng & Units 10/24/2021    6:47 PM  CBC  WBC 4.0 - 10.5 K/uL 8.2   Hemoglobin 12.0 - 15.0 g/dL 81.1   Hematocrit 91.4 - 46.0 % 38.6   Platelets 150 - 400 K/uL 193      Assessment and plan- Patient is a 82 y.o. female with history of stage I left breast cancer ER/PR positive HER2 negative. She is currently on arimidex and here for routine f/u  Patient is tolerating Arimidex along with calcium and vitamin D well without any  significant side effects.  She will continue taking this until October 2026.  She has baseline osteopenia/osteoporosis for which she is on Prolia through endocrinology.  I will see her back in 6 months no labs.    Today patient reports ongoing fatigue and I will be checking CBC ferritin and iron studies B12 and TSH.  We will give her a call if the results of the blood work are abnormal   Visit Diagnosis 1. Encounter for follow-up surveillance of breast cancer   2. Other fatigue   3. Visit for monitoring Arimidex therapy      Dr. Owens Shark, MD, MPH  [facility] at [facility] [phone] 07/23/2022 1:58 PM

## 2022-07-24 ENCOUNTER — Telehealth: Payer: Self-pay | Admitting: *Deleted

## 2022-07-24 NOTE — Telephone Encounter (Signed)
Dr. Smith Robert wanted me to send message to Dr. Laban Emperor and Dr. Tedd Sias about pt. Fatigue and the tsh level is 74.703.Asked if one of the MD"s could send a message to [place] and help pt with this issue. I sent it through routing the numbers to the 2 MD's above and it sends it through fax.

## 2022-07-24 NOTE — Telephone Encounter (Signed)
Called the pt and let her know that I have sent a message to the dr Judithann Graves and dr Tedd Sias about her ongoing fatigue and the tsh 74.7. pt. Says that she gets her thyroid medicine from [facility] and I told the pt that I did send message to her and dr sloum about tsh level. I asked the pt. To call PCP if they do not get in touch in te next few days for pt to call her self. She will.

## 2022-07-25 ENCOUNTER — Telehealth: Payer: Self-pay | Admitting: Internal Medicine

## 2022-07-25 NOTE — Telephone Encounter (Signed)
Already scheduled

## 2022-07-25 NOTE — Telephone Encounter (Signed)
Copied from CRM [id number]. Topic: General - Other >> Jul 25, 2022  1:22 PM Dondra Prader A wrote: Reason for CRM: Pt states that she went to there Oncologists on Wednesday and had blood work done and was told that the results would be sent to her PCP. Pt is wanting to see what her PCP is going to advise her to do and what her next steps should be. Please call pt back to discuss.

## 2022-07-30 ENCOUNTER — Ambulatory Visit (INDEPENDENT_AMBULATORY_CARE_PROVIDER_SITE_OTHER): Payer: Medicare HMO | Admitting: Internal Medicine

## 2022-07-30 ENCOUNTER — Encounter: Payer: Self-pay | Admitting: Internal Medicine

## 2022-07-30 VITALS — BP 135/84 | HR 60 | Ht 63.0 in | Wt 181.0 lb

## 2022-07-30 DIAGNOSIS — L98491 Non-pressure chronic ulcer of skin of other sites limited to breakdown of skin: Secondary | ICD-10-CM | POA: Diagnosis not present

## 2022-07-30 DIAGNOSIS — E039 Hypothyroidism, unspecified: Secondary | ICD-10-CM

## 2022-07-30 NOTE — Assessment & Plan Note (Addendum)
Recent TSH was very elevated. She has been taking her supplement daily. Lab Results  Component Value Date   TSH 74.703 (H) 07/23/2022  Complaining of weight gain and fatigue.

## 2022-07-30 NOTE — Progress Notes (Signed)
Date:  07/30/2022   Name:  Latoya Stevenson   DOB:  1941-03-14   MRN:  [id number]   Chief Complaint: Hypothyroidism  Thyroid Problem Presents for follow-up visit. Symptoms include fatigue and weight loss. Patient reports no anxiety, constipation, diaphoresis, diarrhea or palpitations.   Toe ulcer - on the inside of the right second toe started about one week ago.  She is using a donut pad but having a significant amount of pain.She has seen Dr. Clide Cliff in the past for foot pain.  Lab Results  Component Value Date   NA 142 04/09/2022   K 3.7 04/09/2022   CO2 21 04/09/2022   GLUCOSE 84 04/09/2022   BUN 26 04/09/2022   CREATININE 1.33 (H) 04/09/2022   CALCIUM 9.6 04/09/2022   EGFR 40 (L) 04/09/2022   GFRNONAA 39 (L) 10/24/2021   Lab Results  Component Value Date   CHOL 154 10/07/2021   HDL 55 10/07/2021   LDLCALC 85 10/07/2021   TRIG 70 10/07/2021   CHOLHDL 2.8 10/07/2021   Lab Results  Component Value Date   TSH 74.703 (H) 07/23/2022   No results found for: "HGBA1C" Lab Results  Component Value Date   WBC 6.0 07/23/2022   HGB 11.6 (L) 07/23/2022   HCT 37.6 07/23/2022   MCV 91.3 07/23/2022   PLT 155 07/23/2022   Lab Results  Component Value Date   ALT 16 10/07/2021   AST 17 10/07/2021   ALKPHOS 76 10/07/2021   BILITOT 0.7 10/07/2021   Lab Results  Component Value Date   VD25OH 48.9 04/09/2022     Review of Systems  Constitutional:  Positive for fatigue, unexpected weight change and weight loss. Negative for diaphoresis.  HENT:  Negative for nosebleeds.   Eyes:  Negative for visual disturbance.  Respiratory:  Negative for cough, chest tightness, shortness of breath and wheezing.   Cardiovascular:  Positive for leg swelling. Negative for chest pain and palpitations.  Gastrointestinal:  Negative for abdominal pain, constipation and diarrhea.  Musculoskeletal:  Positive for arthralgias.  Skin:  Positive for wound.  Neurological:  Negative for dizziness,  weakness, light-headedness and headaches.  Psychiatric/Behavioral:  Negative for dysphoric mood and sleep disturbance. The patient is not nervous/anxious.     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) 08/04/2019   Esophageal stenosis 01/28/2018   Varicose veins of left leg with edema 07/29/2017   Dysphagia, oropharyngeal 04/30/2017   Lumbar degenerative disc disease 04/30/2017   Spinal stenosis of lumbar region 12/08/2016   Idiopathic scoliosis of lumbar region 09/09/2016   Chronic midline low back pain with right-sided sciatica 07/28/2016   GERD without esophagitis 07/27/2015   Intermittent vertigo 07/27/2015   Osteoporosis, senile 07/27/2015   Seasonal allergic rhinitis due to pollen 07/27/2015   Acquired hypothyroidism 01/05/2015   Benign essential hypertension 06/29/2014   Mixed hyperlipidemia 06/29/2014   Stage 3b chronic kidney disease (HCC) 06/29/2014   Insomnia 11/11/2013   Mild reactive airways disease 11/11/2013    Allergies  Allergen Reactions   Flagyl [Metronidazole Hcl] Swelling    Swelling in face, mouth, throat, palms of feet and hands.   Furosemide Itching    Made side of mouth irritated and  puffy.  The rim inside her lip swelled up   Macrodantin [Nitrofurantoin Macrocrystal] Anaphylaxis, Hives and Swelling   Penicillins Anaphylaxis and Swelling    PATIENT HAD A PCN REACTION WITH IMMEDIATE RASH, FACIAL/TONGUE/THROAT SWELLING, SOB, OR LIGHTHEADEDNESS WITH HYPOTENSION:  #  #  #  YES  #  #  #   Has patient had a PCN reaction causing severe rash involving mucus membranes or skin necrosis: No Has patient had a PCN reaction that required hospitalization: No Has patient  had a PCN reaction occurring within the last 10 years: No If all of the above answers are "NO", then may proceed with Cephalosporin use.    Celebrex [Celecoxib] Swelling    Swelling in joints.  Did not agree with her. Did not help her either   Other     Pt says it was recently discovered she allergic to an ingredient in local anesthesia but she can't recall the name. (epinephrine and the additive used for the biopsy. Gets very nervous. Feels internally shaking. 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 Lance Coon FRACTURE SURGERY Left 2003   arch of foot.  no metal. per patient, she did not have surgery for this fracture. healed on its own   ANTERIOR LAT LUMBAR FUSION Left 09/09/2016   Procedure: Left Lumbar two-three Anterolateral lumbar interbody fusion with lateral plate;  Surgeon: Maeola Harman, MD;  Location: [facility] OR;  Service: Neurosurgery;  Laterality: Left;  Left L2-3 Anterolateral lumbar interbody fusion with lateral plate   APPENDECTOMY     BACK SURGERY  2013, 2016, 2018   lumbar lam, fusion, discectomy.titanium   BIOPSY THYROID     x2  thyroid nodule ; Dr. Okey Dupre and one by Dr. Hyacinth Meeker   BREAST BIOPSY Left 08/02/2019   coil clip-INVASIVE MAMMARY CARCINOMA   BREAST CYST ASPIRATION  1970's   BREAST LUMPECTOMY Left 2021   BREAST LUMPECTOMY WITH RADIOFREQUENCY TAG IDENTIFICATION Left 08/12/2019   Procedure: BREAST LUMPECTOMY WITH RADIOFREQUENCY TAG IDENTIFICATION;  Surgeon: Campbell Lerner, MD;  Location: [facility] ORS;  Service: General;  Laterality: Left;   BREAST LUMPECTOMY WITH SENTINEL LYMPH NODE BIOPSY Left 08/12/2019   Procedure: BREAST LUMPECTOMY WITH SENTINEL LYMPH NODE BX;  Surgeon: Campbell Lerner, MD;  Location: [facility] ORS;  Service: General;  Laterality: Left;   broken arm Left 1994   wrist fracture surgery, left.  metal has been replaced.    CARDIOVASCULAR STRESS TEST     2010   COLONOSCOPY  2010   COLONOSCOPY  07/21/2013   PHX OF CP/REPEAT 58YRS/[place]   ESOPHAGOGASTRODUODENOSCOPY (EGD) WITH PROPOFOL N/A 09/22/2018   Procedure: ESOPHAGOGASTRODUODENOSCOPY (EGD) WITH PROPOFOL;  Surgeon: Toledo, Boykin Nearing, MD;  Location: [facility];  Service: Gastroenterology;  Laterality: N/A;  Patient to have Rapid COVID-19 day of procedure Boyd Kerbs approved this)   EYE SURGERY Bilateral    cataract surgery with lens implant   FOREIGN BODY REMOVAL N/A 12/09/2019   Procedure: FOREIGN BODY REMOVAL ADULT;  Surgeon: Kennedy Bucker, MD;  Location: [facility] ORS;  Service: Orthopedics;  Laterality: N/A;   INCISION AND DRAINAGE Left 12/09/2019   Procedure: INCISION AND DRAINAGE;  Surgeon: Kennedy Bucker, MD;  Location: [facility] ORS;  Service: Orthopedics;  Laterality: Left;   LACERATION REPAIR Bilateral 12/09/2019   Procedure: REPAIR MULTIPLE LACERATIONS;  Surgeon: Kennedy Bucker, MD;  Location: [facility] ORS;  Service: Orthopedics;  Laterality: Bilateral;   LUMBAR LAMINECTOMY/DECOMPRESSION MICRODISCECTOMY  11/18/2011   Procedure: LUMBAR LAMINECTOMY/DECOMPRESSION MICRODISCECTOMY 2 LEVELS;  Surgeon: Maeola Harman, MD;  Location: [facility];  Service: Neurosurgery;  Laterality: N/A;  Lumbar Three-Four,  Lumbar Four-Five Decompressive Laminectomy   MALONEY DILATION N/A 09/22/2018   Procedure: MALONEY DILATION;  Surgeon: Toledo, Boykin Nearing, MD;  Location: [facility];  Service: Gastroenterology;  Laterality: N/A;   OVARIAN CYST SURGERY     POSTERIOR FUSION LUMBAR SPINE  2016   TOTAL THYROIDECTOMY     TRANSTHORACIC ECHOCARDIOGRAM  2010   upper and lower GI  2010    Social History   Tobacco Use   Smoking status: Never   Smokeless tobacco: Never  Vaping Use   Vaping Use: Never used  Substance Use Topics   Alcohol use: Not Currently    Comment: socially-rare   Drug use: No     Medication list has been reviewed and updated.  Current Meds  Medication Sig    acetaminophen (TYLENOL) 650 MG CR tablet Take 1,300 mg by mouth every 8 (eight) hours as needed for pain.   amLODipine (NORVASC) 2.5 MG tablet Take 2.5 mg by mouth daily.   anastrozole (ARIMIDEX) 1 MG tablet TAKE 1 TABLET EVERY DAY   Ascorbic Acid (VITAMIN C) 1000 MG tablet Take 1,000 mg by mouth daily.   atorvastatin (LIPITOR) 40 MG tablet TAKE 1 TABLET AT BEDTIME   Calcium Carbonate-Vitamin D 600-400 MG-UNIT tablet Take 2 tablets by mouth daily.   chlorthalidone (HYGROTON) 25 MG tablet Take 25 mg by mouth in the morning and at bedtime.    cholecalciferol (VITAMIN D3) 25 MCG (1000 UNIT) tablet Take 1,000 Units by mouth daily.   denosumab (PROLIA) 60 MG/ML SOSY injection Inject 60 mg into the skin every 6 (six) months.   diclofenac Sodium (VOLTAREN) 1 % GEL Apply 2 g topically 4 (four) times daily.   fexofenadine (ALLEGRA) 180 MG tablet Take 180 mg by mouth daily.   fluticasone (FLONASE) 50 MCG/ACT nasal spray USE 2 SPRAYS INTO BOTH NOSTRILS DAILY AS NEEDED FOR ALLERGIES.   gabapentin (NEURONTIN) 300 MG capsule Take 600 mg by mouth at bedtime.   GLUCOSAMINE-CHONDROITIN PO Take 1 tablet by mouth daily.    irbesartan (AVAPRO) 300 MG tablet TAKE 1 TABLET EVERY DAY   levothyroxine (SYNTHROID) 150 MCG tablet TAKE 1 TABLET EVERY DAY BEFORE BREAKFAST   Multiple Vitamin (MULTIVITAMIN WITH MINERALS) TABS Take 1 tablet by mouth daily.   omeprazole (PRILOSEC) 40 MG capsule TAKE 1 CAPSULE EVERY DAY BEFORE BREAKFAST   sertraline (ZOLOFT) 50 MG tablet TAKE 1 TABLET EVERY DAY   vitamin E 180 MG (400 UNITS) capsule Take 400 Units by mouth daily.       07/30/2022    3:05 PM 04/09/2022   10:04 AM 03/26/2022    2:38 PM 01/01/2022    3:38 PM  GAD 7 : Generalized Anxiety Score  Nervous, Anxious, on Edge 1 0 0 0  Control/stop worrying 1 0 0 0  Worry too much - different things 1 0 0 0  Trouble relaxing 0 0 0 0  Restless 0 0 0 0  Easily annoyed or irritable 1 0 0 0  Afraid - awful might happen 0 0 0 0   Total GAD 7 Score 4 0 0 0  Anxiety Difficulty Not difficult at all Not difficult at all Not difficult at all Not difficult at all       07/30/2022    3:05 PM 04/09/2022  10:04 AM 03/26/2022    2:37 PM  Depression screen PHQ 2/9  Decreased Interest 3 0 0  Down, Depressed, Hopeless 0 0 0  PHQ - 2 Score 3 0 0  Altered sleeping 0 0 2  Tired, decreased energy 3 0 3  Change in appetite 0 0 0  Feeling bad or failure about yourself  0 0 0  Trouble concentrating 0 0 0  Moving slowly or fidgety/restless 0 0 0  Suicidal thoughts 0 0 0  PHQ-9 Score 6 0 5  Difficult doing work/chores Not difficult at all Not difficult at all Not difficult at all    BP Readings from Last 3 Encounters:  07/30/22 135/84  07/23/22 126/79  04/09/22 126/74    Physical Exam Vitals and nursing note reviewed.  Constitutional:      General: She is not in acute distress.    Appearance: Normal appearance. She is well-developed.  HENT:     Head: Normocephalic and atraumatic.  Cardiovascular:     Rate and Rhythm: Normal rate and regular rhythm.  Pulmonary:     Effort: Pulmonary effort is normal. No respiratory distress.     Breath sounds: No wheezing or rhonchi.  Musculoskeletal:     Cervical back: Normal range of motion.       Feet:  Feet:     Comments: Callous with central ulcer Lymphadenopathy:     Cervical: No cervical adenopathy.  Skin:    General: Skin is warm and dry.     Findings: No rash.  Neurological:     Mental Status: She is alert and oriented to person, place, and time.  Psychiatric:        Mood and Affect: Mood normal.        Behavior: Behavior normal.     Wt Readings from Last 3 Encounters:  07/30/22 181 lb (82.1 kg)  07/23/22 178 lb 1.6 oz (80.8 kg)  04/09/22 175 lb 6.4 oz (79.6 kg)    BP 135/84 (BP Location: Left Arm, Cuff Size: Large)   Pulse 60   Ht 5\' 3"  (1.6 m)   Wt 181 lb (82.1 kg)   BMI 32.06 kg/m   Assessment and Plan:  Problem List Items Addressed This Visit      Acquired hypothyroidism - Primary (Chronic)    Recent TSH was very elevated. She has been taking her supplement daily. Lab Results  Component Value Date   TSH 74.703 (H) 07/23/2022  Complaining of weight gain and fatigue.       Relevant Orders   TSH+T4F+T3Free   Other Visit Diagnoses     Callous ulcer, limited to breakdown of skin (HCC)       Does not appear infected Needs Podiatry care - pt will contact her podiatrist       No follow-ups on file.   Partially dictated using Dragon software, any errors are not intentional.  Reubin Milan, MD [facility] [place], [place]

## 2022-07-31 ENCOUNTER — Other Ambulatory Visit: Payer: Self-pay | Admitting: Internal Medicine

## 2022-07-31 DIAGNOSIS — E039 Hypothyroidism, unspecified: Secondary | ICD-10-CM

## 2022-07-31 DIAGNOSIS — E890 Postprocedural hypothyroidism: Secondary | ICD-10-CM | POA: Diagnosis not present

## 2022-07-31 DIAGNOSIS — N1832 Chronic kidney disease, stage 3b: Secondary | ICD-10-CM | POA: Diagnosis not present

## 2022-07-31 DIAGNOSIS — M810 Age-related osteoporosis without current pathological fracture: Secondary | ICD-10-CM | POA: Diagnosis not present

## 2022-07-31 DIAGNOSIS — Z8781 Personal history of (healed) traumatic fracture: Secondary | ICD-10-CM | POA: Diagnosis not present

## 2022-07-31 LAB — TSH+T4F+T3FREE
Free T4: 0.39 ng/dL — ABNORMAL LOW (ref 0.82–1.77)
T3, Free: 1.1 pg/mL — ABNORMAL LOW (ref 2.0–4.4)
TSH: 75.5 u[IU]/mL — ABNORMAL HIGH (ref 0.450–4.500)

## 2022-07-31 MED ORDER — LEVOTHYROXINE SODIUM 175 MCG PO TABS
175.0000 ug | ORAL_TABLET | Freq: Every day | ORAL | 0 refills | Status: DC
Start: 2022-07-31 — End: 2022-10-09

## 2022-08-01 DIAGNOSIS — M2012 Hallux valgus (acquired), left foot: Secondary | ICD-10-CM | POA: Diagnosis not present

## 2022-08-01 DIAGNOSIS — M2042 Other hammer toe(s) (acquired), left foot: Secondary | ICD-10-CM | POA: Diagnosis not present

## 2022-08-01 DIAGNOSIS — M2011 Hallux valgus (acquired), right foot: Secondary | ICD-10-CM | POA: Diagnosis not present

## 2022-08-01 DIAGNOSIS — M2041 Other hammer toe(s) (acquired), right foot: Secondary | ICD-10-CM | POA: Diagnosis not present

## 2022-08-01 DIAGNOSIS — L97511 Non-pressure chronic ulcer of other part of right foot limited to breakdown of skin: Secondary | ICD-10-CM | POA: Diagnosis not present

## 2022-08-01 NOTE — Progress Notes (Signed)
Pt called. Pt aware. Pt stated Dr. Max Fickle took over her thyroid medications ad sent in 175 mcg.  KP

## 2022-08-08 DIAGNOSIS — M5416 Radiculopathy, lumbar region: Secondary | ICD-10-CM | POA: Diagnosis not present

## 2022-08-12 ENCOUNTER — Other Ambulatory Visit: Payer: Self-pay | Admitting: Internal Medicine

## 2022-08-19 DIAGNOSIS — M5416 Radiculopathy, lumbar region: Secondary | ICD-10-CM | POA: Diagnosis not present

## 2022-08-22 DIAGNOSIS — L97511 Non-pressure chronic ulcer of other part of right foot limited to breakdown of skin: Secondary | ICD-10-CM | POA: Diagnosis not present

## 2022-09-09 DIAGNOSIS — E890 Postprocedural hypothyroidism: Secondary | ICD-10-CM | POA: Diagnosis not present

## 2022-09-12 DIAGNOSIS — L97511 Non-pressure chronic ulcer of other part of right foot limited to breakdown of skin: Secondary | ICD-10-CM | POA: Diagnosis not present

## 2022-09-12 DIAGNOSIS — M2042 Other hammer toe(s) (acquired), left foot: Secondary | ICD-10-CM | POA: Diagnosis not present

## 2022-09-12 DIAGNOSIS — M2041 Other hammer toe(s) (acquired), right foot: Secondary | ICD-10-CM | POA: Diagnosis not present

## 2022-09-12 DIAGNOSIS — M2012 Hallux valgus (acquired), left foot: Secondary | ICD-10-CM | POA: Diagnosis not present

## 2022-09-12 DIAGNOSIS — M2011 Hallux valgus (acquired), right foot: Secondary | ICD-10-CM | POA: Diagnosis not present

## 2022-09-15 DIAGNOSIS — N189 Chronic kidney disease, unspecified: Secondary | ICD-10-CM | POA: Diagnosis not present

## 2022-09-15 DIAGNOSIS — R600 Localized edema: Secondary | ICD-10-CM | POA: Diagnosis not present

## 2022-09-15 DIAGNOSIS — I10 Essential (primary) hypertension: Secondary | ICD-10-CM | POA: Diagnosis not present

## 2022-09-15 DIAGNOSIS — I499 Cardiac arrhythmia, unspecified: Secondary | ICD-10-CM | POA: Diagnosis not present

## 2022-09-15 DIAGNOSIS — E782 Mixed hyperlipidemia: Secondary | ICD-10-CM | POA: Diagnosis not present

## 2022-09-16 DIAGNOSIS — E890 Postprocedural hypothyroidism: Secondary | ICD-10-CM | POA: Diagnosis not present

## 2022-09-16 DIAGNOSIS — Z8781 Personal history of (healed) traumatic fracture: Secondary | ICD-10-CM | POA: Diagnosis not present

## 2022-09-16 DIAGNOSIS — M810 Age-related osteoporosis without current pathological fracture: Secondary | ICD-10-CM | POA: Diagnosis not present

## 2022-09-16 DIAGNOSIS — C73 Malignant neoplasm of thyroid gland: Secondary | ICD-10-CM | POA: Diagnosis not present

## 2022-09-16 DIAGNOSIS — N1832 Chronic kidney disease, stage 3b: Secondary | ICD-10-CM | POA: Diagnosis not present

## 2022-09-22 DIAGNOSIS — G5602 Carpal tunnel syndrome, left upper limb: Secondary | ICD-10-CM | POA: Diagnosis not present

## 2022-09-22 DIAGNOSIS — M5136 Other intervertebral disc degeneration, lumbar region: Secondary | ICD-10-CM | POA: Diagnosis not present

## 2022-09-22 DIAGNOSIS — M159 Polyosteoarthritis, unspecified: Secondary | ICD-10-CM | POA: Diagnosis not present

## 2022-10-01 DIAGNOSIS — G8929 Other chronic pain: Secondary | ICD-10-CM | POA: Diagnosis not present

## 2022-10-01 DIAGNOSIS — M47816 Spondylosis without myelopathy or radiculopathy, lumbar region: Secondary | ICD-10-CM | POA: Diagnosis not present

## 2022-10-01 DIAGNOSIS — R600 Localized edema: Secondary | ICD-10-CM | POA: Diagnosis not present

## 2022-10-01 DIAGNOSIS — N189 Chronic kidney disease, unspecified: Secondary | ICD-10-CM | POA: Diagnosis not present

## 2022-10-01 DIAGNOSIS — M47818 Spondylosis without myelopathy or radiculopathy, sacral and sacrococcygeal region: Secondary | ICD-10-CM | POA: Diagnosis not present

## 2022-10-01 DIAGNOSIS — M1711 Unilateral primary osteoarthritis, right knee: Secondary | ICD-10-CM | POA: Diagnosis not present

## 2022-10-02 DIAGNOSIS — R600 Localized edema: Secondary | ICD-10-CM | POA: Diagnosis not present

## 2022-10-09 ENCOUNTER — Ambulatory Visit (INDEPENDENT_AMBULATORY_CARE_PROVIDER_SITE_OTHER): Payer: Medicare HMO | Admitting: Internal Medicine

## 2022-10-09 ENCOUNTER — Encounter: Payer: Self-pay | Admitting: Internal Medicine

## 2022-10-09 VITALS — BP 110/72 | HR 85 | Ht 63.0 in | Wt 174.0 lb

## 2022-10-09 DIAGNOSIS — K219 Gastro-esophageal reflux disease without esophagitis: Secondary | ICD-10-CM

## 2022-10-09 DIAGNOSIS — E039 Hypothyroidism, unspecified: Secondary | ICD-10-CM

## 2022-10-09 DIAGNOSIS — I10 Essential (primary) hypertension: Secondary | ICD-10-CM

## 2022-10-09 DIAGNOSIS — Z853 Personal history of malignant neoplasm of breast: Secondary | ICD-10-CM

## 2022-10-09 DIAGNOSIS — N1832 Chronic kidney disease, stage 3b: Secondary | ICD-10-CM

## 2022-10-09 DIAGNOSIS — R300 Dysuria: Secondary | ICD-10-CM | POA: Diagnosis not present

## 2022-10-09 DIAGNOSIS — E782 Mixed hyperlipidemia: Secondary | ICD-10-CM | POA: Diagnosis not present

## 2022-10-09 DIAGNOSIS — Z0000 Encounter for general adult medical examination without abnormal findings: Secondary | ICD-10-CM | POA: Diagnosis not present

## 2022-10-09 LAB — POCT URINALYSIS DIPSTICK
Bilirubin, UA: NEGATIVE
Blood, UA: NEGATIVE
Glucose, UA: NEGATIVE
Ketones, UA: NEGATIVE
Nitrite, UA: NEGATIVE
Protein, UA: NEGATIVE
Spec Grav, UA: 1.015 (ref 1.010–1.025)
Urobilinogen, UA: 0.2 E.U./dL
pH, UA: 5 (ref 5.0–8.0)

## 2022-10-09 NOTE — Assessment & Plan Note (Addendum)
Supplemented.  Last TSH high and dose increased to 175 mcg. Repeat TSH now low and T4 slightly high so dose decreased back to 150 mcg by Endo

## 2022-10-09 NOTE — Assessment & Plan Note (Signed)
Reflux symptoms are minimal on current therapy - omeprazole. No red flag signs such as weight loss, n/v, melena  

## 2022-10-09 NOTE — Assessment & Plan Note (Addendum)
Normal exam with stable BP on avapro, amlodipine and chlorthalidone.  Amlodipine on hold by Cardiology for current workup No concerns or side effects to current medication. No change in regimen; continue low sodium diet. INTERPRETATION  NORMAL LEFT VENTRICULAR SYSTOLIC FUNCTION  NORMAL RIGHT VENTRICULAR SYSTOLIC FUNCTION  MILD VALVULAR REGURGITATION (See above)  NO VALVULAR STENOSIS  IRREGULAR HEART RHYTHM CAPTURED THROUGHOUT EXAM - heart monitor planned LVEF WITHIN LOWER LIMITS OF NORMAL 50-55%  CALCULATED: 51.9%  NO SIGNIFICANT CHANGE FROM PREVIOUS ECHO

## 2022-10-09 NOTE — Assessment & Plan Note (Signed)
LDL is  Lab Results  Component Value Date   LDLCALC 85 10/07/2021   Currently being treated with atorvastatin with good compliance and no concerns.

## 2022-10-09 NOTE — Assessment & Plan Note (Addendum)
No change in exam; continues on Arimidex Due for mammogram this fall

## 2022-10-09 NOTE — Assessment & Plan Note (Addendum)
Most recent GFR down to 25. Cardiology has held the amlodipine and doubled the chlorthalidone Plans for labs next week Will recheck labs today and advise patient - may need to see Nephrology

## 2022-10-09 NOTE — Progress Notes (Signed)
Date:  10/09/2022   Name:  Latoya Stevenson   DOB:  Apr 21, 1940   MRN:  [id number]   Chief Complaint: Annual Exam Latoya Stevenson is a 82 y.o. female who presents today for her Complete Annual Exam. She feels poorly. She reports exercising - none. She reports she is sleeping poorly. Breast complaints - none.  Mammogram: 01/2022 -  DEXA: 11/2019 Pap smear: discontinued Colonoscopy: 02/2021  Health Maintenance Due  Topic Date Due   Zoster Vaccines- Shingrix (1 of 2) Never done   COVID-19 Vaccine (4 - 2023-24 season) 11/15/2021    Immunization History  Administered Date(s) Administered   Fluad Quad(high Dose 65+) 01/01/2022   Influenza Split 02/15/2014, 01/05/2015   Influenza,inj,Quad PF,6+ Mos 03/08/2019   Influenza-Unspecified 01/28/2017, 02/12/2021   PFIZER(Purple Top)SARS-COV-2 Vaccination 05/31/2019, 06/21/2019, 04/03/2020   PNEUMOCOCCAL CONJUGATE-20 10/07/2021   Pneumococcal Conjugate-13 07/31/2016   Tdap 08/09/2014    Hypertension This is a chronic problem. The problem is controlled. Pertinent negatives include no chest pain, headaches, palpitations or shortness of breath. Past treatments include angiotensin blockers, calcium channel blockers and diuretics. Identifiable causes of hypertension include a thyroid problem.  Hyperlipidemia This is a chronic problem. The problem is controlled. Pertinent negatives include no chest pain or shortness of breath. Current antihyperlipidemic treatment includes statins. The current treatment provides significant improvement of lipids.  Thyroid Problem Presents for follow-up visit. Patient reports no anxiety, constipation, diarrhea, fatigue, palpitations or tremors. The symptoms have been stable. Her past medical history is significant for hyperlipidemia.  Gastroesophageal Reflux She complains of heartburn. She reports no abdominal pain, no chest pain, no coughing or no wheezing. This is a recurrent problem. The problem occurs rarely.  Pertinent negatives include no fatigue. She has tried a PPI for the symptoms.  Depression        This is a chronic problem.The problem is unchanged.  Associated symptoms include no fatigue and no headaches.  Past treatments include SSRIs - Selective serotonin reuptake inhibitors.  Past medical history includes thyroid problem.     Lab Results  Component Value Date   NA 142 04/09/2022   K 3.7 04/09/2022   CO2 21 04/09/2022   GLUCOSE 84 04/09/2022   BUN 26 04/09/2022   CREATININE 1.33 (H) 04/09/2022   CALCIUM 9.6 04/09/2022   EGFR 40 (L) 04/09/2022   GFRNONAA 39 (L) 10/24/2021   Lab Results  Component Value Date   CHOL 154 10/07/2021   HDL 55 10/07/2021   LDLCALC 85 10/07/2021   TRIG 70 10/07/2021   CHOLHDL 2.8 10/07/2021   Lab Results  Component Value Date   TSH 75.500 (H) 07/30/2022   No results found for: "HGBA1C" Lab Results  Component Value Date   WBC 6.0 07/23/2022   HGB 11.6 (L) 07/23/2022   HCT 37.6 07/23/2022   MCV 91.3 07/23/2022   PLT 155 07/23/2022   Lab Results  Component Value Date   ALT 16 10/07/2021   AST 17 10/07/2021   ALKPHOS 76 10/07/2021   BILITOT 0.7 10/07/2021   Lab Results  Component Value Date   VD25OH 48.9 04/09/2022     Review of Systems  Constitutional:  Negative for chills, fatigue and fever.  HENT:  Negative for congestion, hearing loss, tinnitus, trouble swallowing and voice change.   Eyes:  Negative for visual disturbance.  Respiratory:  Negative for cough, chest tightness, shortness of breath and wheezing.   Cardiovascular:  Positive for leg swelling (chronic unchanged). Negative for chest pain and  palpitations.  Gastrointestinal:  Positive for heartburn. Negative for abdominal pain, constipation, diarrhea and vomiting.  Endocrine: Negative for polydipsia and polyuria.  Genitourinary:  Positive for dysuria and urgency. Negative for frequency, genital sores, hematuria, vaginal bleeding and vaginal discharge.  Musculoskeletal:   Negative for arthralgias, gait problem and joint swelling.  Skin:  Negative for color change and rash.  Neurological:  Negative for dizziness, tremors, light-headedness and headaches.  Hematological:  Negative for adenopathy. Does not bruise/bleed easily.  Psychiatric/Behavioral:  Positive for depression. Negative for dysphoric mood and sleep disturbance. The patient is not nervous/anxious.     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) 08/04/2019   Esophageal stenosis 01/28/2018   Varicose veins of left leg with edema 07/29/2017   Dysphagia, oropharyngeal 04/30/2017   Lumbar degenerative disc disease 04/30/2017   Spinal stenosis of lumbar region 12/08/2016   Idiopathic scoliosis of lumbar region 09/09/2016   Chronic midline low back pain with right-sided sciatica 07/28/2016   GERD without esophagitis 07/27/2015   Intermittent vertigo 07/27/2015   Osteoporosis, senile 07/27/2015   Seasonal allergic rhinitis due to pollen 07/27/2015   Acquired hypothyroidism 01/05/2015   Benign essential hypertension 06/29/2014   Mixed hyperlipidemia 06/29/2014   Stage 3b chronic kidney disease (HCC) 06/29/2014   Insomnia 11/11/2013   Mild reactive airways disease 11/11/2013    Allergies  Allergen Reactions   Flagyl [Metronidazole Hcl] Swelling    Swelling in face, mouth, throat, palms of feet and hands.   Furosemide Itching    Made side of mouth irritated and puffy.  The rim inside her lip swelled up   Macrodantin [Nitrofurantoin Macrocrystal] Anaphylaxis, Hives and Swelling   Penicillins Anaphylaxis and Swelling    PATIENT HAD A PCN REACTION WITH IMMEDIATE RASH, FACIAL/TONGUE/THROAT SWELLING,  SOB, OR LIGHTHEADEDNESS WITH HYPOTENSION:  #  #  #  YES  #  #  #   Has patient had a PCN reaction causing severe rash involving mucus membranes or skin necrosis: No Has patient had a PCN reaction that required hospitalization: No Has patient had a PCN reaction occurring within the last 10 years: No If all of the above answers are "NO", then may proceed with Cephalosporin use.    Celebrex [Celecoxib] Swelling    Swelling in joints.  Did not agree with her. Did not help her either   Other     Pt says it was recently discovered she allergic to an ingredient in local anesthesia but she can't recall the name. (epinephrine and the additive used for the biopsy. Gets very nervous. Feels internally shaking. 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 Lance Coon FRACTURE SURGERY Left 2003   arch of foot.  no metal. per patient, she did not have surgery for this fracture. healed on its own   ANTERIOR LAT LUMBAR FUSION Left 09/09/2016   Procedure: Left Lumbar two-three Anterolateral lumbar interbody fusion with lateral plate;  Surgeon: Maeola Harman, MD;  Location: [facility] OR;  Service: Neurosurgery;  Laterality: Left;  Left L2-3 Anterolateral lumbar interbody fusion with lateral  plate   APPENDECTOMY     BACK SURGERY  2013, 2016, 2018   lumbar lam, fusion, discectomy.titanium   BIOPSY THYROID     x2  thyroid nodule ; Dr. Okey Dupre and one by Dr. Hyacinth Meeker   BREAST BIOPSY Left 08/02/2019   coil clip-INVASIVE MAMMARY CARCINOMA   BREAST CYST ASPIRATION  1970's   BREAST LUMPECTOMY Left 2021   BREAST LUMPECTOMY WITH RADIOFREQUENCY TAG IDENTIFICATION Left 08/12/2019   Procedure: BREAST LUMPECTOMY WITH RADIOFREQUENCY TAG IDENTIFICATION;  Surgeon: Campbell Lerner, MD;  Location: [facility] ORS;  Service: General;  Laterality: Left;   BREAST LUMPECTOMY WITH SENTINEL LYMPH NODE BIOPSY Left 08/12/2019    Procedure: BREAST LUMPECTOMY WITH SENTINEL LYMPH NODE BX;  Surgeon: Campbell Lerner, MD;  Location: [facility] ORS;  Service: General;  Laterality: Left;   broken arm Left 1994   wrist fracture surgery, left.  metal has been replaced.   CARDIOVASCULAR STRESS TEST     2010   COLONOSCOPY  2010   COLONOSCOPY  07/21/2013   PHX OF CP/REPEAT 75YRS/[place]   ESOPHAGOGASTRODUODENOSCOPY (EGD) WITH PROPOFOL N/A 09/22/2018   Procedure: ESOPHAGOGASTRODUODENOSCOPY (EGD) WITH PROPOFOL;  Surgeon: Toledo, Boykin Nearing, MD;  Location: [facility];  Service: Gastroenterology;  Laterality: N/A;  Patient to have Rapid COVID-19 day of procedure Boyd Kerbs approved this)   EYE SURGERY Bilateral    cataract surgery with lens implant   FOREIGN BODY REMOVAL N/A 12/09/2019   Procedure: FOREIGN BODY REMOVAL ADULT;  Surgeon: Kennedy Bucker, MD;  Location: [facility] OR[facility];  Service: Orthopedics;  Laterality: N/A;   INCISION AND DRAINAGE Left 12/09/2019   Procedure: INCISION AND DRAINAGE;  Surgeon: Kennedy Bucker, MD;  Location: [facility];  Service: Orthopedics;  Laterality: Left;   LACERATION REPAIR Bilateral 12/09/2019   Procedure: REPAIR MULTIPLE LACERATIONS;  Surgeon: Kennedy Bucker, MD;  Location: [facility];  Service: Orthopedics;  Laterality: Bilateral;   LUMBAR LAMINECTOMY/DECOMPRESSION MICRODISCECTOMY  11/18/2011   Procedure: LUMBAR LAMINECTOMY/DECOMPRESSION MICRODISCECTOMY 2 LEVELS;  Surgeon: Maeola Harman, MD;  Location: [facility];  Service: Neurosurgery;  Laterality: N/A;  Lumbar Three-Four,  Lumbar Four-Five Decompressive Laminectomy   MALONEY DILATION N/A 09/22/2018   Procedure: MALONEY DILATION;  Surgeon: Toledo, Boykin Nearing, MD;  Location: [facility];  Service: Gastroenterology;  Laterality: N/A;   OVARIAN CYST SURGERY     POSTERIOR FUSION LUMBAR SPINE  2016   TOTAL THYROIDECTOMY     TRANSTHORACIC ECHOCARDIOGRAM  2010   upper and lower GI  2010    Social History   Tobacco Use   Smoking status: Never   Smokeless tobacco: Never   Vaping Use   Vaping status: Never Used  Substance Use Topics   Alcohol use: Not Currently    Comment: socially-rare   Drug use: No     Medication list has been reviewed and updated.  Current Meds  Medication Sig   acetaminophen (TYLENOL) 650 MG CR tablet Take 1,300 mg by mouth every 8 (eight) hours as needed for pain.   anastrozole (ARIMIDEX) 1 MG tablet TAKE 1 TABLET EVERY DAY   Ascorbic Acid (VITAMIN C) 1000 MG tablet Take 1,000 mg by mouth daily.   atorvastatin (LIPITOR) 40 MG tablet TAKE 1 TABLET AT BEDTIME   Calcium Carbonate-Vitamin D 600-400 MG-UNIT tablet Take 2 tablets by mouth daily.   chlorthalidone (HYGROTON) 50 MG tablet Take 50 mg by mouth in the morning and at bedtime.   cholecalciferol (VITAMIN D3) 25 MCG (1000 UNIT) tablet Take 1,000 Units by mouth daily.   denosumab (PROLIA) 60 MG/ML  SOSY injection Inject 60 mg into the skin every 6 (six) months.   diclofenac Sodium (VOLTAREN) 1 % GEL Apply 2 g topically 4 (four) times daily.   fexofenadine (ALLEGRA) 180 MG tablet Take 180 mg by mouth daily.   fluticasone (FLONASE) 50 MCG/ACT nasal spray USE 2 SPRAYS INTO BOTH NOSTRILS DAILY AS NEEDED FOR ALLERGIES.   gabapentin (NEURONTIN) 300 MG capsule Take 300 mg by mouth 2 (two) times daily.   GLUCOSAMINE-CHONDROITIN PO Take 1 tablet by mouth daily.    irbesartan (AVAPRO) 300 MG tablet TAKE 1 TABLET EVERY DAY   levothyroxine (SYNTHROID) 150 MCG tablet Take by mouth.   Multiple Vitamin (MULTIVITAMIN WITH MINERALS) TABS Take 1 tablet by mouth daily.   omeprazole (PRILOSEC) 40 MG capsule TAKE 1 CAPSULE EVERY DAY BEFORE BREAKFAST   sertraline (ZOLOFT) 50 MG tablet TAKE 1 TABLET EVERY DAY   vitamin E 180 MG (400 UNITS) capsule Take 400 Units by mouth daily.   [DISCONTINUED] amLODipine (NORVASC) 2.5 MG tablet Take 2.5 mg by mouth daily.       10/09/2022    8:40 AM 07/30/2022    3:05 PM 04/09/2022   10:04 AM 03/26/2022    2:38 PM  GAD 7 : Generalized Anxiety Score  Nervous,  Anxious, on Edge 0 1 0 0  Control/stop worrying 0 1 0 0  Worry too much - different things 0 1 0 0  Trouble relaxing 0 0 0 0  Restless 0 0 0 0  Easily annoyed or irritable 0 1 0 0  Afraid - awful might happen 0 0 0 0  Total GAD 7 Score 0 4 0 0  Anxiety Difficulty Not difficult at all Not difficult at all Not difficult at all Not difficult at all       10/09/2022    8:40 AM 07/30/2022    3:05 PM 04/09/2022   10:04 AM  Depression screen PHQ 2/9  Decreased Interest 3 3 0  Down, Depressed, Hopeless 0 0 0  PHQ - 2 Score 3 3 0  Altered sleeping 0 0 0  Tired, decreased energy 2 3 0  Change in appetite 0 0 0  Feeling bad or failure about yourself  0 0 0  Trouble concentrating 0 0 0  Moving slowly or fidgety/restless 3 0 0  Suicidal thoughts 0 0 0  PHQ-9 Score 8 6 0  Difficult doing work/chores Somewhat difficult Not difficult at all Not difficult at all    BP Readings from Last 3 Encounters:  10/09/22 110/72  07/30/22 135/84  07/23/22 126/79    Physical Exam Vitals and nursing note reviewed.  Constitutional:      General: She is not in acute distress.    Appearance: She is well-developed.  HENT:     Head: Normocephalic and atraumatic.     Right Ear: Tympanic membrane and ear canal normal.     Left Ear: Tympanic membrane and ear canal normal.     Nose:     Right Sinus: No maxillary sinus tenderness.     Left Sinus: No maxillary sinus tenderness.  Eyes:     General: No scleral icterus.       Right eye: No discharge.        Left eye: No discharge.     Conjunctiva/sclera: Conjunctivae normal.  Neck:     Thyroid: No thyromegaly.     Vascular: No carotid bruit.  Cardiovascular:     Rate and Rhythm: Normal rate and regular rhythm.  Pulses: Normal pulses.     Heart sounds: Normal heart sounds.  Pulmonary:     Effort: Pulmonary effort is normal. No respiratory distress.     Breath sounds: No wheezing.  Chest:  Breasts:    Right: No mass, nipple discharge, skin change  or tenderness.     Left: No mass, nipple discharge, skin change or tenderness.  Abdominal:     General: Bowel sounds are normal.     Palpations: Abdomen is soft.     Tenderness: There is no abdominal tenderness.  Musculoskeletal:        General: Swelling present. Normal range of motion.     Cervical back: Normal range of motion. No erythema.     Right lower leg: No edema.     Left lower leg: No edema.  Lymphadenopathy:     Cervical: No cervical adenopathy.  Skin:    General: Skin is warm and dry.     Capillary Refill: Capillary refill takes less than 2 seconds.     Findings: No rash.  Neurological:     General: No focal deficit present.     Mental Status: She is alert and oriented to person, place, and time.     Cranial Nerves: No cranial nerve deficit.     Sensory: No sensory deficit.     Deep Tendon Reflexes: Reflexes are normal and symmetric.  Psychiatric:        Attention and Perception: Attention normal.        Mood and Affect: Mood normal.     Wt Readings from Last 3 Encounters:  10/09/22 174 lb (78.9 kg)  07/30/22 181 lb (82.1 kg)  07/23/22 178 lb 1.6 oz (80.8 kg)    BP 110/72   Pulse 85   Ht 5\' 3"  (1.6 m)   Wt 174 lb (78.9 kg)   SpO2 95%   BMI 30.82 kg/m   Assessment and Plan:  Problem List Items Addressed This Visit     Stage 3b chronic kidney disease (HCC) (Chronic)    Most recent GFR down to 25. Cardiology has held the amlodipine and doubled the chlorthalidone Plans for labs next week Will recheck labs today and advise patient - may need to see Nephrology      Relevant Orders   Comprehensive metabolic panel   Parathyroid hormone, intact (no Ca)   VITAMIN D 25 Hydroxy (Vit-D Deficiency, Fractures)   Mixed hyperlipidemia (Chronic)    LDL is  Lab Results  Component Value Date   LDLCALC 85 10/07/2021   Currently being treated with atorvastatin with good compliance and no concerns.       Relevant Orders   Lipid panel   History of breast  cancer    No change in exam; continues on Arimidex Due for mammogram this fall      GERD without esophagitis    Reflux symptoms are minimal on current therapy - omeprazole. No red flag signs such as weight loss, n/v, melena       Relevant Orders   CBC with Differential/Platelet   Benign essential hypertension (Chronic)    Normal exam with stable BP on avapro, amlodipine and chlorthalidone.  Amlodipine on hold by Cardiology for current workup No concerns or side effects to current medication. No change in regimen; continue low sodium diet. INTERPRETATION  NORMAL LEFT VENTRICULAR SYSTOLIC FUNCTION  NORMAL RIGHT VENTRICULAR SYSTOLIC FUNCTION  MILD VALVULAR REGURGITATION (See above)  NO VALVULAR STENOSIS  IRREGULAR HEART RHYTHM CAPTURED THROUGHOUT EXAM - heart  monitor planned LVEF WITHIN LOWER LIMITS OF NORMAL 50-55%  CALCULATED: 51.9%  NO SIGNIFICANT CHANGE FROM PREVIOUS ECHO        Relevant Orders   Comprehensive metabolic panel   CBC with Differential/Platelet   Acquired hypothyroidism (Chronic)    Supplemented.  Last TSH high and dose increased to 175 mcg. Repeat TSH now low and T4 slightly high so dose decreased back to 150 mcg by Endo      Relevant Medications   levothyroxine (SYNTHROID) 150 MCG tablet   Other Visit Diagnoses     Annual physical exam    -  Primary   Dysuria       UA positive will hold on treatment until culture returns   Relevant Orders   POCT urinalysis dipstick (Completed)   Urine Culture       Return in about 6 months (around 04/11/2023) for HTN, Depression.    Reubin Milan, MD [facility] [place]

## 2022-10-09 NOTE — Patient Instructions (Signed)
Call [facility] to schedule your mammogram at [phone].  

## 2022-10-10 ENCOUNTER — Telehealth: Payer: Self-pay | Admitting: Internal Medicine

## 2022-10-10 ENCOUNTER — Other Ambulatory Visit: Payer: Self-pay

## 2022-10-10 DIAGNOSIS — R300 Dysuria: Secondary | ICD-10-CM

## 2022-10-10 LAB — VITAMIN D 25 HYDROXY (VIT D DEFICIENCY, FRACTURES): Vit D, 25-Hydroxy: 58 ng/mL (ref 30.0–100.0)

## 2022-10-10 MED ORDER — CIPROFLOXACIN HCL 250 MG PO TABS
250.0000 mg | ORAL_TABLET | Freq: Two times a day (BID) | ORAL | 0 refills | Status: DC
Start: 2022-10-10 — End: 2023-02-10

## 2022-10-10 NOTE — Telephone Encounter (Signed)
ROUTED

## 2022-10-10 NOTE — Progress Notes (Signed)
Spoke with pt about results, voiced understanding, asked about urine, per dr berglund sent in Cipro 250 BID for 5 days, pt voiced understanding.

## 2022-10-10 NOTE — Telephone Encounter (Signed)
routed

## 2022-10-29 DIAGNOSIS — I10 Essential (primary) hypertension: Secondary | ICD-10-CM | POA: Diagnosis not present

## 2022-10-29 DIAGNOSIS — I499 Cardiac arrhythmia, unspecified: Secondary | ICD-10-CM | POA: Diagnosis not present

## 2022-11-03 DIAGNOSIS — M2011 Hallux valgus (acquired), right foot: Secondary | ICD-10-CM | POA: Diagnosis not present

## 2022-11-03 DIAGNOSIS — M2012 Hallux valgus (acquired), left foot: Secondary | ICD-10-CM | POA: Diagnosis not present

## 2022-11-03 DIAGNOSIS — M2041 Other hammer toe(s) (acquired), right foot: Secondary | ICD-10-CM | POA: Diagnosis not present

## 2022-11-03 DIAGNOSIS — M2042 Other hammer toe(s) (acquired), left foot: Secondary | ICD-10-CM | POA: Diagnosis not present

## 2022-11-25 DIAGNOSIS — R002 Palpitations: Secondary | ICD-10-CM | POA: Diagnosis not present

## 2022-11-27 DIAGNOSIS — E890 Postprocedural hypothyroidism: Secondary | ICD-10-CM | POA: Diagnosis not present

## 2022-11-27 DIAGNOSIS — C73 Malignant neoplasm of thyroid gland: Secondary | ICD-10-CM | POA: Diagnosis not present

## 2022-12-02 ENCOUNTER — Ambulatory Visit (INDEPENDENT_AMBULATORY_CARE_PROVIDER_SITE_OTHER): Payer: Medicare HMO | Admitting: Internal Medicine

## 2022-12-02 ENCOUNTER — Encounter: Payer: Self-pay | Admitting: Internal Medicine

## 2022-12-02 VITALS — BP 118/68 | HR 90 | Temp 98.7°F | Ht 63.0 in | Wt 174.0 lb

## 2022-12-02 DIAGNOSIS — J40 Bronchitis, not specified as acute or chronic: Secondary | ICD-10-CM

## 2022-12-02 DIAGNOSIS — U071 COVID-19: Secondary | ICD-10-CM | POA: Diagnosis not present

## 2022-12-02 MED ORDER — AZITHROMYCIN 250 MG PO TABS
ORAL_TABLET | ORAL | 0 refills | Status: AC
Start: 2022-12-02 — End: 2022-12-07

## 2022-12-02 MED ORDER — PROMETHAZINE-DM 6.25-15 MG/5ML PO SYRP
5.0000 mL | ORAL_SOLUTION | Freq: Four times a day (QID) | ORAL | 0 refills | Status: AC | PRN
Start: 2022-12-02 — End: 2022-12-11

## 2022-12-02 NOTE — Progress Notes (Signed)
Date:  12/02/2022   Name:  Latoya Stevenson   DOB:  Mar 03, 1941   MRN:  [id number]   Chief Complaint: Covid Positive (1 week cough, sore throat yesterday. Covid positive this morning. ) Started feeling poorly last week with cough and head congestion.  Coughing up green phlegm but no fever or shortness of breath.  Two days ago sore throat started but resolved with saline gargles.  Today took an expired Covid test +; took another test -.  She went very deep for the first test and only in the front of the nostrils on the second. Cough This is a new problem. The current episode started in the past 7 days. The problem has been gradually worsening. The problem occurs every few minutes. The cough is Productive of sputum. Associated symptoms include nasal congestion, postnasal drip and a sore throat. Pertinent negatives include no chest pain, chills, fever, headaches or shortness of breath.    Lab Results  Component Value Date   NA 140 10/09/2022   K 4.0 10/09/2022   CO2 21 10/09/2022   GLUCOSE 96 10/09/2022   BUN 30 (H) 10/09/2022   CREATININE 1.42 (H) 10/09/2022   CALCIUM 8.9 10/09/2022   EGFR 37 (L) 10/09/2022   GFRNONAA 39 (L) 10/24/2021   Lab Results  Component Value Date   CHOL 122 10/09/2022   HDL 36 (L) 10/09/2022   LDLCALC 68 10/09/2022   TRIG 94 10/09/2022   CHOLHDL 3.4 10/09/2022   Lab Results  Component Value Date   TSH 75.500 (H) 07/30/2022   No results found for: "HGBA1C" Lab Results  Component Value Date   WBC 5.0 10/09/2022   HGB 10.8 (L) 10/09/2022   HCT 33.0 (L) 10/09/2022   MCV 88 10/09/2022   PLT 168 10/09/2022   Lab Results  Component Value Date   ALT 18 10/09/2022   AST 19 10/09/2022   ALKPHOS 74 10/09/2022   BILITOT 0.7 10/09/2022   Lab Results  Component Value Date   VD25OH 58.0 10/09/2022     Review of Systems  Constitutional:  Negative for appetite change, chills and fever.  HENT:  Positive for congestion, postnasal drip, sinus pressure and  sore throat. Negative for trouble swallowing.   Respiratory:  Positive for cough. Negative for shortness of breath.   Cardiovascular:  Negative for chest pain and palpitations.  Gastrointestinal:  Negative for constipation, diarrhea, nausea and vomiting.  Neurological:  Negative for headaches.  Psychiatric/Behavioral:  Negative for dysphoric mood and sleep disturbance. The patient is not nervous/anxious.     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) 08/04/2019   Esophageal stenosis 01/28/2018   Varicose veins of left leg with edema 07/29/2017   Dysphagia, oropharyngeal 04/30/2017   Lumbar degenerative disc disease 04/30/2017   Spinal stenosis of lumbar region 12/08/2016   Idiopathic scoliosis of lumbar region 09/09/2016   Chronic midline low back pain with right-sided sciatica 07/28/2016   GERD without esophagitis 07/27/2015   Intermittent vertigo 07/27/2015   Osteoporosis, senile 07/27/2015   Seasonal allergic rhinitis due to pollen 07/27/2015   Acquired hypothyroidism 01/05/2015   Benign essential hypertension 06/29/2014   Mixed hyperlipidemia 06/29/2014   Stage 3b chronic kidney  disease (HCC) 06/29/2014   Insomnia 11/11/2013   Mild reactive airways disease 11/11/2013    Allergies  Allergen Reactions   Flagyl [Metronidazole Hcl] Swelling    Swelling in face, mouth, throat, palms of feet and hands.   Furosemide Itching    Made side of mouth irritated and puffy.  The rim inside her lip swelled up   Macrodantin [Nitrofurantoin Macrocrystal] Anaphylaxis, Hives and Swelling   Penicillins Anaphylaxis and Swelling    PATIENT HAD A PCN REACTION WITH IMMEDIATE RASH,  FACIAL/TONGUE/THROAT SWELLING, SOB, OR LIGHTHEADEDNESS WITH HYPOTENSION:  #  #  #  YES  #  #  #   Has patient had a PCN reaction causing severe rash involving mucus membranes or skin necrosis: No Has patient had a PCN reaction that required hospitalization: No Has patient had a PCN reaction occurring within the last 10 years: No If all of the above answers are "NO", then may proceed with Cephalosporin use.    Celebrex [Celecoxib] Swelling    Swelling in joints.  Did not agree with her. Did not help her either   Other     Pt says it was recently discovered she allergic to an ingredient in local anesthesia but she can't recall the name. (epinephrine and the additive used for the biopsy. Gets very nervous. Feels internally shaking. 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 Lance Coon FRACTURE SURGERY Left 2003   arch of foot.  no metal. per patient, she did not have surgery for this fracture. healed on its own   ANTERIOR LAT LUMBAR FUSION Left 09/09/2016   Procedure: Left Lumbar two-three Anterolateral lumbar interbody fusion with lateral plate;  Surgeon: Maeola Harman, MD;  Location: [facility] OR;  Service: Neurosurgery;  Laterality: Left;  Left L2-3 Anterolateral lumbar interbody fusion with lateral plate   APPENDECTOMY     BACK SURGERY  2013, 2016, 2018   lumbar lam, fusion, discectomy.titanium   BIOPSY THYROID     x2  thyroid nodule ; Dr. Okey Dupre and one by Dr. Hyacinth Meeker   BREAST BIOPSY Left 08/02/2019   coil clip-INVASIVE MAMMARY CARCINOMA   BREAST CYST ASPIRATION  1970's   BREAST LUMPECTOMY Left 2021   BREAST LUMPECTOMY WITH RADIOFREQUENCY TAG IDENTIFICATION Left 08/12/2019   Procedure: BREAST LUMPECTOMY WITH RADIOFREQUENCY TAG IDENTIFICATION;  Surgeon: Campbell Lerner, MD;  Location: [facility] ORS;  Service: General;  Laterality: Left;   BREAST LUMPECTOMY WITH SENTINEL LYMPH  NODE BIOPSY Left 08/12/2019   Procedure: BREAST LUMPECTOMY WITH SENTINEL LYMPH NODE BX;  Surgeon: Campbell Lerner, MD;  Location: [facility] ORS;  Service: General;  Laterality: Left;   broken arm Left 1994   wrist fracture surgery, left.  metal has been replaced.   CARDIOVASCULAR STRESS TEST     2010   COLONOSCOPY  2010   COLONOSCOPY  07/21/2013   PHX OF CP/REPEAT 70YRS/OH   ESOPHAGOGASTRODUODENOSCOPY (EGD) WITH PROPOFOL N/A 09/22/2018   Procedure: ESOPHAGOGASTRODUODENOSCOPY (EGD) WITH PROPOFOL;  Surgeon: Toledo, Boykin Nearing, MD;  Location: [facility];  Service: Gastroenterology;  Laterality: N/A;  Patient to have Rapid COVID-19 day of procedure Boyd Kerbs approved this)   EYE SURGERY Bilateral    cataract surgery with lens implant   FOREIGN BODY REMOVAL N/A 12/09/2019   Procedure: FOREIGN BODY REMOVAL ADULT;  Surgeon: Kennedy Bucker, MD;  Location: [facility] ORS;  Service: Orthopedics;  Laterality:  N/A;   INCISION AND DRAINAGE Left 12/09/2019   Procedure: INCISION AND DRAINAGE;  Surgeon: Kennedy Bucker, MD;  Location: [facility] ORS;  Service: Orthopedics;  Laterality: Left;   LACERATION REPAIR Bilateral 12/09/2019   Procedure: REPAIR MULTIPLE LACERATIONS;  Surgeon: Kennedy Bucker, MD;  Location: [facility] ORS;  Service: Orthopedics;  Laterality: Bilateral;   LUMBAR LAMINECTOMY/DECOMPRESSION MICRODISCECTOMY  11/18/2011   Procedure: LUMBAR LAMINECTOMY/DECOMPRESSION MICRODISCECTOMY 2 LEVELS;  Surgeon: Maeola Harman, MD;  Location: [facility];  Service: Neurosurgery;  Laterality: N/A;  Lumbar Three-Four,  Lumbar Four-Five Decompressive Laminectomy   MALONEY DILATION N/A 09/22/2018   Procedure: MALONEY DILATION;  Surgeon: Toledo, Boykin Nearing, MD;  Location: [facility];  Service: Gastroenterology;  Laterality: N/A;   OVARIAN CYST SURGERY     POSTERIOR FUSION LUMBAR SPINE  2016   TOTAL THYROIDECTOMY     TRANSTHORACIC ECHOCARDIOGRAM  2010   upper and lower GI  2010    Social History   Tobacco Use   Smoking status: Never    Smokeless tobacco: Never  Vaping Use   Vaping status: Never Used  Substance Use Topics   Alcohol use: Not Currently    Comment: socially-rare   Drug use: No     Medication list has been reviewed and updated.  Current Meds  Medication Sig   acetaminophen (TYLENOL) 650 MG CR tablet Take 1,300 mg by mouth every 8 (eight) hours as needed for pain.   anastrozole (ARIMIDEX) 1 MG tablet TAKE 1 TABLET EVERY DAY   Ascorbic Acid (VITAMIN C) 1000 MG tablet Take 1,000 mg by mouth daily.   atorvastatin (LIPITOR) 40 MG tablet TAKE 1 TABLET AT BEDTIME   azithromycin (ZITHROMAX Z-PAK) 250 MG tablet UAD   Calcium Carbonate-Vitamin D 600-400 MG-UNIT tablet Take 2 tablets by mouth daily.   chlorthalidone (HYGROTON) 50 MG tablet Take 1 tablet by mouth 2 (two) times daily.   cholecalciferol (VITAMIN D3) 25 MCG (1000 UNIT) tablet Take 1,000 Units by mouth daily.   ciprofloxacin (CIPRO) 250 MG tablet Take 1 tablet (250 mg total) by mouth 2 (two) times daily.   denosumab (PROLIA) 60 MG/ML SOSY injection Inject 60 mg into the skin every 6 (six) months.   diclofenac Sodium (VOLTAREN) 1 % GEL Apply 2 g topically 4 (four) times daily.   fexofenadine (ALLEGRA) 180 MG tablet Take 180 mg by mouth daily.   fluticasone (FLONASE) 50 MCG/ACT nasal spray USE 2 SPRAYS INTO BOTH NOSTRILS DAILY AS NEEDED FOR ALLERGIES.   gabapentin (NEURONTIN) 300 MG capsule Take 300 mg by mouth 2 (two) times daily.   GLUCOSAMINE-CHONDROITIN PO Take 1 tablet by mouth daily.    irbesartan (AVAPRO) 300 MG tablet TAKE 1 TABLET EVERY DAY   levothyroxine (SYNTHROID) 150 MCG tablet Take by mouth.   Multiple Vitamin (MULTIVITAMIN WITH MINERALS) TABS Take 1 tablet by mouth daily.   omeprazole (PRILOSEC) 40 MG capsule TAKE 1 CAPSULE EVERY DAY BEFORE BREAKFAST   promethazine-dextromethorphan (PROMETHAZINE-DM) 6.25-15 MG/5ML syrup Take 5 mLs by mouth 4 (four) times daily as needed for up to 9 days for cough.   sertraline (ZOLOFT) 50 MG tablet  TAKE 1 TABLET EVERY DAY   vitamin E 180 MG (400 UNITS) capsule Take 400 Units by mouth daily.   [DISCONTINUED] chlorthalidone (HYGROTON) 50 MG tablet Take 50 mg by mouth in the morning and at bedtime.       10/09/2022    8:40 AM 07/30/2022    3:05 PM 04/09/2022   10:04 AM 03/26/2022    2:38  PM  GAD 7 : Generalized Anxiety Score  Nervous, Anxious, on Edge 0 1 0 0  Control/stop worrying 0 1 0 0  Worry too much - different things 0 1 0 0  Trouble relaxing 0 0 0 0  Restless 0 0 0 0  Easily annoyed or irritable 0 1 0 0  Afraid - awful might happen 0 0 0 0  Total GAD 7 Score 0 4 0 0  Anxiety Difficulty Not difficult at all Not difficult at all Not difficult at all Not difficult at all       10/09/2022    8:40 AM 07/30/2022    3:05 PM 04/09/2022   10:04 AM  Depression screen PHQ 2/9  Decreased Interest 3 3 0  Down, Depressed, Hopeless 0 0 0  PHQ - 2 Score 3 3 0  Altered sleeping 0 0 0  Tired, decreased energy 2 3 0  Change in appetite 0 0 0  Feeling bad or failure about yourself  0 0 0  Trouble concentrating 0 0 0  Moving slowly or fidgety/restless 3 0 0  Suicidal thoughts 0 0 0  PHQ-9 Score 8 6 0  Difficult doing work/chores Somewhat difficult Not difficult at all Not difficult at all    BP Readings from Last 3 Encounters:  12/02/22 118/68  10/09/22 110/72  07/30/22 135/84    Physical Exam Constitutional:      Appearance: Normal appearance.  HENT:     Right Ear: Tympanic membrane normal.     Left Ear: Tympanic membrane normal.     Nose:     Right Sinus: Maxillary sinus tenderness present.     Left Sinus: Maxillary sinus tenderness present.     Mouth/Throat:     Pharynx: No posterior oropharyngeal erythema.  Cardiovascular:     Rate and Rhythm: Normal rate and regular rhythm.     Pulses: Normal pulses.  Pulmonary:     Effort: Pulmonary effort is normal.     Breath sounds: No wheezing or rhonchi.  Lymphadenopathy:     Cervical: No cervical adenopathy.  Skin:     General: Skin is warm and dry.  Neurological:     Mental Status: She is alert.     Wt Readings from Last 3 Encounters:  12/02/22 174 lb (78.9 kg)  10/09/22 174 lb (78.9 kg)  07/30/22 181 lb (82.1 kg)    BP 118/68   Pulse 90   Temp 98.7 F (37.1 C)   Ht 5\' 3"  (1.6 m)   Wt 174 lb (78.9 kg)   SpO2 97%   BMI 30.82 kg/m   Assessment and Plan:  Problem List Items Addressed This Visit   None Visit Diagnoses     Bronchitis due to 2019-nCoV    -  Primary   suspect she contracted Covid last week which progressed to sinusitis and bronchitis symptoms mild and too late for paxlovid. will treat cough and bronchitis;   Relevant Medications   promethazine-dextromethorphan (PROMETHAZINE-DM) 6.25-15 MG/5ML syrup   azithromycin (ZITHROMAX Z-PAK) 250 MG tablet       No follow-ups on file.    Reubin Milan, MD [facility] [place]

## 2022-12-02 NOTE — Patient Instructions (Signed)
Take Robitussin during the day.  Take tylenol as needed for sore throat.  Push fluids.

## 2022-12-11 DIAGNOSIS — I38 Endocarditis, valve unspecified: Secondary | ICD-10-CM | POA: Diagnosis not present

## 2022-12-11 DIAGNOSIS — I4729 Other ventricular tachycardia: Secondary | ICD-10-CM | POA: Diagnosis not present

## 2022-12-11 DIAGNOSIS — I493 Ventricular premature depolarization: Secondary | ICD-10-CM | POA: Diagnosis not present

## 2022-12-11 DIAGNOSIS — N1832 Chronic kidney disease, stage 3b: Secondary | ICD-10-CM | POA: Diagnosis not present

## 2022-12-11 DIAGNOSIS — I119 Hypertensive heart disease without heart failure: Secondary | ICD-10-CM | POA: Diagnosis not present

## 2022-12-11 DIAGNOSIS — I10 Essential (primary) hypertension: Secondary | ICD-10-CM | POA: Diagnosis not present

## 2022-12-11 DIAGNOSIS — E782 Mixed hyperlipidemia: Secondary | ICD-10-CM | POA: Diagnosis not present

## 2022-12-11 DIAGNOSIS — I491 Atrial premature depolarization: Secondary | ICD-10-CM | POA: Diagnosis not present

## 2022-12-15 ENCOUNTER — Ambulatory Visit: Payer: Medicare HMO | Admitting: Radiation Oncology

## 2023-01-05 DIAGNOSIS — Z8781 Personal history of (healed) traumatic fracture: Secondary | ICD-10-CM | POA: Diagnosis not present

## 2023-01-05 DIAGNOSIS — E890 Postprocedural hypothyroidism: Secondary | ICD-10-CM | POA: Diagnosis not present

## 2023-01-05 DIAGNOSIS — N1832 Chronic kidney disease, stage 3b: Secondary | ICD-10-CM | POA: Diagnosis not present

## 2023-01-05 DIAGNOSIS — C73 Malignant neoplasm of thyroid gland: Secondary | ICD-10-CM | POA: Diagnosis not present

## 2023-01-05 DIAGNOSIS — M810 Age-related osteoporosis without current pathological fracture: Secondary | ICD-10-CM | POA: Diagnosis not present

## 2023-01-07 ENCOUNTER — Ambulatory Visit: Payer: Medicare HMO

## 2023-01-07 VITALS — BP 138/72 | Ht 63.0 in | Wt 173.0 lb

## 2023-01-07 DIAGNOSIS — Z23 Encounter for immunization: Secondary | ICD-10-CM | POA: Diagnosis not present

## 2023-01-07 DIAGNOSIS — Z780 Asymptomatic menopausal state: Secondary | ICD-10-CM | POA: Diagnosis not present

## 2023-01-07 DIAGNOSIS — Z0000 Encounter for general adult medical examination without abnormal findings: Secondary | ICD-10-CM

## 2023-01-07 NOTE — Progress Notes (Signed)
Subjective:   Latoya Stevenson is a 82 y.o. female who presents for Medicare Annual (Subsequent) preventive examination.  Visit Complete: In person   Cardiac Risk Factors include: advanced age (>43men, >1 women);dyslipidemia;hypertension     Objective:    Today's Vitals   01/07/23 1427  BP: 138/72  Weight: 173 lb (78.5 kg)  Height: 5\' 3"  (1.6 m)   Body mass index is 30.65 kg/m.     01/07/2023    2:40 PM 07/23/2022    1:52 PM 01/22/2022   11:18 AM 12/12/2021    9:32 AM 10/24/2021    6:32 PM 10/09/2021    8:30 PM 10/09/2021    3:51 PM  Advanced Directives  Does Patient Have a Medical Advance Directive? Yes Yes No Yes No  No  Type of Estate agent of [place];Living will Healthcare Power of [place];Living will  Healthcare Power of [place];Living will     Does patient want to make changes to medical advance directive? No - Patient declined   No - Patient declined     Copy of Healthcare Power of Attorney in Chart? Yes - validated most recent copy scanned in chart (See row information)   No - copy requested     Would patient like information on creating a medical advance directive?   No - Patient declined  No - Patient declined No - Patient declined     Current Medications (verified) Outpatient Encounter Medications as of 01/07/2023  Medication Sig   acetaminophen (TYLENOL) 650 MG CR tablet Take 1,300 mg by mouth every 8 (eight) hours as needed for pain.   anastrozole (ARIMIDEX) 1 MG tablet TAKE 1 TABLET EVERY DAY   Ascorbic Acid (VITAMIN C) 1000 MG tablet Take 1,000 mg by mouth daily.   atorvastatin (LIPITOR) 40 MG tablet TAKE 1 TABLET AT BEDTIME   Calcium Carbonate-Vitamin D 600-400 MG-UNIT tablet Take 2 tablets by mouth daily.   chlorthalidone (HYGROTON) 50 MG tablet Take 1 tablet by mouth 2 (two) times daily.   cholecalciferol (VITAMIN D3) 25 MCG (1000 UNIT) tablet Take 1,000 Units by mouth daily.   denosumab (PROLIA) 60 MG/ML SOSY injection Inject 60 mg  into the skin every 6 (six) months.   diclofenac Sodium (VOLTAREN) 1 % GEL Apply 2 g topically 4 (four) times daily.   fexofenadine (ALLEGRA) 180 MG tablet Take 180 mg by mouth daily.   fluticasone (FLONASE) 50 MCG/ACT nasal spray USE 2 SPRAYS INTO BOTH NOSTRILS DAILY AS NEEDED FOR ALLERGIES.   gabapentin (NEURONTIN) 300 MG capsule Take 300 mg by mouth 2 (two) times daily.   GLUCOSAMINE-CHONDROITIN PO Take 1 tablet by mouth daily.    irbesartan (AVAPRO) 300 MG tablet TAKE 1 TABLET EVERY DAY   levothyroxine (SYNTHROID) 150 MCG tablet Take by mouth.   Multiple Vitamin (MULTIVITAMIN WITH MINERALS) TABS Take 1 tablet by mouth daily.   omeprazole (PRILOSEC) 40 MG capsule TAKE 1 CAPSULE EVERY DAY BEFORE BREAKFAST   sertraline (ZOLOFT) 50 MG tablet TAKE 1 TABLET EVERY DAY   vitamin E 180 MG (400 UNITS) capsule Take 400 Units by mouth daily.   ciprofloxacin (CIPRO) 250 MG tablet Take 1 tablet (250 mg total) by mouth 2 (two) times daily. (Patient not taking: Reported on 01/07/2023)   No facility-administered encounter medications on file as of 01/07/2023.    Allergies (verified) Flagyl [metronidazole hcl], Furosemide, Macrodantin [nitrofurantoin macrocrystal], Penicillins, Celebrex [celecoxib], and Other   History: Past Medical History:  Diagnosis Date   Allergic rhinitis  Anemia    low iron   Anxiety    Arthritis 07/2019   osteoporosis   Asthma without status asthmaticus    seasonal, not often   Bilateral sciatica    Breast cancer (HCC) 07/2019   invasive mammary cancer , grade 2   Bronchitis    Cancer (HCC)     follicular variant papillary thyroid cancer   Chronic back pain    unspecified   Chronic kidney disease    ckd stage 3b   Colon polyp    adenomatous   DDD (degenerative disc disease), lumbar    DJD (degenerative joint disease)    Dysphagia    Dyspnea    E coli bacteremia    around 10/11   Environmental allergies    Family history of breast cancer    Family history  of colon cancer    Family history of ovarian cancer    Family history of pancreatic cancer    Family history of prostate cancer    Family history of thyroid cancer    GERD (gastroesophageal reflux disease)    Headache    migraines as a young woman   History of recurrent UTIs    Hyperlipidemia    Hypertension    Hypothyroidism    IBS (irritable bowel syndrome)    Lumbar compression fracture (HCC) 12/02/2016   Lumbar disc disease    Mold exposure 2002   hx of    Osteoporosis, post-menopausal    Personal history of radiation therapy    Pneumonia 1978   Restless legs    Scoliosis    Spinal stenosis    Thyroid nodule    aspirated in 1997 and 2003 which were negative   Ulcer    Varicose veins of left lower extremity    Vertigo    Wears dentures    Past Surgical History:  Procedure Laterality Date   ABDOMINAL HYSTERECTOMY  1977   Dr. Lance Coon FRACTURE SURGERY Left 2003   arch of foot.  no metal. per patient, she did not have surgery for this fracture. healed on its own   ANTERIOR LAT LUMBAR FUSION Left 09/09/2016   Procedure: Left Lumbar two-three Anterolateral lumbar interbody fusion with lateral plate;  Surgeon: Maeola Harman, MD;  Location[facility];  Service: Neurosurgery;  Laterality: Left;  Left L2-3 Anterolateral lumbar interbody fusion with lateral plate   APPENDECTOMY     BACK SURGERY  2013, 2016, 2018   lumbar lam, fusion, discectomy.titanium   BIOPSY THYROID     x2  thyroid nodule ; Dr. Okey Dupre and one by Dr. Hyacinth Meeker   BREAST BIOPSY Left 08/02/2019   coil clip-INVASIVE MAMMARY CARCINOMA   BREAST CYST ASPIRATION  1970's   BREAST LUMPECTOMY Left 2021   BREAST LUMPECTOMY WITH RADIOFREQUENCY TAG IDENTIFICATION Left 08/12/2019   Procedure: BREAST LUMPECTOMY WITH RADIOFREQUENCY TAG IDENTIFICATION;  Surgeon: Campbell Lerner, MD;  Location: [facility] ORS;  Service: General;  Laterality: Left;   BREAST LUMPECTOMY WITH SENTINEL LYMPH NODE BIOPSY Left 08/12/2019   Procedure:  BREAST LUMPECTOMY WITH SENTINEL LYMPH NODE BX;  Surgeon: Campbell Lerner, MD;  Location: [facility] ORS;  Service: General;  Laterality: Left;   broken arm Left 1994   wrist fracture surgery, left.  metal has been replaced.   CARDIOVASCULAR STRESS TEST     2010   COLONOSCOPY  2010   COLONOSCOPY  07/21/2013   PHX OF CP/REPEAT 87YRS/[place]   ESOPHAGOGASTRODUODENOSCOPY (EGD) WITH PROPOFOL N/A 09/22/2018   Procedure: ESOPHAGOGASTRODUODENOSCOPY (EGD)  WITH PROPOFOL;  Surgeon: Toledo, Boykin Nearing, MD;  Location: [facility] ENDOSCOPY;  Service: Gastroenterology;  Laterality: N/A;  Patient to have Rapid COVID-19 day of procedure Boyd Kerbs approved this)   EYE SURGERY Bilateral    cataract surgery with lens implant   FOREIGN BODY REMOVAL N/A 12/09/2019   Procedure: FOREIGN BODY REMOVAL ADULT;  Surgeon: Kennedy Bucker, MD;  Location: [facility] ORS;  Service: Orthopedics;  Laterality: N/A;   INCISION AND DRAINAGE Left 12/09/2019   Procedure: INCISION AND DRAINAGE;  Surgeon: Kennedy Bucker, MD;  Location: [facility] ORS;  Service: Orthopedics;  Laterality: Left;   LACERATION REPAIR Bilateral 12/09/2019   Procedure: REPAIR MULTIPLE LACERATIONS;  Surgeon: Kennedy Bucker, MD;  Location: [facility] ORS;  Service: Orthopedics;  Laterality: Bilateral;   LUMBAR LAMINECTOMY/DECOMPRESSION MICRODISCECTOMY  11/18/2011   Procedure: LUMBAR LAMINECTOMY/DECOMPRESSION MICRODISCECTOMY 2 LEVELS;  Surgeon: Maeola Harman, MD;  Location: [facility];  Service: Neurosurgery;  Laterality: N/A;  Lumbar Three-Four,  Lumbar Four-Five Decompressive Laminectomy   MALONEY DILATION N/A 09/22/2018   Procedure: MALONEY DILATION;  Surgeon: Toledo, Boykin Nearing, MD;  Location: [facility] END[facility];  Service: Gastroenterology;  Laterality: N/A;   OVARIAN CYST SURGERY     POSTERIOR FUSION LUMBAR SPINE  2016   TOTAL THYROIDECTOMY     TRANSTHORACIC ECHOCARDIOGRAM  2010   upper and lower GI  2010   Family History  Problem Relation Age of Onset   Breast cancer Mother 2   Thyroid cancer Mother     Pancreatic cancer Brother    Pancreatic cancer Sister    Suicidality Father    Prostate cancer Brother    Colon cancer Brother    Breast cancer Sister        x 2 times   Heart attack Sister    Ovarian cancer Maternal Aunt    Breast cancer Paternal Aunt    Lung cancer Paternal Uncle    Breast cancer Other    Colon cancer Maternal Aunt    Pancreatic cancer Maternal Aunt    Social History   Socioeconomic History   Marital status: Widowed    Spouse name: Not on file   Number of children: 2   Years of education: Not on file   Highest education level: Not on file  Occupational History   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   Social Determinants of Health   Financial Resource Strain: Low Risk  (01/07/2023)   Overall Financial Resource Strain (CARDIA)    Difficulty of Paying Living Expenses: Not hard at all  Food Insecurity: No Food Insecurity (01/07/2023)   Hunger Vital Sign    Worried About Running Out of Food in the Last Year: Never true    Ran Out of Food in the Last Year: Never true  Transportation Needs: No Transportation Needs (01/07/2023)   PRAPARE - Administrator, Civil Service (Medical): No    Lack of Transportation (Non-Medical): No  Physical Activity: Insufficiently Active (01/07/2023)   Exercise Vital Sign    Days of Exercise per  Week: 3 days    Minutes of Exercise per Session: 30 min  Stress: No Stress Concern Present (01/07/2023)   [employer] of Occupational Health - Occupational Stress Questionnaire    Feeling of  Stress : Not at all  Social Connections: Socially Isolated (01/07/2023)   Social Connection and Isolation Panel [NHANES]    Frequency of Communication with Friends and Family: More than three times a week    Frequency of Social Gatherings with Friends and Family: Three times a week    Attends Religious Services: Never    Active Member of Clubs or Organizations: No    Attends Banker Meetings: Never    Marital Status: Widowed    Tobacco Counseling Counseling given: Not Answered   Clinical Intake:  Pre-visit preparation completed: Yes  Pain : No/denies pain     Nutritional Status: BMI > 30  Obese Nutritional Risks: None Diabetes: No  How often do you need to have someone help you when you read instructions, pamphlets, or other written materials from your doctor or pharmacy?: 1 - Never  Interpreter Needed?: No  Information entered by :: Kennedy Bucker, LPN   Activities of Daily Living    01/07/2023    2:44 PM  In your present state of health, do you have any difficulty performing the following activities:  Hearing? 0  Vision? 0  Difficulty concentrating or making decisions? 0  Walking or climbing stairs? 1  Dressing or bathing? 0  Doing errands, shopping? 0  Preparing Food and eating ? N  Using the Toilet? N  In the past six months, have you accidently leaked urine? N  Do you have problems with loss of bowel control? N  Managing your Medications? N  Managing your Finances? N  Housekeeping or managing your Housekeeping? N    Patient Care Team: Reubin Milan, MD as PCP - General (Internal Medicine) Creig Hines, MD as Consulting Physician (Oncology) Carmina Miller, MD as Referring Physician (Radiation Oncology) Campbell Lerner, MD as Consulting Physician (General Surgery) Jim Like, RN as Registered Nurse Patterson Hammersmith, MD as Consulting Physician (Rheumatology) Tedd Sias Marlana Salvage, MD as Physician Assistant (Endocrinology) Dalia Heading, MD as Consulting Physician (Cardiology) Stanton Kidney, MD as Consulting Physician (Gastroenterology) Linus Galas, DPM (Podiatry) Dingeldein, Viviann Spare, MD (Ophthalmology)  Indicate any recent Medical Services you may have received from other than [facility] providers in the past year (date may be approximate).     Assessment:   This is a routine wellness examination for [facility].  Hearing/Vision screen Hearing Screening - Comments:: No aids Vision Screening - Comments:: Readers- Dr.Dingeldein    Goals Addressed             This Visit's Progress    DIET - EAT MORE FRUITS AND VEGETABLES         Depression Screen    01/07/2023    2:35 PM 10/09/2022    8:40 AM 07/30/2022    3:05 PM 04/09/2022   10:04 AM 03/26/2022    2:37 PM 01/01/2022    3:13 PM 11/15/2021   10:56 AM  PHQ 2/9 Scores  PHQ - 2 Score 0 3 3 0 0 0 0  PHQ- 9 Score 0 8 6 0 5 0 3    Fall Risk    01/07/2023    2:43 PM 10/09/2022    8:40 AM 07/30/2022    3:04 PM 04/09/2022   10:04 AM 03/26/2022    2:38 PM  Fall  Risk   Falls in the past year? 1 0 1 0 0  Number falls in past yr: 1 0 1 0 0  Injury with Fall? 0 0 1 0 0  Risk for fall due to : History of fall(s) No Fall Risks History of fall(s) No Fall Risks No Fall Risks  Follow up Falls prevention discussed;Falls evaluation completed Falls evaluation completed Falls evaluation completed Falls evaluation completed Falls evaluation completed    MEDICARE RISK AT HOME: Medicare Risk at Home Any stairs in or around the home?: Yes If so, are there any without handrails?: No Home free of loose throw rugs in walkways, pet beds, electrical cords, etc?: Yes Adequate lighting in your home to reduce risk of falls?: Yes Life alert?: No Use of a cane, walker or w/c?: Yes (cane when dizzy) Grab bars in the bathroom?: Yes Shower chair or bench in shower?: No Elevated toilet seat or a handicapped toilet?: Yes  TIMED UP AND GO:  Was the test performed?  Yes  Length of time to  ambulate 10 feet: 4 sec Gait steady and fast without use of assistive device    Cognitive Function:        01/07/2023    2:47 PM 01/01/2022    3:15 PM  6CIT Screen  What Year? 0 points 0 points  What month? 0 points 3 points  What time? 0 points 0 points  Count back from 20 0 points 0 points  Months in reverse 2 points 0 points  Repeat phrase 0 points 2 points  Total Score 2 points 5 points    Immunizations Immunization History  Administered Date(s) Administered   Fluad Quad(high Dose 65+) 01/01/2022   Influenza Split 02/15/2014, 01/05/2015   Influenza,inj,Quad PF,6+ Mos 03/08/2019   Influenza-Unspecified 01/28/2017, 02/12/2021   PFIZER(Purple Top)SARS-COV-2 Vaccination 05/31/2019, 06/21/2019, 04/03/2020   PNEUMOCOCCAL CONJUGATE-20 10/07/2021   Pneumococcal Conjugate-13 07/31/2016   Tdap 08/09/2014    TDAP status: Up to date  Flu Vaccine status: Completed at today's visit  Pneumococcal vaccine status: Up to date  Covid-19 vaccine status: Completed vaccines  Qualifies for Shingles Vaccine? Yes   Zostavax completed No   Shingrix Completed?: No.    Education has been provided regarding the importance of this vaccine. Patient has been advised to call insurance company to determine out of pocket expense if they have not yet received this vaccine. Advised may also receive vaccine at local pharmacy or Health Dept. Verbalized acceptance and understanding.  Screening Tests Health Maintenance  Topic Date Due   Zoster Vaccines- Shingrix (1 of 2) Never done   INFLUENZA VACCINE  10/16/2022   COVID-19 Vaccine (4 - 2023-24 season) 11/16/2022   Medicare Annual Wellness (AWV)  01/07/2024   DTaP/Tdap/Td (2 - Td or Tdap) 08/08/2024   Pneumonia Vaccine 7+ Years old  Completed   DEXA SCAN  Completed   HPV VACCINES  Aged Out   Hepatitis C Screening  Discontinued    Health Maintenance  Health Maintenance Due  Topic Date Due   Zoster Vaccines- Shingrix (1 of 2) Never done    INFLUENZA VACCINE  10/16/2022   COVID-19 Vaccine (4 - 2023-24 season) 11/16/2022    Colorectal cancer screening: No longer required.   Mammogram status: No longer required due to age.  Bone Density status: Completed 11/28/19. Results reflect: Bone density results: OSTEOPOROSIS. Repeat every 2 years.  Lung Cancer Screening: (Low Dose CT Chest recommended if Age 105-80 years, 20 pack-year currently smoking OR have quit w/in 15years.)  does not qualify.    Additional Screening:  Hepatitis C Screening: does not qualify; Completed 10/07/21  Vision Screening: Recommended annual ophthalmology exams for early detection of glaucoma and other disorders of the eye. Is the patient up to date with their annual eye exam?  Yes  Who is the provider or what is the name of the office in which the patient attends annual eye exams? Dr.Dingeldein If pt is not established with a provider, would they like to be referred to a provider to establish care? No .   Dental Screening: Recommended annual dental exams for proper oral hygiene   Community Resource Referral / Chronic Care Management: CRR required this visit?  No   CCM required this visit?  No     Plan:     I have personally reviewed and noted the following in the patient's chart:   Medical and social history Use of alcohol, tobacco or illicit drugs  Current medications and supplements including opioid prescriptions. Patient is not currently taking opioid prescriptions. Functional ability and status Nutritional status Physical activity Advanced directives List of other physicians Hospitalizations, surgeries, and ER visits in previous 12 months Vitals Screenings to include cognitive, depression, and falls Referrals and appointments  In addition, I have reviewed and discussed with patient certain preventive protocols, quality metrics, and best practice recommendations. A written personalized care plan for preventive services as well as  general preventive health recommendations were provided to patient.     Hal Hope, LPN   16/12/9602   After Visit Summary: declined  Nurse Notes: none

## 2023-01-07 NOTE — Patient Instructions (Addendum)
Latoya Stevenson , Thank you for taking time to come for your Medicare Wellness Visit. I appreciate your ongoing commitment to your health goals. Please review the following plan we discussed and let me know if I can assist you in the future.   Referrals/Orders/Follow-Ups/Clinician Recommendations: bone density scan ordered  This is a list of the screening recommended for you and due dates:  Health Maintenance  Topic Date Due   Zoster (Shingles) Vaccine (1 of 2) Never done   Flu Shot  10/16/2022   COVID-19 Vaccine (4 - 2023-24 season) 11/16/2022   Medicare Annual Wellness Visit  01/07/2024   DTaP/Tdap/Td vaccine (2 - Td or Tdap) 08/08/2024   Pneumonia Vaccine  Completed   DEXA scan (bone density measurement)  Completed   HPV Vaccine  Aged Out   Hepatitis C Screening  Discontinued    Advanced directives: (In Chart) A copy of your advanced directives are scanned into your chart should your provider ever need it.  Next Medicare Annual Wellness Visit scheduled for next year: Yes    01/20/24 @ 1:40 pm in person

## 2023-01-14 DIAGNOSIS — I10 Essential (primary) hypertension: Secondary | ICD-10-CM | POA: Diagnosis not present

## 2023-01-14 DIAGNOSIS — N1832 Chronic kidney disease, stage 3b: Secondary | ICD-10-CM | POA: Diagnosis not present

## 2023-01-15 ENCOUNTER — Other Ambulatory Visit: Payer: Self-pay | Admitting: Nephrology

## 2023-01-15 DIAGNOSIS — N1832 Chronic kidney disease, stage 3b: Secondary | ICD-10-CM

## 2023-01-19 ENCOUNTER — Ambulatory Visit
Admission: RE | Admit: 2023-01-19 | Discharge: 2023-01-19 | Disposition: A | Discharge status: Home / Self Care | Payer: Medicare HMO | Source: Ambulatory Visit | Attending: Radiation Oncology | Admitting: Radiation Oncology

## 2023-01-19 ENCOUNTER — Encounter: Payer: Self-pay | Admitting: Radiation Oncology

## 2023-01-19 VITALS — Temp 97.7°F | Resp 16 | Ht 63.0 in | Wt 172.8 lb

## 2023-01-19 DIAGNOSIS — C50312 Malignant neoplasm of 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: Secondary | ICD-10-CM | POA: Diagnosis not present

## 2023-01-19 DIAGNOSIS — R0989 Other specified symptoms and signs involving the circulatory and respiratory systems: Secondary | ICD-10-CM | POA: Diagnosis not present

## 2023-01-19 DIAGNOSIS — C50012 Malignant neoplasm of nipple and areola, left female breast: Secondary | ICD-10-CM | POA: Insufficient documentation

## 2023-01-19 DIAGNOSIS — Z79811 Long term (current) use of aromatase inhibitors: Secondary | ICD-10-CM | POA: Diagnosis not present

## 2023-01-19 DIAGNOSIS — Z170 Estrogen receptor positive status [ER+]: Secondary | ICD-10-CM | POA: Diagnosis not present

## 2023-01-19 DIAGNOSIS — Z923 Personal history of irradiation: Secondary | ICD-10-CM | POA: Diagnosis not present

## 2023-01-19 NOTE — Progress Notes (Signed)
.  Radiation Oncology Follow up Note  Name: Latoya Stevenson   Date:   01/19/2023 MRN:  [id number] DOB: 03-10-1941    This 82 y.o. 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 She received whole breast radiation and is currently on Arimidex (anastrozole), which she tolerates well without significant side effects. Her last mammogram was in November of the previous year, which was BIRADS 2 benign, and she is scheduled for a repeat mammogram this November.  In addition to her cancer history, the patient reports tenderness in her legs and feet, which she attributes to poor circulation. She also recently discovered she has kidney disease and a problem with the left side of her heart. She is currently under the care of a nephrologist and is seeking a cardiologist for her heart condition..  COMPLICATIONS OF TREATMENT: none  FOLLOW UP COMPLIANCE: keeps appointments   PHYSICAL EXAM:  Temp 97.7 F (36.5 C)   Resp 16   Ht 5\' 3"  (1.6 m)   Wt 172 lb 12.8 oz (78.4 kg)   BMI 30.61 kg/m  Lungs are clear to A&P cardiac examination essentially unremarkable with regular rate and rhythm. No dominant mass or nodularity is noted in either breast in 2 positions examined. Incision is well-healed. No axillary or supraclavicular adenopathy is appreciated. Cosmetic result is excellent.  Well-developed well-nourished patient in NAD. HEENT reveals PERLA, EOMI, discs not visualized.  Oral cavity is clear. No oral mucosal lesions are identified. Neck is clear without evidence of cervical or supraclavicular adenopathy. Lungs are clear to A&P. Cardiac examination is essentially unremarkable  with regular rate and rhythm without murmur rub or thrill. Abdomen is benign with no organomegaly or masses noted. Motor sensory and DTR levels are equal and symmetric in the upper and lower extremities. Cranial nerves II through XII are grossly intact. Proprioception is intact. No peripheral adenopathy or edema is identified. No motor or sensory levels are noted. Crude visual fields are within normal range.  RADIOLOGY RESULTS: RADIOLOGY Mammogram: BIRADS 2 benign (November 2023)  PLAN: Stage 1A Invasive Mammary Carcinoma No evidence of disease three years post whole breast radiation. Mammograms last November were BIRADS 2 benign. Currently on Arimidex (Anastrozole) without significant side effects. -Continue Arimidex. -Repeat mammogram scheduled for this November. -Discontinue follow-up with oncology, but available for any future concerns.  Newly Diagnosed Kidney Disease Recently diagnosed and under the care of a nephrologist. -No specific plan discussed in this conversation.  Newly Diagnosed Left Heart Disease Recently diagnosed, under the care of a PA. -No specific plan discussed in this conversation    Carmina Miller, MD

## 2023-01-21 DIAGNOSIS — M810 Age-related osteoporosis without current pathological fracture: Secondary | ICD-10-CM | POA: Diagnosis not present

## 2023-01-22 ENCOUNTER — Ambulatory Visit
Admission: RE | Admit: 2023-01-22 | Discharge: 2023-01-22 | Disposition: A | Discharge status: Home / Self Care | Payer: Medicare HMO | Source: Ambulatory Visit | Attending: Nephrology | Admitting: Nephrology

## 2023-01-22 ENCOUNTER — Other Ambulatory Visit: Payer: Medicare HMO

## 2023-01-22 DIAGNOSIS — N1330 Unspecified hydronephrosis: Secondary | ICD-10-CM | POA: Diagnosis not present

## 2023-01-22 DIAGNOSIS — N1832 Chronic kidney disease, stage 3b: Secondary | ICD-10-CM | POA: Insufficient documentation

## 2023-01-23 ENCOUNTER — Inpatient Hospital Stay: Payer: Medicare HMO | Attending: Oncology | Admitting: Oncology

## 2023-02-02 ENCOUNTER — Ambulatory Visit
Admission: RE | Admit: 2023-02-02 | Discharge: 2023-02-02 | Disposition: A | Discharge status: Home / Self Care | Payer: Medicare HMO | Source: Ambulatory Visit | Attending: Oncology | Admitting: Oncology

## 2023-02-02 DIAGNOSIS — Z853 Personal history of malignant neoplasm of breast: Secondary | ICD-10-CM | POA: Insufficient documentation

## 2023-02-02 DIAGNOSIS — Z1231 Encounter for screening mammogram for malignant neoplasm of breast: Secondary | ICD-10-CM | POA: Insufficient documentation

## 2023-02-02 DIAGNOSIS — Z08 Encounter for follow-up examination after completed treatment for malignant neoplasm: Secondary | ICD-10-CM | POA: Diagnosis not present

## 2023-02-09 DIAGNOSIS — N1832 Chronic kidney disease, stage 3b: Secondary | ICD-10-CM | POA: Diagnosis not present

## 2023-02-09 DIAGNOSIS — I10 Essential (primary) hypertension: Secondary | ICD-10-CM | POA: Diagnosis not present

## 2023-02-10 ENCOUNTER — Encounter: Payer: Self-pay | Admitting: Surgery

## 2023-02-10 ENCOUNTER — Ambulatory Visit: Payer: Medicare HMO | Admitting: Surgery

## 2023-02-10 VITALS — BP 130/70 | HR 66 | Temp 98.0°F | Ht 63.0 in | Wt 170.0 lb

## 2023-02-10 DIAGNOSIS — M2011 Hallux valgus (acquired), right foot: Secondary | ICD-10-CM | POA: Diagnosis not present

## 2023-02-10 DIAGNOSIS — Z853 Personal history of malignant neoplasm of breast: Secondary | ICD-10-CM

## 2023-02-10 DIAGNOSIS — M9271 Juvenile osteochondrosis of metatarsus, right foot: Secondary | ICD-10-CM | POA: Diagnosis not present

## 2023-02-10 DIAGNOSIS — M2012 Hallux valgus (acquired), left foot: Secondary | ICD-10-CM | POA: Diagnosis not present

## 2023-02-10 DIAGNOSIS — N1339 Other hydronephrosis: Secondary | ICD-10-CM | POA: Diagnosis not present

## 2023-02-10 DIAGNOSIS — L97512 Non-pressure chronic ulcer of other part of right foot with fat layer exposed: Secondary | ICD-10-CM | POA: Diagnosis not present

## 2023-02-10 DIAGNOSIS — N1832 Chronic kidney disease, stage 3b: Secondary | ICD-10-CM | POA: Diagnosis not present

## 2023-02-10 DIAGNOSIS — M79671 Pain in right foot: Secondary | ICD-10-CM | POA: Diagnosis not present

## 2023-02-10 DIAGNOSIS — M2042 Other hammer toe(s) (acquired), left foot: Secondary | ICD-10-CM | POA: Diagnosis not present

## 2023-02-10 DIAGNOSIS — I10 Essential (primary) hypertension: Secondary | ICD-10-CM | POA: Diagnosis not present

## 2023-02-10 DIAGNOSIS — M2041 Other hammer toe(s) (acquired), right foot: Secondary | ICD-10-CM | POA: Diagnosis not present

## 2023-02-10 NOTE — Patient Instructions (Addendum)
Patient will be asked to return to the office in one year with a bilateral screening mammogram.  Continue self breast exams. Call office for any new breast issues or concerns.

## 2023-02-10 NOTE — Progress Notes (Signed)
Surgical Clinic Progress/Follow-up Note   HPI:  82 y.o. Female presents to clinic for left breast cancer follow-up.  She reports no issues with wither breast.   She continues tolerating her Arimidex w/o issue.  Still on Prolia.  Review of Systems:  Constitutional: denies fever/chills  ENT: denies sore throat, hearing problems  Respiratory: denies shortness of breath, wheezing  Cardiovascular: denies chest pain, palpitations  Gastrointestinal: denies abdominal pain, N/V, or diarrhea Skin: Denies any other rashes or skin discolorations  Vital Signs:  BP 130/70   Pulse 66   Temp 98 F (36.7 C)   Ht 5\' 3"  (1.6 m)   Wt 170 lb (77.1 kg)   SpO2 98%   BMI 30.11 kg/m    Physical Exam:  Constitutional:  -- Normal body habitus  -- Awake, alert, and oriented x3  Pulmonary:  -- No crackles -- Equal breath sounds bilaterally -- Breathing non-labored at rest Cardiovascular:  -- S1, S2 present  -- No pericardial rubs  Gastrointestinal:  -- Soft and non-distended, non-tender/with no tenderness to palpation, no guarding/rebound tenderness GU  --bilateral breast exam unremarkable for any skin changes, suspicious or dominant nodularity.  Her left central-areolar scar is well-healed Musculoskeletal / Integumentary:  -- Wounds or skin discoloration: None appreciated    Laboratory studies: Radiology review:   Imaging:  CLINICAL DATA:  Screening.   EXAM: DIGITAL SCREENING BILATERAL MAMMOGRAM WITH TOMOSYNTHESIS AND CAD   TECHNIQUE: Bilateral screening digital craniocaudal and mediolateral oblique mammograms were obtained. Bilateral screening digital breast tomosynthesis was performed. The images were evaluated with computer-aided detection.   COMPARISON:  Previous exam(s).   ACR Breast Density Category c: The breasts are heterogeneously dense, which may obscure small masses.   FINDINGS: There are no findings suspicious for malignancy.   IMPRESSION: No mammographic evidence of  malignancy. A result letter of this screening mammogram will be mailed directly to the patient.   RECOMMENDATION: Screening mammogram in one year. (Code:[id number]-B-01Y)   BI-RADS CATEGORY  1: Negative.     Electronically Signed   By: Norva Pavlov M.D.   On: 02/04/2023 07:31   Assessment:  82 y.o. yo Female with a problem list including...  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) 08/04/2019   Esophageal stenosis 01/28/2018   Varicose veins of left leg with edema 07/29/2017   Dysphagia, oropharyngeal 04/30/2017   Lumbar degenerative disc disease 04/30/2017   Spinal stenosis of lumbar region 12/08/2016   Idiopathic scoliosis of lumbar region 09/09/2016   Chronic midline low back pain with right-sided sciatica 07/28/2016   GERD without esophagitis 07/27/2015   Intermittent vertigo 07/27/2015   Osteoporosis, senile 07/27/2015   Seasonal allergic rhinitis due to pollen 07/27/2015   Acquired hypothyroidism 01/05/2015   Benign essential hypertension 06/29/2014   Mixed hyperlipidemia 06/29/2014   Stage 3b chronic kidney disease (HCC) 06/29/2014   Insomnia 11/11/2013   Mild reactive airways disease 11/11/2013    presents to clinic for follow-up evaluation of left breast cancer, progressing well.  Plan:              - return to clinic in 1 year with follow-up imaging, or as needed, instructed to call office if any questions or concerns  -  Continue her Arimidex/Prolia      Campbell Lerner, MD, FACS [place]: [facility] - Partnering for exceptional care. Office: [phone]

## 2023-02-17 ENCOUNTER — Other Ambulatory Visit: Payer: Self-pay | Admitting: Nephrology

## 2023-02-17 DIAGNOSIS — N1339 Other hydronephrosis: Secondary | ICD-10-CM

## 2023-02-17 DIAGNOSIS — N1832 Chronic kidney disease, stage 3b: Secondary | ICD-10-CM

## 2023-02-19 ENCOUNTER — Other Ambulatory Visit: Payer: Self-pay

## 2023-02-19 DIAGNOSIS — N1330 Unspecified hydronephrosis: Secondary | ICD-10-CM

## 2023-02-20 ENCOUNTER — Ambulatory Visit: Payer: Medicare HMO | Admitting: Urology

## 2023-02-20 ENCOUNTER — Other Ambulatory Visit
Admission: RE | Admit: 2023-02-20 | Discharge: 2023-02-20 | Disposition: A | Discharge status: Home / Self Care | Payer: Medicare HMO | Attending: Urology | Admitting: Urology

## 2023-02-20 VITALS — BP 157/80 | HR 76 | Ht 63.0 in | Wt 171.5 lb

## 2023-02-20 DIAGNOSIS — B379 Candidiasis, unspecified: Secondary | ICD-10-CM

## 2023-02-20 DIAGNOSIS — N1330 Unspecified hydronephrosis: Secondary | ICD-10-CM | POA: Diagnosis not present

## 2023-02-20 LAB — URINALYSIS, COMPLETE (UACMP) WITH MICROSCOPIC
Bilirubin Urine: NEGATIVE
Glucose, UA: NEGATIVE mg/dL
Hgb urine dipstick: NEGATIVE
Ketones, ur: NEGATIVE mg/dL
Nitrite: NEGATIVE
Protein, ur: NEGATIVE mg/dL
RBC / HPF: NONE SEEN RBC/hpf (ref 0–5)
Specific Gravity, Urine: 1.02 (ref 1.005–1.030)
pH: 5.5 (ref 5.0–8.0)

## 2023-02-20 LAB — BLADDER SCAN AMB NON-IMAGING: Scan Result: 38

## 2023-02-20 NOTE — Progress Notes (Signed)
Marcelle Overlie Plume,acting as a scribe for Vanna Scotland, MD.,have documented all relevant documentation on the behalf of Vanna Scotland, MD,as directed by  Vanna Scotland, MD while in the presence of Vanna Scotland, MD.  02/20/23 4:37 PM   Latoya Stevenson 04-09-1940 [id number]  Referring provider: Mady Haagensen, MD [address] [place],  [place] [address]  Chief Complaint  Patient presents with   Establish Care   Hydronephrosis    HPI: 82 year-old female who presents today for further evaluation of mild bilateral hydronephrosis.   She was seen and evaluated by Dr. Cherylann Ratel for further evaluation for her CKD. She had a renal ultrasound on 01/22/2023 that showed mild bilateral hydronephrosis. She is scheduled for a CT abdomen pelvis without contrast next week. Notably, her bladder was somewhat full on the study there are not any post void renal imaging. She does not have any other remote imaging to review for comparison.   Her renal function has been stage 3 for many years.   Her baseline creatinine is 1.64. Her PVR today is 38.   She reports a history of frequent urinary tract infections in her 30s and 40s, which led to bladder stretching procedures. She denies any current urinary retention or incontinence. She reports a little bit of discharge in her pants, possibly due to a yeast infection. She has been on a strong antibiotic since last Tuesday for a toe infection. She denies itching or significant discomfort from the discharge.  Results for orders placed or performed in visit on 02/20/23  Bladder Scan (Post Void Residual) in office  Result Value Ref Range   Scan Result 38 ml   Results for orders placed or performed during the hospital encounter of 02/20/23  Urinalysis, Complete w Microscopic -  Result Value Ref Range   Color, Urine YELLOW YELLOW   APPearance CLEAR CLEAR   Specific Gravity, Urine 1.020 1.005 - 1.030   pH 5.5 5.0 - 8.0   Glucose, UA NEGATIVE NEGATIVE  mg/dL   Hgb urine dipstick NEGATIVE NEGATIVE   Bilirubin Urine NEGATIVE NEGATIVE   Ketones, ur NEGATIVE NEGATIVE mg/dL   Protein, ur NEGATIVE NEGATIVE mg/dL   Nitrite NEGATIVE NEGATIVE   Leukocytes,Ua TRACE (A) NEGATIVE   Squamous Epithelial / HPF 0-5 0 - 5 /HPF   WBC, UA 0-5 0 - 5 WBC/hpf   RBC / HPF NONE SEEN 0 - 5 RBC/hpf   Bacteria, UA FEW (A) NONE SEEN     PMH: Past Medical History:  Diagnosis Date   Allergic rhinitis    Anemia    low iron   Anxiety    Arthritis 07/2019   osteoporosis   Asthma without status asthmaticus    seasonal, not often   Bilateral sciatica    Breast cancer (HCC) 07/2019   invasive mammary cancer , grade 2   Bronchitis    Cancer (HCC)     follicular variant papillary thyroid cancer   Chronic back pain    unspecified   Chronic kidney disease    ckd stage 3b   Colon polyp    adenomatous   DDD (degenerative disc disease), lumbar    DJD (degenerative joint disease)    Dysphagia    Dyspnea    E coli bacteremia    around 10/11   Environmental allergies    Family history of breast cancer    Family history of colon cancer    Family history of ovarian cancer    Family  history of pancreatic cancer    Family history of prostate cancer    Family history of thyroid cancer    GERD (gastroesophageal reflux disease)    Headache    migraines as a young woman   History of recurrent UTIs    Hyperlipidemia    Hypertension    Hypothyroidism    IBS (irritable bowel syndrome)    Lumbar compression fracture (HCC) 12/02/2016   Lumbar disc disease    Mold exposure 2002   hx of    Osteoporosis, post-menopausal    Personal history of radiation therapy    Pneumonia 1978   Restless legs    Scoliosis    Spinal stenosis    Thyroid nodule    aspirated in 1997 and 2003 which were negative   Ulcer    Varicose veins of left lower extremity    Vertigo    Wears dentures     Surgical History: Past Surgical History:  Procedure Laterality Date    ABDOMINAL HYSTERECTOMY  1977   Dr. Lance Coon FRACTURE SURGERY Left 2003   arch of foot.  no metal. per patient, she did not have surgery for this fracture. healed on its own   ANTERIOR LAT LUMBAR FUSION Left 09/09/2016   Procedure: Left Lumbar two-three Anterolateral lumbar interbody fusion with lateral plate;  Surgeon: Maeola Harman, MD;  Location: [facility] OR;  Service: Neurosurgery;  Laterality: Left;  Left L2-3 Anterolateral lumbar interbody fusion with lateral plate   APPENDECTOMY     BACK SURGERY  2013, 2016, 2018   lumbar lam, fusion, discectomy.titanium   BIOPSY THYROID     x2  thyroid nodule ; Dr. Okey Dupre and one by Dr. Hyacinth Meeker   BREAST BIOPSY Left 08/02/2019   coil clip-INVASIVE MAMMARY CARCINOMA   BREAST CYST ASPIRATION  1970's   BREAST LUMPECTOMY Left 2021   BREAST LUMPECTOMY WITH RADIOFREQUENCY TAG IDENTIFICATION Left 08/12/2019   Procedure: BREAST LUMPECTOMY WITH RADIOFREQUENCY TAG IDENTIFICATION;  Surgeon: Campbell Lerner, MD;  Location: [facility] ORS;  Service: General;  Laterality: Left;   BREAST LUMPECTOMY WITH SENTINEL LYMPH NODE BIOPSY Left 08/12/2019   Procedure: BREAST LUMPECTOMY WITH SENTINEL LYMPH NODE BX;  Surgeon: Campbell Lerner, MD;  Location: [facility] ORS;  Service: General;  Laterality: Left;   broken arm Left 1994   wrist fracture surgery, left.  metal has been replaced.   CARDIOVASCULAR STRESS TEST     2010   COLONOSCOPY  2010   COLONOSCOPY  07/21/2013   PHX OF CP/REPEAT 36YRS/OH   ESOPHAGOGASTRODUODENOSCOPY (EGD) WITH PROPOFOL N/A 09/22/2018   Procedure: ESOPHAGOGASTRODUODENOSCOPY (EGD) WITH PROPOFOL;  Surgeon: Toledo, Boykin Nearing, MD;  Location: [facility] END[facility];  Service: Gastroenterology;  Laterality: N/A;  Patient to have Rapid COVID-19 day of procedure Boyd Kerbs approved this)   EYE SURGERY Bilateral    cataract surgery with lens implant   FOREIGN BODY REMOVAL N/A 12/09/2019   Procedure: FOREIGN BODY REMOVAL ADULT;  Surgeon: Kennedy Bucker, MD;  Location: [facility] ORS;   Service: Orthopedics;  Laterality: N/A;   INCISION AND DRAINAGE Left 12/09/2019   Procedure: INCISION AND DRAINAGE;  Surgeon: Kennedy Bucker, MD;  Location: [facility] ORS;  Service: Orthopedics;  Laterality: Left;   LACERATION REPAIR Bilateral 12/09/2019   Procedure: REPAIR MULTIPLE LACERATIONS;  Surgeon: Kennedy Bucker, MD;  Location: [facility] ORS;  Service: Orthopedics;  Laterality: Bilateral;   LUMBAR LAMINECTOMY/DECOMPRESSION MICRODISCECTOMY  11/18/2011   Procedure: LUMBAR LAMINECTOMY/DECOMPRESSION MICRODISCECTOMY 2 LEVELS;  Surgeon: Maeola Harman, MD;  Location: [facility];  Service: Neurosurgery;  Laterality: N/A;  Lumbar Three-Four,  Lumbar Four-Five Decompressive Laminectomy   MALONEY DILATION N/A 09/22/2018   Procedure: MALONEY DILATION;  Surgeon: Toledo, Boykin Nearing, MD;  Location: [facility];  Service: Gastroenterology;  Laterality: N/A;   OVARIAN CYST SURGERY     POSTERIOR FUSION LUMBAR SPINE  2016   TOTAL THYROIDECTOMY     TRANSTHORACIC ECHOCARDIOGRAM  2010   upper and lower GI  2010    Home Medications:  Allergies as of 02/20/2023       Reactions   Flagyl [metronidazole Hcl] Swelling   Swelling in face, mouth, throat, palms of feet and hands.   Furosemide Itching   Made side of mouth irritated and puffy.  The rim inside her lip swelled up   Macrodantin [nitrofurantoin Macrocrystal] Anaphylaxis, Hives, Swelling   Penicillins Anaphylaxis, Swelling   PATIENT HAD A PCN REACTION WITH IMMEDIATE RASH, FACIAL/TONGUE/THROAT SWELLING, SOB, OR LIGHTHEADEDNESS WITH HYPOTENSION:  #  #  #  YES  #  #  #   Has patient had a PCN reaction causing severe rash involving mucus membranes or skin necrosis: No Has patient had a PCN reaction that required hospitalization: No Has patient had a PCN reaction occurring within the last 10 years: No If all of the above answers are "NO", then may proceed with Cephalosporin use.   Celebrex [celecoxib] Swelling   Swelling in joints.  Did not agree with her. Did not  help her either   Other    Pt says it was recently discovered she allergic to an ingredient in local anesthesia but she can't recall the name. (epinephrine and the additive used for the biopsy. Gets very nervous. Feels internally shaking. 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 (2018 and 2021)        Medication List        Accurate as of February 20, 2023  4:37 PM. If you have any questions, ask your nurse or doctor.          acetaminophen 650 MG CR tablet Commonly known as: TYLENOL Take 1,300 mg by mouth every 8 (eight) hours as needed for pain.   anastrozole 1 MG tablet Commonly known as: ARIMIDEX TAKE 1 TABLET EVERY DAY   atorvastatin 40 MG tablet Commonly known as: LIPITOR TAKE 1 TABLET AT BEDTIME   Calcium Carbonate-Vitamin D 600-400 MG-UNIT tablet Take 2 tablets by mouth daily.   chlorthalidone 50 MG tablet Commonly known as: HYGROTON Take 1 tablet by mouth 2 (two) times daily.   cholecalciferol 25 MCG (1000 UNIT) tablet Commonly known as: VITAMIN D3 Take 1,000 Units by mouth daily.   denosumab 60 MG/ML Sosy injection Commonly known as: PROLIA Inject 60 mg into the skin every 6 (six) months.   doxycycline 100 MG tablet Commonly known as: VIBRA-TABS Take 100 mg by mouth 2 (two) times daily.   fexofenadine 180 MG tablet Commonly known as: ALLEGRA Take 180 mg by mouth daily.   fluticasone 50 MCG/ACT nasal spray Commonly known as: FLONASE USE 2 SPRAYS INTO BOTH NOSTRILS DAILY AS NEEDED FOR ALLERGIES.   gabapentin 300 MG capsule Commonly known as: NEURONTIN Take 300 mg by mouth 2 (two) times daily.   GLUCOSAMINE-CHONDROITIN PO Take 1 tablet by mouth daily.   irbesartan 300 MG tablet Commonly known as: AVAPRO TAKE 1 TABLET EVERY DAY   levothyroxine 150 MCG tablet Commonly known as: SYNTHROID Take by mouth.   multivitamin with minerals Tabs tablet Take 1 tablet by mouth daily.  omeprazole 40 MG capsule Commonly known as:  PRILOSEC TAKE 1 CAPSULE EVERY DAY BEFORE BREAKFAST   sertraline 50 MG tablet Commonly known as: ZOLOFT TAKE 1 TABLET EVERY DAY   vitamin C 1000 MG tablet Take 1,000 mg by mouth daily.   vitamin E 180 MG (400 UNITS) capsule Take 400 Units by mouth daily.   Voltaren 1 % Gel Generic drug: diclofenac Sodium Apply 2 g topically 4 (four) times daily.        Allergies:  Allergies  Allergen Reactions   Flagyl [Metronidazole Hcl] Swelling    Swelling in face, mouth, throat, palms of feet and hands.   Furosemide Itching    Made side of mouth irritated and puffy.  The rim inside her lip swelled up   Macrodantin [Nitrofurantoin Macrocrystal] Anaphylaxis, Hives and Swelling   Penicillins Anaphylaxis and Swelling    PATIENT HAD A PCN REACTION WITH IMMEDIATE RASH, FACIAL/TONGUE/THROAT SWELLING, SOB, OR LIGHTHEADEDNESS WITH HYPOTENSION:  #  #  #  YES  #  #  #   Has patient had a PCN reaction causing severe rash involving mucus membranes or skin necrosis: No Has patient had a PCN reaction that required hospitalization: No Has patient had a PCN reaction occurring within the last 10 years: No If all of the above answers are "NO", then may proceed with Cephalosporin use.    Celebrex [Celecoxib] Swelling    Swelling in joints.  Did not agree with her. Did not help her either   Other     Pt says it was recently discovered she allergic to an ingredient in local anesthesia but she can't recall the name. (epinephrine and the additive used for the biopsy. Gets very nervous. Feels internally shaking. 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 She has never been exposed to tobacco smoke. She has never used smokeless tobacco. She reports that she does not currently use alcohol. She reports that she does not use drugs.   Physical Exam: BP (!) 157/80   Pulse 76   Ht 5\' 3"  (1.6 m)   Wt 171 lb 8 oz (77.8 kg)   BMI 30.38 kg/m   Constitutional:  Alert and oriented, No acute distress. HEENT: [place] AT, moist mucus membranes.  Trachea midline, no masses. Neurologic: Grossly intact, no focal deficits, moving all 4 extremities. Psychiatric: Normal mood and affect.   Pertinent Imaging: EXAM: RENAL / URINARY TRACT ULTRASOUND COMPLETE  COMPARISON:  None Available.  FINDINGS: Right Kidney:  Renal measurements: 10 x 4.8 x 4.6 cm = volume: 116 mL. Echogenicity within normal limits. No mass visualized. Mild hydronephrosis.  Left Kidney:  Renal measurements: 11.1 x 3.7 x 4.4 cm = volume: 94.8 mL. Echogenicity within normal  limits. No mass visualized. Mild hydronephrosis.  Bladder:  Appears normal for degree of bladder distention.  Other:  None.  IMPRESSION: Mild bilateral hydronephrosis.   Electronically Signed By: Sherian Rein M.D. On: 01/22/2023 15:04  This was personally reviewed and I agree with the radiologic interpretation.    Assessment & Plan:    1.  Mild bilateral hydronephrosis - Ultrasound findings may be due to a full bladder during the study. - Await results of the CT abdomen/pelvis scheduled for next week.   - Advised to have an empty bladder before the CT scan to ensure accurate results.  - Follow-up will be conducted via phone or MyChart to discuss the CT findings. If necessary, a follow-up appointment will be scheduled.  2. Possible yeast infection - Likely secondary to  recent antibiotic use for a toe infection. - Prescribe a single-dose oral antifungal medication to be taken after completing the antibiotic course.  - Monitor symptoms and advised her to report if symptoms persist or worsen.  Return for review of CT results once they become available.  I have reviewed the above documentation for accuracy and completeness, and I agree with the above.   Vanna Scotland, MD    [facility] [address] [place], [place] [address] [phone]

## 2023-02-23 MED ORDER — FLUCONAZOLE 150 MG PO TABS: 150.0000 mg | ORAL_TABLET | Freq: Once | ORAL | 0 refills | Status: AC

## 2023-02-24 ENCOUNTER — Ambulatory Visit
Admission: RE | Admit: 2023-02-24 | Discharge: 2023-02-24 | Disposition: A | Discharge status: Home / Self Care | Payer: Medicare HMO | Source: Ambulatory Visit | Attending: Nephrology | Admitting: Nephrology

## 2023-02-24 DIAGNOSIS — N1339 Other hydronephrosis: Secondary | ICD-10-CM | POA: Diagnosis not present

## 2023-02-24 DIAGNOSIS — K8020 Calculus of gallbladder without cholecystitis without obstruction: Secondary | ICD-10-CM | POA: Diagnosis not present

## 2023-02-24 DIAGNOSIS — N1832 Chronic kidney disease, stage 3b: Secondary | ICD-10-CM | POA: Diagnosis not present

## 2023-02-24 DIAGNOSIS — K5730 Diverticulosis of large intestine without perforation or abscess without bleeding: Secondary | ICD-10-CM | POA: Diagnosis not present

## 2023-02-24 DIAGNOSIS — R109 Unspecified abdominal pain: Secondary | ICD-10-CM | POA: Diagnosis not present

## 2023-02-24 DIAGNOSIS — N200 Calculus of kidney: Secondary | ICD-10-CM | POA: Diagnosis not present

## 2023-03-03 DIAGNOSIS — M2012 Hallux valgus (acquired), left foot: Secondary | ICD-10-CM | POA: Diagnosis not present

## 2023-03-03 DIAGNOSIS — M2011 Hallux valgus (acquired), right foot: Secondary | ICD-10-CM | POA: Diagnosis not present

## 2023-03-03 DIAGNOSIS — L97512 Non-pressure chronic ulcer of other part of right foot with fat layer exposed: Secondary | ICD-10-CM | POA: Diagnosis not present

## 2023-03-03 DIAGNOSIS — M2041 Other hammer toe(s) (acquired), right foot: Secondary | ICD-10-CM | POA: Diagnosis not present

## 2023-03-03 DIAGNOSIS — M2042 Other hammer toe(s) (acquired), left foot: Secondary | ICD-10-CM | POA: Diagnosis not present

## 2023-03-09 ENCOUNTER — Telehealth: Payer: Self-pay | Admitting: Urology

## 2023-03-09 ENCOUNTER — Other Ambulatory Visit: Payer: Self-pay | Admitting: Oncology

## 2023-03-09 NOTE — Telephone Encounter (Signed)
Final read on CT scan shows NO issues with the kidneys.  This is great news.  No further follow up needed.  Vanna Scotland, MD

## 2023-03-10 ENCOUNTER — Telehealth: Payer: Self-pay | Admitting: *Deleted

## 2023-03-10 ENCOUNTER — Other Ambulatory Visit: Payer: Self-pay | Admitting: *Deleted

## 2023-03-10 NOTE — Telephone Encounter (Signed)
Recevied incoming fax from patient's mail order pharmacy to clarify directions for RF for patient's arimidex. Per note on script. Patient must be seen in next 30 days, this med will not be refilled again until patient sees doctor.  I personally reached out to patient to let her know that she missed her last apt with Dr. Smith Robert in Nov. She stated that she thought her apt was in January. I sent a msg to scheduling a msg to r/s her no show apt.  Pt stated that she has enough Arimidex for now to wait on RF. She should have enough to last until February.

## 2023-03-12 NOTE — Telephone Encounter (Signed)
Pt aware.

## 2023-03-24 ENCOUNTER — Other Ambulatory Visit: Payer: Self-pay | Admitting: Internal Medicine

## 2023-03-25 DIAGNOSIS — M51362 Other intervertebral disc degeneration, lumbar region with discogenic back pain and lower extremity pain: Secondary | ICD-10-CM | POA: Diagnosis not present

## 2023-03-25 DIAGNOSIS — M150 Primary generalized (osteo)arthritis: Secondary | ICD-10-CM | POA: Diagnosis not present

## 2023-03-25 NOTE — Telephone Encounter (Signed)
 Requested Prescriptions  Pending Prescriptions Disp Refills   fluticasone  (FLONASE ) 50 MCG/ACT nasal spray [Pharmacy Med Name: Fluticasone  Propionate Nasal Suspension 50 MCG/ACT] 16 g 3    Sig: USE 2 SPRAYS INTO BOTH NOSTRILS DAILY AS NEEDED FOR ALLERGIES.     Ear, Nose, and Throat: Nasal Preparations - Corticosteroids Passed - 03/25/2023  2:16 PM      Passed - Valid encounter within last 12 months    Recent Outpatient Visits           3 months ago Bronchitis due to 2019-nCoV   [facility] at [facility], Leita DEL, MD   5 months ago Annual physical exam   [facility] at [facility] [facility], Leita DEL, MD   7 months ago Acquired hypothyroidism   [facility] at [facility], Leita DEL, MD   11 months ago Benign essential hypertension   [facility] at [facility], Leita DEL, MD   12 months ago Varicose veins of left leg with edema   [facility] at [facility], Leita DEL, MD       Future Appointments             In 4 weeks Justus Leita DEL, MD [facility] Medicine at [facility], [facility]   In 6 months Justus, Leita DEL, MD [facility] Medicine at [facility], [facility]

## 2023-04-09 DIAGNOSIS — M2041 Other hammer toe(s) (acquired), right foot: Secondary | ICD-10-CM | POA: Diagnosis not present

## 2023-04-09 DIAGNOSIS — M2042 Other hammer toe(s) (acquired), left foot: Secondary | ICD-10-CM | POA: Diagnosis not present

## 2023-04-09 DIAGNOSIS — L97511 Non-pressure chronic ulcer of other part of right foot limited to breakdown of skin: Secondary | ICD-10-CM | POA: Diagnosis not present

## 2023-04-09 DIAGNOSIS — M2012 Hallux valgus (acquired), left foot: Secondary | ICD-10-CM | POA: Diagnosis not present

## 2023-04-09 DIAGNOSIS — M2011 Hallux valgus (acquired), right foot: Secondary | ICD-10-CM | POA: Diagnosis not present

## 2023-04-14 ENCOUNTER — Telehealth: Payer: Self-pay | Admitting: *Deleted

## 2023-04-20 ENCOUNTER — Other Ambulatory Visit: Payer: Self-pay | Admitting: *Deleted

## 2023-04-20 DIAGNOSIS — Z08 Encounter for follow-up examination after completed treatment for malignant neoplasm: Secondary | ICD-10-CM

## 2023-04-21 ENCOUNTER — Inpatient Hospital Stay: Payer: Medicare HMO | Attending: Oncology | Admitting: Oncology

## 2023-04-21 ENCOUNTER — Encounter: Payer: Self-pay | Admitting: Oncology

## 2023-04-21 VITALS — BP 133/75 | HR 81 | Temp 96.9°F | Resp 18 | Wt 177.0 lb

## 2023-04-21 DIAGNOSIS — M8580 Other specified disorders of bone density and structure, unspecified site: Secondary | ICD-10-CM | POA: Insufficient documentation

## 2023-04-21 DIAGNOSIS — Z08 Encounter for follow-up examination after completed treatment for malignant neoplasm: Secondary | ICD-10-CM | POA: Diagnosis not present

## 2023-04-21 DIAGNOSIS — Z170 Estrogen receptor positive status [ER+]: Secondary | ICD-10-CM | POA: Insufficient documentation

## 2023-04-21 DIAGNOSIS — Z79899 Other long term (current) drug therapy: Secondary | ICD-10-CM | POA: Diagnosis not present

## 2023-04-21 DIAGNOSIS — C50912 Malignant neoplasm of unspecified site of left female breast: Secondary | ICD-10-CM | POA: Insufficient documentation

## 2023-04-21 DIAGNOSIS — Z5181 Encounter for therapeutic drug level monitoring: Secondary | ICD-10-CM

## 2023-04-21 DIAGNOSIS — R5383 Other fatigue: Secondary | ICD-10-CM

## 2023-04-21 DIAGNOSIS — Z79811 Long term (current) use of aromatase inhibitors: Secondary | ICD-10-CM | POA: Diagnosis not present

## 2023-04-21 DIAGNOSIS — Z853 Personal history of malignant neoplasm of breast: Secondary | ICD-10-CM | POA: Diagnosis not present

## 2023-04-21 DIAGNOSIS — M85839 Other specified disorders of bone density and structure, unspecified forearm: Secondary | ICD-10-CM

## 2023-04-21 NOTE — Progress Notes (Signed)
 Hematology/Oncology Consult note [facility]  Telephone:([phone] [phone]  Patient Care Team: Justus Leita DEL, MD as PCP - General (Internal Medicine) Melanee Annah BROCKS, MD as Consulting Physician (Oncology) Lenn Aran, MD as Referring Physician (Radiation Oncology) Lane Shope, MD as Consulting Physician (General Surgery) Cindie Jesusa HERO, RN as Registered Nurse Tobie Lady POUR, MD as Consulting Physician (Rheumatology) Damian Therisa HERO, MD as Physician Assistant (Endocrinology) Bosie Vinie LABOR, MD as Consulting Physician (Cardiology) Aundria Ladell POUR, MD as Consulting Physician (Gastroenterology) Neill Boas, DPM (Podiatry) Dingeldein, Elspeth, MD (Ophthalmology)   Name of the patient: Latoya Stevenson  [id number]  Mar 10, 1941   Date of visit: 04/21/23  Diagnosis-  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  She then noticed possible nipple discharge from her right breast which prompted a diagnostic right breast mammogram which was essentially unremarkable.  MRI was recommended and patient underwent a bilateral MRI which did not show any concerning findings in the right breast where she had the nipple discharge but showed an enhancing mass in the inner left breast measuring 0.9 x 0.6 x 0.7 cm which was suspicious and biopsy. Biopsy was consistent with invasive mammary carcinoma grade 2 6 mm ER greater than 90% positive PR greater than 90% positive and HER-2 negative.   Genetic testing negative   Final pathology showed 7 mm grade 2 invasive mammary carcinoma with positive unifocal anterior margin.  1 sentinel lymph node  negative for malignancy.  PT1BPN0 tumor was greater than 90% ER positive PR positive and HER-2 negative   Patient completed adjuvant radiation therapy and started Arimidex  in October 2021.  She has baseline osteoporosis and follows up with endocrinology.  She gets Prolia   Interval history-bowel movements have been regular sometimes fluctuating between constipation and diarrhea.  She is also concerned about her second toe of the left foot not healing because of constant friction from the big toe.  She is going to be seeing wound clinic at [facility] soon.  She is currently tolerating Arimidex  otherwise well without any significant side effects.  ECOG PS- 1 Pain scale- 0   Review of systems- Review of Systems  Constitutional:  Negative for chills, fever, malaise/fatigue and weight loss.  HENT:  Negative for congestion, ear discharge and nosebleeds.   Eyes:  Negative for blurred vision.  Respiratory:  Negative for cough, hemoptysis, sputum production, shortness of breath and wheezing.   Cardiovascular:  Negative for chest pain, palpitations, orthopnea and claudication.  Gastrointestinal:  Positive for constipation. Negative for abdominal pain, blood in stool, diarrhea, heartburn, melena, nausea and vomiting.  Genitourinary:  Negative for dysuria, flank pain, frequency, hematuria and urgency.  Musculoskeletal:  Negative for back pain, joint pain and myalgias.  Skin:  Negative for rash.  Neurological:  Negative for dizziness, tingling, focal weakness, seizures, weakness and headaches.  Endo/Heme/Allergies:  Does not bruise/bleed easily.  Psychiatric/Behavioral:  Negative for depression and suicidal ideas. The patient does not have insomnia.       Allergies  Allergen Reactions   Flagyl [Metronidazole Hcl] Swelling    Swelling in face, mouth, throat, palms of feet and hands.   Furosemide Itching    Made side of mouth irritated and puffy.  The rim inside her lip swelled up   Macrodantin  [Nitrofurantoin Macrocrystal] Anaphylaxis, Hives and Swelling   Penicillins Anaphylaxis and Swelling    PATIENT HAD A PCN REACTION WITH IMMEDIATE RASH, FACIAL/TONGUE/THROAT SWELLING, SOB, OR LIGHTHEADEDNESS WITH HYPOTENSION:  #  #  #  YES  #  #  #   Has patient had a PCN reaction causing severe rash involving mucus membranes or skin necrosis: No Has patient had a PCN reaction that required hospitalization: No Has patient had a PCN reaction occurring within the last 10 years: No If all of the above answers are NO, then may proceed with Cephalosporin use.    Celebrex [Celecoxib] Swelling    Swelling in joints.  Did not agree with her. Did not help her either   Other     Pt says it was recently discovered she allergic to an ingredient in local anesthesia but she can't recall the name. (epinephrine  and the additive used for the biopsy. Gets very nervous. Feels internally shaking. 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 (2018 and 2021)     Past Medical History:  Diagnosis Date   Allergic rhinitis    Anemia    low iron   Anxiety    Arthritis 07/2019   osteoporosis   Asthma without status asthmaticus    seasonal, not often   Bilateral sciatica    Breast cancer (HCC) 07/2019   invasive mammary cancer , grade 2   Bronchitis    Cancer (HCC)     follicular variant papillary thyroid  cancer   Chronic back pain    unspecified   Chronic kidney disease    ckd stage 3b   Colon polyp    adenomatous   DDD (degenerative disc disease), lumbar    DJD (degenerative joint disease)    Dysphagia    Dyspnea    E coli bacteremia    around 10/11   Environmental allergies    Family history of breast cancer    Family history of colon cancer    Family history of ovarian cancer    Family history of pancreatic cancer    Family history of prostate cancer    Family history of thyroid  cancer    GERD (gastroesophageal reflux disease)    Headache    migraines as a young woman    History of recurrent UTIs    Hyperlipidemia    Hypertension    Hypothyroidism    IBS (irritable bowel syndrome)    Lumbar compression fracture (HCC) 12/02/2016   Lumbar disc disease    Mold exposure 2002   hx of    Osteoporosis, post-menopausal    Personal history of radiation therapy    Pneumonia 1978   Restless legs    Scoliosis    Spinal stenosis    Thyroid  nodule    aspirated in 1997 and 2003 which were negative   Ulcer    Varicose veins of left lower extremity    Vertigo    Wears dentures      Past Surgical History:  Procedure Laterality Date   ABDOMINAL HYSTERECTOMY  1977   Dr. Darra MO FRACTURE SURGERY Left 2003   arch of foot.  no metal. per patient, she did not have surgery for this fracture. healed on its own   ANTERIOR LAT LUMBAR FUSION Left 09/09/2016   Procedure: Left Lumbar two-three Anterolateral lumbar interbody fusion with lateral plate;  Surgeon: Unice Pac, MD;  Location: [facility] OR;  Service: Neurosurgery;  Laterality: Left;  Left L2-3 Anterolateral lumbar interbody fusion with lateral plate   APPENDECTOMY     BACK SURGERY  2013, 2016, 2018   lumbar lam, fusion, discectomy.titanium   BIOPSY THYROID      x2  thyroid  nodule ; Dr. Rollene and one by Dr. Cleotilde   BREAST BIOPSY Left 08/02/2019   coil clip-INVASIVE MAMMARY CARCINOMA   BREAST CYST ASPIRATION  1970's   BREAST LUMPECTOMY Left 2021   BREAST LUMPECTOMY WITH RADIOFREQUENCY TAG IDENTIFICATION Left 08/12/2019   Procedure: BREAST LUMPECTOMY WITH RADIOFREQUENCY TAG IDENTIFICATION;  Surgeon: Lane Shope, MD;  Location: [facility] ORS;  Service: General;  Laterality: Left;   BREAST LUMPECTOMY WITH SENTINEL LYMPH NODE BIOPSY Left 08/12/2019   Procedure: BREAST LUMPECTOMY WITH SENTINEL LYMPH NODE BX;  Surgeon: Lane Shope, MD;  Location: [facility] ORS;  Service: General;  Laterality: Left;   broken arm Left 1994   wrist fracture surgery, left.  metal has been replaced.   CARDIOVASCULAR STRESS TEST      2010   COLONOSCOPY  2010   COLONOSCOPY  07/21/2013   PHX OF CP/REPEAT 53YRS/[place]   ESOPHAGOGASTRODUODENOSCOPY (EGD) WITH PROPOFOL  N/A 09/22/2018   Procedure: ESOPHAGOGASTRODUODENOSCOPY (EGD) WITH PROPOFOL ;  Surgeon: Toledo, Ladell POUR, MD;  Location: [facility] ENDOSCOPY;  Service: Gastroenterology;  Laterality: N/A;  Patient to have Rapid COVID-19 day of procedure Nan approved this)   EYE SURGERY Bilateral    cataract surgery with lens implant   FOREIGN BODY REMOVAL N/A 12/09/2019   Procedure: FOREIGN BODY REMOVAL ADULT;  Surgeon: Kathlynn Sharper, MD;  Location: [facility] ORS;  Service: Orthopedics;  Laterality: N/A;   INCISION AND DRAINAGE Left 12/09/2019   Procedure: INCISION AND DRAINAGE;  Surgeon: Kathlynn Sharper, MD;  Location: [facility] ORS;  Service: Orthopedics;  Laterality: Left;   LACERATION REPAIR Bilateral 12/09/2019   Procedure: REPAIR MULTIPLE LACERATIONS;  Surgeon: Kathlynn Sharper, MD;  Location: [facility] ORS;  Service: Orthopedics;  Laterality: Bilateral;   LUMBAR LAMINECTOMY/DECOMPRESSION MICRODISCECTOMY  11/18/2011   Procedure: LUMBAR LAMINECTOMY/DECOMPRESSION MICRODISCECTOMY 2 LEVELS;  Surgeon: Fairy Levels, MD;  Location: [facility];  Service: Neurosurgery;  Laterality: N/A;  Lumbar Three-Four,  Lumbar Four-Five Decompressive Laminectomy   MALONEY DILATION N/A 09/22/2018   Procedure: MALONEY DILATION;  Surgeon: Toledo, Ladell POUR, MD;  Location: [facility] END[facility];  Service: Gastroenterology;  Laterality: N/A;   OVARIAN CYST SURGERY     POSTERIOR FUSION LUMBAR SPINE  2016   TOTAL THYROIDECTOMY     TRANSTHORACIC ECHOCARDIOGRAM  2010   upper and lower GI  2010    Social History   Socioeconomic History   Marital status: Widowed    Spouse name: Not on file   Number of children: 2   Years of education: Not on file   Highest education level: Not on file  Occupational History   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   Social Drivers of Corporate Investment Banker Strain: Low Risk  (01/07/2023)   Overall Physicist, Medical  Strain (CARDIA)    Difficulty of Paying Living Expenses: Not hard at all  Food Insecurity: No Food Insecurity (01/07/2023)   Hunger Vital Sign    Worried About Running Out of Food in the Last Year: Never true    Ran Out of Food in the Last Year: Never true  Transportation Needs: No Transportation Needs (01/07/2023)   PRAPARE - Administrator, Civil Service (Medical): No    Lack of Transportation (Non-Medical): No  Physical Activity: Insufficiently Active (01/07/2023)   Exercise Vital Sign    Days of Exercise per Week: 3 days    Minutes of Exercise per Session: 30 min  Stress: No Stress Concern Present (01/07/2023)   [employer] of Occupational Health - Occupational Stress Questionnaire    Feeling of Stress : Not at all  Social Connections: Socially Isolated (01/07/2023)   Social Connection and Isolation Panel [NHANES]    Frequency of Communication with Friends and Family: More than three times a week    Frequency of Social Gatherings with Friends and Family: Three times a week    Attends Religious Services: Never    Active Member of Clubs or Organizations: No    Attends Banker Meetings: Never    Marital Status: Widowed  Intimate Partner Violence: Not At Risk (01/07/2023)   Humiliation, Afraid, Rape, and Kick questionnaire    Fear of Current or Ex-Partner: No    Emotionally Abused: No    Physically Abused: No    Sexually Abused: No    Family History  Problem Relation Age of Onset    Breast cancer Mother 57   Thyroid  cancer Mother    Pancreatic cancer Brother    Pancreatic cancer Sister    Suicidality Father    Prostate cancer Brother    Colon cancer Brother    Breast cancer Sister        x 2 times   Heart attack Sister    Ovarian cancer Maternal Aunt    Breast cancer Paternal Aunt    Lung cancer Paternal Uncle    Breast cancer Other    Colon cancer Maternal Aunt    Pancreatic cancer Maternal Aunt      Current Outpatient Medications:    acetaminophen  (TYLENOL ) 650 MG CR tablet, Take 1,300 mg by mouth every 8 (eight) hours as needed for pain., Disp: , Rfl:    anastrozole  (ARIMIDEX ) 1 MG tablet, Patient must see doctor in the next 30 days,  this medicine will not be refilled again until patient seen by doctor., Disp: 30 tablet, Rfl: 0   Ascorbic Acid (VITAMIN C) 1000 MG tablet, Take 1,000 mg by mouth daily., Disp: , Rfl:    atorvastatin  (LIPITOR) 40 MG tablet, TAKE 1 TABLET AT BEDTIME, Disp: 90 tablet, Rfl: 3   Calcium  Carbonate-Vitamin D  600-400 MG-UNIT tablet, Take 2 tablets by mouth daily., Disp: , Rfl:    chlorthalidone  (HYGROTON ) 50 MG tablet, Take 1 tablet by mouth 2 (two) times daily., Disp: , Rfl:    cholecalciferol  (VITAMIN D3) 25 MCG (1000 UNIT) tablet, Take 1,000 Units by mouth daily., Disp: , Rfl:    denosumab  (PROLIA ) 60 MG/ML SOSY injection, Inject 60 mg into the skin every 6 (six) months., Disp: , Rfl:    diclofenac  Sodium (VOLTAREN ) 1 % GEL, Apply 2 g topically 4 (four) times daily., Disp: , Rfl:    fexofenadine (ALLEGRA) 180 MG tablet, Take 180 mg by mouth daily., Disp: , Rfl:    fluticasone  (FLONASE )  50 MCG/ACT nasal spray, USE 2 SPRAYS INTO BOTH NOSTRILS DAILY AS NEEDED FOR ALLERGIES., Disp: 16 g, Rfl: 3   gabapentin  (NEURONTIN ) 300 MG capsule, Take 300 mg by mouth 2 (two) times daily., Disp: , Rfl:    GLUCOSAMINE-CHONDROITIN PO, Take 1 tablet by mouth daily. , Disp: , Rfl:    irbesartan  (AVAPRO ) 300 MG tablet, TAKE 1 TABLET EVERY DAY, Disp:  90 tablet, Rfl: 3   levothyroxine  (SYNTHROID ) 150 MCG tablet, Take by mouth., Disp: , Rfl:    Multiple Vitamin (MULTIVITAMIN WITH MINERALS) TABS, Take 1 tablet by mouth daily., Disp: , Rfl:    omeprazole  (PRILOSEC) 40 MG capsule, TAKE 1 CAPSULE EVERY DAY BEFORE BREAKFAST, Disp: 90 capsule, Rfl: 3   sertraline  (ZOLOFT ) 50 MG tablet, TAKE 1 TABLET EVERY DAY, Disp: 90 tablet, Rfl: 3   vitamin E  180 MG (400 UNITS) capsule, Take 400 Units by mouth daily., Disp: , Rfl:   Physical exam:  Vitals:   04/21/23 1309  BP: 133/75  Pulse: 81  Resp: 18  Temp: (!) 96.9 F (36.1 C)  TempSrc: Tympanic  SpO2: 99%  Weight: 177 lb (80.3 kg)   Physical Exam Cardiovascular:     Rate and Rhythm: Normal rate and regular rhythm.     Heart sounds: Normal heart sounds.  Pulmonary:     Effort: Pulmonary effort is normal.     Breath sounds: Normal breath sounds.  Abdominal:     General: Bowel sounds are normal.     Palpations: Abdomen is soft.  Musculoskeletal:     Comments: There is chronic deep ulceration noted on the medial aspect of the left second toe measuring about 1 cm in size  Skin:    General: Skin is warm and dry.  Neurological:     Mental Status: She is alert and oriented to person, place, and time.    Breast exam was performed in seated and lying down position. Patient is status post left lumpectomy with a well-healed surgical scar. No evidence of any palpable masses. No evidence of axillary adenopathy. No evidence of any palpable masses or lumps in the right breast. No evidence of right axillary adenopathy      Latest Ref Rng & Units 10/09/2022    9:27 AM  CMP  Glucose 70 - 99 mg/dL 96   BUN 8 - 27 mg/dL 30   Creatinine 9.42 - 1.00 mg/dL 8.57   Sodium 865 - 855 mmol/L 140   Potassium 3.5 - 5.2 mmol/L 4.0   Chloride 96 - 106 mmol/L 106   CO2 20 - 29 mmol/L 21   Calcium  8.7 - 10.3 mg/dL 8.9   Total Protein 6.0 - 8.5 g/dL 5.9   Total Bilirubin 0.0 - 1.2 mg/dL 0.7   Alkaline Phos 44 -  121 IU/L 74   AST 0 - 40 IU/L 19   ALT 0 - 32 IU/L 18       Latest Ref Rng & Units 10/09/2022    9:27 AM  CBC  WBC 3.4 - 10.8 x10E3/uL 5.0   Hemoglobin 11.1 - 15.9 g/dL 89.1   Hematocrit 65.9 - 46.6 % 33.0   Platelets 150 - 450 x10E3/uL 168      Assessment and plan- Patient is a 83 y.o. female with history of stage I left breast cancer ER/PR positive HER2 negative.  This is a routine follow-up visit for breast cancer on Arimidex   Clinically patient  is doing well with no concerning signs and symptoms of recurrence  based on today's exam.  She will continue to get Arimidex  along with calcium  and vitamin D  ending in October 2026.  She has baseline osteoporosis for which she is on Prolia  through endocrinology.  I will see her back in 6 months with labs.  With regards to her toe ulceration involving the right foot-this is going to be difficult to heal given that there is constant friction from the big toe due to hammertoe.  She is exploring options with wound care as well as with her primary care doctor.  He does not appear to be infected on my exam today   Visit Diagnosis 1. Encounter for follow-up surveillance of breast cancer   2. Other fatigue   3. High risk medication use   4. Visit for monitoring Arimidex  therapy   5. Osteopenia of forearm, unspecified laterality      Dr. Annah Skene, MD, MPH [facility] [phone] 04/21/2023 3:00 PM

## 2023-04-22 ENCOUNTER — Telehealth: Payer: Self-pay | Admitting: *Deleted

## 2023-04-22 ENCOUNTER — Other Ambulatory Visit: Payer: Self-pay | Admitting: *Deleted

## 2023-04-22 ENCOUNTER — Ambulatory Visit: Payer: Medicare HMO | Admitting: Internal Medicine

## 2023-04-22 ENCOUNTER — Encounter: Payer: Self-pay | Admitting: Internal Medicine

## 2023-04-22 VITALS — BP 112/60 | HR 94 | Ht 63.0 in | Wt 176.8 lb

## 2023-04-22 DIAGNOSIS — F411 Generalized anxiety disorder: Secondary | ICD-10-CM | POA: Diagnosis not present

## 2023-04-22 DIAGNOSIS — N1832 Chronic kidney disease, stage 3b: Secondary | ICD-10-CM | POA: Diagnosis not present

## 2023-04-22 DIAGNOSIS — C50012 Malignant neoplasm of nipple and areola, left female breast: Secondary | ICD-10-CM

## 2023-04-22 DIAGNOSIS — L98491 Non-pressure chronic ulcer of skin of other sites limited to breakdown of skin: Secondary | ICD-10-CM | POA: Insufficient documentation

## 2023-04-22 DIAGNOSIS — I10 Essential (primary) hypertension: Secondary | ICD-10-CM | POA: Diagnosis not present

## 2023-04-22 DIAGNOSIS — E039 Hypothyroidism, unspecified: Secondary | ICD-10-CM

## 2023-04-22 MED ORDER — ANASTROZOLE 1 MG PO TABS: ORAL_TABLET | ORAL | 1 refills | Status: DC

## 2023-04-22 NOTE — Progress Notes (Signed)
 Date:  04/22/2023   Name:  Latoya Stevenson   DOB:  01/25/1941   MRN:  [id number]   Chief Complaint: Hypertension and Depression  Hypertension This is a chronic problem. The problem is controlled. Associated symptoms include anxiety. Pertinent negatives include no chest pain, headaches, palpitations or shortness of breath. Past treatments include diuretics and angiotensin blockers.  Anxiety Presents for follow-up visit. Patient reports no chest pain, decreased concentration, dizziness, nervous/anxious behavior, palpitations or shortness of breath.    Toe ulcer - very painful and slow to heal.  She wants to see another provider at [facility] for a second opinion.  Review of Systems  Constitutional:  Negative for fatigue and unexpected weight change.  HENT:  Negative for trouble swallowing.   Eyes:  Negative for visual disturbance.  Respiratory:  Negative for cough, chest tightness, shortness of breath and wheezing.   Cardiovascular:  Negative for chest pain, palpitations and leg swelling.  Gastrointestinal:  Negative for abdominal pain, constipation and diarrhea.  Musculoskeletal:  Positive for arthralgias.  Skin:  Positive for wound.  Neurological:  Negative for dizziness, weakness, light-headedness and headaches.  Psychiatric/Behavioral:  Positive for depression. Negative for decreased concentration and sleep disturbance. The patient is not nervous/anxious.      Lab Results  Component Value Date   NA 140 10/09/2022   K 4.0 10/09/2022   CO2 21 10/09/2022   GLUCOSE 96 10/09/2022   BUN 30 (H) 10/09/2022   CREATININE 1.42 (H) 10/09/2022   CALCIUM  8.9 10/09/2022   EGFR 37 (L) 10/09/2022   GFRNONAA 39 (L) 10/24/2021   Lab Results  Component Value Date   CHOL 122 10/09/2022   HDL 36 (L) 10/09/2022   LDLCALC 68 10/09/2022   TRIG 94 10/09/2022   CHOLHDL 3.4 10/09/2022   Lab Results  Component Value Date   TSH 75.500 (H) 07/30/2022   No results found for: HGBA1C Lab Results   Component Value Date   WBC 5.0 10/09/2022   HGB 10.8 (L) 10/09/2022   HCT 33.0 (L) 10/09/2022   MCV 88 10/09/2022   PLT 168 10/09/2022   Lab Results  Component Value Date   ALT 18 10/09/2022   AST 19 10/09/2022   ALKPHOS 74 10/09/2022   BILITOT 0.7 10/09/2022   Lab Results  Component Value Date   VD25OH 58.0 10/09/2022     Patient Active Problem List   Diagnosis Date Noted   Callous ulcer, limited to breakdown of skin (HCC) 04/22/2023   Plantar fasciitis of right foot 03/26/2022   Generalized anxiety disorder 11/04/2021   Left leg cellulitis 10/09/2021   Dyslipidemia 10/09/2021   History of breast cancer 10/09/2021   Lumbar radiculopathy 10/09/2021   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) 08/04/2019   Esophageal stenosis 01/28/2018   Varicose veins of left leg with edema 07/29/2017   Dysphagia, oropharyngeal 04/30/2017   Lumbar degenerative disc disease 04/30/2017   Spinal stenosis of lumbar region 12/08/2016   Idiopathic scoliosis of lumbar region 09/09/2016   Chronic midline low back pain with right-sided sciatica 07/28/2016   GERD without esophagitis 07/27/2015   Intermittent vertigo 07/27/2015   Osteoporosis, senile 07/27/2015   Seasonal allergic rhinitis due to pollen 07/27/2015   Acquired hypothyroidism 01/05/2015   Benign essential hypertension 06/29/2014   Mixed hyperlipidemia 06/29/2014   Stage 3b chronic kidney disease (HCC) 06/29/2014   Insomnia 11/11/2013   Mild reactive airways disease 11/11/2013    Allergies  Allergen Reactions  Flagyl [Metronidazole Hcl] Swelling    Swelling in face, mouth, throat, palms of feet and hands.   Furosemide Itching    Made side of mouth irritated and puffy.  The rim inside her lip swelled up   Macrodantin [Nitrofurantoin Macrocrystal] Anaphylaxis, Hives and Swelling   Penicillins Anaphylaxis and Swelling    PATIENT HAD A PCN REACTION WITH IMMEDIATE RASH, FACIAL/TONGUE/THROAT  SWELLING, SOB, OR LIGHTHEADEDNESS WITH HYPOTENSION:  #  #  #  YES  #  #  #   Has patient had a PCN reaction causing severe rash involving mucus membranes or skin necrosis: No Has patient had a PCN reaction that required hospitalization: No Has patient had a PCN reaction occurring within the last 10 years: No If all of the above answers are NO, then may proceed with Cephalosporin use.    Celebrex [Celecoxib] Swelling    Swelling in joints.  Did not agree with her. Did not help her either   Other     Pt says it was recently discovered she allergic to an ingredient in local anesthesia but she can't recall the name. (epinephrine  and the additive used for the biopsy. Gets very nervous. Feels internally shaking. 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 Darra MO FRACTURE SURGERY Left 2003   arch of foot.  no metal. per patient, she did not have surgery for this fracture. healed on its own   ANTERIOR LAT LUMBAR FUSION Left 09/09/2016   Procedure: Left Lumbar two-three Anterolateral lumbar interbody fusion with lateral plate;  Surgeon: Unice Pac, MD;  Location: [facility] OR;  Service: Neurosurgery;  Laterality: Left;  Left L2-3 Anterolateral lumbar interbody fusion with lateral plate   APPENDECTOMY     BACK SURGERY  2013, 2016, 2018   lumbar lam, fusion, discectomy.titanium   BIOPSY THYROID      x2  thyroid  nodule ; Dr. Rollene and one by Dr. Cleotilde   BREAST BIOPSY Left 08/02/2019   coil clip-INVASIVE MAMMARY CARCINOMA   BREAST CYST ASPIRATION  1970's   BREAST LUMPECTOMY Left 2021   BREAST LUMPECTOMY WITH RADIOFREQUENCY TAG IDENTIFICATION Left 08/12/2019   Procedure: BREAST LUMPECTOMY WITH RADIOFREQUENCY TAG IDENTIFICATION;  Surgeon: Lane Shope, MD;  Location: [facility] ORS;  Service: General;  Laterality: Left;   BREAST LUMPECTOMY WITH SENTINEL LYMPH NODE BIOPSY Left  08/12/2019   Procedure: BREAST LUMPECTOMY WITH SENTINEL LYMPH NODE BX;  Surgeon: Lane Shope, MD;  Location: [facility] ORS;  Service: General;  Laterality: Left;   broken arm Left 1994   wrist fracture surgery, left.  metal has been replaced.   CARDIOVASCULAR STRESS TEST     2010   COLONOSCOPY  2010   COLONOSCOPY  07/21/2013   PHX OF CP/REPEAT 62YRS/OH   ESOPHAGOGASTRODUODENOSCOPY (EGD) WITH PROPOFOL  N/A 09/22/2018   Procedure: ESOPHAGOGASTRODUODENOSCOPY (EGD) WITH PROPOFOL ;  Surgeon: Toledo, Ladell POUR, MD;  Location: [facility] ENDOSCOPY;  Service: Gastroenterology;  Laterality: N/A;  Patient to have Rapid COVID-19 day of procedure Nan approved this)   EYE SURGERY Bilateral    cataract surgery with lens implant   FOREIGN BODY REMOVAL N/A 12/09/2019   Procedure: FOREIGN BODY REMOVAL ADULT;  Surgeon: Kathlynn Sharper, MD;  Location: [facility] ORS;  Service: Orthopedics;  Laterality: N/A;   INCISION AND DRAINAGE Left 12/09/2019   Procedure: INCISION AND DRAINAGE;  Surgeon: Kathlynn Sharper, MD;  Location: [facility] ORS;  Service: Orthopedics;  Laterality: Left;   LACERATION REPAIR Bilateral 12/09/2019   Procedure: REPAIR MULTIPLE LACERATIONS;  Surgeon: Kathlynn Sharper, MD;  Location: [facility] ORS;  Service: Orthopedics;  Laterality: Bilateral;   LUMBAR LAMINECTOMY/DECOMPRESSION MICRODISCECTOMY  11/18/2011   Procedure: LUMBAR LAMINECTOMY/DECOMPRESSION MICRODISCECTOMY 2 LEVELS;  Surgeon: Fairy Levels, MD;  Location: [facility];  Service: Neurosurgery;  Laterality: N/A;  Lumbar Three-Four,  Lumbar Four-Five Decompressive Laminectomy   MALONEY DILATION N/A 09/22/2018   Procedure: MALONEY DILATION;  Surgeon: Toledo, Ladell POUR, MD;  Location: [facility] END[facility];  Service: Gastroenterology;  Laterality: N/A;   OVARIAN CYST SURGERY     POSTERIOR FUSION LUMBAR SPINE  2016   TOTAL THYROIDECTOMY     TRANSTHORACIC ECHOCARDIOGRAM  2010   upper and lower GI  2010    Social History   Tobacco Use   Smoking status: Never    Passive  exposure: Never   Smokeless tobacco: Never  Vaping Use   Vaping status: Never Used  Substance Use Topics   Alcohol use: Not Currently    Comment: socially-rare   Drug use: No     Medication list has been reviewed and updated.  Current Meds  Medication Sig   acetaminophen  (TYLENOL ) 650 MG CR tablet Take 1,300 mg by mouth every 8 (eight) hours as needed for pain.   anastrozole  (ARIMIDEX ) 1 MG tablet Patient must see doctor in the next 30 days,  this medicine will not be refilled again until patient seen by doctor.   Ascorbic Acid (VITAMIN C) 1000 MG tablet Take 1,000 mg by mouth daily.   atorvastatin  (LIPITOR) 40 MG tablet TAKE 1 TABLET AT BEDTIME   Calcium  Carbonate-Vitamin D  600-400 MG-UNIT tablet Take 2 tablets by mouth daily.   chlorthalidone  (HYGROTON ) 50 MG tablet Take 1 tablet by mouth 2 (two) times daily.   cholecalciferol  (VITAMIN D3) 25 MCG (1000 UNIT) tablet Take 1,000 Units by mouth daily.   denosumab  (PROLIA ) 60 MG/ML SOSY injection Inject 60 mg into the skin every 6 (six) months.   diclofenac  Sodium (VOLTAREN ) 1 % GEL Apply 2 g topically 4 (four) times daily.   fexofenadine (ALLEGRA) 180 MG tablet Take 180 mg by mouth daily.   fluticasone  (FLONASE ) 50 MCG/ACT nasal spray USE 2 SPRAYS INTO BOTH NOSTRILS DAILY AS NEEDED FOR ALLERGIES.   gabapentin  (NEURONTIN ) 300 MG capsule Take 600 mg by mouth 3 (three) times daily.   GLUCOSAMINE-CHONDROITIN PO Take 1 tablet by mouth daily.    irbesartan  (AVAPRO ) 300 MG tablet TAKE 1 TABLET EVERY DAY   levothyroxine  (SYNTHROID ) 150 MCG tablet Take by mouth.   Multiple Vitamin (MULTIVITAMIN WITH MINERALS) TABS Take 1 tablet by mouth daily.   omeprazole  (PRILOSEC) 40 MG capsule TAKE 1 CAPSULE EVERY DAY BEFORE BREAKFAST   sertraline  (ZOLOFT ) 50 MG tablet TAKE 1 TABLET EVERY DAY (Patient taking differently: Take 25 mg by mouth daily.)   vitamin E  180 MG (400 UNITS) capsule Take 400 Units by mouth daily.       04/22/2023   10:00 AM 10/09/2022     8:40 AM 07/30/2022    3:05 PM 04/09/2022   10:04 AM  GAD 7 : Generalized Anxiety Score  Nervous, Anxious, on Edge 0 0 1 0  Control/stop worrying 0 0 1 0  Worry too much - different things 0 0 1 0  Trouble relaxing 0 0 0 0  Restless 0 0 0 0  Easily annoyed or irritable 0 0 1 0  Afraid - awful might happen 0 0  0 0  Total GAD 7 Score 0 0 4 0  Anxiety Difficulty Not difficult at all Not difficult at all Not difficult at all Not difficult at all       04/22/2023   10:00 AM 01/07/2023    2:35 PM 10/09/2022    8:40 AM  Depression screen PHQ 2/9  Decreased Interest 0 0 3  Down, Depressed, Hopeless 0 0 0  PHQ - 2 Score 0 0 3  Altered sleeping 1 0 0  Tired, decreased energy 3 0 2  Change in appetite 0 0 0  Feeling bad or failure about yourself  0 0 0  Trouble concentrating 0 0 0  Moving slowly or fidgety/restless 0 0 3  Suicidal thoughts 0 0 0  PHQ-9 Score 4 0 8  Difficult doing work/chores Not difficult at all Not difficult at all Somewhat difficult    BP Readings from Last 3 Encounters:  04/22/23 112/60  04/21/23 133/75  02/20/23 (!) 157/80    Physical Exam Vitals and nursing note reviewed.  Constitutional:      General: She is not in acute distress.    Appearance: She is well-developed.  HENT:     Head: Normocephalic and atraumatic.  Cardiovascular:     Rate and Rhythm: Normal rate and regular rhythm.     Heart sounds: No murmur heard. Pulmonary:     Effort: Pulmonary effort is normal. No respiratory distress.     Breath sounds: No wheezing or rhonchi.  Musculoskeletal:     Cervical back: Normal range of motion.     Right lower leg: Edema present.     Left lower leg: Edema present.  Lymphadenopathy:     Cervical: No cervical adenopathy.  Skin:    General: Skin is warm and dry.     Findings: Lesion (shallow ulcer medial second toe) present. No rash.  Neurological:     General: No focal deficit present.     Mental Status: She is alert and oriented to person,  place, and time.  Psychiatric:        Mood and Affect: Mood normal.        Behavior: Behavior normal.     Wt Readings from Last 3 Encounters:  04/22/23 176 lb 12.8 oz (80.2 kg)  04/21/23 177 lb (80.3 kg)  02/20/23 171 lb 8 oz (77.8 kg)    BP 112/60   Pulse 94   Ht 5' 3 (1.6 m)   Wt 176 lb 12.8 oz (80.2 kg)   SpO2 99%   BMI 31.32 kg/m   Assessment and Plan:  Problem List Items Addressed This Visit       Unprioritized   Acquired hypothyroidism (Chronic)   Managed by Endo. Dose reduced to 125 mcg daily.      Benign essential hypertension - Primary (Chronic)   Controlled BP with normal exam. Current regimen is avapro  and chlorthalidone . Will continue same medications; encourage continued reduced sodium diet.       Stage 3b chronic kidney disease (HCC) (Chronic)   Seen by Nephrology Full workup up done - no obvious cause found other than age and HTN       Generalized anxiety disorder (Chronic)   Feels great - no anxiety or stress No side effects.      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    Latoya HILARIO Adie, MD [facility] Primary Care and Sports Medicine [place]

## 2023-04-22 NOTE — Assessment & Plan Note (Signed)
 Managed by Endo. Dose reduced to 125 mcg daily.

## 2023-04-22 NOTE — Assessment & Plan Note (Addendum)
 Continues under the care of Podiatry Medial side of right second toe - shallow ulcer She is considering a second opinion

## 2023-04-22 NOTE — Telephone Encounter (Signed)
Per the pt that she has 2-3 pills of anastrozole. I put in for 3 months upply and 1refill and [facility].

## 2023-04-22 NOTE — Assessment & Plan Note (Addendum)
 Seen by Nephrology Full workup up done - no obvious cause found other than age and HTN

## 2023-04-22 NOTE — Assessment & Plan Note (Signed)
 Feels great - no anxiety or stress No side effects.

## 2023-04-22 NOTE — Assessment & Plan Note (Signed)
 Controlled BP with normal exam. Current regimen is avapro  and chlorthalidone . Will continue same medications; encourage continued reduced sodium diet.

## 2023-05-05 LAB — TSH: TSH: 0.11 — AB (ref 0.41–5.90)

## 2023-06-09 ENCOUNTER — Other Ambulatory Visit: Payer: Self-pay | Admitting: Internal Medicine

## 2023-06-09 DIAGNOSIS — E782 Mixed hyperlipidemia: Secondary | ICD-10-CM

## 2023-06-09 DIAGNOSIS — F411 Generalized anxiety disorder: Secondary | ICD-10-CM

## 2023-06-09 DIAGNOSIS — K219 Gastro-esophageal reflux disease without esophagitis: Secondary | ICD-10-CM

## 2023-06-11 NOTE — Telephone Encounter (Signed)
 Refilled for mail order delivery Requested Prescriptions  Pending Prescriptions Disp Refills   atorvastatin (LIPITOR) 40 MG tablet [Pharmacy Med Name: Atorvastatin Calcium Oral Tablet 40 MG] 90 tablet 0    Sig: TAKE 1 TABLET AT BEDTIME     Cardiovascular:  Antilipid - Statins Failed - 06/11/2023  9:35 AM      Failed - Lipid Panel in normal range within the last 12 months    Cholesterol, Total  Date Value Ref Range Status  10/09/2022 122 100 - 199 mg/dL Final   LDL Chol Calc (NIH)  Date Value Ref Range Status  10/09/2022 68 0 - 99 mg/dL Final   HDL  Date Value Ref Range Status  10/09/2022 36 (L) >39 mg/dL Final   Triglycerides  Date Value Ref Range Status  10/09/2022 94 0 - 149 mg/dL Final         Passed - Patient is not pregnant      Passed - Valid encounter within last 12 months    Recent Outpatient Visits           1 month ago Benign essential hypertension   [place] [facility] at [facility], Nyoka Cowden, MD       Future Appointments             In 4 months Reubin Milan, MD [facility] at [facility] [place]             omeprazole (PRILOSEC) 40 MG capsule [Pharmacy Med Name: Omeprazole Oral Capsule Delayed Release 40 MG] 90 capsule 0    Sig: TAKE 1 CAPSULE EVERY DAY BEFORE BREAKFAST     Gastroenterology: Proton Pump Inhibitors Passed - 06/11/2023  9:35 AM      Passed - Valid encounter within last 12 months    Recent Outpatient Visits           1 month ago Benign essential hypertension   [place] [facility] at [facility], Nyoka Cowden, MD       Future Appointments             In 4 months Judithann Graves Nyoka Cowden, MD [facility] Medicine at [facility] [place]             sertraline (ZOLOFT) 50 MG tablet [Pharmacy Med Name: Sertraline HCl Oral Tablet 50 MG] 90 tablet 0    Sig: TAKE 1 TABLET EVERY DAY     Psychiatry:   Antidepressants - SSRI - sertraline Passed - 06/11/2023  9:35 AM      Passed - AST in normal range and within 360 days    AST  Date Value Ref Range Status  10/09/2022 19 0 - 40 IU/L Final         Passed - ALT in normal range and within 360 days    ALT  Date Value Ref Range Status  10/09/2022 18 0 - 32 IU/L Final         Passed - Completed PHQ-2 or PHQ-9 in the last 360 days      Passed - Valid encounter within last 6 months    Recent Outpatient Visits           1 month ago Benign essential hypertension   [place] [facility] at [facility], Nyoka Cowden, MD       Future Appointments  In 4 months Judithann Graves, Nyoka Cowden, MD [facility] Medicine at [facility], [facility]

## 2023-06-25 ENCOUNTER — Ambulatory Visit: Payer: Self-pay

## 2023-06-25 ENCOUNTER — Ambulatory Visit (INDEPENDENT_AMBULATORY_CARE_PROVIDER_SITE_OTHER): Admitting: Family Medicine

## 2023-06-25 ENCOUNTER — Encounter: Payer: Self-pay | Admitting: Family Medicine

## 2023-06-25 VITALS — BP 128/78 | HR 70 | Ht 63.0 in | Wt 176.4 lb

## 2023-06-25 DIAGNOSIS — L03116 Cellulitis of left lower limb: Secondary | ICD-10-CM | POA: Diagnosis not present

## 2023-06-25 MED ORDER — DOXYCYCLINE HYCLATE 100 MG PO TABS: 100.0000 mg | ORAL_TABLET | Freq: Two times a day (BID) | ORAL | 0 refills | Status: AC

## 2023-06-25 NOTE — Telephone Encounter (Signed)
 Chief Complaint: Possible wound infection Symptoms: drainage Frequency: Yesterday evening Pertinent Negatives: Patient denies swelling, fever, redness Disposition: [] ED /[] Urgent Care (no appt availability in office) / [x] Appointment(In office/virtual)/ []  [place] [facility]/ [] Home Care/ [] Refused Recommended Disposition /[] [place] [facility]/ []  Follow-up with PCP Additional Notes: Pt reports old wound from injury in 21/22, yesterday evening she notes large amounts of drainage "oozing" out, she reports last evening it was "pink-tinged" but reports this AM it is yellow. Pt denies fever, pain, swelling. OV scheduled. This RN educated pt on home care, new-worsening symptoms, when to call back/seek emergent care. Pt verbalized understanding and agrees to plan.     Copied from CRM [id number]. Topic: Clinical - Red Word Triage >> Jun 25, 2023 10:31 AM Fuller Mandril wrote: Red Word that prompted transfer to Nurse Triage: Damaged leg - leaking fluid, has pink in it, like blood Reason for Disposition  Wound infection suspected by triager (e.g., other signs of wound infection)  Answer Assessment - Initial Assessment Questions 1. LOCATION: "Where is the wound located?"      Left lower leg, near ankle 2. WOUND APPEARANCE: "What does the wound look like?"      "Oozing fluid" new drainage, "pink tinged" 4. SPREAD: "What's changed in the last day?"  "Do you see any red streaks coming from the wound?"     No redness 5. ONSET: "When did it start to look infected?"      Yesterday evening 6. MECHANISM: "How did the wound start, what was the cause?"     Chronic wound, old injury 7. PAIN: Do you have any pain?"  If Yes, ask: "How bad is the pain?"  (e.g., Scale 1-10; mild, moderate, or severe)    - MILD (1-3): Doesn't interfere with normal activities.     - MODERATE (4-7): Interferes with normal activities or awakens from sleep.    - SEVERE (8-10): Excruciating pain, unable to do any normal  activities.       None 8. FEVER: "Do you have a fever?" If Yes, ask: "What is your temperature, how was it measured, and when did it start?"     None 9. OTHER SYMPTOMS: "Do you have any other symptoms?" (e.g., shaking chills, weakness, rash elsewhere on body)     None  Protocols used: Wound Infection Suspected-A-AH

## 2023-06-25 NOTE — Progress Notes (Signed)
 Primary Care / Sports Medicine Office Visit  Patient Information:  Patient ID: Latoya Stevenson, female DOB: June 02, 1940 Age: 83 y.o. MRN: [id number]   Latoya Stevenson is a pleasant 83 y.o. female presenting with the following:  Chief Complaint  Patient presents with   weeping leg    Left leg, patient said she has a weeping leg, drainage     Vitals:   06/25/23 1314  BP: 128/78  Pulse: 70  SpO2: 97%   Vitals:   06/25/23 1314  Weight: 176 lb 6 oz (80 kg)  Height: 5\' 3"  (1.6 m)   Body mass index is 31.24 kg/m.  No results found.   Independent interpretation of notes and tests performed by another provider:   None  Procedures performed:   None  Pertinent History, Exam, Impression, and Recommendations:   Problem List Items Addressed This Visit     Left leg cellulitis - Primary   History of Present Illness Latoya Stevenson is an 83 year old female who presents with weeping and drainage from the left leg x 1 day.  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  She experiences weeping and drainage from her left leg, which began last night. Her sock was soaking wet after waking up from sleeping in her recliner. She has placed a tissue on the affected area to manage the drainage. No fevers or chills are present.  She has a history of similar episodes occurring approximately two years ago, for which she saw vein specialists who managed the swelling with leg wraps. She has a history of cellulitis, which has required emergency room visits in the past. Her legs are currently swollen on both sides at baseline, with a baseline left lower leg discoloration from past cellulitis, per patient.  She mentions having cats at home but does not believe they have scratched her. However, she recalls a recent bite on her leg that itched for several days, which she suspects might have been from an insect.  She is scheduled for surgery on her foot on  the 29th of this month to address an ulcer caused by a bunion. She has been waiting for this surgery for two years and is concerned about maintaining her health leading up to the procedure.  Physical Exam INSPECTION: Bilateral lower leg swelling with 2+ pitting edema.  Left lower leg with transverse 4-5 cm superficial laceration, clear-yellowish thin liquid draining, surrounding hyperpigmented appearance, possible hemosiderin deposition.  Results Procedure: Wound dressing Description: Area wrapped with gauze and bacitracin ointment applied to the skin  Assessment and Plan Leg edema with weeping, Cellulitis concern Acute weeping from the left leg due to decreased circulation and fluid retention. Clear fluid drainage with intermediate infection signs. Concern for infection due to decreased circulation and recent exposure history. - Prescribed doxycycline for 7 days for potential infections. - Apply bacitracin and wrap with gauze. - Keep area clean and dry. - Call if symptoms worsen, such as spreading, fever, or increased drainage.        Orders & Medications Medications:  Meds ordered this encounter  Medications   doxycycline (VIBRA-TABS) 100 MG tablet    Sig: Take 1 tablet (100 mg total) by mouth 2 (two) times daily for 5 days.    Dispense:  10 tablet    Refill:  0   No orders of the defined types were placed in this encounter.    No follow-ups on file.  Jerrol Banana, MD, [facility]   Primary Care Sports Medicine Primary Care and Sports Medicine at [facility]

## 2023-06-25 NOTE — Assessment & Plan Note (Signed)
 History of Present Illness Latoya Stevenson is an 83 year old female who presents with weeping and drainage from the left leg x 1 day.  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  She experiences weeping and drainage from her left leg, which began last night. Her sock was soaking wet after waking up from sleeping in her recliner. She has placed a tissue on the affected area to manage the drainage. No fevers or chills are present.  She has a history of similar episodes occurring approximately two years ago, for which she saw vein specialists who managed the swelling with leg wraps. She has a history of cellulitis, which has required emergency room visits in the past. Her legs are currently swollen on both sides at baseline, with a baseline left lower leg discoloration from past cellulitis, per patient.  She mentions having cats at home but does not believe they have scratched her. However, she recalls a recent bite on her leg that itched for several days, which she suspects might have been from an insect.  She is scheduled for surgery on her foot on the 29th of this month to address an ulcer caused by a bunion. She has been waiting for this surgery for two years and is concerned about maintaining her health leading up to the procedure.  Physical Exam INSPECTION: Bilateral lower leg swelling with 2+ pitting edema.  Left lower leg with transverse 4-5 cm superficial laceration, clear-yellowish thin liquid draining, surrounding hyperpigmented appearance, possible hemosiderin deposition.  Results Procedure: Wound dressing Description: Area wrapped with gauze and bacitracin ointment applied to the skin  Assessment and Plan Leg edema with weeping, Cellulitis concern Acute weeping from the left leg due to decreased circulation and fluid retention. Clear fluid drainage with intermediate infection signs. Concern for infection due to decreased circulation and  recent exposure history. - Prescribed doxycycline for 7 days for potential infections. - Apply bacitracin and wrap with gauze. - Keep area clean and dry. - Call if symptoms worsen, such as spreading, fever, or increased drainage.

## 2023-06-25 NOTE — Patient Instructions (Signed)
 Patient Plan for Post-Visit Guidance  1. Left Leg Care:    - Take doxycycline as prescribed for 7 days to treat potential infection.    - Apply bacitracin ointment to the affected area and wrap it with gauze.    - Keep the area clean and dry at all times.    - Monitor the leg for any worsening symptoms, such as increased redness, swelling, fever, or more drainage. Contact us immediately if these occur.  Please follow these instructions and contact us if you have any questions or concerns.

## 2023-06-25 NOTE — Telephone Encounter (Signed)
 Noted  Pt has a appt.  KP

## 2023-06-26 DIAGNOSIS — M2011 Hallux valgus (acquired), right foot: Secondary | ICD-10-CM | POA: Insufficient documentation

## 2023-06-26 DIAGNOSIS — L97511 Non-pressure chronic ulcer of other part of right foot limited to breakdown of skin: Secondary | ICD-10-CM | POA: Insufficient documentation

## 2023-07-01 ENCOUNTER — Other Ambulatory Visit: Payer: Self-pay | Admitting: Internal Medicine

## 2023-07-02 NOTE — Telephone Encounter (Signed)
 Requested Prescriptions  Pending Prescriptions Disp Refills   irbesartan (AVAPRO) 300 MG tablet [Pharmacy Med Name: Irbesartan Oral Tablet 300 MG] 90 tablet 0    Sig: TAKE 1 TABLET EVERY DAY     Cardiovascular:  Angiotensin Receptor Blockers Failed - 07/02/2023 11:36 AM      Failed - Cr in normal range and within 180 days    Creatinine, Ser  Date Value Ref Range Status  10/09/2022 1.42 (H) 0.57 - 1.00 mg/dL Final         Failed - K in normal range and within 180 days    Potassium  Date Value Ref Range Status  10/09/2022 4.0 3.5 - 5.2 mmol/L Final         Passed - Patient is not pregnant      Passed - Last BP in normal range    BP Readings from Last 1 Encounters:  06/25/23 128/78         Passed - Valid encounter within last 6 months    Recent Outpatient Visits           1 week ago Left leg cellulitis   [place] [facility] Led[facility], Dessie Flow, MD   2 months ago Benign essential hypertension   [facility] at [facility], Chales Colorado, MD       Future Appointments             In 3 months Gala Jubilee, Chales [place], MD [facility] at [facility], [facility]

## 2023-08-27 ENCOUNTER — Other Ambulatory Visit: Payer: Self-pay | Admitting: Internal Medicine

## 2023-08-27 DIAGNOSIS — F411 Generalized anxiety disorder: Secondary | ICD-10-CM

## 2023-08-27 DIAGNOSIS — E782 Mixed hyperlipidemia: Secondary | ICD-10-CM

## 2023-08-27 DIAGNOSIS — K219 Gastro-esophageal reflux disease without esophagitis: Secondary | ICD-10-CM

## 2023-09-14 ENCOUNTER — Telehealth: Payer: Self-pay

## 2023-09-14 NOTE — Telephone Encounter (Signed)
 Patient called regarding needing refill on arimidex  however last note on RX patient must be seen by MD next apt is 8/4. Please call patient back with recommendation

## 2023-09-14 NOTE — Telephone Encounter (Signed)
 I saw her in feb 2025 and she keeps her appts. Ok to refill arimidex 

## 2023-09-15 ENCOUNTER — Other Ambulatory Visit: Payer: Self-pay | Admitting: Internal Medicine

## 2023-09-15 ENCOUNTER — Other Ambulatory Visit: Payer: Self-pay

## 2023-09-15 MED ORDER — ANASTROZOLE 1 MG PO TABS: ORAL_TABLET | ORAL | 0 refills | Status: DC

## 2023-09-16 NOTE — Telephone Encounter (Signed)
 Requested Prescriptions  Pending Prescriptions Disp Refills   irbesartan  (AVAPRO ) 300 MG tablet [Pharmacy Med Name: Irbesartan  Oral Tablet 300 MG] 90 tablet 0    Sig: TAKE 1 TABLET EVERY DAY     Cardiovascular:  Angiotensin Receptor Blockers Failed - 09/16/2023  2:43 PM      Failed - Cr in normal range and within 180 days    Creatinine, Ser  Date Value Ref Range Status  10/09/2022 1.42 (H) 0.57 - 1.00 mg/dL Final         Failed - K in normal range and within 180 days    Potassium  Date Value Ref Range Status  10/09/2022 4.0 3.5 - 5.2 mmol/L Final         Passed - Patient is not pregnant      Passed - Last BP in normal range    BP Readings from Last 1 Encounters:  06/25/23 128/78         Passed - Valid encounter within last 6 months    Recent Outpatient Visits           2 months ago Left leg cellulitis   [place] [facility], Selinda PARAS, MD   4 months ago Benign essential hypertension   [facility] at [facility], Leita DEL, MD       Future Appointments             In 1 month Justus, Leita DEL, MD [facility] at [facility], [facility]

## 2023-09-25 ENCOUNTER — Telehealth: Payer: Self-pay | Admitting: *Deleted

## 2023-09-25 NOTE — Telephone Encounter (Signed)
 The patient called and said that the prescription was not written correctly and that is why she could not get it.  I spoke to Lauren and I asked her if I can do the prescription and then she would have to sign it because Dr. Helena is on vacation at this time.  On the prescription that they originally put in it looks like they want to have you seen before getting a refill of the prescription the last time you had seen Dr. Melanee was on April 21, 2023 and your next appointment is in August so I put that in comments so hopefully they will take it and I made sure it was a 90-day supply.

## 2023-09-28 ENCOUNTER — Telehealth: Payer: Self-pay | Admitting: Oncology

## 2023-09-28 NOTE — Telephone Encounter (Signed)
 Patient is calling to get prescription corrected at [facility] [phone]  Anastrozole  1mg  tablet  She states she has been trying to get this taken care of for 2 weeks and is out of medicine.  The prescription was sent in but no quantity and no directions. She ask that we please update the pharmacy so she can get her meds.

## 2023-09-29 ENCOUNTER — Telehealth: Payer: Self-pay | Admitting: *Deleted

## 2023-09-29 ENCOUNTER — Other Ambulatory Visit: Payer: Self-pay | Admitting: *Deleted

## 2023-09-29 ENCOUNTER — Other Ambulatory Visit: Payer: Self-pay

## 2023-09-29 MED ORDER — ANASTROZOLE 1 MG PO TABS: 1.0000 mg | ORAL_TABLET | Freq: Every day | ORAL | 0 refills | Status: DC

## 2023-09-29 MED ORDER — ANASTROZOLE 1 MG PO TABS: ORAL_TABLET | ORAL | 0 refills | Status: DC

## 2023-09-29 NOTE — Telephone Encounter (Signed)
Prescription re-sent with updated instructions

## 2023-09-29 NOTE — Telephone Encounter (Signed)
 Outbound call to patient, informed of below.  Patient states it is not the Arimidex  that she needed a refill for but for her thyroid  medication.  Informed Levothyroxine  is not managed by Dr. Melanee and patient replied I know I don't know they keep sending it.  No further questions / concerns noted.

## 2023-09-29 NOTE — Telephone Encounter (Signed)
 I called over to [facility] and checked on the anastrozole .  Per the staff that I spoke to the put it on fast to the go and it is a mail order and she said it will do 3 to 5 days to get it to her and they will try to get it out today.  Also I called the patient and let her know.

## 2023-10-03 ENCOUNTER — Other Ambulatory Visit: Payer: Self-pay | Admitting: Internal Medicine

## 2023-10-05 NOTE — Telephone Encounter (Signed)
 Requested Prescriptions  Pending Prescriptions Disp Refills   fluticasone  (FLONASE ) 50 MCG/ACT nasal spray [Pharmacy Med Name: FLUTICASONE  PROPIONATE 50 MCG/ACT Nasal Suspension] 16 g 3    Sig: USE 2 SPRAYS IN EACH NOSTRIL EVERY DAY AS NEEDED FOR ALLERGIES     Ear, Nose, and Throat: Nasal Preparations - Corticosteroids Passed - 10/05/2023  5:05 PM      Passed - Valid encounter within last 12 months    Recent Outpatient Visits           3 months ago Left leg cellulitis   [place] [facility], Selinda PARAS, MD   5 months ago Benign essential hypertension   [facility] at [facility], Leita DEL, MD       Future Appointments             In 1 month Justus, Leita DEL, MD [facility] at [facility], [facility]

## 2023-10-12 ENCOUNTER — Encounter: Payer: Self-pay | Admitting: Internal Medicine

## 2023-10-15 LAB — COMPREHENSIVE METABOLIC PANEL WITH GFR
Calcium: 9.2 (ref 8.7–10.7)
eGFR: 32

## 2023-10-15 LAB — BASIC METABOLIC PANEL WITH GFR
BUN: 28 — AB (ref 4–21)
CO2: 24 — AB (ref 13–22)
Chloride: 107 (ref 99–108)
Creatinine: 1.6 — AB (ref 0.5–1.1)
Glucose: 100
Potassium: 3.8 meq/L (ref 3.5–5.1)
Sodium: 141 (ref 137–147)

## 2023-10-19 ENCOUNTER — Encounter: Payer: Self-pay | Admitting: Nurse Practitioner

## 2023-10-19 ENCOUNTER — Inpatient Hospital Stay: Payer: Medicare HMO | Attending: Oncology

## 2023-10-19 ENCOUNTER — Inpatient Hospital Stay (HOSPITAL_BASED_OUTPATIENT_CLINIC_OR_DEPARTMENT_OTHER): Payer: Medicare HMO | Admitting: Nurse Practitioner

## 2023-10-19 VITALS — BP 139/75 | HR 70 | Temp 97.9°F | Resp 16 | Wt 174.0 lb

## 2023-10-19 DIAGNOSIS — Z08 Encounter for follow-up examination after completed treatment for malignant neoplasm: Secondary | ICD-10-CM | POA: Diagnosis not present

## 2023-10-19 DIAGNOSIS — N1832 Chronic kidney disease, stage 3b: Secondary | ICD-10-CM

## 2023-10-19 DIAGNOSIS — D631 Anemia in chronic kidney disease: Secondary | ICD-10-CM | POA: Diagnosis not present

## 2023-10-19 DIAGNOSIS — D649 Anemia, unspecified: Secondary | ICD-10-CM | POA: Diagnosis not present

## 2023-10-19 DIAGNOSIS — R5383 Other fatigue: Secondary | ICD-10-CM

## 2023-10-19 DIAGNOSIS — I10 Essential (primary) hypertension: Secondary | ICD-10-CM | POA: Diagnosis not present

## 2023-10-19 DIAGNOSIS — Z79811 Long term (current) use of aromatase inhibitors: Secondary | ICD-10-CM | POA: Diagnosis not present

## 2023-10-19 DIAGNOSIS — C50911 Malignant neoplasm of unspecified site of right female breast: Secondary | ICD-10-CM | POA: Diagnosis not present

## 2023-10-19 DIAGNOSIS — Z5181 Encounter for therapeutic drug level monitoring: Secondary | ICD-10-CM

## 2023-10-19 DIAGNOSIS — N2581 Secondary hyperparathyroidism of renal origin: Secondary | ICD-10-CM | POA: Diagnosis not present

## 2023-10-19 DIAGNOSIS — Z853 Personal history of malignant neoplasm of breast: Secondary | ICD-10-CM

## 2023-10-19 LAB — CBC WITH DIFFERENTIAL (CANCER CENTER ONLY)
Abs Immature Granulocytes: 0.01 K/uL (ref 0.00–0.07)
Basophils Absolute: 0 K/uL (ref 0.0–0.1)
Basophils Relative: 1 %
Eosinophils Absolute: 0.2 K/uL (ref 0.0–0.5)
Eosinophils Relative: 5 %
HCT: 35.5 % — ABNORMAL LOW (ref 36.0–46.0)
Hemoglobin: 11 g/dL — ABNORMAL LOW (ref 12.0–15.0)
Immature Granulocytes: 0 %
Lymphocytes Relative: 26 %
Lymphs Abs: 1.2 K/uL (ref 0.7–4.0)
MCH: 28.3 pg (ref 26.0–34.0)
MCHC: 31 g/dL (ref 30.0–36.0)
MCV: 91.3 fL (ref 80.0–100.0)
Monocytes Absolute: 0.4 K/uL (ref 0.1–1.0)
Monocytes Relative: 10 %
Neutro Abs: 2.6 K/uL (ref 1.7–7.7)
Neutrophils Relative %: 58 %
Platelet Count: 165 K/uL (ref 150–400)
RBC: 3.89 MIL/uL (ref 3.87–5.11)
RDW: 14.8 % (ref 11.5–15.5)
WBC Count: 4.5 K/uL (ref 4.0–10.5)
nRBC: 0 % (ref 0.0–0.2)

## 2023-10-19 LAB — CBC AND DIFFERENTIAL
HCT: 36 (ref 36–46)
Hemoglobin: 11 — AB (ref 12.0–16.0)
Platelets: 165 K/uL (ref 150–400)
WBC: 4.5

## 2023-10-19 LAB — FERRITIN: Ferritin: 32 ng/mL (ref 11–307)

## 2023-10-19 LAB — IRON AND TIBC
Iron: 68 ug/dL (ref 28–170)
Saturation Ratios: 21 % (ref 10.4–31.8)
TIBC: 328 ug/dL (ref 250–450)
UIBC: 260 ug/dL

## 2023-10-19 LAB — FOLATE: Folate: >40 ng/mL (ref >5.9)

## 2023-10-19 NOTE — Progress Notes (Signed)
 Hematology/Oncology Consult Note [facility]  [phone] [phone]  Patient Care Team: Justus Leita DEL, MD as PCP - General (Internal Medicine) Melanee Annah BROCKS, MD as Consulting Physician (Oncology) Lenn Aran, MD as Referring Physician (Radiation Oncology) Lane Shope, MD as Consulting Physician (General Surgery) Cindie Jesusa HERO, RN as Registered Nurse Tobie Lady POUR, MD as Consulting Physician (Rheumatology) Damian Therisa HERO, MD as Physician Assistant (Endocrinology) Bosie Vinie LABOR, MD as Consulting Physician (Cardiology) Aundria Ladell POUR, MD as Consulting Physician (Gastroenterology) Neill Boas, DPM (Podiatry) Lenn Standing, MD (Ophthalmology)   Name of the patient: Latoya Stevenson  [id number]  1940-09-08   Date of visit: 10/19/23  Diagnosis-  pathological prognostic 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  She then noticed possible nipple discharge from her right breast which prompted a diagnostic right breast mammogram which was essentially unremarkable.  MRI was recommended and patient underwent a bilateral MRI which did not show any concerning findings in the right breast where she had the nipple discharge but showed an enhancing mass in the inner left breast measuring 0.9 x 0.6 x 0.7 cm which was suspicious and biopsy. Biopsy was consistent with invasive mammary carcinoma grade 2 6 mm ER greater than 90% positive PR greater than 90% positive and HER-2 negative.   Genetic testing negative   Final pathology showed 7 mm grade 2 invasive mammary carcinoma with positive unifocal anterior margin.  1 sentinel lymph node negative  for malignancy.  PT1BPN0 tumor was greater than 90% ER positive PR positive and HER-2 negative   Patient completed adjuvant radiation therapy comlpeted 8//21 and started Arimidex  in October 2021.  She has baseline osteoporosis and follows up with endocrinology.  She gets Prolia   Interval history-patient is 83 year old female with above history of breast cancer, completed radiation in August 2021 and continues Arimidex  since October 2021 who returns to clinic for continued surveillance.  She continues to recover from her foot surgery.  Is scheduled to see nephrology this afternoon.  Concerned about cost of her prolia . Denies breast skin changes, pain, discharge, or masses. Denies unintentional weight loss or new bone pain.  Denies worsening aches and pains related to her Arimidex .  ECOG PS- 1 Pain scale- 0  Review of systems- Review of Systems  Constitutional:  Negative for chills, fever, malaise/fatigue and weight loss.  HENT:  Negative for congestion, ear discharge and nosebleeds.   Eyes:  Negative for blurred vision.  Respiratory:  Negative for cough, hemoptysis, sputum production, shortness of breath and wheezing.   Cardiovascular:  Negative for chest pain, palpitations, orthopnea and claudication.  Gastrointestinal:  Positive for constipation. Negative for abdominal pain, blood in stool, diarrhea, heartburn, melena, nausea and vomiting.  Genitourinary:  Negative for dysuria, flank pain, frequency, hematuria and urgency.  Musculoskeletal:  Negative for back pain, joint pain and myalgias.       Foot pain  Skin:  Negative for rash.  Neurological:  Negative for dizziness, tingling, focal weakness, seizures, weakness and headaches.  Endo/Heme/Allergies:  Does not bruise/bleed easily.  Psychiatric/Behavioral:  Negative for depression and suicidal ideas. The patient does not have insomnia.     Allergies  Allergen Reactions   Flagyl [Metronidazole Hcl] Swelling  Swelling in face, mouth,  throat, palms of feet and hands.   Furosemide Itching    Made side of mouth irritated and puffy.  The rim inside her lip swelled up   Macrodantin [Nitrofurantoin Macrocrystal] Anaphylaxis, Hives and Swelling   Penicillins Anaphylaxis and Swelling    PATIENT HAD A PCN REACTION WITH IMMEDIATE RASH, FACIAL/TONGUE/THROAT SWELLING, SOB, OR LIGHTHEADEDNESS WITH HYPOTENSION:  #  #  #  YES  #  #  #   Has patient had a PCN reaction causing severe rash involving mucus membranes or skin necrosis: No Has patient had a PCN reaction that required hospitalization: No Has patient had a PCN reaction occurring within the last 10 years: No If all of the above answers are NO, then may proceed with Cephalosporin use.    Celebrex [Celecoxib] Swelling    Swelling in joints.  Did not agree with her. Did not help her either   Other     Pt says it was recently discovered she allergic to an ingredient in local anesthesia but she can't recall the name. (epinephrine  and the additive used for the biopsy. Gets very nervous. Feels internally shaking. 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 (2018 and 2021)   Past Medical History:  Diagnosis Date   Allergic rhinitis    Anemia    low iron   Anxiety    Arthritis 07/2019   osteoporosis   Asthma without status asthmaticus    seasonal, not often   Bilateral sciatica    Breast cancer (HCC) 07/2019   invasive mammary cancer , grade 2   Bronchitis    Cancer (HCC)     follicular variant papillary thyroid  cancer   Chronic back pain    unspecified   Chronic kidney disease    ckd stage 3b   Colon polyp    adenomatous   DDD (degenerative disc disease), lumbar    DJD (degenerative joint disease)    Dysphagia    Dyspnea    E coli bacteremia    around 10/11   Environmental allergies    Family history of breast cancer    Family history of colon cancer    Family history of ovarian cancer    Family history of pancreatic cancer    Family history of  prostate cancer    Family history of thyroid  cancer    GERD (gastroesophageal reflux disease)    Headache    migraines as a young woman   History of recurrent UTIs    Hyperlipidemia    Hypertension    Hypothyroidism    IBS (irritable bowel syndrome)    Lumbar compression fracture (HCC) 12/02/2016   Lumbar disc disease    Mold exposure 2002   hx of    Osteoporosis, post-menopausal    Personal history of radiation therapy    Pneumonia 1978   Restless legs    Scoliosis    Spinal stenosis    Thyroid  nodule    aspirated in 1997 and 2003 which were negative   Ulcer    Varicose veins of left lower extremity    Vertigo    Wears dentures    Past Surgical History:  Procedure Laterality Date   ABDOMINAL HYSTERECTOMY  1977   Dr. Darra MO FRACTURE SURGERY Left 2003   arch of foot.  no metal. per patient, she did not have surgery for this fracture. healed on its own   ANTERIOR LAT LUMBAR FUSION Left 09/09/2016  Procedure: Left Lumbar two-three Anterolateral lumbar interbody fusion with lateral plate;  Surgeon: Unice Pac, MD;  Location: [facility] OR;  Service: Neurosurgery;  Laterality: Left;  Left L2-3 Anterolateral lumbar interbody fusion with lateral plate   APPENDECTOMY     BACK SURGERY  2013, 2016, 2018   lumbar lam, fusion, discectomy.titanium   BIOPSY THYROID      x2  thyroid  nodule ; Dr. Rollene and one by Dr. Cleotilde   BREAST BIOPSY Left 08/02/2019   coil clip-INVASIVE MAMMARY CARCINOMA   BREAST CYST ASPIRATION  1970's   BREAST LUMPECTOMY Left 2021   BREAST LUMPECTOMY WITH RADIOFREQUENCY TAG IDENTIFICATION Left 08/12/2019   Procedure: BREAST LUMPECTOMY WITH RADIOFREQUENCY TAG IDENTIFICATION;  Surgeon: Lane Shope, MD;  Location: [facility] ORS;  Service: General;  Laterality: Left;   BREAST LUMPECTOMY WITH SENTINEL LYMPH NODE BIOPSY Left 08/12/2019   Procedure: BREAST LUMPECTOMY WITH SENTINEL LYMPH NODE BX;  Surgeon: Lane Shope, MD;  Location: [facility] ORS;  Service: General;   Laterality: Left;   broken arm Left 1994   wrist fracture surgery, left.  metal has been replaced.   CARDIOVASCULAR STRESS TEST     2010   COLONOSCOPY  2010   COLONOSCOPY  07/21/2013   PHX OF CP/REPEAT 63YRS/[place]   ESOPHAGOGASTRODUODENOSCOPY (EGD) WITH PROPOFOL  N/A 09/22/2018   Procedure: ESOPHAGOGASTRODUODENOSCOPY (EGD) WITH PROPOFOL ;  Surgeon: Toledo, Ladell POUR, MD;  Location: [facility] ENDOSCOPY;  Service: Gastroenterology;  Laterality: N/A;  Patient to have Rapid COVID-19 day of procedure Nan approved this)   EYE SURGERY Bilateral    cataract surgery with lens implant   FOREIGN BODY REMOVAL N/A 12/09/2019   Procedure: FOREIGN BODY REMOVAL ADULT;  Surgeon: Kathlynn Sharper, MD;  Location: [facility] ORS;  Service: Orthopedics;  Laterality: N/A;   INCISION AND DRAINAGE Left 12/09/2019   Procedure: INCISION AND DRAINAGE;  Surgeon: Kathlynn Sharper, MD;  Location: [facility] ORS;  Service: Orthopedics;  Laterality: Left;   LACERATION REPAIR Bilateral 12/09/2019   Procedure: REPAIR MULTIPLE LACERATIONS;  Surgeon: Kathlynn Sharper, MD;  Location: [facility] ORS;  Service: Orthopedics;  Laterality: Bilateral;   LUMBAR LAMINECTOMY/DECOMPRESSION MICRODISCECTOMY  11/18/2011   Procedure: LUMBAR LAMINECTOMY/DECOMPRESSION MICRODISCECTOMY 2 LEVELS;  Surgeon: Pac Unice, MD;  Location: [facility];  Service: Neurosurgery;  Laterality: N/A;  Lumbar Three-Four,  Lumbar Four-Five Decompressive Laminectomy   MALONEY DILATION N/A 09/22/2018   Procedure: MALONEY DILATION;  Surgeon: Toledo, Ladell POUR, MD;  Location: [facility] ENDOSCOPY;  Service: Gastroenterology;  Laterality: N/A;   OVARIAN CYST SURGERY     POSTERIOR FUSION LUMBAR SPINE  2016   TOTAL THYROIDECTOMY     TRANSTHORACIC ECHOCARDIOGRAM  2010   upper and lower GI  2010   Social History   Socioeconomic History   Marital status: Widowed    Spouse name: Not on file   Number of children: 2   Years of education: Not on file   Highest education level: Not on file  Occupational History    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  Social Drivers of Corporate investment banker Strain: Low Risk  (01/07/2023)   Overall Financial Resource Strain (CARDIA)    Difficulty of Paying Living Expenses: Not hard at all  Food Insecurity: No Food Insecurity (01/07/2023)   Hunger Vital Sign    Worried About Running Out of Food in the Last Year: Never true    Ran Out of Food in the Last Year: Never true  Transportation Needs: No Transportation Needs (01/07/2023)   PRAPARE - Administrator, Civil Service (Medical): No    Lack of Transportation (Non-Medical): No  Physical Activity: Insufficiently Active (01/07/2023)   Exercise Vital Sign    Days of Exercise per Week: 3 days    Minutes of Exercise per Session: 30 min  Stress: No Stress Concern Present (01/07/2023)   [employer] of Occupational Health - Occupational Stress Questionnaire    Feeling of Stress : Not at all  Social Connections: Socially Isolated (01/07/2023)   Social Connection and Isolation Panel    Frequency of Communication with Friends and Family: More than three times a week    Frequency of Social Gatherings with Friends and Family: Three times a week    Attends Religious Services: Never    Active Member of Clubs or Organizations: No    Attends Banker Meetings: Never    Marital Status: Widowed  Intimate Partner Violence: Not At Risk (01/07/2023)   Humiliation, Afraid, Rape, and Kick questionnaire    Fear of Current or Ex-Partner: No     Emotionally Abused: No    Physically Abused: No    Sexually Abused: No   Family History  Problem Relation Age of Onset   Breast cancer Mother 32   Thyroid  cancer Mother    Pancreatic cancer Brother    Pancreatic cancer Sister    Suicidality Father    Prostate cancer Brother    Colon cancer Brother    Breast cancer Sister        x 2 times   Heart attack Sister    Ovarian cancer Maternal Aunt    Breast cancer Paternal Aunt    Lung cancer Paternal Uncle    Breast cancer Other    Colon cancer Maternal Aunt    Pancreatic cancer Maternal Aunt    Current Outpatient Medications:    acetaminophen  (TYLENOL ) 650 MG CR tablet, Take 1,300 mg by mouth every 8 (eight) hours as needed for pain., Disp: , Rfl:    anastrozole  (ARIMIDEX ) 1 MG tablet, Take 1 tablet (1 mg total) by mouth daily., Disp: 90 tablet, Rfl: 0   Ascorbic Acid (VITAMIN C) 1000 MG tablet, Take 1,000 mg by mouth daily., Disp: , Rfl:    atorvastatin  (LIPITOR) 40 MG tablet, TAKE 1 TABLET AT BEDTIME, Disp: 90 tablet, Rfl: 0   Calcium  Carbonate-Vitamin D  600-400 MG-UNIT tablet, Take 2 tablets by mouth daily., Disp: , Rfl:    chlorthalidone  (HYGROTON ) 50 MG tablet, Take 1 tablet by mouth 2 (two) times daily., Disp: , Rfl:    cholecalciferol  (VITAMIN D3) 25 MCG (1000 UNIT) tablet, Take 1,000 Units by mouth daily., Disp: , Rfl:    denosumab  (PROLIA ) 60 MG/ML SOSY injection, Inject 60 mg into the skin every 6 (six) months., Disp: , Rfl:    diclofenac  Sodium (VOLTAREN ) 1 % GEL, Apply 2 g topically 4 (four) times daily., Disp: , Rfl:    fexofenadine (ALLEGRA) 180 MG tablet, Take 180 mg by mouth daily., Disp: , Rfl:    fluticasone  (  FLONASE ) 50 MCG/ACT nasal spray, USE 2 SPRAYS IN EACH NOSTRIL EVERY DAY AS NEEDED FOR ALLERGIES, Disp: 16 g, Rfl: 3   gabapentin  (NEURONTIN ) 300 MG capsule, Take 600 mg by mouth 3 (three) times daily., Disp: , Rfl:    GLUCOSAMINE-CHONDROITIN PO, Take 1 tablet by mouth daily. , Disp: , Rfl:    irbesartan  (AVAPRO )  300 MG tablet, TAKE 1 TABLET EVERY DAY, Disp: 90 tablet, Rfl: 0   levothyroxine  (SYNTHROID ) 150 MCG tablet, Take by mouth., Disp: , Rfl:    Multiple Vitamin (MULTIVITAMIN WITH MINERALS) TABS, Take 1 tablet by mouth daily., Disp: , Rfl:    omeprazole  (PRILOSEC) 40 MG capsule, TAKE 1 CAPSULE EVERY DAY BEFORE BREAKFAST, Disp: 90 capsule, Rfl: 0   sertraline  (ZOLOFT ) 50 MG tablet, TAKE 1 TABLET EVERY DAY, Disp: 90 tablet, Rfl: 0   vitamin E  180 MG (400 UNITS) capsule, Take 400 Units by mouth daily., Disp: , Rfl:   Physical exam:  Vitals:   10/19/23 1255  BP: 139/75  Pulse: 70  Resp: 16  Temp: 97.9 F (36.6 C)  TempSrc: Tympanic  SpO2: 100%  Weight: 174 lb (78.9 kg)   Physical Exam Vitals reviewed.  Constitutional:      Appearance: She is not ill-appearing.  Pulmonary:     Effort: No respiratory distress.     Breath sounds: Normal breath sounds.  Chest:     Comments: Breast exam - deferred Abdominal:     General: There is no distension.  Musculoskeletal:     Comments: Post foot surgery  Skin:    Coloration: Skin is not pale.  Neurological:     Mental Status: She is alert and oriented to person, place, and time.  Psychiatric:        Mood and Affect: Mood normal.        Behavior: Behavior normal.       Latest Ref Rng & Units 10/09/2022    9:27 AM  CMP  Glucose 70 - 99 mg/dL 96   BUN 8 - 27 mg/dL 30   Creatinine 9.42 - 1.00 mg/dL 8.57   Sodium 865 - 855 mmol/L 140   Potassium 3.5 - 5.2 mmol/L 4.0   Chloride 96 - 106 mmol/L 106   CO2 20 - 29 mmol/L 21   Calcium  8.7 - 10.3 mg/dL 8.9   Total Protein 6.0 - 8.5 g/dL 5.9   Total Bilirubin 0.0 - 1.2 mg/dL 0.7   Alkaline Phos 44 - 121 IU/L 74   AST 0 - 40 IU/L 19   ALT 0 - 32 IU/L 18       Latest Ref Rng & Units 10/19/2023   12:35 PM  CBC  WBC 4.0 - 10.5 K/uL 4.5   Hemoglobin 12.0 - 15.0 g/dL 88.9   Hematocrit 63.9 - 46.0 % 35.5   Platelets 150 - 400 K/uL 165    Iron/TIBC/Ferritin/ %Sat    Component Value Date/Time    IRON 68 10/19/2023 1235   TIBC 328 10/19/2023 1235   FERRITIN 32 10/19/2023 1235   IRONPCTSAT 21 10/19/2023 1235    Assessment and plan- Patient is a 83 y.o. female who returns to clinic for follow up of   History of stage I left breast cancer ER/PR positive HER2 negative- last imaging was 02/02/23 with mammogram which was reported as birads 1: negative.  Clinically, asymptomatic and no evidence of recurrent disease on exam today.  Recommend annual imaging with mammogram in November 2025.  We will continue 80-month  surveillance visits.  She will continue Arimidex  and will complete 5 years of endocrine therapy in October 2026 Osteopenia-most recent bone density on 11/20/2021 and was compared to 2021, interval decrease in bone mineral density at the wrist.  Stable bone density at the left hip.  Lumbar spine not reported due to her prior surgery.  She has been taking Prolia  through endocrinology.  I encouraged her to follow-up with endocrinology and would plan to repeat bone density scan with them in September 2025.  She will continue calcium  and vitamin D  as prescribed along with weightbearing exercise as tolerated. Iron Deficiency-hemoglobin is 11, ferritin 32,Iron sat 21% with a normal TIBC.  This is stable for her.  Hold any IV iron for now. 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 Marcelino.  No indication for starting Procrit.  Disposition:  Nov mammogram 6 mo- lab (cbc, ferritin, iron studies), Dr Melanee- [place]   Visit Diagnosis 1. Encounter for follow-up surveillance of breast cancer   2. Encounter for monitoring aromatase inhibitor therapy   3. Anemia in stage 3b chronic kidney disease (HCC)    Tinnie Dawn, DNP, AGNP-C, AOCNP [facility] [phone] (clinic) 10/19/2023

## 2023-11-02 DIAGNOSIS — M2041 Other hammer toe(s) (acquired), right foot: Secondary | ICD-10-CM | POA: Diagnosis not present

## 2023-11-02 DIAGNOSIS — M21611 Bunion of right foot: Secondary | ICD-10-CM | POA: Diagnosis not present

## 2023-11-02 DIAGNOSIS — M2011 Hallux valgus (acquired), right foot: Secondary | ICD-10-CM | POA: Diagnosis not present

## 2023-11-04 DIAGNOSIS — M51362 Other intervertebral disc degeneration, lumbar region with discogenic back pain and lower extremity pain: Secondary | ICD-10-CM | POA: Diagnosis not present

## 2023-11-04 DIAGNOSIS — M1711 Unilateral primary osteoarthritis, right knee: Secondary | ICD-10-CM | POA: Diagnosis not present

## 2023-11-05 ENCOUNTER — Ambulatory Visit (INDEPENDENT_AMBULATORY_CARE_PROVIDER_SITE_OTHER): Admitting: Internal Medicine

## 2023-11-05 ENCOUNTER — Encounter: Payer: Self-pay | Admitting: Internal Medicine

## 2023-11-05 VITALS — BP 120/78 | HR 69 | Ht 63.0 in | Wt 175.0 lb

## 2023-11-05 DIAGNOSIS — F411 Generalized anxiety disorder: Secondary | ICD-10-CM | POA: Diagnosis not present

## 2023-11-05 DIAGNOSIS — Z170 Estrogen receptor positive status [ER+]: Secondary | ICD-10-CM

## 2023-11-05 DIAGNOSIS — I10 Essential (primary) hypertension: Secondary | ICD-10-CM

## 2023-11-05 DIAGNOSIS — L97511 Non-pressure chronic ulcer of other part of right foot limited to breakdown of skin: Secondary | ICD-10-CM

## 2023-11-05 DIAGNOSIS — E782 Mixed hyperlipidemia: Secondary | ICD-10-CM | POA: Diagnosis not present

## 2023-11-05 DIAGNOSIS — N1832 Chronic kidney disease, stage 3b: Secondary | ICD-10-CM

## 2023-11-05 DIAGNOSIS — K219 Gastro-esophageal reflux disease without esophagitis: Secondary | ICD-10-CM | POA: Diagnosis not present

## 2023-11-05 DIAGNOSIS — E039 Hypothyroidism, unspecified: Secondary | ICD-10-CM

## 2023-11-05 DIAGNOSIS — M150 Primary generalized (osteo)arthritis: Secondary | ICD-10-CM | POA: Insufficient documentation

## 2023-11-05 DIAGNOSIS — Z1231 Encounter for screening mammogram for malignant neoplasm of breast: Secondary | ICD-10-CM

## 2023-11-05 DIAGNOSIS — M810 Age-related osteoporosis without current pathological fracture: Secondary | ICD-10-CM

## 2023-11-05 DIAGNOSIS — Z0000 Encounter for general adult medical examination without abnormal findings: Secondary | ICD-10-CM

## 2023-11-05 DIAGNOSIS — H109 Unspecified conjunctivitis: Secondary | ICD-10-CM

## 2023-11-05 MED ORDER — TOBRAMYCIN-DEXAMETHASONE 0.3-0.1 % OP SUSP: 2.0000 [drp] | Freq: Four times a day (QID) | OPHTHALMIC | 0 refills | Status: DC

## 2023-11-05 MED ORDER — ATORVASTATIN CALCIUM 40 MG PO TABS: 40.0000 mg | ORAL_TABLET | Freq: Every day | ORAL | 1 refills | Status: DC

## 2023-11-05 MED ORDER — OMEPRAZOLE 40 MG PO CPDR: 40.0000 mg | DELAYED_RELEASE_CAPSULE | Freq: Every day | ORAL | 1 refills | Status: DC

## 2023-11-05 NOTE — Assessment & Plan Note (Signed)
 On chlorthalidone  and irbesartan . BP in range today. Continue same regimen.

## 2023-11-05 NOTE — Assessment & Plan Note (Signed)
 Reflux symptoms are controlled on omeprazole. Patient denies red flag symptoms - no melena, weight loss, dysphagia.

## 2023-11-05 NOTE — Assessment & Plan Note (Signed)
 Recent hammertoe and bunion surgery Now small skin breakdown on second toe Appears very shallow without infection today She will continue to monitor and avoid friction

## 2023-11-05 NOTE — Assessment & Plan Note (Signed)
 She continues to see Oncology and take Arimidex .

## 2023-11-05 NOTE — Assessment & Plan Note (Signed)
 Followed by Nephrology. Last GFR 32

## 2023-11-05 NOTE — Progress Notes (Signed)
 Date:  11/05/2023   Name:  Latoya Stevenson   DOB:  [birth year]   MRN:  [id number]   Chief Complaint: Annual Exam Latoya Stevenson is a 83 y.o. female who presents today for her Complete Annual Exam. She feels fairly well. She reports exercising - walking. She reports she is sleeping well. Breast complaints - none.   Health Maintenance  Topic Date Due   COVID-19 Vaccine (4 - 2024-25 season) 11/16/2022   Zoster (Shingles) Vaccine (1 of 2) 02/05/2024*   Flu Shot  06/14/2024*   Medicare Annual Wellness Visit  01/07/2024   DTaP/Tdap/Td vaccine (2 - Td or Tdap) 08/08/2024   Pneumococcal Vaccine for age over 28  Completed   DEXA scan (bone density measurement)  Completed   HPV Vaccine  Aged Out   Meningitis B Vaccine  Aged Out   Hepatitis C Screening  Discontinued  *Topic was postponed. The date shown is not the original due date.     Hypertension This is a chronic problem. The problem is controlled. Associated symptoms include anxiety. Pertinent negatives include no chest pain, headaches, palpitations or shortness of breath. Past treatments include angiotensin blockers and diuretics. Hypertensive end-organ damage includes kidney disease.  Gastroesophageal Reflux She complains of heartburn. She reports no abdominal pain, no chest pain, no coughing or no wheezing. This is a recurrent problem. The problem occurs occasionally. Pertinent negatives include no fatigue. She has tried a PPI for the symptoms.  Hyperlipidemia This is a chronic problem. The problem is controlled. Pertinent negatives include no chest pain, myalgias or shortness of breath. Current antihyperlipidemic treatment includes statins.  Anxiety Presents for follow-up visit. Patient reports no chest pain, dizziness, nervous/anxious behavior, palpitations or shortness of breath.    CKD - followed by Nephrology. Attributed to age. Osteoarthritis - multiple sites.  Seen yesterday by Rheumatology and recommended cortisone knee  injections.  She will schedule.    Review of Systems  Constitutional:  Negative for fatigue and unexpected weight change.  HENT:  Negative for trouble swallowing.   Eyes:  Positive for discharge and itching. Negative for visual disturbance.  Respiratory:  Negative for cough, chest tightness, shortness of breath and wheezing.   Cardiovascular:  Negative for chest pain, palpitations and leg swelling.  Gastrointestinal:  Positive for heartburn. Negative for abdominal pain, constipation and diarrhea.  Musculoskeletal:  Positive for arthralgias and gait problem. Negative for myalgias.  Neurological:  Negative for dizziness, weakness, light-headedness and headaches.  Psychiatric/Behavioral:  Negative for dysphoric mood and sleep disturbance. The patient is not nervous/anxious.      Lab Results  Component Value Date   NA 141 10/15/2023   K 3.8 10/15/2023   CO2 24 (A) 10/15/2023   GLUCOSE 96 10/09/2022   BUN 28 (A) 10/15/2023   CREATININE 1.6 (A) 10/15/2023   CALCIUM  9.2 10/15/2023   EGFR 32 10/15/2023   GFRNONAA 39 (L) 10/24/2021   Lab Results  Component Value Date   CHOL 122 10/09/2022   HDL 36 (L) 10/09/2022   LDLCALC 68 10/09/2022   TRIG 94 10/09/2022   CHOLHDL 3.4 10/09/2022   Lab Results  Component Value Date   TSH 0.11 (A) 05/05/2023   No results found for: HGBA1C Lab Results  Component Value Date   WBC 4.5 10/19/2023   HGB 11.0 (L) 10/19/2023   HCT 35.5 (L) 10/19/2023   MCV 91.3 10/19/2023   PLT 165 10/19/2023   Lab Results  Component Value Date   ALT 18 10/09/2022  AST 19 10/09/2022   ALKPHOS 74 10/09/2022   BILITOT 0.7 10/09/2022   Lab Results  Component Value Date   VD25OH 58.0 10/09/2022     Patient Active Problem List   Diagnosis Date Noted   Primary osteoarthritis involving multiple joints 11/05/2023   Toe ulcer, right, limited to breakdown of skin (HCC) 06/26/2023   Hallux valgus of right foot 06/26/2023   Callous ulcer, limited to breakdown  of skin (HCC) 04/22/2023   Plantar fasciitis of right foot 03/26/2022   Generalized anxiety disorder 11/04/2021   Left leg cellulitis 10/09/2021   History of breast cancer 10/09/2021   Lumbar radiculopathy 10/09/2021   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) 08/04/2019   Esophageal stenosis 01/28/2018   Varicose veins of left leg with edema 07/29/2017   Dysphagia, oropharyngeal 04/30/2017   Lumbar degenerative disc disease 04/30/2017   Spinal stenosis of lumbar region 12/08/2016   Idiopathic scoliosis of lumbar region 09/09/2016   Chronic midline low back pain with right-sided sciatica 07/28/2016   GERD without esophagitis 07/27/2015   Intermittent vertigo 07/27/2015   Osteoporosis, senile 07/27/2015   Seasonal allergic rhinitis due to pollen 07/27/2015   Acquired hypothyroidism 01/05/2015   Benign essential hypertension 06/29/2014   Mixed hyperlipidemia 06/29/2014   Stage 3b chronic kidney disease (HCC) 06/29/2014   Insomnia 11/11/2013   Mild reactive airways disease 11/11/2013    Allergies  Allergen Reactions   Flagyl [Metronidazole Hcl] Swelling    Swelling in face, mouth, throat, palms of feet and hands.   Furosemide Itching    Made side of mouth irritated and puffy.  The rim inside her lip swelled up   Macrodantin [Nitrofurantoin Macrocrystal] Anaphylaxis, Hives and Swelling   Penicillins Anaphylaxis and Swelling    PATIENT HAD A PCN REACTION WITH IMMEDIATE RASH, FACIAL/TONGUE/THROAT SWELLING, SOB, OR LIGHTHEADEDNESS WITH HYPOTENSION:  #  #  #  YES  #  #  #   Has patient had a PCN reaction causing severe rash involving mucus membranes or skin necrosis: No Has patient had a PCN reaction that required hospitalization: No Has patient had a PCN reaction occurring within the last 10 years: No If all of the above answers are NO, then may proceed with Cephalosporin use.    Celebrex [Celecoxib] Swelling    Swelling in joints.  Did not agree with  her. Did not help her either   Other     Pt says it was recently discovered she allergic to an ingredient in local anesthesia but she can't recall the name. (epinephrine  and the additive used for the biopsy. Gets very nervous. Feels internally shaking. 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 Darra MO FRACTURE SURGERY Left 2003   arch of foot.  no metal. per patient, she did not have surgery for this fracture. healed on its own   ANTERIOR LAT LUMBAR FUSION Left 09/09/2016   Procedure: Left Lumbar two-three Anterolateral lumbar interbody fusion with lateral plate;  Surgeon: Unice Pac, MD;  Location: [facility] OR;  Service: Neurosurgery;  Laterality: Left;  Left L2-3 Anterolateral lumbar interbody fusion with lateral plate   APPENDECTOMY     BACK SURGERY  2013, 2016, 2018   lumbar lam, fusion, discectomy.titanium   BIOPSY THYROID      x2  thyroid  nodule ; Dr. Rollene and one by  Dr. Cleotilde   BREAST BIOPSY Left 08/02/2019   coil clip-INVASIVE MAMMARY CARCINOMA   BREAST CYST ASPIRATION  1970's   BREAST LUMPECTOMY Left 2021   BREAST LUMPECTOMY WITH RADIOFREQUENCY TAG IDENTIFICATION Left 08/12/2019   Procedure: BREAST LUMPECTOMY WITH RADIOFREQUENCY TAG IDENTIFICATION;  Surgeon: Lane Shope, MD;  Location: [facility] ORS;  Service: General;  Laterality: Left;   BREAST LUMPECTOMY WITH SENTINEL LYMPH NODE BIOPSY Left 08/12/2019   Procedure: BREAST LUMPECTOMY WITH SENTINEL LYMPH NODE BX;  Surgeon: Lane Shope, MD;  Location: [facility] ORS;  Service: General;  Laterality: Left;   broken arm Left 1994   wrist fracture surgery, left.  metal has been replaced.   CARDIOVASCULAR STRESS TEST     2010   COLONOSCOPY  2010   COLONOSCOPY  07/21/2013   PHX OF CP/REPEAT 75YRS/OH   ESOPHAGOGASTRODUODENOSCOPY (EGD) WITH PROPOFOL  N/A 09/22/2018   Procedure: ESOPHAGOGASTRODUODENOSCOPY (EGD) WITH  PROPOFOL ;  Surgeon: Toledo, Ladell POUR, MD;  Location: [facility] ENDOSCOPY;  Service: Gastroenterology;  Laterality: N/A;  Patient to have Rapid COVID-19 day of procedure Nan approved this)   EYE SURGERY Bilateral    cataract surgery with lens implant   FOREIGN BODY REMOVAL N/A 12/09/2019   Procedure: FOREIGN BODY REMOVAL ADULT;  Surgeon: Kathlynn Sharper, MD;  Location: [facility] ORS;  Service: Orthopedics;  Laterality: N/A;   INCISION AND DRAINAGE Left 12/09/2019   Procedure: INCISION AND DRAINAGE;  Surgeon: Kathlynn Sharper, MD;  Location: [facility] ORS;  Service: Orthopedics;  Laterality: Left;   LACERATION REPAIR Bilateral 12/09/2019   Procedure: REPAIR MULTIPLE LACERATIONS;  Surgeon: Kathlynn Sharper, MD;  Location: [facility] ORS;  Service: Orthopedics;  Laterality: Bilateral;   LUMBAR LAMINECTOMY/DECOMPRESSION MICRODISCECTOMY  11/18/2011   Procedure: LUMBAR LAMINECTOMY/DECOMPRESSION MICRODISCECTOMY 2 LEVELS;  Surgeon: Fairy Levels, MD;  Location: [facility];  Service: Neurosurgery;  Laterality: N/A;  Lumbar Three-Four,  Lumbar Four-Five Decompressive Laminectomy   MALONEY DILATION N/A 09/22/2018   Procedure: MALONEY DILATION;  Surgeon: Toledo, Ladell POUR, MD;  Location: [facility];  Service: Gastroenterology;  Laterality: N/A;   OVARIAN CYST SURGERY     POSTERIOR FUSION LUMBAR SPINE  2016   TOTAL THYROIDECTOMY     TRANSTHORACIC ECHOCARDIOGRAM  2010   upper and lower GI  2010    Social History   Tobacco Use   Smoking status: Never    Passive exposure: Never   Smokeless tobacco: Never  Vaping Use   Vaping status: Never Used  Substance Use Topics   Alcohol use: Not Currently    Comment: socially-rare   Drug use: No     Medication list has been reviewed and updated.  Current Meds  Medication Sig   acetaminophen  (TYLENOL ) 650 MG CR tablet Take 1,300 mg by mouth every 8 (eight) hours as needed for pain.   anastrozole  (ARIMIDEX ) 1 MG tablet Take 1 tablet (1 mg total) by mouth daily.   Ascorbic Acid  (VITAMIN C) 1000 MG tablet Take 1,000 mg by mouth daily.   Calcium  Carbonate-Vitamin D  600-400 MG-UNIT tablet Take 2 tablets by mouth daily.   chlorthalidone  (HYGROTON ) 50 MG tablet Take 1 tablet by mouth 2 (two) times daily.   cholecalciferol  (VITAMIN D3) 25 MCG (1000 UNIT) tablet Take 1,000 Units by mouth daily.   denosumab  (PROLIA ) 60 MG/ML SOSY injection Inject 60 mg into the skin every 6 (six) months.   diclofenac  Sodium (VOLTAREN ) 1 % GEL Apply 2 g topically 4 (four) times daily.   doxylamine , Sleep, (UNISOM ) 25 MG tablet Take 25 mg by mouth.  fexofenadine (ALLEGRA) 180 MG tablet Take 180 mg by mouth daily.   fluticasone  (FLONASE ) 50 MCG/ACT nasal spray USE 2 SPRAYS IN EACH NOSTRIL EVERY DAY AS NEEDED FOR ALLERGIES   gabapentin  (NEURONTIN ) 300 MG capsule Take 600 mg by mouth 3 (three) times daily.   GLUCOSAMINE-CHONDROITIN PO Take 1 tablet by mouth daily.    irbesartan  (AVAPRO ) 300 MG tablet TAKE 1 TABLET EVERY DAY   levothyroxine  (SYNTHROID ) 112 MCG tablet Take 112 mcg by mouth daily.   Multiple Vitamin (MULTIVITAMIN WITH MINERALS) TABS Take 1 tablet by mouth daily.   sertraline  (ZOLOFT ) 50 MG tablet TAKE 1 TABLET EVERY DAY   vitamin E  180 MG (400 UNITS) capsule Take 400 Units by mouth daily.   [DISCONTINUED] atorvastatin  (LIPITOR) 40 MG tablet TAKE 1 TABLET AT BEDTIME   [DISCONTINUED] omeprazole  (PRILOSEC) 40 MG capsule TAKE 1 CAPSULE EVERY DAY BEFORE BREAKFAST       11/05/2023    9:20 AM 04/22/2023   10:00 AM 10/09/2022    8:40 AM 07/30/2022    3:05 PM  GAD 7 : Generalized Anxiety Score  Nervous, Anxious, on Edge 0 0 0 1  Control/stop worrying 0 0 0 1  Worry too much - different things 0 0 0 1  Trouble relaxing 0 0 0 0  Restless 0 0 0 0  Easily annoyed or irritable 0 0 0 1  Afraid - awful might happen 0 0 0 0  Total GAD 7 Score 0 0 0 4  Anxiety Difficulty Not difficult at all Not difficult at all Not difficult at all Not difficult at all       11/05/2023    9:20 AM  10/19/2023   12:54 PM 04/22/2023   10:00 AM  Depression screen PHQ 2/9  Decreased Interest 0 0 0  Down, Depressed, Hopeless 0 0 0  PHQ - 2 Score 0 0 0  Altered sleeping 0  1  Tired, decreased energy 0  3  Change in appetite 0  0  Feeling bad or failure about yourself  0  0  Trouble concentrating 0  0  Moving slowly or fidgety/restless 0  0  Suicidal thoughts 0  0  PHQ-9 Score 0  4  Difficult doing work/chores Not difficult at all  Not difficult at all    BP Readings from Last 3 Encounters:  11/05/23 120/78  10/19/23 139/75  06/25/23 128/78    Physical Exam Vitals and nursing note reviewed.  Constitutional:      General: She is not in acute distress.    Appearance: She is well-developed.  HENT:     Head: Normocephalic and atraumatic.     Right Ear: Tympanic membrane and ear canal normal.     Left Ear: Tympanic membrane and ear canal normal.     Nose:     Right Sinus: No maxillary sinus tenderness.     Left Sinus: No maxillary sinus tenderness.  Eyes:     General: No scleral icterus.       Right eye: No discharge.        Left eye: No discharge.     Conjunctiva/sclera: Conjunctivae normal.     Comments: Increased tears  Neck:     Thyroid : No thyromegaly.     Vascular: No carotid bruit.  Cardiovascular:     Rate and Rhythm: Normal rate and regular rhythm.     Pulses: Normal pulses.     Heart sounds: Normal heart sounds.  Pulmonary:  Effort: Pulmonary effort is normal. No respiratory distress.     Breath sounds: No wheezing.  Abdominal:     General: Bowel sounds are normal.     Palpations: Abdomen is soft.     Tenderness: There is no abdominal tenderness.  Musculoskeletal:     Cervical back: Normal range of motion. No erythema.     Right lower leg: No edema.     Left lower leg: No edema.  Lymphadenopathy:     Cervical: No cervical adenopathy.  Skin:    General: Skin is warm and dry.     Capillary Refill: Capillary refill takes less than 2 seconds.      Findings: No rash.  Neurological:     General: No focal deficit present.     Mental Status: She is alert and oriented to person, place, and time.     Cranial Nerves: No cranial nerve deficit.     Sensory: No sensory deficit.     Deep Tendon Reflexes: Reflexes are normal and symmetric.  Psychiatric:        Attention and Perception: Attention normal.        Mood and Affect: Mood normal.     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       Acquired hypothyroidism (Chronic)   Supplemented. Lab Results  Component Value Date   TSH 0.11 (A) 05/05/2023  Managed by Endo       Relevant Medications   levothyroxine  (SYNTHROID ) 112 MCG tablet   Benign essential hypertension (Chronic)   On chlorthalidone  and irbesartan . BP in range today. Continue same regimen.      Relevant Medications   atorvastatin  (LIPITOR) 40 MG tablet   GERD without esophagitis   Reflux symptoms are controlled on omeprazole . Patient denies red flag symptoms - no melena, weight loss, dysphagia.       Relevant Medications   omeprazole  (PRILOSEC) 40 MG capsule   Osteoporosis, senile (Chronic)   On Prolia  injections from Endo.  She pays out of pocket $1020 every 6 months DEXA scheduled at [facility] next month.      Relevant Orders   AMB Referral [facility] Care Management   Mixed hyperlipidemia (Chronic)   LDL is  Lab Results  Component Value Date   LDLCALC 68 10/09/2022   Currently taking atorvastatin    No medication side effects or other concerns. Recommended LDL goal is < 70.       Relevant Medications    atorvastatin  (LIPITOR) 40 MG tablet   Other Relevant Orders   Hepatic function panel   Lipid panel   Stage 3b chronic kidney disease (HCC) (Chronic)   Followed by Nephrology. Last GFR 32      Generalized anxiety disorder (Chronic)   Doing well on Sertraline  but only taking 25 mg daily. No side effects or other concerns.      Toe ulcer, right, limited to breakdown of skin (HCC)   Recent hammertoe and bunion surgery Now  small skin breakdown on second toe Appears very shallow without infection today She will continue to monitor and avoid friction      Primary osteoarthritis involving multiple joints   Other Visit Diagnoses       Annual physical exam    -  Primary     Encounter for screening mammogram for breast cancer       schedule at [facility]     Conjunctivitis of both eyes, unspecified conjunctivitis type       Relevant Medications   tobramycin -dexamethasone  (TOBRADEX ) ophthalmic solution       Return in about 6 months (around 05/07/2024) for HTN - Dr. Lemon.    Leita HILARIO Adie, MD [facility] [place]

## 2023-11-05 NOTE — Assessment & Plan Note (Signed)
 LDL is  Lab Results  Component Value Date   LDLCALC 68 10/09/2022   Currently taking atorvastatin    No medication side effects or other concerns. Recommended LDL goal is < 70.

## 2023-11-05 NOTE — Assessment & Plan Note (Addendum)
 Doing well on Sertraline  but only taking 25 mg daily. No side effects or other concerns.

## 2023-11-05 NOTE — Assessment & Plan Note (Addendum)
 On Prolia  injections from Endo.  She pays out of pocket $1020 every 6 months DEXA scheduled at [facility] next month.

## 2023-11-05 NOTE — Assessment & Plan Note (Addendum)
 Supplemented. Lab Results  Component Value Date   TSH 0.11 (A) 05/05/2023  Managed by Endo

## 2023-11-06 ENCOUNTER — Ambulatory Visit: Payer: Self-pay | Admitting: Internal Medicine

## 2023-11-06 LAB — LIPID PANEL
Chol/HDL Ratio: 3.1 ratio (ref 0.0–4.4)
Cholesterol, Total: 130 mg/dL (ref 100–199)
HDL: 42 mg/dL (ref >39)
LDL Chol Calc (NIH): 68 mg/dL (ref 0–99)
Triglycerides: 109 mg/dL (ref 0–149)
VLDL Cholesterol Cal: 20 mg/dL (ref 5–40)

## 2023-11-06 LAB — HEPATIC FUNCTION PANEL
ALT: 16 IU/L (ref 0–32)
AST: 23 IU/L (ref 0–40)
Albumin: 4.2 g/dL (ref 3.7–4.7)
Alkaline Phosphatase: 87 IU/L (ref 44–121)
Bilirubin Total: 0.5 mg/dL (ref 0.0–1.2)
Bilirubin, Direct: 0.22 mg/dL (ref 0.00–0.40)
Total Protein: 6.5 g/dL (ref 6.0–8.5)

## 2023-11-11 ENCOUNTER — Other Ambulatory Visit: Payer: Self-pay | Admitting: Internal Medicine

## 2023-11-11 ENCOUNTER — Telehealth: Payer: Self-pay

## 2023-11-11 DIAGNOSIS — F411 Generalized anxiety disorder: Secondary | ICD-10-CM

## 2023-11-11 NOTE — Progress Notes (Signed)
 Care Guide Pharmacy Note  11/11/2023 Name: Latoya Stevenson MRN: [id number] DOB: 21-May-1940  Referred By: Justus Leita DEL, MD Reason for referral: Complex Care Management (Outreach to schedule with Pharm d )   Latoya Stevenson is a 83 y.o. year old female who is a primary care patient of Justus Leita DEL, MD.  Latoya Stevenson was referred to the pharmacist for assistance related to: osteoporosis  Successful contact was made with the patient to discuss pharmacy services including being ready for the pharmacist to call at least 5 minutes before the scheduled appointment time and to have medication bottles and any blood pressure readings ready for review. The patient agreed to meet with the pharmacist via telephone visit on (date/time).11/18/2023  Jeoffrey Buffalo , RMA     [place]  [facility] Guide  Direct Dial: [phone]  Website: [email or web address]

## 2023-11-13 NOTE — Telephone Encounter (Signed)
 Requested Prescriptions  Pending Prescriptions Disp Refills   sertraline  (ZOLOFT ) 50 MG tablet [Pharmacy Med Name: SERTRALINE  HCL 50 MG Oral Tablet] 90 tablet 1    Sig: TAKE 1 TABLET EVERY DAY     Psychiatry:  Antidepressants - SSRI - sertraline  Passed - 11/13/2023 10:22 AM      Passed - AST in normal range and within 360 days    AST  Date Value Ref Range Status  11/05/2023 23 0 - 40 IU/L Final         Passed - ALT in normal range and within 360 days    ALT  Date Value Ref Range Status  11/05/2023 16 0 - 32 IU/L Final         Passed - Completed PHQ-2 or PHQ-9 in the last 360 days      Passed - Valid encounter within last 6 months    Recent Outpatient Visits           1 week ago Annual physical exam   [facility] at [facility], Leita DEL, MD   4 months ago Left leg cellulitis   [place] [facility] at [facility], Selinda PARAS, MD   6 months ago Benign essential hypertension   [facility] at [facility], Leita DEL, MD

## 2023-11-18 ENCOUNTER — Other Ambulatory Visit: Payer: Self-pay | Admitting: Pharmacist

## 2023-11-18 DIAGNOSIS — M810 Age-related osteoporosis without current pathological fracture: Secondary | ICD-10-CM

## 2023-11-18 NOTE — Progress Notes (Signed)
 11/18/2023 Name: Latoya Stevenson MRN: [id number] DOB: 1941/02/25  Chief Complaint  Patient presents with   Medication Assistance    Latoya Stevenson is a 83 y.o. year old female who presented for a telephone visit.   They were referred to the pharmacist by their PCP for assistance in managing medication access.    Subjective:  Care Team: Primary Care Provider: Justus Leita DEL, MD/Liang, Harlene, MD; Next Scheduled Visit: 05/09/2024 Endocrinologist Solum, Therisa Setter, MD; Next Scheduled Visit: 12/15/2023 Oncologist: Latoya Annah BROCKS, MD Nephrologist: Lateef, Munsoor, MD ; Next Scheduled Visit:  Cardiologist: Latoya Cara Endow, MD; Next Scheduled Visit: 12/15/2023 Rheumatologist: Latoya Lady Plumb, MD  Medication Access/Adherence  Current Pharmacy:  [facility] [address] [place] - [address] [place] [address] Phone: [phone] Fax: [phone]  [facility] - [place], [place] - [address] [place] [address] Phone: [phone] Fax: [phone]   Patient reports affordability concerns with their medications: Yes  Patient reports access/transportation concerns to their pharmacy: No  Patient reports adherence concerns with their medications:  No    Reports cost of Prolia  is difficult to afford through her [facility] prescription coverage. Reports latest cost was >$1000 for 1 dose. Reports previously discussed with prescriber, Latoya Stevenson. From review of office visit note from 08/03/2023, Endocrinologist advised Alternative less costly options would be bisphosphonates, which unfortunately are contraindicated in setting of her advanced CKD. PTH analogs and Evenity would likely be even more costly than Prolia  - Patient receiving Prolia  60 mg every 6 months (last dose on 08/18/2023)   Objective:   Lab Results  Component Value Date   CREATININE 1.6 (A) 10/15/2023   BUN 28 (A) 10/15/2023    NA 141 10/15/2023   K 3.8 10/15/2023   CL 107 10/15/2023   CO2 24 (A) 10/15/2023    Lab Results  Component Value Date   CHOL 130 11/05/2023   HDL 42 11/05/2023   LDLCALC 68 11/05/2023   TRIG 109 11/05/2023   CHOLHDL 3.1 11/05/2023    Current Outpatient Medications on File Prior to Visit  Medication Sig Dispense Refill   acetaminophen  (TYLENOL ) 650 MG CR tablet Take 1,300 mg by mouth every 8 (eight) hours as needed for pain.     anastrozole  (ARIMIDEX ) 1 MG tablet Take 1 tablet (1 mg total) by mouth daily. 90 tablet 0   Ascorbic Acid (VITAMIN C) 1000 MG tablet Take 1,000 mg by mouth daily.     atorvastatin  (LIPITOR) 40 MG tablet Take 1 tablet (40 mg total) by mouth at bedtime. 90 tablet 1   Calcium  Carbonate-Vitamin D  600-400 MG-UNIT tablet Take 2 tablets by mouth daily.     chlorthalidone  (HYGROTON ) 50 MG tablet Take 1 tablet by mouth 2 (two) times daily.     cholecalciferol  (VITAMIN D3) 25 MCG (1000 UNIT) tablet Take 1,000 Units by mouth daily.     denosumab  (PROLIA ) 60 MG/ML SOSY injection Inject 60 mg into the skin every 6 (six) months.     diclofenac  Sodium (VOLTAREN ) 1 % GEL Apply 2 g topically 4 (four) times daily.     doxylamine , Sleep, (UNISOM ) 25 MG tablet Take 25 mg by mouth.     fexofenadine (ALLEGRA) 180 MG tablet Take 180 mg by mouth daily.     fluticasone  (FLONASE ) 50 MCG/ACT nasal spray USE 2 SPRAYS IN EACH NOSTRIL EVERY DAY AS NEEDED FOR ALLERGIES 16 g 3   gabapentin  (  NEURONTIN ) 300 MG capsule Take 600 mg by mouth 3 (three) times daily.     GLUCOSAMINE-CHONDROITIN PO Take 1 tablet by mouth daily.      irbesartan  (AVAPRO ) 300 MG tablet TAKE 1 TABLET EVERY DAY 90 tablet 0   levothyroxine  (SYNTHROID ) 112 MCG tablet Take 112 mcg by mouth daily.     Multiple Vitamin (MULTIVITAMIN WITH MINERALS) TABS Take 1 tablet by mouth daily.     omeprazole  (PRILOSEC) 40 MG capsule Take 1 capsule (40 mg total) by mouth daily. 90 capsule 1   sertraline  (ZOLOFT ) 50 MG tablet TAKE 1  TABLET EVERY DAY 90 tablet 1   tobramycin -dexamethasone  (TOBRADEX ) ophthalmic solution Place 2 drops into both eyes every 6 (six) hours. 5 mL 0   vitamin E  180 MG (400 UNITS) capsule Take 400 Units by mouth daily.     No current facility-administered medications on file prior to visit.       Assessment/Plan:   From review of details for patient's current prescription plan from [facility] website, note Prolia  is covered as a tier 4 medication. Note for the plan, patient has a $250 deductible and pays 45% coinsurance for tier 4 medications  Outreach to [facility] today to collaborate regarding medication access options for patient's Prolia . Leave message with Wonda in office requesting a call back.  Provide patient with contact information for the Medicare and Seniors' Health Insurance Information Program [facility]) as requests help with resource for selecting her prescription insurance plan for next calendar year  Based on reported income, patient does not meet criteria for Extra Help subsidy  Patient meets financial criteria for Prolia  patient assistance program through [employer]. Will collaborate with provider, CPhT, and patient to pursue assistance.     Follow Up Plan: Clinical Pharmacist will follow up with patient by telephone within the next 7 days  Latoya Stevenson, PharmD, [facility] [phone]

## 2023-11-25 ENCOUNTER — Other Ambulatory Visit (HOSPITAL_COMMUNITY): Payer: Self-pay

## 2023-11-25 ENCOUNTER — Telehealth: Payer: Self-pay | Admitting: Internal Medicine

## 2023-11-25 ENCOUNTER — Other Ambulatory Visit: Payer: Self-pay | Admitting: Pharmacist

## 2023-11-25 DIAGNOSIS — M810 Age-related osteoporosis without current pathological fracture: Secondary | ICD-10-CM

## 2023-11-25 NOTE — Patient Instructions (Addendum)
 Please follow up with Nat at [facility] regarding the cost of your Prolia .  [facility] [address] [place] [address] [phone]   Nat has shared that the cost of your Prolia  would be lower with getting this through your medical benefit (20% of cost), compared to getting it through your pharmacy.  However, as we discussed, I have also sent Nat the form to try the tier exception request through your [facility] Prescription plan to see about lowering your cost through your pharmacy benefit.  Thank you!  Sharyle Sia, PharmD, [facility] [phone]

## 2023-11-25 NOTE — Progress Notes (Signed)
   11/25/2023  Patient ID: Latoya Stevenson, female   DOB: February 27, 1941, 83 y.o.   MRN: [id number]   Receive message from CPhT [place] advising that per [employer], patient does not meet criteria for patient assistance for Prolia  as the medication is covered through her prescription coverage.  Outreach again to [facility] today to collaborate regarding medication access options for patient's Prolia . Speak with Nat with [facility]. Nat advises that per benefits investigation for patient, she could receive Prolia  at lower cost if injection was billed through her Part B benefit at office, rather than through her Part D benefit from pharmacy as patient's copayment through Part B would be 20% ($330).   Also ask Nat to submit tier exception request for patient's Prolia  through her [facility] plan as if Prolia  were to be billed at tier 3 level, rather than tier 4, her tier 3 copayment is $47. Note patient has contraindication to preferred formulary alternatives, bisphosphonates therapies, due to her advance CKD. Send Nat the [facility] Tier Exception form via secure email as requested.  Follow up with patient to provide an update.   Patient denies further medication questions or concerns today Provide patient with contact information for clinic pharmacist to contact if needed in future for medication questions/concerns   Sharyle Sia, PharmD, [facility] [phone]

## 2023-11-25 NOTE — Telephone Encounter (Signed)
 Please review.  KP

## 2023-11-25 NOTE — Telephone Encounter (Signed)
 Copied from CRM [id number]. Topic: General - Call Back - No Documentation >> Nov 25, 2023 10:00 AM Wess RAMAN wrote: Reason for CRM: Patient missed call and would like a call back.  Callback #: [phone]

## 2023-11-25 NOTE — Patient Instructions (Signed)
 Goals Addressed             This Visit's Progress    Pharmacy Goals       You can find more information about the Medicare and Seniors' [employer] Information Program [facility]) here:  [email or web address]  You can reach the [facility] Counselor for [facility] by calling:  Norleen Glenn [facility] [address]     [place]  [address] [phone]     Ask for help with [facility]  Thank you!  Sharyle Sia, PharmD, [facility] [phone]

## 2023-11-27 ENCOUNTER — Telehealth: Payer: Self-pay | Admitting: *Deleted

## 2023-11-27 ENCOUNTER — Other Ambulatory Visit: Payer: Self-pay | Admitting: *Deleted

## 2023-11-27 MED ORDER — ANASTROZOLE 1 MG PO TABS: 1.0000 mg | ORAL_TABLET | Freq: Every day | ORAL | 0 refills | Status: DC

## 2023-11-29 ENCOUNTER — Other Ambulatory Visit: Payer: Self-pay | Admitting: Internal Medicine

## 2023-12-01 DIAGNOSIS — N1832 Chronic kidney disease, stage 3b: Secondary | ICD-10-CM | POA: Insufficient documentation

## 2023-12-01 NOTE — Telephone Encounter (Signed)
 Requested Prescriptions  Pending Prescriptions Disp Refills   irbesartan  (AVAPRO ) 300 MG tablet [Pharmacy Med Name: IRBESARTAN  300 MG Oral Tablet] 90 tablet 0    Sig: TAKE 1 TABLET EVERY DAY     Cardiovascular:  Angiotensin Receptor Blockers Failed - 12/01/2023 11:30 AM      Failed - Cr in normal range and within 180 days    Creatinine  Date Value Ref Range Status  10/15/2023 1.6 (A) 0.5 - 1.1 Final   Creatinine, Ser  Date Value Ref Range Status  10/09/2022 1.42 (H) 0.57 - 1.00 mg/dL Final         Passed - K in normal range and within 180 days    Potassium  Date Value Ref Range Status  10/15/2023 3.8 3.5 - 5.1 mEq/L Final         Passed - Patient is not pregnant      Passed - Last BP in normal range    BP Readings from Last 1 Encounters:  11/05/23 120/78         Passed - Valid encounter within last 6 months    Recent Outpatient Visits           3 weeks ago Annual physical exam   [facility], Leita DEL, MD   5 months ago Left leg cellulitis   [facility] at [facility] Alvia, Selinda PARAS, MD   7 months ago Benign essential hypertension   [facility] at [facility], Leita DEL, MD

## 2023-12-07 ENCOUNTER — Other Ambulatory Visit: Payer: Self-pay

## 2023-12-07 DIAGNOSIS — Z1231 Encounter for screening mammogram for malignant neoplasm of breast: Secondary | ICD-10-CM

## 2023-12-08 DIAGNOSIS — M8588 Other specified disorders of bone density and structure, other site: Secondary | ICD-10-CM | POA: Diagnosis not present

## 2023-12-08 DIAGNOSIS — E890 Postprocedural hypothyroidism: Secondary | ICD-10-CM | POA: Diagnosis not present

## 2023-12-08 DIAGNOSIS — M810 Age-related osteoporosis without current pathological fracture: Secondary | ICD-10-CM | POA: Diagnosis not present

## 2023-12-08 DIAGNOSIS — N1832 Chronic kidney disease, stage 3b: Secondary | ICD-10-CM | POA: Diagnosis not present

## 2023-12-08 LAB — HM DEXA SCAN

## 2023-12-12 DIAGNOSIS — M2040 Other hammer toe(s) (acquired), unspecified foot: Secondary | ICD-10-CM | POA: Diagnosis not present

## 2023-12-12 DIAGNOSIS — M1990 Unspecified osteoarthritis, unspecified site: Secondary | ICD-10-CM | POA: Diagnosis not present

## 2023-12-12 DIAGNOSIS — M810 Age-related osteoporosis without current pathological fracture: Secondary | ICD-10-CM | POA: Diagnosis not present

## 2023-12-12 DIAGNOSIS — J309 Allergic rhinitis, unspecified: Secondary | ICD-10-CM | POA: Diagnosis not present

## 2023-12-12 DIAGNOSIS — Z85850 Personal history of malignant neoplasm of thyroid: Secondary | ICD-10-CM | POA: Diagnosis not present

## 2023-12-12 DIAGNOSIS — Z880 Allergy status to penicillin: Secondary | ICD-10-CM | POA: Diagnosis not present

## 2023-12-12 DIAGNOSIS — G629 Polyneuropathy, unspecified: Secondary | ICD-10-CM | POA: Diagnosis not present

## 2023-12-12 DIAGNOSIS — Z79620 Long term (current) use of immunosuppressive biologic: Secondary | ICD-10-CM | POA: Diagnosis not present

## 2023-12-12 DIAGNOSIS — Z9989 Dependence on other enabling machines and devices: Secondary | ICD-10-CM | POA: Diagnosis not present

## 2023-12-12 DIAGNOSIS — N2581 Secondary hyperparathyroidism of renal origin: Secondary | ICD-10-CM | POA: Diagnosis not present

## 2023-12-12 DIAGNOSIS — E669 Obesity, unspecified: Secondary | ICD-10-CM | POA: Diagnosis not present

## 2023-12-12 DIAGNOSIS — Z9181 History of falling: Secondary | ICD-10-CM | POA: Diagnosis not present

## 2023-12-12 DIAGNOSIS — F325 Major depressive disorder, single episode, in full remission: Secondary | ICD-10-CM | POA: Diagnosis not present

## 2023-12-12 DIAGNOSIS — F411 Generalized anxiety disorder: Secondary | ICD-10-CM | POA: Diagnosis not present

## 2023-12-12 DIAGNOSIS — K219 Gastro-esophageal reflux disease without esophagitis: Secondary | ICD-10-CM | POA: Diagnosis not present

## 2023-12-12 DIAGNOSIS — M2010 Hallux valgus (acquired), unspecified foot: Secondary | ICD-10-CM | POA: Diagnosis not present

## 2023-12-12 DIAGNOSIS — E890 Postprocedural hypothyroidism: Secondary | ICD-10-CM | POA: Diagnosis not present

## 2023-12-12 DIAGNOSIS — E785 Hyperlipidemia, unspecified: Secondary | ICD-10-CM | POA: Diagnosis not present

## 2023-12-12 DIAGNOSIS — Z79899 Other long term (current) drug therapy: Secondary | ICD-10-CM | POA: Diagnosis not present

## 2023-12-12 DIAGNOSIS — I129 Hypertensive chronic kidney disease with stage 1 through stage 4 chronic kidney disease, or unspecified chronic kidney disease: Secondary | ICD-10-CM | POA: Diagnosis not present

## 2023-12-12 DIAGNOSIS — Z823 Family history of stroke: Secondary | ICD-10-CM | POA: Diagnosis not present

## 2023-12-12 DIAGNOSIS — N1832 Chronic kidney disease, stage 3b: Secondary | ICD-10-CM | POA: Diagnosis not present

## 2023-12-12 DIAGNOSIS — C50919 Malignant neoplasm of unspecified site of unspecified female breast: Secondary | ICD-10-CM | POA: Diagnosis not present

## 2023-12-12 DIAGNOSIS — M4800 Spinal stenosis, site unspecified: Secondary | ICD-10-CM | POA: Diagnosis not present

## 2023-12-15 DIAGNOSIS — M810 Age-related osteoporosis without current pathological fracture: Secondary | ICD-10-CM | POA: Diagnosis not present

## 2023-12-15 DIAGNOSIS — Z85850 Personal history of malignant neoplasm of thyroid: Secondary | ICD-10-CM | POA: Diagnosis not present

## 2023-12-15 DIAGNOSIS — Z1331 Encounter for screening for depression: Secondary | ICD-10-CM | POA: Diagnosis not present

## 2023-12-15 DIAGNOSIS — E782 Mixed hyperlipidemia: Secondary | ICD-10-CM | POA: Diagnosis not present

## 2023-12-15 DIAGNOSIS — R002 Palpitations: Secondary | ICD-10-CM | POA: Diagnosis not present

## 2023-12-15 DIAGNOSIS — Z8781 Personal history of (healed) traumatic fracture: Secondary | ICD-10-CM | POA: Diagnosis not present

## 2023-12-15 DIAGNOSIS — I38 Endocarditis, valve unspecified: Secondary | ICD-10-CM | POA: Diagnosis not present

## 2023-12-15 DIAGNOSIS — I491 Atrial premature depolarization: Secondary | ICD-10-CM | POA: Diagnosis not present

## 2023-12-15 DIAGNOSIS — I10 Essential (primary) hypertension: Secondary | ICD-10-CM | POA: Diagnosis not present

## 2023-12-15 DIAGNOSIS — I119 Hypertensive heart disease without heart failure: Secondary | ICD-10-CM | POA: Diagnosis not present

## 2023-12-15 DIAGNOSIS — N1832 Chronic kidney disease, stage 3b: Secondary | ICD-10-CM | POA: Diagnosis not present

## 2023-12-15 DIAGNOSIS — E890 Postprocedural hypothyroidism: Secondary | ICD-10-CM | POA: Diagnosis not present

## 2024-01-15 NOTE — Telephone Encounter (Signed)
 Encounter opened in error.

## 2024-01-15 NOTE — Telephone Encounter (Signed)
 duplicate

## 2024-01-28 ENCOUNTER — Ambulatory Visit

## 2024-02-03 ENCOUNTER — Ambulatory Visit: Admitting: Emergency Medicine

## 2024-02-03 VITALS — BP 112/62 | Ht 63.0 in | Wt 171.2 lb

## 2024-02-03 DIAGNOSIS — Z0000 Encounter for general adult medical examination without abnormal findings: Secondary | ICD-10-CM

## 2024-02-03 DIAGNOSIS — Z23 Encounter for immunization: Secondary | ICD-10-CM | POA: Diagnosis not present

## 2024-02-03 NOTE — Progress Notes (Signed)
 Chief Complaint  Patient presents with   Medicare Wellness     Subjective:   Latoya Stevenson is a 83 y.o. female who presents for a Medicare Annual Wellness Visit.  Allergies (verified) Flagyl [metronidazole hcl], Furosemide, Macrodantin [nitrofurantoin macrocrystal], Penicillins, Celebrex [celecoxib], and Other   History: Past Medical History:  Diagnosis Date   Allergic rhinitis    Anemia    low iron   Anxiety    Arthritis 07/2019   osteoporosis   Asthma without status asthmaticus    seasonal, not often   Bilateral sciatica    Breast cancer (HCC) 07/2019   invasive mammary cancer , grade 2   Bronchitis    Cancer (HCC)     follicular variant papillary thyroid  cancer   Chronic back pain    unspecified   Chronic kidney disease    ckd stage 3b   Colon polyp    adenomatous   DDD (degenerative disc disease), lumbar    DJD (degenerative joint disease)    Dysphagia    Dyspnea    E coli bacteremia    around 10/11   Environmental allergies    Family history of breast cancer    Family history of colon cancer    Family history of ovarian cancer    Family history of pancreatic cancer    Family history of prostate cancer    Family history of thyroid  cancer    GERD (gastroesophageal reflux disease)    Headache    migraines as a young woman   History of recurrent UTIs    Hyperlipidemia    Hypertension    Hypothyroidism    IBS (irritable bowel syndrome)    Lumbar compression fracture (HCC) 12/02/2016   Lumbar disc disease    Mold exposure 2002   hx of    Osteoporosis, post-menopausal    Personal history of radiation therapy    Pneumonia 1978   Restless legs    Scoliosis    Spinal stenosis    Thyroid  nodule    aspirated in 1997 and 2003 which were negative   Ulcer    Varicose veins of left lower extremity    Vertigo    Wears dentures    Past Surgical History:  Procedure Laterality Date   ABDOMINAL HYSTERECTOMY  1977   Dr. Darra MO FRACTURE SURGERY Left  2003   arch of foot.  no metal. per patient, she did not have surgery for this fracture. healed on its own   ANTERIOR LAT LUMBAR FUSION Left 09/09/2016   Procedure: Left Lumbar two-three Anterolateral lumbar interbody fusion with lateral plate;  Surgeon: Unice Pac, MD;  Location: [facility] OR;  Service: Neurosurgery;  Laterality: Left;  Left L2-3 Anterolateral lumbar interbody fusion with lateral plate   APPENDECTOMY     BACK SURGERY  2013, 2016, 2018   lumbar lam, fusion, discectomy.titanium   BIOPSY THYROID      x2  thyroid  nodule ; Dr. Rollene and one by Dr. Cleotilde   BREAST BIOPSY Left 08/02/2019   coil clip-INVASIVE MAMMARY CARCINOMA   BREAST CYST ASPIRATION  1970's   BREAST LUMPECTOMY Left 2021   BREAST LUMPECTOMY WITH RADIOFREQUENCY TAG IDENTIFICATION Left 08/12/2019   Procedure: BREAST LUMPECTOMY WITH RADIOFREQUENCY TAG IDENTIFICATION;  Surgeon: Lane Shope, MD;  Location: [facility] ORS;  Service: General;  Laterality: Left;   BREAST LUMPECTOMY WITH SENTINEL LYMPH NODE BIOPSY Left 08/12/2019   Procedure: BREAST LUMPECTOMY WITH SENTINEL LYMPH NODE BX;  Surgeon: Lane Shope, MD;  Location:  [facility] ORS;  Service: General;  Laterality: Left;   broken arm Left 1994   wrist fracture surgery, left.  metal has been replaced.   CARDIOVASCULAR STRESS TEST     2010   COLONOSCOPY  2010   COLONOSCOPY  07/21/2013   PHX OF CP/REPEAT 5YRS/[place]   ESOPHAGOGASTRODUODENOSCOPY (EGD) WITH PROPOFOL  N/A 09/22/2018   Procedure: ESOPHAGOGASTRODUODENOSCOPY (EGD) WITH PROPOFOL ;  Surgeon: Toledo, Ladell POUR, MD;  Location: [facility] ENDOSCOPY;  Service: Gastroenterology;  Laterality: N/A;  Patient to have Rapid COVID-19 day of procedure Nan approved this)   EYE SURGERY Bilateral    cataract surgery with lens implant   FOREIGN BODY REMOVAL N/A 12/09/2019   Procedure: FOREIGN BODY REMOVAL ADULT;  Surgeon: Kathlynn Sharper, MD;  Location: [facility] ORS;  Service: Orthopedics;  Laterality: N/A;   INCISION AND DRAINAGE Left  12/09/2019   Procedure: INCISION AND DRAINAGE;  Surgeon: Kathlynn Sharper, MD;  Location: [facility] ORS;  Service: Orthopedics;  Laterality: Left;   LACERATION REPAIR Bilateral 12/09/2019   Procedure: REPAIR MULTIPLE LACERATIONS;  Surgeon: Kathlynn Sharper, MD;  Location: [facility] ORS;  Service: Orthopedics;  Laterality: Bilateral;   LUMBAR LAMINECTOMY/DECOMPRESSION MICRODISCECTOMY  11/18/2011   Procedure: LUMBAR LAMINECTOMY/DECOMPRESSION MICRODISCECTOMY 2 LEVELS;  Surgeon: Fairy Levels, MD;  Location: [facility];  Service: Neurosurgery;  Laterality: N/A;  Lumbar Three-Four,  Lumbar Four-Five Decompressive Laminectomy   MALONEY DILATION N/A 09/22/2018   Procedure: MALONEY DILATION;  Surgeon: Toledo, Ladell POUR, MD;  Location: [facility];  Service: Gastroenterology;  Laterality: N/A;   OVARIAN CYST SURGERY     POSTERIOR FUSION LUMBAR SPINE  2016   TOTAL THYROIDECTOMY     TRANSTHORACIC ECHOCARDIOGRAM  2010   upper and lower GI  2010   Family History  Problem Relation Age of Onset   Breast cancer Mother 25   Thyroid  cancer Mother    Pancreatic cancer Brother    Pancreatic cancer Sister    Suicidality Father    Prostate cancer Brother    Colon cancer Brother    Breast cancer Sister        x 2 times   Heart attack Sister    Ovarian cancer Maternal Aunt    Breast cancer Paternal Aunt    Lung cancer Paternal Uncle    Breast cancer Other    Colon cancer Maternal Aunt    Pancreatic cancer Maternal Aunt    Social History   Occupational History   Occupation: designer, 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    Comment: retired   Occupation: housekeeping & dining at [employer]   Occupation: retired  Tobacco Use   Smoking status: Never    Passive exposure: Never   Smokeless tobacco: Never  Vaping Use   Vaping status: Never Used  Substance and Sexual Activity   Alcohol use: Not Currently    Comment: socially-rare   Drug use: No   Sexual activity: Not Currently   Tobacco Counseling Counseling given: Not  Answered  SDOH Screenings   Food Insecurity: No Food Insecurity (02/03/2024)  Housing: Low Risk  (02/03/2024)  Transportation Needs: No Transportation Needs (02/03/2024)  Utilities: Not At Risk (02/03/2024)  Alcohol Screen: Low Risk  (01/07/2023)  Depression (PHQ2-9): Low Risk  (02/03/2024)  Financial Resource Strain: Low Risk  (01/07/2023)  Physical Activity: Inactive (02/03/2024)  Social Connections: Moderately Isolated (02/03/2024)  Stress: No Stress Concern Present (02/03/2024)  Tobacco Use: Low Risk  (02/03/2024)  Health Literacy: Adequate Health Literacy (02/03/2024)   See flowsheets for full screening details  Depression Screen  PHQ 2 & 9 Depression Scale- Over the past 2 weeks, how often have you been bothered by any of the following problems? Little interest or pleasure in doing things: 0 Feeling down, depressed, or hopeless (PHQ Adolescent also includes...irritable): 0 PHQ-2 Total Score: 0 Trouble falling or staying asleep, or sleeping too much: 0 Feeling tired or having little energy: 0 Poor appetite or overeating (PHQ Adolescent also includes...weight loss): 0 Feeling bad about yourself - or that you are a failure or have let yourself or your family down: 0 Trouble concentrating on things, such as reading the newspaper or watching television (PHQ Adolescent also includes...like school work): 0 Moving or speaking so slowly that other people could have noticed. Or the opposite - being so fidgety or restless that you have been moving around a lot more than usual: 0 Thoughts that you would be better off dead, or of hurting yourself in some way: 0 PHQ-9 Total Score: 0 If you checked off any problems, how difficult have these problems made it for you to do your work, take care of things at home, or get along with other people?: Not difficult at all  Depression Treatment Depression Interventions/Treatment : EYV7-0 Score <4 Follow-up Not Indicated; Currently on Treatment      Goals Addressed               This Visit's Progress     Eat less red meat (pt-stated)         Visit info / Clinical Intake: Medicare Wellness Visit Type:: Subsequent Annual Wellness Visit Persons participating in visit:: patient Medicare Wellness Visit Mode:: In-person (required for WTM) Information given by:: patient Interpreter Needed?: No Pre-visit prep was completed: yes AWV questionnaire completed by patient prior to visit?: no Living arrangements:: lives with spouse/significant other Patient's Overall Health Status Rating: good Typical amount of pain: none Does pain affect daily life?: no Are you currently prescribed opioids?: no  Dietary Habits and Nutritional Risks How many meals a day?: 2 Eats fruit and vegetables daily?: yes Most meals are obtained by: preparing own meals In the last 2 weeks, have you had any of the following?: (!) nausea, vomiting, diarrhea (diarrhea due to IBS) Diabetic:: no  Functional Status Activities of Daily Living (to include ambulation/medication): Independent Ambulation: Independent with device- listed below Home Assistive Devices/Equipment: Other (Comment); Eyeglasses (walking stick, glasses for reading) Medication Administration: Independent Home Management: Independent Manage your own finances?: yes Primary transportation is: driving Concerns about vision?: no *vision screening is required for WTM* Concerns about hearing?: no  Fall Screening Falls in the past year?: 1 Number of falls in past year: 1 Was there an injury with Fall?: 0 Fall Risk Category Calculator: 2 Patient Fall Risk Level: Moderate Fall Risk  Fall Risk Patient at Risk for Falls Due to: History of fall(s); Impaired balance/gait; Impaired mobility Fall risk Follow up: Falls evaluation completed; Education provided  Home and Transportation Safety: All rugs have non-skid backing?: yes All stairs or steps have railings?: yes Grab bars in the bathtub or  shower?: yes Have non-skid surface in bathtub or shower?: yes Good home lighting?: yes Regular seat belt use?: yes Hospital stays in the last year:: no  Cognitive Assessment Difficulty concentrating, remembering, or making decisions? : no Will 6CIT or Mini Cog be Completed: yes What year is it?: 0 points What month is it?: 0 points Give patient an address phrase to remember (5 components): [address] [place] About what time is it?: 0 points Count  backwards from 20 to 1: 0 points Say the months of the year in reverse: 0 points Repeat the address phrase from earlier: 0 points 6 CIT Score: 0 points  Advance Directives (For Healthcare) Does Patient Have a Medical Advance Directive?: Yes Does patient want to make changes to medical advance directive?: No - Patient declined Type of Advance Directive: Healthcare Power of [place]; Living will Copy of Healthcare Power of Attorney in Chart?: Yes - validated most recent copy scanned in chart (See row information) Copy of Living Will in Chart?: Yes - validated most recent copy scanned in chart (See row information)  Reviewed/Updated  Reviewed/Updated: Reviewed All (Medical, Surgical, Family, Medications, Allergies, Care Teams, Patient Goals)        Objective:    Today's Vitals   02/03/24 1130  BP: 112/62  Weight: 171 lb 3.2 oz (77.7 kg)  Height: 5' 3 (1.6 m)   Body mass index is 30.33 kg/m.  Current Medications (verified) Outpatient Encounter Medications as of 02/03/2024  Medication Sig   acetaminophen  (TYLENOL ) 650 MG CR tablet Take 1,300 mg by mouth every 8 (eight) hours as needed for pain.   anastrozole  (ARIMIDEX ) 1 MG tablet Take 1 tablet (1 mg total) by mouth daily.   Ascorbic Acid (VITAMIN C) 1000 MG tablet Take 1,000 mg by mouth daily.   atorvastatin  (LIPITOR) 40 MG tablet Take 1 tablet (40 mg total) by mouth at bedtime.   Calcium  Carbonate-Vitamin D  600-400 MG-UNIT tablet Take 2 tablets by mouth daily.    chlorthalidone  (HYGROTON ) 50 MG tablet Take 1 tablet by mouth 2 (two) times daily.   cholecalciferol  (VITAMIN D3) 25 MCG (1000 UNIT) tablet Take 1,000 Units by mouth daily.   denosumab  (PROLIA ) 60 MG/ML SOSY injection Inject 60 mg into the skin every 6 (six) months.   diclofenac  Sodium (VOLTAREN ) 1 % GEL Apply 2 g topically 4 (four) times daily.   doxylamine , Sleep, (UNISOM ) 25 MG tablet Take 25 mg by mouth.   fexofenadine (ALLEGRA) 180 MG tablet Take 180 mg by mouth daily.   fluticasone  (FLONASE ) 50 MCG/ACT nasal spray USE 2 SPRAYS IN EACH NOSTRIL EVERY DAY AS NEEDED FOR ALLERGIES   gabapentin  (NEURONTIN ) 300 MG capsule Take 600 mg by mouth 3 (three) times daily. (Patient taking differently: Take 600 mg by mouth 3 (three) times daily. Taking 300mg  twice a day)   GLUCOSAMINE-CHONDROITIN PO Take 1 tablet by mouth daily.    irbesartan  (AVAPRO ) 300 MG tablet TAKE 1 TABLET EVERY DAY   levothyroxine  (SYNTHROID ) 112 MCG tablet Take 112 mcg by mouth daily.   Multiple Vitamin (MULTIVITAMIN WITH MINERALS) TABS Take 1 tablet by mouth daily.   omeprazole  (PRILOSEC) 40 MG capsule Take 1 capsule (40 mg total) by mouth daily.   sertraline  (ZOLOFT ) 50 MG tablet TAKE 1 TABLET EVERY DAY (Patient taking differently: Take 50 mg by mouth daily. Taking 1/2 tablet daily)   vitamin E  180 MG (400 UNITS) capsule Take 400 Units by mouth daily.   tobramycin -dexamethasone  (TOBRADEX ) ophthalmic solution Place 2 drops into both eyes every 6 (six) hours. (Patient not taking: Reported on 02/03/2024)   No facility-administered encounter medications on file as of 02/03/2024.   Hearing/Vision screen Hearing Screening - Comments:: Denies hearing loss  Vision Screening - Comments:: UTD @ [facility] [place], Dr. Dingeldein Immunizations and Health Maintenance Health Maintenance  Topic Date Due   COVID-19 Vaccine (4 - 2025-26 season) 11/16/2023   Mammogram  02/02/2024   Zoster Vaccines- Shingrix (1 of 2) 02/05/2024  (  Originally 03/09/1960)   DTaP/Tdap/Td (2 - Td or Tdap) 08/08/2024   Medicare Annual Wellness (AWV)  02/02/2025   Pneumococcal Vaccine: 50+ Years  Completed   Influenza Vaccine  Completed   DEXA SCAN  Completed   Meningococcal B Vaccine  Aged Out   Hepatitis C Screening  Discontinued        Assessment/Plan:  This is a routine wellness examination for [facility].  Patient Care Team: Justus Leita DEL, MD as PCP - General (Internal Medicine) Melanee Annah BROCKS, MD as Consulting Physician (Oncology) Cindie Jesusa HERO, RN as Registered Nurse Tobie Lady POUR, MD as Consulting Physician (Rheumatology) Damian Therisa HERO, MD as Consulting Physician (Endocrinology) Lenn Standing, MD (Ophthalmology) Launie Maiden, Ronal Maxwell, NP as Nurse Practitioner (Cardiology) Marcelino Gales, MD (Nephrology) Louvella Ozell RAMAN, DPM (Podiatry)  I have personally reviewed and noted the following in the patient's chart:   Medical and social history Use of alcohol, tobacco or illicit drugs  Current medications and supplements including opioid prescriptions. Functional ability and status Nutritional status Physical activity Advanced directives List of other physicians Hospitalizations, surgeries, and ER visits in previous 12 months Vitals Screenings to include cognitive, depression, and falls Referrals and appointments  Orders Placed This Encounter  Procedures   HM DEXA SCAN    This external order was created through the Results Console.   Flu vaccine HIGH DOSE PF(Fluzone Trivalent)   In addition, I have reviewed and discussed with patient certain preventive protocols, quality metrics, and best practice recommendations. A written personalized care plan for preventive services as well as general preventive health recommendations were provided to patient.   Vina Ned, CMA   02/03/2024   Return in 13 months (on 02/23/2025).  After Visit Summary: (In Person-Printed) AVS printed and given to the  patient  Nurse Notes:  Gave flu vaccine at today's visit Declined covid and shingles vaccines Screening colonoscopy no longer recommended due to age.

## 2024-02-03 NOTE — Patient Instructions (Signed)
 Latoya Stevenson,  Thank you for taking the time for your Medicare Wellness Visit. I appreciate your continued commitment to your health goals. Please review the care plan we discussed, and feel free to reach out if I can assist you further.  Please note that Annual Wellness Visits do not include a physical exam. Some assessments may be limited, especially if the visit was conducted virtually. If needed, we may recommend an in-person follow-up with your provider.  Ongoing Care Seeing your primary care provider every 3 to 6 months helps us  monitor your health and provide consistent, personalized care.   Referrals If a referral was made during today's visit and you haven't received any updates within two weeks, please contact the referred provider directly to check on the status.  Recommended Screenings:  Keep up the good work!  Health Maintenance  Topic Date Due   COVID-19 Vaccine (4 - 2025-26 season) 11/16/2023   Medicare Annual Wellness Visit  01/07/2024   Breast Cancer Screening  02/02/2024   Zoster (Shingles) Vaccine (1 of 2) 02/05/2024*   DTaP/Tdap/Td vaccine (2 - Td or Tdap) 08/08/2024   Pneumococcal Vaccine for age over 47  Completed   Flu Shot  Completed   DEXA scan (bone density measurement)  Completed   Meningitis B Vaccine  Aged Out   Hepatitis C Screening  Discontinued  *Topic was postponed. The date shown is not the original due date.       02/03/2024   11:38 AM  Advanced Directives  Does Patient Have a Medical Advance Directive? Yes  Type of Estate Agent of [place];Living will  Does patient want to make changes to medical advance directive? No - Patient declined  Copy of Healthcare Power of Attorney in Chart? Yes - validated most recent copy scanned in chart (See row information)    Vision: Annual vision screenings are recommended for early detection of glaucoma, cataracts, and diabetic retinopathy. These exams can also reveal signs of chronic  conditions such as diabetes and high blood pressure.  Dental: Annual dental screenings help detect early signs of oral cancer, gum disease, and other conditions linked to overall health, including heart disease and diabetes.  Please see the attached documents for additional preventive care recommendations.   Fall Prevention in the Home, Adult Falls can cause injuries and affect people of all ages. There are many simple things that you can do to make your home safe and to help prevent falls. If you need it, ask for help making these changes. What actions can I take to prevent falls? General information Use good lighting in all rooms. Make sure to: Replace any light bulbs that burn out. Turn on lights if it is dark and use night-lights. Keep items that you use often in easy-to-reach places. Lower the shelves around your home if needed. Move furniture so that there are clear paths around it. Do not keep throw rugs or other things on the floor that can make you trip. If any of your floors are uneven, fix them. Add color or contrast paint or tape to clearly mark and help you see: Grab bars or handrails. First and last steps of staircases. Where the edge of each step is. If you use a ladder or stepladder: Make sure that it is fully opened. Do not climb a closed ladder. Make sure the sides of the ladder are locked in place. Have someone hold the ladder while you use it. Know where your pets are as you move through  your home. What can I do in the bathroom?     Keep the floor dry. Clean up any water that is on the floor right away. Remove soap buildup in the bathtub or shower. Buildup makes bathtubs and showers slippery. Use non-skid mats or decals on the floor of the bathtub or shower. Attach bath mats securely with double-sided, non-slip rug tape. If you need to sit down while you are in the shower, use a non-slip stool. Install grab bars by the toilet and in the bathtub and shower. Do  not use towel bars as grab bars. What can I do in the bedroom? Make sure that you have a light by your bed that is easy to reach. Do not use any sheets or blankets on your bed that hang to the floor. Have a firm bench or chair with side arms that you can use for support when you get dressed. What can I do in the kitchen? Clean up any spills right away. If you need to reach something above you, use a sturdy step stool that has a grab bar. Keep electrical cables out of the way. Do not use floor polish or wax that makes floors slippery. What can I do with my stairs? Do not leave anything on the stairs. Make sure that you have a light switch at the top and the bottom of the stairs. Have them installed if you do not have them. Make sure that there are handrails on both sides of the stairs. Fix handrails that are broken or loose. Make sure that handrails are as long as the staircases. Install non-slip stair treads on all stairs in your home if they do not have carpet. Avoid having throw rugs at the top or bottom of stairs, or secure the rugs with carpet tape to prevent them from moving. Choose a carpet design that does not hide the edge of steps on the stairs. Make sure that carpet is firmly attached to the stairs. Fix any carpet that is loose or worn. What can I do on the outside of my home? Use bright outdoor lighting. Repair the edges of walkways and driveways and fix any cracks. Clear paths of anything that can make you trip, such as tools or rocks. Add color or contrast paint or tape to clearly mark and help you see high doorway thresholds. Trim any bushes or trees on the main path into your home. Check that handrails are securely fastened and in good repair. Both sides of all steps should have handrails. Install guardrails along the edges of any raised decks or porches. Have leaves, snow, and ice cleared regularly. Use sand, salt, or ice melt on walkways during winter months if you live where  there is ice and snow. In the garage, clean up any spills right away, including grease or oil spills. What other actions can I take? Review your medicines with your health care provider. Some medicines can make you confused or feel dizzy. This can increase your chance of falling. Wear closed-toe shoes that fit well and support your feet. Wear shoes that have rubber soles and low heels. Use a cane, walker, scooter, or crutches that help you move around if needed. Talk with your provider about other ways that you can decrease your risk of falls. This may include seeing a physical therapist to learn to do exercises to improve movement and strength. Where to find more information Centers for Disease Control and Prevention, [employer]: tonerpromos.no [employer] on Aging: baseringtones.pl National  Institute on Aging: baseringtones.pl Contact a health care provider if: You are afraid of falling at home. You feel weak, drowsy, or dizzy at home. You fall at home. Get help right away if you: Lose consciousness or have trouble moving after a fall. Have a fall that causes a head injury. These symptoms may be an emergency. Get help right away. Call 911. Do not wait to see if the symptoms will go away. Do not drive yourself to the hospital. This information is not intended to replace advice given to you by your health care provider. Make sure you discuss any questions you have with your health care provider. Document Revised: 11/04/2021 Document Reviewed: 11/04/2021 Elsevier Patient Education  2024 [employer].

## 2024-02-04 ENCOUNTER — Ambulatory Visit
Admission: RE | Admit: 2024-02-04 | Discharge: 2024-02-04 | Disposition: A | Discharge status: Home / Self Care | Source: Ambulatory Visit | Attending: Surgery | Admitting: Surgery

## 2024-02-04 DIAGNOSIS — Z1231 Encounter for screening mammogram for malignant neoplasm of breast: Secondary | ICD-10-CM | POA: Insufficient documentation

## 2024-02-12 ENCOUNTER — Other Ambulatory Visit: Payer: Self-pay | Admitting: Internal Medicine

## 2024-02-16 NOTE — Telephone Encounter (Signed)
 Requested Prescriptions  Pending Prescriptions Disp Refills   irbesartan  (AVAPRO ) 300 MG tablet [Pharmacy Med Name: IRBESARTAN  300 MG Oral Tablet] 90 tablet 0    Sig: TAKE 1 TABLET EVERY DAY     Cardiovascular:  Angiotensin Receptor Blockers Failed - 02/16/2024  1:24 PM      Failed - Cr in normal range and within 180 days    Creatinine  Date Value Ref Range Status  10/15/2023 1.6 (A) 0.5 - 1.1 Final   Creatinine, Ser  Date Value Ref Range Status  10/09/2022 1.42 (H) 0.57 - 1.00 mg/dL Final         Passed - K in normal range and within 180 days    Potassium  Date Value Ref Range Status  10/15/2023 3.8 3.5 - 5.1 mEq/L Final         Passed - Patient is not pregnant      Passed - Last BP in normal range    BP Readings from Last 1 Encounters:  02/03/24 112/62         Passed - Valid encounter within last 6 months    Recent Outpatient Visits           3 months ago Annual physical exam   [facility], Leita DEL, MD   7 months ago Left leg cellulitis   [facility] at [facility], Selinda PARAS, MD   10 months ago Benign essential hypertension   [facility] at [facility], Leita DEL, MD

## 2024-02-18 ENCOUNTER — Ambulatory Visit: Admitting: General Surgery

## 2024-02-22 ENCOUNTER — Other Ambulatory Visit: Payer: Self-pay | Admitting: Oncology

## 2024-02-23 ENCOUNTER — Encounter: Payer: Self-pay | Admitting: General Surgery

## 2024-02-23 ENCOUNTER — Ambulatory Visit: Admitting: General Surgery

## 2024-02-23 VITALS — BP 150/76 | HR 69 | Ht 63.0 in | Wt 174.0 lb

## 2024-02-23 DIAGNOSIS — Z853 Personal history of malignant neoplasm of breast: Secondary | ICD-10-CM

## 2024-02-23 DIAGNOSIS — Z08 Encounter for follow-up examination after completed treatment for malignant neoplasm: Secondary | ICD-10-CM

## 2024-02-23 NOTE — Progress Notes (Signed)
 Surgical Clinic Progress/Follow-up Note   HPI:  83 y.o. Female presents to clinic for left breast cancer follow-up.  She reports no issues with either breast  Review of Systems:  Constitutional: denies fever/chills  ENT: denies sore throat, hearing problems  Respiratory: denies shortness of breath, wheezing  Cardiovascular: denies chest pain, palpitations  Gastrointestinal: denies abdominal pain, N/V, or diarrhea Skin: Denies any other rashes or skin discolorations  Vital Signs:  BP (!) 150/76   Pulse 69   Ht 5' 3 (1.6 m)   Wt 174 lb (78.9 kg)   SpO2 98%   BMI 30.82 kg/m    Physical Exam:  Constitutional:  -- Normal body habitus  -- Awake, alert, and oriented x3  Pulmonary:  -- No crackles -- Equal breath sounds bilaterally -- Breathing non-labored at rest Cardiovascular:  -- S1, S2 present  -- No pericardial rubs  Gastrointestinal:  -- Soft and non-distended, non-tender/with no tenderness to palpation, no guarding/rebound tenderness GU  --bilateral breast exam unremarkable for any skin changes, suspicious or dominant nodularity.  Her left central-areolar scar is well-healed Musculoskeletal / Integumentary:  -- Wounds or skin discoloration: None appreciated    Laboratory studies: Radiology review:   Imaging:  MPRESSION: No mammographic evidence of malignancy. A result letter of this screening mammogram will be mailed directly to the patient.   RECOMMENDATION: Screening mammogram in one year. (Code:[id number]-01Y)   BI-RADS CATEGORY  1: Negative. Assessment:  83 y.o. yo Female with a problem list including...  Patient Active Problem List   Diagnosis Date Noted   Anemia in stage 3b chronic kidney disease (HCC) 12/01/2023   Primary osteoarthritis involving multiple joints 11/05/2023   Toe ulcer, right, limited to breakdown of skin (HCC) 06/26/2023   Hallux valgus of right foot 06/26/2023   Callous ulcer, limited to breakdown of skin (HCC) 04/22/2023   Plantar  fasciitis of right foot 03/26/2022   Generalized anxiety disorder 11/04/2021   Left leg cellulitis 10/09/2021   History of breast cancer 10/09/2021   Lumbar radiculopathy 10/09/2021   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) 08/04/2019   Esophageal stenosis 01/28/2018   Varicose veins of left leg with edema 07/29/2017   Dysphagia, oropharyngeal 04/30/2017   Lumbar degenerative disc disease 04/30/2017   Spinal stenosis of lumbar region 12/08/2016   Idiopathic scoliosis of lumbar region 09/09/2016   Chronic midline low back pain with right-sided sciatica 07/28/2016   GERD without esophagitis 07/27/2015   Intermittent vertigo 07/27/2015   Osteoporosis, senile 07/27/2015   Seasonal allergic rhinitis due to pollen 07/27/2015   Acquired hypothyroidism 01/05/2015   Benign essential hypertension 06/29/2014   Mixed hyperlipidemia 06/29/2014   Stage 3b chronic kidney disease (HCC) 06/29/2014   Insomnia 11/11/2013   Mild reactive airways disease 11/11/2013    presents to clinic for follow-up evaluation of left breast cancer, progressing well.  Plan:              - return to clinic in 1 year with follow-up imaging, or as needed, instructed to call office if any questions or concerns  -Continue her Arimidex /Prolia       Jayson Endow, M.D. [facility]

## 2024-02-23 NOTE — Patient Instructions (Addendum)
 Patient will be asked to return to the office in one year with a bilateral screening mammogram.  We will send you a letter about these appointments.   Continue self breast exams. Call office for any new breast issues or concerns.

## 2024-04-05 ENCOUNTER — Other Ambulatory Visit: Payer: Self-pay

## 2024-04-05 DIAGNOSIS — K219 Gastro-esophageal reflux disease without esophagitis: Secondary | ICD-10-CM

## 2024-04-05 DIAGNOSIS — E782 Mixed hyperlipidemia: Secondary | ICD-10-CM

## 2024-04-05 MED ORDER — ATORVASTATIN CALCIUM 40 MG PO TABS: 40.0000 mg | ORAL_TABLET | Freq: Every day | ORAL | 0 refills | Status: DC

## 2024-04-05 MED ORDER — OMEPRAZOLE 40 MG PO CPDR: 40.0000 mg | DELAYED_RELEASE_CAPSULE | Freq: Every day | ORAL | 0 refills | Status: DC

## 2024-04-06 ENCOUNTER — Other Ambulatory Visit: Payer: Self-pay

## 2024-04-06 DIAGNOSIS — E782 Mixed hyperlipidemia: Secondary | ICD-10-CM

## 2024-04-06 DIAGNOSIS — K219 Gastro-esophageal reflux disease without esophagitis: Secondary | ICD-10-CM

## 2024-04-06 MED ORDER — OMEPRAZOLE 40 MG PO CPDR: 40.0000 mg | DELAYED_RELEASE_CAPSULE | Freq: Every day | ORAL | 0 refills | Status: AC

## 2024-04-06 MED ORDER — ATORVASTATIN CALCIUM 40 MG PO TABS: 40.0000 mg | ORAL_TABLET | Freq: Every day | ORAL | 0 refills | Status: AC

## 2024-04-12 ENCOUNTER — Telehealth: Payer: Self-pay

## 2024-04-12 NOTE — Telephone Encounter (Signed)
-----   Message from Grayce FALCON sent at 04/12/2024  1:05 PM EST ----- Regarding: needs authorization Contact: [phone] Please call this lady. I'm not sure exactly what shwe is needing. She is saying in order for [facility] to pay a bill they need an auth number.

## 2024-04-12 NOTE — Telephone Encounter (Signed)
 Received the following message: Please call.  I'm not sure exactly what she is needing. She is saying in order for [facility] to pay a bill they need an auth number. Outbound call to patient; patient is asking for authorization number for referral to general surgery. Informed patient she was referred to general surgery by Dr. Justus. Patient mentioned Dr. Delfin has retired; advised to contact their office and see if an authorization number had to be indeed by obtained.  Also advised may reach out to general surgery as well, they may be able to assist.  Reminded of upcoming appointment with Dr. Melanee on 04/20/24 at 2:15pm; patient verbalized understanding.

## 2024-04-14 ENCOUNTER — Other Ambulatory Visit: Payer: Self-pay

## 2024-04-14 DIAGNOSIS — F411 Generalized anxiety disorder: Secondary | ICD-10-CM

## 2024-04-15 MED ORDER — SERTRALINE HCL 50 MG PO TABS: 50.0000 mg | ORAL_TABLET | Freq: Every day | ORAL | 0 refills | Status: AC

## 2024-04-20 ENCOUNTER — Encounter: Payer: Self-pay | Admitting: Oncology

## 2024-04-20 ENCOUNTER — Inpatient Hospital Stay: Admitting: Oncology

## 2024-04-20 VITALS — BP 129/88 | HR 81 | Temp 96.6°F | Resp 18 | Ht 63.0 in | Wt 176.6 lb

## 2024-04-20 DIAGNOSIS — Z08 Encounter for follow-up examination after completed treatment for malignant neoplasm: Secondary | ICD-10-CM | POA: Diagnosis not present

## 2024-04-20 DIAGNOSIS — Z853 Personal history of malignant neoplasm of breast: Secondary | ICD-10-CM | POA: Diagnosis not present

## 2024-04-20 DIAGNOSIS — Z79811 Long term (current) use of aromatase inhibitors: Secondary | ICD-10-CM

## 2024-04-20 DIAGNOSIS — Z79899 Other long term (current) drug therapy: Secondary | ICD-10-CM

## 2024-04-20 DIAGNOSIS — Z5181 Encounter for therapeutic drug level monitoring: Secondary | ICD-10-CM

## 2024-04-20 NOTE — Progress Notes (Signed)
 "    Hematology/Oncology Consult note [facility]  [phone] [phone]  Patient Care Team: Justus Leita DEL, MD as PCP - General (Internal Medicine) Melanee Annah BROCKS, MD as Consulting Physician (Oncology) Cindie Jesusa HERO, RN as Registered Nurse Tobie Lady POUR, MD as Consulting Physician (Rheumatology) Damian Therisa HERO, MD as Consulting Physician (Endocrinology) Lenn Standing, MD (Ophthalmology) Launie Maiden, Ronal Maxwell, NP as Nurse Practitioner (Cardiology) Marcelino Gales, MD (Nephrology) Louvella Ozell RAMAN, DPM (Podiatry)   Name of the patient: Latoya Stevenson  [id number]  16-May-1940   Date of visit: 04/20/24  Diagnosis-   pathological prognostic stage Ia 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  She then noticed possible nipple discharge from her right breast which prompted a diagnostic right breast mammogram which was essentially unremarkable.  MRI was recommended and patient underwent a bilateral MRI which did not show any concerning findings in the right breast where she had the nipple discharge but showed an enhancing mass in the inner left breast measuring 0.9 x 0.6 x 0.7 cm which was suspicious and biopsy. Biopsy was consistent with invasive mammary carcinoma grade 2 6 mm ER greater than 90% positive PR greater than 90% positive and HER-2 negative.   Genetic testing negative   Final pathology showed 7 mm grade 2 invasive mammary carcinoma with positive unifocal anterior margin.  1 sentinel lymph node negative for malignancy.  PT1BPN0 tumor was greater than 90% ER positive PR positive and HER-2 negative   Patient completed adjuvant radiation therapy and started  Arimidex  in October 2021.  She has baseline osteoporosis and follows up with endocrinology.  She gets Prolia     Interval history-tolerating Arimidex  well without any significant side effects.  Denies any breast concerns today.  No recent falls.  No changes in her appetite or weight  ECOG PS- 1 Pain scale- 0   Review of systems- Review of Systems  Constitutional:  Negative for chills, fever, malaise/fatigue and weight loss.  HENT:  Negative for congestion, ear discharge and nosebleeds.   Eyes:  Negative for blurred vision.  Respiratory:  Negative for cough, hemoptysis, sputum production, shortness of breath and wheezing.   Cardiovascular:  Negative for chest pain, palpitations, orthopnea and claudication.  Gastrointestinal:  Negative for abdominal pain, blood in stool, constipation, diarrhea, heartburn, melena, nausea and vomiting.  Genitourinary:  Negative for dysuria, flank pain, frequency, hematuria and urgency.  Musculoskeletal:  Negative for back pain, joint pain and myalgias.  Skin:  Negative for rash.  Neurological:  Negative for dizziness, tingling, focal weakness, seizures, weakness and headaches.  Endo/Heme/Allergies:  Does not bruise/bleed easily.  Psychiatric/Behavioral:  Negative for depression and suicidal ideas. The patient does not have insomnia.       Allergies[1]   Past Medical History:  Diagnosis Date   Allergic rhinitis    Anemia    low iron   Anxiety    Arthritis 07/2019   osteoporosis   Asthma without status asthmaticus    seasonal, not often   Bilateral sciatica    Breast cancer (HCC) 07/2019   invasive mammary cancer , grade 2   Bronchitis    Cancer (HCC)     follicular variant papillary thyroid  cancer   Chronic back pain  unspecified   Chronic kidney disease    ckd stage 3b   Colon polyp    adenomatous   DDD (degenerative disc disease), lumbar    DJD (degenerative joint disease)    Dysphagia    Dyspnea    E coli bacteremia    around  10/11   Environmental allergies    Family history of breast cancer    Family history of colon cancer    Family history of ovarian cancer    Family history of pancreatic cancer    Family history of prostate cancer    Family history of thyroid  cancer    GERD (gastroesophageal reflux disease)    Headache    migraines as a young woman   History of recurrent UTIs    Hyperlipidemia    Hypertension    Hypothyroidism    IBS (irritable bowel syndrome)    Lumbar compression fracture (HCC) 12/02/2016   Lumbar disc disease    Mold exposure 2002   hx of    Osteoporosis, post-menopausal    Personal history of radiation therapy    Pneumonia 1978   Restless legs    Scoliosis    Spinal stenosis    Thyroid  nodule    aspirated in 1997 and 2003 which were negative   Ulcer    Varicose veins of left lower extremity    Vertigo    Wears dentures      Past Surgical History:  Procedure Laterality Date   ABDOMINAL HYSTERECTOMY  1977   Dr. Darra MO FRACTURE SURGERY Left 2003   arch of foot.  no metal. per patient, she did not have surgery for this fracture. healed on its own   ANTERIOR LAT LUMBAR FUSION Left 09/09/2016   Procedure: Left Lumbar two-three Anterolateral lumbar interbody fusion with lateral plate;  Surgeon: Unice Pac, MD;  Location: [facility] OR;  Service: Neurosurgery;  Laterality: Left;  Left L2-3 Anterolateral lumbar interbody fusion with lateral plate   APPENDECTOMY     BACK SURGERY  2013, 2016, 2018   lumbar lam, fusion, discectomy.titanium   BIOPSY THYROID      x2  thyroid  nodule ; Dr. Rollene and one by Dr. Cleotilde   BREAST BIOPSY Left 08/02/2019   coil clip-INVASIVE MAMMARY CARCINOMA   BREAST CYST ASPIRATION  1970's   BREAST LUMPECTOMY Left 2021   BREAST LUMPECTOMY WITH RADIOFREQUENCY TAG IDENTIFICATION Left 08/12/2019   Procedure: BREAST LUMPECTOMY WITH RADIOFREQUENCY TAG IDENTIFICATION;  Surgeon: Lane Shope, MD;  Location: [facility] ORS;  Service: General;  Laterality:  Left;   BREAST LUMPECTOMY WITH SENTINEL LYMPH NODE BIOPSY Left 08/12/2019   Procedure: BREAST LUMPECTOMY WITH SENTINEL LYMPH NODE BX;  Surgeon: Lane Shope, MD;  Location: [facility] ORS;  Service: General;  Laterality: Left;   broken arm Left 1994   wrist fracture surgery, left.  metal has been replaced.   CARDIOVASCULAR STRESS TEST     2010   COLONOSCOPY  2010   COLONOSCOPY  07/21/2013   PHX OF CP/REPEAT 69YRS/[place]   ESOPHAGOGASTRODUODENOSCOPY (EGD) WITH PROPOFOL  N/A 09/22/2018   Procedure: ESOPHAGOGASTRODUODENOSCOPY (EGD) WITH PROPOFOL ;  Surgeon: Toledo, Ladell POUR, MD;  Location: [facility] ENDOSCOPY;  Service: Gastroenterology;  Laterality: N/A;  Patient to have Rapid COVID-19 day of procedure Nan approved this)   EYE SURGERY Bilateral    cataract surgery with lens implant   FOREIGN BODY REMOVAL N/A 12/09/2019   Procedure: FOREIGN BODY REMOVAL ADULT;  Surgeon: Kathlynn Sharper, MD;  Location: [facility] ORS;  Service: Orthopedics;  Laterality:  N/A;   INCISION AND DRAINAGE Left 12/09/2019   Procedure: INCISION AND DRAINAGE;  Surgeon: Kathlynn Sharper, MD;  Location: [facility] ORS;  Service: Orthopedics;  Laterality: Left;   LACERATION REPAIR Bilateral 12/09/2019   Procedure: REPAIR MULTIPLE LACERATIONS;  Surgeon: Kathlynn Sharper, MD;  Location: [facility] ORS;  Service: Orthopedics;  Laterality: Bilateral;   LUMBAR LAMINECTOMY/DECOMPRESSION MICRODISCECTOMY  11/18/2011   Procedure: LUMBAR LAMINECTOMY/DECOMPRESSION MICRODISCECTOMY 2 LEVELS;  Surgeon: Fairy Levels, MD;  Location: [facility];  Service: Neurosurgery;  Laterality: N/A;  Lumbar Three-Four,  Lumbar Four-Five Decompressive Laminectomy   MALONEY DILATION N/A 09/22/2018   Procedure: MALONEY DILATION;  Surgeon: Toledo, Ladell POUR, MD;  Location: [facility] ENDOSCOPY;  Service: Gastroenterology;  Laterality: N/A;   OVARIAN CYST SURGERY     POSTERIOR FUSION LUMBAR SPINE  2016   TOTAL THYROIDECTOMY     TRANSTHORACIC ECHOCARDIOGRAM  2010   upper and lower GI  2010    Social  History   Socioeconomic History   Marital status: Significant Other    Spouse name: Not on file   Number of children: 2   Years of education: Not on file   Highest education level: Not on file  Occupational History   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   Social Drivers of Health   Tobacco Use: Low Risk (04/20/2024)   Patient History    Smoking Tobacco Use: Never    Smokeless Tobacco Use: Never    Passive Exposure: Never  Financial Resource Strain: Low Risk (01/07/2023)   Overall Financial Resource Strain (CARDIA)    Difficulty of Paying Living Expenses: Not hard at all  Food Insecurity: No Food Insecurity (02/03/2024)   Epic    Worried About Programme Researcher, Broadcasting/film/video in the Last Year: Never true    Ran Out of Food in the Last Year: Never true  Transportation Needs: No Transportation Needs (02/03/2024)   Epic    Lack of Transportation (Medical): No    Lack of Transportation (Non-Medical): No  Physical Activity: Inactive (02/03/2024)   Exercise Vital Sign    Days of Exercise per Week: 0 days    Minutes of Exercise per Session: 0 min  Stress: No Stress Concern Present (02/03/2024)   [employer] of Occupational Health - Occupational Stress Questionnaire    Feeling of Stress: Not at all  Social Connections: Moderately Isolated (02/03/2024)   Social Connection and Isolation Panel    Frequency of Communication with Friends and Family: More than three times a week    Frequency of Social Gatherings with  Friends and Family: More than three times a week    Attends Religious Services: Never    Database Administrator or Organizations: No    Attends Banker Meetings: Never    Marital Status: Living with partner  Intimate Partner Violence: Not At Risk (02/03/2024)   Epic    Fear of Current or Ex-Partner: No    Emotionally Abused: No  Physically Abused: No    Sexually Abused: No  Depression (PHQ2-9): Low Risk (04/20/2024)   Depression (PHQ2-9)    PHQ-2 Score: 0  Alcohol Screen: Low Risk (01/07/2023)   Alcohol Screen    Last Alcohol Screening Score (AUDIT): 0  Housing: Unknown (04/06/2024)   [facility] from [facility]   Epic    Unable to Pay for Housing in the Last Year: Not on file    Number of Times Moved in the Last Year: Not on file    At any time in the past 12 months, were you homeless or living in a shelter (including now)?: No  Utilities: Not At Risk (02/03/2024)   Epic    Threatened with loss of utilities: No  Health Literacy: Adequate Health Literacy (02/03/2024)   B1300 Health Literacy    Frequency of need for help with medical instructions: Never    Family History  Problem Relation Age of Onset   Breast cancer Mother 20   Thyroid  cancer Mother    Pancreatic cancer Brother    Pancreatic cancer Sister    Suicidality Father    Prostate cancer Brother    Colon cancer Brother    Breast cancer Sister        x 2 times   Heart attack Sister    Ovarian cancer Maternal Aunt    Breast cancer Paternal Aunt    Lung cancer Paternal Uncle    Breast cancer Other    Colon cancer Maternal Aunt    Pancreatic cancer Maternal Aunt     Current Medications[2]  Physical exam:  Vitals:   04/20/24 1431  BP: 129/88  Pulse: 81  Resp: 18  Temp: (!) 96.6 F (35.9 C)  TempSrc: Tympanic  SpO2: 100%  Weight: 176 lb 9.6 oz (80.1 kg)  Height: 5' 3 (1.6 m)   Physical Exam Cardiovascular:     Rate and Rhythm: Normal rate and regular rhythm.     Heart  sounds: Normal heart sounds.  Pulmonary:     Effort: Pulmonary effort is normal.     Breath sounds: Normal breath sounds.  Skin:    General: Skin is warm and dry.  Neurological:     Mental Status: She is alert and oriented to person, place, and time.    Breast exam was performed in seated and lying down position. Patient is status post left lumpectomy with a well-healed surgical scar. No evidence of any palpable masses. No evidence of axillary adenopathy. No evidence of any palpable masses or lumps in the right breast. No evidence of right axillary adenopathy   I have personally reviewed labs listed below:    Latest Ref Rng & Units 11/05/2023   10:17 AM  CMP  Total Protein 6.0 - 8.5 g/dL 6.5   Total Bilirubin 0.0 - 1.2 mg/dL 0.5   Alkaline Phos 44 - 121 IU/L 87   AST 0 - 40 IU/L 23   ALT 0 - 32 IU/L 16       Latest Ref Rng & Units 10/19/2023   12:35 PM  CBC  WBC 4.0 - 10.5 K/uL 4.5   Hemoglobin 12.0 - 15.0 g/dL 88.9   Hematocrit 63.9 - 46.0 % 35.5   Platelets 150 - 400 K/uL 165      Assessment and plan- Patient is a 84 y.o. female with history of stage I left breast cancer ER/PR positive HER2 negative status postlumpectomy and adjuvant radiation therapy presently on Arimidex  here for routine follow-up  Clinically patient is doing well with no concerning signs and symptoms of recurrence based on today's exam.  She will be completing 5 years of endocrine therapy in October 2026 following which she can stop taking Arimidex .  She has baseline osteoporosis for which she gets Prolia  through endocrinology.  Her mammogram from November 2025 was unremarkable.  I will see her back in 6 months no labs Assessment & Plan      Visit Diagnosis 1. Encounter for follow-up surveillance of breast cancer   2. Visit for monitoring Arimidex  therapy   3. High risk medication use      Dr. Annah Skene, MD, MPH [facility] [phone] 04/20/2024 3:06  PM                   [1]  Allergies Allergen Reactions   Flagyl [Metronidazole Hcl] Swelling    Swelling in face, mouth, throat, palms of feet and hands.   Furosemide Itching    Made side of mouth irritated and puffy.  The rim inside her lip swelled up   Macrodantin [Nitrofurantoin Macrocrystal] Anaphylaxis, Hives and Swelling   Penicillins Anaphylaxis and Swelling    PATIENT HAD A PCN REACTION WITH IMMEDIATE RASH, FACIAL/TONGUE/THROAT SWELLING, SOB, OR LIGHTHEADEDNESS WITH HYPOTENSION:  #  #  #  YES  #  #  #   Has patient had a PCN reaction causing severe rash involving mucus membranes or skin necrosis: No Has patient had a PCN reaction that required hospitalization: No Has patient had a PCN reaction occurring within the last 10 years: No If all of the above answers are NO, then may proceed with Cephalosporin use.    Celebrex [Celecoxib] Swelling    Swelling in joints.  Did not agree with her. Did not help her either   Other     Pt says it was recently discovered she allergic to an ingredient in local anesthesia but she can't recall the name. (epinephrine  and the additive used for the biopsy. Gets very nervous. Feels internally shaking. 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 (2018 and 2021)  [2]  Current Outpatient Medications:    acetaminophen  (TYLENOL ) 650 MG CR tablet, Take 1,300 mg by mouth every 8 (eight) hours as needed for pain., Disp: , Rfl:    anastrozole  (ARIMIDEX ) 1 MG tablet, TAKE 1 TABLET EVERY DAY, Disp: 90 tablet, Rfl: 3   Ascorbic Acid (VITAMIN C) 1000 MG tablet, Take 1,000 mg by mouth daily., Disp: , Rfl:    atorvastatin  (LIPITOR) 40 MG tablet, Take 1 tablet (40 mg total) by mouth at bedtime., Disp: 100 tablet, Rfl: 0   Calcium  Carbonate-Vitamin D  600-400 MG-UNIT tablet, Take 2 tablets by mouth daily., Disp: , Rfl:    chlorthalidone  (HYGROTON ) 50 MG tablet, Take 1 tablet by mouth 2 (two) times daily., Disp: , Rfl:    cholecalciferol   (VITAMIN D3) 25 MCG (1000 UNIT) tablet, Take 1,000 Units by mouth daily., Disp: , Rfl:    denosumab  (PROLIA ) 60 MG/ML SOSY injection, Inject 60 mg into the skin every 6 (six) months., Disp: , Rfl:    diclofenac  Sodium (VOLTAREN ) 1 % GEL, Apply 2 g topically 4 (four) times daily., Disp: , Rfl:    doxylamine , Sleep, (UNISOM ) 25 MG tablet, Take 25 mg by mouth., Disp: , Rfl:    fexofenadine (ALLEGRA) 180 MG tablet, Take 180 mg by mouth daily., Disp: , Rfl:    fluticasone  (FLONASE ) 50 MCG/ACT nasal spray, USE  2 SPRAYS IN EACH NOSTRIL EVERY DAY AS NEEDED FOR ALLERGIES, Disp: 16 g, Rfl: 3   gabapentin  (NEURONTIN ) 300 MG capsule, Take 300 mg by mouth 2 (two) times daily., Disp: , Rfl:    GLUCOSAMINE-CHONDROITIN PO, Take 1 tablet by mouth daily. , Disp: , Rfl:    irbesartan  (AVAPRO ) 300 MG tablet, TAKE 1 TABLET EVERY DAY, Disp: 90 tablet, Rfl: 0   levothyroxine  (SYNTHROID ) 112 MCG tablet, Take 112 mcg by mouth daily., Disp: , Rfl:    Multiple Vitamin (MULTIVITAMIN WITH MINERALS) TABS, Take 1 tablet by mouth daily., Disp: , Rfl:    omeprazole  (PRILOSEC) 40 MG capsule, Take 1 capsule (40 mg total) by mouth daily., Disp: 90 capsule, Rfl: 0   sertraline  (ZOLOFT ) 50 MG tablet, Take 1 tablet (50 mg total) by mouth daily., Disp: 90 tablet, Rfl: 0   vitamin E  180 MG (400 UNITS) capsule, Take 400 Units by mouth daily., Disp: , Rfl:   "

## 2024-04-20 NOTE — Progress Notes (Signed)
 Patient doing okay; no new or acute concerns at this time.

## 2024-05-09 ENCOUNTER — Ambulatory Visit: Admitting: Student

## 2024-10-18 ENCOUNTER — Inpatient Hospital Stay: Payer: Medicare (Managed Care) | Admitting: Oncology

## 2025-02-23 ENCOUNTER — Ambulatory Visit
